# Patient Record
Sex: Female | Born: 1990 | Race: White | Hispanic: No | Marital: Single | Smoking: Current every day smoker
Health system: Southern US, Community
[De-identification: ages and names within clinical notes are randomized; demographics above are authoritative.]

## PROBLEM LIST (undated history)

## (undated) ENCOUNTER — Emergency Department (HOSPITAL_COMMUNITY): Payer: Managed Care, Other (non HMO) | Source: Home / Self Care

## (undated) VITALS — BP 90/61 | HR 84 | Temp 97.4°F | Resp 16 | Ht 62.0 in | Wt 106.0 lb

## (undated) DIAGNOSIS — F319 Bipolar disorder, unspecified: Secondary | ICD-10-CM

## (undated) DIAGNOSIS — G8929 Other chronic pain: Secondary | ICD-10-CM

## (undated) DIAGNOSIS — N200 Calculus of kidney: Secondary | ICD-10-CM

## (undated) DIAGNOSIS — Z765 Malingerer [conscious simulation]: Secondary | ICD-10-CM

## (undated) DIAGNOSIS — K589 Irritable bowel syndrome without diarrhea: Secondary | ICD-10-CM

## (undated) DIAGNOSIS — N83209 Unspecified ovarian cyst, unspecified side: Secondary | ICD-10-CM

## (undated) DIAGNOSIS — B2790 Infectious mononucleosis, unspecified without complication: Secondary | ICD-10-CM

## (undated) DIAGNOSIS — R87619 Unspecified abnormal cytological findings in specimens from cervix uteri: Secondary | ICD-10-CM

## (undated) DIAGNOSIS — N3010 Interstitial cystitis (chronic) without hematuria: Secondary | ICD-10-CM

## (undated) DIAGNOSIS — N730 Acute parametritis and pelvic cellulitis: Secondary | ICD-10-CM

## (undated) DIAGNOSIS — M1990 Unspecified osteoarthritis, unspecified site: Secondary | ICD-10-CM

## (undated) DIAGNOSIS — R102 Pelvic and perineal pain: Secondary | ICD-10-CM

## (undated) DIAGNOSIS — IMO0002 Reserved for concepts with insufficient information to code with codable children: Secondary | ICD-10-CM

## (undated) DIAGNOSIS — D649 Anemia, unspecified: Secondary | ICD-10-CM

## (undated) DIAGNOSIS — B977 Papillomavirus as the cause of diseases classified elsewhere: Secondary | ICD-10-CM

## (undated) DIAGNOSIS — M797 Fibromyalgia: Secondary | ICD-10-CM

## (undated) DIAGNOSIS — F329 Major depressive disorder, single episode, unspecified: Secondary | ICD-10-CM

## (undated) DIAGNOSIS — F419 Anxiety disorder, unspecified: Secondary | ICD-10-CM

## (undated) DIAGNOSIS — A0472 Enterocolitis due to Clostridium difficile, not specified as recurrent: Secondary | ICD-10-CM

## (undated) DIAGNOSIS — A749 Chlamydial infection, unspecified: Secondary | ICD-10-CM

## (undated) DIAGNOSIS — F32A Depression, unspecified: Secondary | ICD-10-CM

## (undated) HISTORY — PX: LAPAROSCOPIC CHOLECYSTECTOMY: SUR755

## (undated) HISTORY — DX: Depression, unspecified: F32.A

## (undated) HISTORY — PX: TONSILLECTOMY: SUR1361

## (undated) HISTORY — PX: WISDOM TOOTH EXTRACTION: SHX21

## (undated) HISTORY — DX: Major depressive disorder, single episode, unspecified: F32.9

## (undated) HISTORY — PX: INNER EAR SURGERY: SHX679

## (undated) HISTORY — PX: COLPOSCOPY: SHX161

## (undated) HISTORY — DX: Unspecified osteoarthritis, unspecified site: M19.90

## (undated) HISTORY — PX: KIDNEY STONE SURGERY: SHX686

## (undated) HISTORY — DX: Anemia, unspecified: D64.9

## (undated) HISTORY — DX: Reserved for concepts with insufficient information to code with codable children: IMO0002

## (undated) HISTORY — DX: Anxiety disorder, unspecified: F41.9

---
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
## 2008-08-26 ENCOUNTER — Encounter: Payer: Self-pay | Admitting: Internal Medicine

## 2008-09-15 ENCOUNTER — Encounter: Payer: Self-pay | Admitting: Internal Medicine

## 2009-03-09 ENCOUNTER — Encounter: Payer: Self-pay | Admitting: Internal Medicine

## 2009-03-09 LAB — CONVERTED CEMR LAB
HCT: 39.3 %
Hemoglobin: 13.2 g/dL
WBC, blood: 5.8 10*3/uL

## 2009-05-18 ENCOUNTER — Ambulatory Visit: Payer: Self-pay | Admitting: Internal Medicine

## 2009-05-18 ENCOUNTER — Telehealth: Payer: Self-pay | Admitting: Internal Medicine

## 2009-05-18 DIAGNOSIS — K589 Irritable bowel syndrome without diarrhea: Secondary | ICD-10-CM | POA: Insufficient documentation

## 2009-05-18 DIAGNOSIS — R634 Abnormal weight loss: Secondary | ICD-10-CM | POA: Insufficient documentation

## 2009-05-18 DIAGNOSIS — F411 Generalized anxiety disorder: Secondary | ICD-10-CM | POA: Insufficient documentation

## 2009-05-26 ENCOUNTER — Telehealth (INDEPENDENT_AMBULATORY_CARE_PROVIDER_SITE_OTHER): Payer: Self-pay | Admitting: *Deleted

## 2009-05-26 LAB — CONVERTED CEMR LAB
ALT: 25 units/L (ref 0–35)
BUN: 11 mg/dL (ref 6–23)
Basophils Absolute: 0 10*3/uL (ref 0.0–0.1)
Bilirubin, Direct: 0.1 mg/dL (ref 0.0–0.3)
CO2: 24 meq/L (ref 19–32)
Eosinophils Absolute: 0.2 10*3/uL (ref 0.0–0.7)
Free T4: 0.9 ng/dL (ref 0.6–1.6)
GFR calc non Af Amer: 113.27 mL/min (ref >60)
Glucose, Bld: 77 mg/dL (ref 70–99)
HCT: 35.3 % — ABNORMAL LOW (ref 36.0–46.0)
Iron: 102 ug/dL (ref 42–145)
Lymphs Abs: 2.3 10*3/uL (ref 0.7–4.0)
MCHC: 33.3 g/dL (ref 30.0–36.0)
MCV: 96.6 fL (ref 78.0–100.0)
Monocytes Absolute: 0.3 10*3/uL (ref 0.1–1.0)
Platelets: 188 10*3/uL (ref 150.0–400.0)
Potassium: 3.6 meq/L (ref 3.5–5.1)
RDW: 11.4 % — ABNORMAL LOW (ref 11.5–14.6)
Total Bilirubin: 1.2 mg/dL (ref 0.3–1.2)

## 2009-05-27 ENCOUNTER — Encounter: Payer: Self-pay | Admitting: Internal Medicine

## 2009-09-02 ENCOUNTER — Emergency Department (HOSPITAL_COMMUNITY): Admission: EM | Admit: 2009-09-02 | Discharge: 2009-09-02 | Payer: Self-pay | Admitting: Emergency Medicine

## 2010-01-20 ENCOUNTER — Emergency Department (HOSPITAL_COMMUNITY): Admission: EM | Admit: 2010-01-20 | Discharge: 2010-01-21 | Payer: Self-pay | Admitting: Emergency Medicine

## 2010-02-03 ENCOUNTER — Encounter: Payer: Self-pay | Admitting: Internal Medicine

## 2010-02-10 ENCOUNTER — Emergency Department (HOSPITAL_COMMUNITY): Admission: EM | Admit: 2010-02-10 | Discharge: 2010-02-11 | Payer: Self-pay | Admitting: Pediatric Emergency Medicine

## 2010-03-09 ENCOUNTER — Emergency Department (HOSPITAL_COMMUNITY): Admission: EM | Admit: 2010-03-09 | Discharge: 2010-03-10 | Payer: Self-pay | Admitting: Emergency Medicine

## 2010-03-10 ENCOUNTER — Inpatient Hospital Stay (HOSPITAL_COMMUNITY): Admission: AD | Admit: 2010-03-10 | Discharge: 2010-03-11 | Payer: Self-pay | Admitting: Psychiatry

## 2010-03-10 ENCOUNTER — Ambulatory Visit: Payer: Self-pay | Admitting: Psychiatry

## 2010-03-17 ENCOUNTER — Ambulatory Visit (HOSPITAL_COMMUNITY): Payer: Self-pay | Admitting: Psychiatry

## 2010-04-01 ENCOUNTER — Ambulatory Visit (HOSPITAL_COMMUNITY): Payer: Self-pay | Admitting: Psychiatry

## 2010-04-06 ENCOUNTER — Ambulatory Visit (HOSPITAL_COMMUNITY): Payer: Self-pay | Admitting: Psychiatry

## 2010-04-11 ENCOUNTER — Ambulatory Visit (HOSPITAL_COMMUNITY): Payer: Self-pay | Admitting: Psychiatry

## 2010-04-19 ENCOUNTER — Ambulatory Visit (HOSPITAL_COMMUNITY): Payer: Self-pay | Admitting: Psychiatry

## 2010-04-22 ENCOUNTER — Ambulatory Visit (HOSPITAL_COMMUNITY): Payer: Self-pay | Admitting: Psychiatry

## 2010-04-25 ENCOUNTER — Ambulatory Visit (HOSPITAL_COMMUNITY): Payer: Self-pay | Admitting: Psychiatry

## 2010-04-26 ENCOUNTER — Emergency Department (HOSPITAL_COMMUNITY): Admission: EM | Admit: 2010-04-26 | Discharge: 2010-04-26 | Payer: Self-pay | Admitting: Emergency Medicine

## 2010-05-02 ENCOUNTER — Ambulatory Visit (HOSPITAL_COMMUNITY): Payer: Self-pay | Admitting: Psychiatry

## 2010-05-09 ENCOUNTER — Ambulatory Visit (HOSPITAL_COMMUNITY): Payer: Self-pay | Admitting: Psychiatry

## 2010-05-12 ENCOUNTER — Ambulatory Visit (HOSPITAL_COMMUNITY): Payer: Self-pay | Admitting: Psychiatry

## 2010-05-16 ENCOUNTER — Ambulatory Visit (HOSPITAL_COMMUNITY): Payer: Self-pay | Admitting: Psychiatry

## 2010-05-20 ENCOUNTER — Ambulatory Visit (HOSPITAL_COMMUNITY): Payer: Self-pay | Admitting: Psychiatry

## 2010-05-24 ENCOUNTER — Ambulatory Visit (HOSPITAL_COMMUNITY): Payer: Self-pay | Admitting: Psychiatry

## 2010-05-26 ENCOUNTER — Ambulatory Visit (HOSPITAL_COMMUNITY): Payer: Self-pay | Admitting: Psychiatry

## 2010-05-27 ENCOUNTER — Ambulatory Visit (HOSPITAL_COMMUNITY): Payer: Self-pay | Admitting: Psychiatry

## 2010-05-30 ENCOUNTER — Ambulatory Visit (HOSPITAL_COMMUNITY): Payer: Self-pay | Admitting: Psychiatry

## 2010-06-07 ENCOUNTER — Emergency Department (HOSPITAL_COMMUNITY): Admission: EM | Admit: 2010-06-07 | Discharge: 2010-06-08 | Payer: Self-pay | Admitting: Emergency Medicine

## 2010-06-07 ENCOUNTER — Ambulatory Visit (HOSPITAL_COMMUNITY): Payer: Self-pay | Admitting: Psychiatry

## 2010-06-09 ENCOUNTER — Ambulatory Visit (HOSPITAL_COMMUNITY): Payer: Self-pay | Admitting: Psychiatry

## 2010-06-14 ENCOUNTER — Ambulatory Visit (HOSPITAL_COMMUNITY): Payer: Self-pay | Admitting: Psychiatry

## 2010-06-16 ENCOUNTER — Ambulatory Visit (HOSPITAL_COMMUNITY): Payer: Self-pay | Admitting: Psychiatry

## 2010-06-20 ENCOUNTER — Ambulatory Visit (HOSPITAL_COMMUNITY): Payer: Self-pay | Admitting: Psychiatry

## 2010-06-21 ENCOUNTER — Ambulatory Visit (HOSPITAL_COMMUNITY): Payer: Self-pay | Admitting: Psychiatry

## 2010-06-23 ENCOUNTER — Ambulatory Visit (HOSPITAL_COMMUNITY): Payer: Self-pay | Admitting: Psychiatry

## 2010-07-04 ENCOUNTER — Ambulatory Visit: Payer: Self-pay | Admitting: Internal Medicine

## 2010-07-04 ENCOUNTER — Ambulatory Visit: Payer: Self-pay | Admitting: Cardiology

## 2010-07-04 DIAGNOSIS — R51 Headache: Secondary | ICD-10-CM | POA: Insufficient documentation

## 2010-07-04 DIAGNOSIS — R519 Headache, unspecified: Secondary | ICD-10-CM | POA: Insufficient documentation

## 2010-07-08 ENCOUNTER — Telehealth: Payer: Self-pay | Admitting: Internal Medicine

## 2010-07-15 ENCOUNTER — Ambulatory Visit (HOSPITAL_COMMUNITY): Payer: Self-pay | Admitting: Psychiatry

## 2010-07-17 ENCOUNTER — Emergency Department (HOSPITAL_COMMUNITY): Admission: EM | Admit: 2010-07-17 | Discharge: 2010-07-17 | Payer: Self-pay | Admitting: Emergency Medicine

## 2010-07-19 ENCOUNTER — Telehealth: Payer: Self-pay | Admitting: Internal Medicine

## 2010-07-25 ENCOUNTER — Ambulatory Visit (HOSPITAL_COMMUNITY): Payer: Self-pay | Admitting: Psychiatry

## 2010-08-15 ENCOUNTER — Emergency Department (HOSPITAL_COMMUNITY)
Admission: EM | Admit: 2010-08-15 | Discharge: 2010-08-16 | Payer: Self-pay | Source: Home / Self Care | Admitting: Emergency Medicine

## 2010-08-19 ENCOUNTER — Ambulatory Visit (HOSPITAL_COMMUNITY): Payer: Self-pay | Admitting: Psychiatry

## 2010-08-26 ENCOUNTER — Ambulatory Visit (HOSPITAL_COMMUNITY): Payer: Self-pay | Admitting: Psychiatry

## 2010-09-16 ENCOUNTER — Ambulatory Visit (HOSPITAL_COMMUNITY): Payer: Self-pay | Admitting: Psychiatry

## 2010-09-20 ENCOUNTER — Ambulatory Visit: Admit: 2010-09-20 | Payer: Self-pay | Admitting: Internal Medicine

## 2010-09-20 ENCOUNTER — Encounter: Payer: Self-pay | Admitting: Internal Medicine

## 2010-09-20 ENCOUNTER — Encounter (INDEPENDENT_AMBULATORY_CARE_PROVIDER_SITE_OTHER): Payer: Self-pay | Admitting: *Deleted

## 2010-09-20 ENCOUNTER — Emergency Department (HOSPITAL_COMMUNITY)
Admission: EM | Admit: 2010-09-20 | Discharge: 2010-09-20 | Payer: Self-pay | Source: Home / Self Care | Admitting: Emergency Medicine

## 2010-09-20 ENCOUNTER — Telehealth (INDEPENDENT_AMBULATORY_CARE_PROVIDER_SITE_OTHER): Payer: Self-pay | Admitting: *Deleted

## 2010-09-20 ENCOUNTER — Ambulatory Visit
Admission: RE | Admit: 2010-09-20 | Discharge: 2010-09-20 | Payer: Self-pay | Source: Home / Self Care | Attending: Internal Medicine | Admitting: Internal Medicine

## 2010-09-20 DIAGNOSIS — N390 Urinary tract infection, site not specified: Secondary | ICD-10-CM | POA: Insufficient documentation

## 2010-09-20 LAB — CONVERTED CEMR LAB
Ketones, urine, test strip: NEGATIVE
Nitrite: POSITIVE
RBC / HPF: >50 — AB (ref <3)
Specific Gravity, Urine: 1.02
Squamous Epithelial / LPF: NONE SEEN /lpf
Urobilinogen, UA: 0.2
WBC, UA: >50 cells/hpf — AB (ref <3)

## 2010-09-21 ENCOUNTER — Encounter: Payer: Self-pay | Admitting: Internal Medicine

## 2010-09-23 ENCOUNTER — Ambulatory Visit (HOSPITAL_COMMUNITY)
Admission: RE | Admit: 2010-09-23 | Discharge: 2010-09-23 | Payer: Self-pay | Source: Home / Self Care | Attending: Psychiatry | Admitting: Psychiatry

## 2010-09-27 ENCOUNTER — Emergency Department (HOSPITAL_COMMUNITY)
Admission: EM | Admit: 2010-09-27 | Discharge: 2010-09-27 | Payer: Self-pay | Source: Home / Self Care | Admitting: Emergency Medicine

## 2010-09-30 ENCOUNTER — Ambulatory Visit (HOSPITAL_COMMUNITY)
Admission: RE | Admit: 2010-09-30 | Discharge: 2010-09-30 | Payer: Self-pay | Source: Home / Self Care | Attending: Psychiatry | Admitting: Psychiatry

## 2010-10-04 ENCOUNTER — Ambulatory Visit (HOSPITAL_COMMUNITY): Admit: 2010-10-04 | Payer: Self-pay | Admitting: Psychiatry

## 2010-10-12 ENCOUNTER — Emergency Department (HOSPITAL_COMMUNITY)
Admission: EM | Admit: 2010-10-12 | Discharge: 2010-10-12 | Payer: Self-pay | Source: Home / Self Care | Admitting: Emergency Medicine

## 2010-10-14 ENCOUNTER — Telehealth: Payer: Self-pay | Admitting: Internal Medicine

## 2010-10-14 ENCOUNTER — Ambulatory Visit (HOSPITAL_COMMUNITY)
Admission: RE | Admit: 2010-10-14 | Discharge: 2010-10-14 | Payer: Self-pay | Source: Home / Self Care | Attending: Psychiatry | Admitting: Psychiatry
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
## 2010-10-18 NOTE — Progress Notes (Signed)
----   Converted from flag ---- ---- 07/08/2010 11:57 AM, Army Fossa CMA wrote: pts number has been disconnected  ---- 07/05/2010 1:13 PM, Jose E. Paz MD wrote: please check on the patient ------------------------------

## 2010-10-18 NOTE — Progress Notes (Signed)
Summary: NEUROLOGIC REFERRAL  Phone Note Outgoing Call   Call placed by: Magdalen Spatz [facility],  July 19, 2010 12:08 PM Call placed to: Specialist Summary of Call: IN REFERENCE TO NEUROLOGIC REFERRAL.......Marland KitchenPER MY CALL TO [facility], PT NOSHOWED FOR HER APPT WHICH WAS WITHIN THE 10 DAYS YOU REQUESTED.  ATTEMPTED TO CONTACT PT MULTIPLE TIMES, MESSAGE WAS LEFT ON MOTHERS VM, AND PT WAS ALSO NOTIFIED BY MAIL OF HER APPT.  TODAY, I TRIED TO CONTACT PT, HOME PHONE NOW DISCONNECTED & CELL HAS VM NOT ACTIVATED.     Initial call taken by: Magdalen Spatz [facility],  July 19, 2010 12:08 PM  Follow-up for Phone Call        noted, not much else we can do Follow-up by: [facility] E. Paz MD,  July 19, 2010 5:08 PM

## 2010-10-18 NOTE — Miscellaneous (Signed)
Summary: Vaccine Record/[facility]  Vaccine Record/[facility]   Imported By: Lanelle Bal 02/10/2010 10:34:10  _____________________________________________________________________  External Attachment:    Type:   Image     Comment:   External Document

## 2010-10-18 NOTE — Letter (Signed)
Summary: Records Dated 05-14-04 thru 06-02-08/[facility]  Records Dated 05-14-04 thru 06-02-08/[facility]   Imported By: Lanelle Bal 02/10/2010 10:38:21  _____________________________________________________________________  External Attachment:    Type:   Image     Comment:   External Document

## 2010-10-20 NOTE — Progress Notes (Signed)
Summary: ER advisement  Phone Note Call from Patient   Summary of Call: I spoke with patient and fiancee about her symptoms. He called noting that they were worse and everytime she vomits she passes out. He notes that she passed out at the pharmacy. I advised the fiancee that he should call 911 and have the patient taken to the ER. He then noted that she was up and moving around and put the patient on the phone. I advised the patient that she should go to the ER immediately for eval and more extensive testing than we could do here. Initial call taken by: Lucious Groves CMA,  September 20, 2010 4:11 PM  Follow-up for Phone Call        agree, please check on her today Jose E. Paz MD  September 21, 2010 1:13 PM of  Additional Follow-up for Phone Call Additional follow up Details #1::        Attempted to call the patient and home number is disconnected/not in service. Tried an old number in idx and spanish speaking man notified me that I had the wrong number. Additional Follow-up by: Lucious Groves CMA,  September 21, 2010 2:46 PM    Additional Follow-up for Phone Call Additional follow up Details #2::    In [facility] I do not see where pt was seen in the ER. Army Fossa CMA  September 21, 2010 3:36 PM  I checked again myself--- no records of any visit to the Er yesterday. unable to contact the patient Jose E. Paz MD  September 21, 2010 6:01 PM v

## 2010-10-20 NOTE — Letter (Signed)
Summary: Out of Work  [facility] [employer]  [address] [place], [place] [address]   Phone: [phone]  Fax: [phone]    September 20, 2010   Employee:  Vladimir Crofts    To Whom It May Concern:   For Medical reasons, please excuse the above named employee from work for the following dates:  Start:   September 20, 2010  End:   September 20, 2010  If you need additional information, please feel free to contact our office.         Sincerely,    Army Fossa CMA

## 2010-10-20 NOTE — Assessment & Plan Note (Signed)
Summary: UTI OR KIDNEY STONE///SPH   Vital Signs:  Patient profile:   20 year old female Menstrual status:  regular Weight:      94 pounds Temp:     98.5 degrees F oral Pulse rate:   72 / minute Pulse rhythm:   regular BP sitting:   120 / 70  (left arm) Cuff size:   regular  Vitals Entered By: Army Fossa CMA (September 20, 2010 2:49 PM) CC: UTI or Kidney Stone Comments very painful to urinate lower back pain  x 1 day [facility]    History of Present Illness: here w/ her fiance 1 day h/o severe dysuria ("feels like razor blades when i urinate") , lower abd pain, lower back pain urine darker than usual reports a h/o kidney stones  (hospital chart reviewed: no CTs or documentation found) she went to the ER today w/ above sx but decided came hear instead b/c the 2 hour wait time at the ER  Current Medications (verified): 1)  Bcp 2)  Depakote 500 Mg Tbec (Divalproex Sodium) .... Qd 3)  Promethazine Hcl 25 Mg Tabs (Promethazine Hcl) .... At Bedtime 4)  Ambien 10 Mg Tabs (Zolpidem Tartrate) .... At Bedtime As Needed 5)  Ketorolac Tromethamine 10 Mg Tabs (Ketorolac Tromethamine) .... One By Mouth Every 6 Hours As Needed For Headache 6)  Lamotrigine 100 Mg Tabs (Lamotrigine) .... Qd  Allergies (verified): 1)  ! Zithromax  Past History:  Past Medical History: Reviewed history from 07/04/2010 and no changes required. IBS Anxiety-- sees psych  G0  Past Surgical History: Reviewed history from 05/18/2009 and no changes required. 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 GI:  Denies diarrhea and vomiting; (+) nausea  last BM yesterday. GU:  + lower abd dyscomfort LMP--? 9 months  on Depo shots .  Physical  Exam  General:  alert and underweight appearing.  non toxic  Eyes:  not pale , no icteric  Lungs:  normal respiratory effort, no intercostal retractions, no accessory muscle use, and normal breath sounds.   Heart:  normal rate, regular rhythm, and no murmur.   Abdomen:  not distended and, soft, quite tender in the lower abdomen ( suprapubic> RLQ or LLQ). no mass or rebound. Mild to moderate tenderness to percussion at the CVA bilaterally   Impression & Recommendations:  Problem # 1:  UTI (ICD-599.0) Assessment New I am concerned about the intensity of tenderness to palpation in the lower abdomen however she has a very good story and a supportive UA for the diagnosis of UTI . Other  etiologies are much less likely ( kidney stone? Appendicitis?) Urine pregnancy test is negative Plan: Rest, fluids, levaquin , pyridium I checked and there is no interaction between the prescribed medications and Depakote, lamodrigine   Her updated medication list for this problem includes:    Levaquin 500 Mg Tabs (Levofloxacin) ..... One by mouth daily for one week    Pyridium 200 Mg Tabs (Phenazopyridine hcl) ..... One by mouth 3 times a day for 2 days  Orders: UA Dipstick w/o Micro (automated)  (81003) T-Urine Microscopic (16109-60454) T-Culture, Urine (09811-91478) Urine Pregnancy Test  (29562)  Complete Medication List: 1)  Bcp  2)  Depakote 500 Mg Tbec (Divalproex sodium) .Marland KitchenMarland KitchenMarland Kitchen  Qd 3)  Promethazine Hcl 25 Mg Tabs (Promethazine hcl) .... At bedtime 4)  Ambien 10 Mg Tabs (Zolpidem tartrate) .... At bedtime as needed 5)  Ketorolac Tromethamine 10 Mg Tabs (Ketorolac tromethamine) .... One by mouth every 6 hours as needed for headache 6)  Lamotrigine 100 Mg Tabs (Lamotrigine) .... Qd 7)  Levaquin 500 Mg Tabs (Levofloxacin) .... One by mouth daily for one week 8)  Pyridium 200 Mg Tabs (Phenazopyridine hcl) .... One by mouth 3 times a day for 2 days  Patient Instructions: 1)  rest 2)  Fluids 3)   Her readings for 3 days 4)  Levaquin for one week 5)  For pain take Tylenol 6)  Go immediately to the ER or call us   if the symptoms are getting worse , you have pain in the right lower side of the abdomen, you have high fever or you are not getting better in the next 48 hours Prescriptions: PYRIDIUM 200 MG TABS (PHENAZOPYRIDINE HCL) One by mouth 3 times a day for 2 days  #6 x 0   Entered and Authorized by:   Nolon Rod. Quin Mathenia MD   Signed by:   Nolon Rod. Saniyah Mondesir MD on 09/20/2010   Method used:   Print then Give to Patient   RxID:   [id number] LEVAQUIN 500 MG TABS (LEVOFLOXACIN) one by mouth daily for one week  #7 x 0   Entered and Authorized by:   Nolon Rod. Taeden Geller MD   Signed by:   Nolon Rod. Havanna Groner MD on 09/20/2010   Method used:   Print then Give to Patient   RxID:   [id number]    Orders Added: 1)  UA Dipstick w/o Micro (automated)  [81003] 2)  T-Urine Microscopic [84696-29528] 3)  T-Culture, Urine [41324-40102] 4)  Urine Pregnancy Test  [81025] 5)  Est. Patient Level IV [72536]    Laboratory Results   Urine Tests    Routine Urinalysis   Color: orange Appearance: Cloudy Glucose: negative   (Normal Range: Negative) Bilirubin: negative   (Normal Range: Negative) Ketone: negative   (Normal Range: Negative) Spec. Gravity: 1.020   (Normal Range: 1.003-1.035) Blood: large   (Normal Range: Negative) pH: 7.5   (Normal Range: 5.0-8.0) Protein: trace   (Normal Range: Negative) Urobilinogen: 0.2   (Normal Range: 0-1) Nitrite: positive   (Normal Range: Negative) Leukocyte Esterace: large   (Normal Range: Negative)    Urine HCG: negative Comments: Army Fossa CMA  September 20, 2010 2:53 PM

## 2010-10-21 ENCOUNTER — Ambulatory Visit (HOSPITAL_COMMUNITY): Admit: 2010-10-21 | Payer: Self-pay | Admitting: Psychiatry

## 2010-10-21 ENCOUNTER — Encounter (HOSPITAL_COMMUNITY): Payer: Self-pay | Admitting: Psychiatry
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
## 2010-11-09 ENCOUNTER — Emergency Department (INDEPENDENT_AMBULATORY_CARE_PROVIDER_SITE_OTHER): Payer: Managed Care, Other (non HMO)

## 2010-11-09 ENCOUNTER — Emergency Department (HOSPITAL_BASED_OUTPATIENT_CLINIC_OR_DEPARTMENT_OTHER)
Admission: EM | Admit: 2010-11-09 | Discharge: 2010-11-09 | Disposition: A | Discharge status: Home / Self Care | Payer: Managed Care, Other (non HMO) | Attending: Emergency Medicine | Admitting: Emergency Medicine

## 2010-11-09 DIAGNOSIS — M549 Dorsalgia, unspecified: Secondary | ICD-10-CM | POA: Insufficient documentation

## 2010-11-09 DIAGNOSIS — N23 Unspecified renal colic: Secondary | ICD-10-CM | POA: Insufficient documentation

## 2010-11-09 DIAGNOSIS — R109 Unspecified abdominal pain: Secondary | ICD-10-CM

## 2010-11-09 DIAGNOSIS — F3289 Other specified depressive episodes: Secondary | ICD-10-CM | POA: Insufficient documentation

## 2010-11-09 DIAGNOSIS — F329 Major depressive disorder, single episode, unspecified: Secondary | ICD-10-CM | POA: Insufficient documentation

## 2010-11-09 DIAGNOSIS — N390 Urinary tract infection, site not specified: Secondary | ICD-10-CM | POA: Insufficient documentation

## 2010-11-09 DIAGNOSIS — R112 Nausea with vomiting, unspecified: Secondary | ICD-10-CM | POA: Insufficient documentation

## 2010-11-09 DIAGNOSIS — R197 Diarrhea, unspecified: Secondary | ICD-10-CM | POA: Insufficient documentation

## 2010-11-09 DIAGNOSIS — N1330 Unspecified hydronephrosis: Secondary | ICD-10-CM

## 2010-11-09 LAB — CBC
HCT: 33.8 % — ABNORMAL LOW (ref 36.0–46.0)
Hemoglobin: 12 g/dL (ref 12.0–15.0)
MCH: 32.3 pg (ref 26.0–34.0)
MCHC: 35.5 g/dL (ref 30.0–36.0)
MCV: 90.9 fL (ref 78.0–100.0)
Platelets: 235 10*3/uL (ref 150–400)
RBC: 3.72 MIL/uL — ABNORMAL LOW (ref 3.87–5.11)
RDW: 11.5 % (ref 11.5–15.5)
WBC: 8.9 10*3/uL (ref 4.0–10.5)

## 2010-11-09 LAB — URINALYSIS, ROUTINE W REFLEX MICROSCOPIC
Bilirubin Urine: NEGATIVE
Ketones, ur: NEGATIVE mg/dL
Nitrite: POSITIVE — AB
Protein, ur: NEGATIVE mg/dL
Specific Gravity, Urine: 1.007 (ref 1.005–1.030)
Urine Glucose, Fasting: NEGATIVE mg/dL
Urobilinogen, UA: 1 mg/dL (ref 0.0–1.0)
pH: 6 (ref 5.0–8.0)

## 2010-11-09 LAB — BASIC METABOLIC PANEL
BUN: 18 mg/dL (ref 6–23)
CO2: 21 mEq/L (ref 19–32)
Calcium: 9.4 mg/dL (ref 8.4–10.5)
Chloride: 110 mEq/L (ref 96–112)
Creatinine, Ser: 0.8 mg/dL (ref 0.4–1.2)
GFR calc Af Amer: >60 mL/min (ref >60)
GFR calc non Af Amer: >60 mL/min (ref >60)
Glucose, Bld: 94 mg/dL (ref 70–99)
Potassium: 3.8 mEq/L (ref 3.5–5.1)
Sodium: 139 mEq/L (ref 135–145)

## 2010-11-09 LAB — URINE MICROSCOPIC-ADD ON

## 2010-11-09 LAB — DIFFERENTIAL
Basophils Absolute: 0 10*3/uL (ref 0.0–0.1)
Basophils Relative: 0 % (ref 0–1)
Eosinophils Absolute: 0.2 10*3/uL (ref 0.0–0.7)
Eosinophils Relative: 2 % (ref 0–5)
Lymphocytes Relative: 22 % (ref 12–46)
Lymphs Abs: 1.9 10*3/uL (ref 0.7–4.0)
Monocytes Absolute: 0.6 10*3/uL (ref 0.1–1.0)
Monocytes Relative: 7 % (ref 3–12)
Neutro Abs: 6.1 10*3/uL (ref 1.7–7.7)
Neutrophils Relative %: 69 % (ref 43–77)

## 2010-11-09 LAB — PREGNANCY, URINE: Preg Test, Ur: NEGATIVE

## 2010-11-10 LAB — URINE CULTURE
Colony Count: NO GROWTH
Culture  Setup Time: 201202230131
Culture: NO GROWTH

## 2010-11-28 LAB — CBC
HCT: 37.8 % (ref 36.0–46.0)
Hemoglobin: 12.6 g/dL (ref 12.0–15.0)
MCH: 31.7 pg (ref 26.0–34.0)
MCHC: 33.3 g/dL (ref 30.0–36.0)
MCV: 95.2 fL (ref 78.0–100.0)
Platelets: 226 10*3/uL (ref 150–400)
RBC: 3.97 MIL/uL (ref 3.87–5.11)
RDW: 12.1 % (ref 11.5–15.5)
WBC: 12.5 10*3/uL — ABNORMAL HIGH (ref 4.0–10.5)

## 2010-11-28 LAB — URINALYSIS, ROUTINE W REFLEX MICROSCOPIC
Glucose, UA: NEGATIVE mg/dL
Ketones, ur: >80 mg/dL — AB
Nitrite: POSITIVE — AB
Protein, ur: >300 mg/dL — AB
Specific Gravity, Urine: 1.021 (ref 1.005–1.030)
Urobilinogen, UA: 1 mg/dL (ref 0.0–1.0)
pH: 6 (ref 5.0–8.0)

## 2010-11-28 LAB — DIFFERENTIAL
Basophils Absolute: 0.1 10*3/uL (ref 0.0–0.1)
Basophils Relative: 0 % (ref 0–1)
Eosinophils Absolute: 0.1 10*3/uL (ref 0.0–0.7)
Eosinophils Relative: 1 % (ref 0–5)
Lymphocytes Relative: 18 % (ref 12–46)
Lymphs Abs: 2.2 10*3/uL (ref 0.7–4.0)
Monocytes Absolute: 0.9 10*3/uL (ref 0.1–1.0)
Monocytes Relative: 7 % (ref 3–12)
Neutro Abs: 9.2 10*3/uL — ABNORMAL HIGH (ref 1.7–7.7)
Neutrophils Relative %: 74 % (ref 43–77)

## 2010-11-28 LAB — URINE MICROSCOPIC-ADD ON

## 2010-11-28 LAB — POCT PREGNANCY, URINE: Preg Test, Ur: NEGATIVE

## 2010-11-28 LAB — POCT I-STAT, CHEM 8
BUN: 14 mg/dL (ref 6–23)
Calcium, Ion: 1.12 mmol/L (ref 1.12–1.32)
Chloride: 107 mEq/L (ref 96–112)
Creatinine, Ser: 0.9 mg/dL (ref 0.4–1.2)
Glucose, Bld: 94 mg/dL (ref 70–99)
HCT: 41 % (ref 36.0–46.0)
Hemoglobin: 13.9 g/dL (ref 12.0–15.0)
Potassium: 3.4 mEq/L — ABNORMAL LOW (ref 3.5–5.1)
Sodium: 141 mEq/L (ref 135–145)
TCO2: 23 mmol/L (ref 0–100)

## 2010-11-30 LAB — URINALYSIS, ROUTINE W REFLEX MICROSCOPIC
Bilirubin Urine: NEGATIVE
Glucose, UA: NEGATIVE mg/dL
Hgb urine dipstick: NEGATIVE
Ketones, ur: NEGATIVE mg/dL
Nitrite: NEGATIVE
Protein, ur: NEGATIVE mg/dL
Specific Gravity, Urine: 1.017 (ref 1.005–1.030)
Urobilinogen, UA: 1 mg/dL (ref 0.0–1.0)
pH: 7 (ref 5.0–8.0)

## 2010-11-30 LAB — GLUCOSE, CAPILLARY: Glucose-Capillary: 90 mg/dL (ref 70–99)

## 2010-11-30 LAB — BASIC METABOLIC PANEL
BUN: 9 mg/dL (ref 6–23)
Creatinine, Ser: 0.66 mg/dL (ref 0.4–1.2)
GFR calc Af Amer: >60 mL/min (ref >60)
GFR calc non Af Amer: >60 mL/min (ref >60)
Potassium: 3.3 mEq/L — ABNORMAL LOW (ref 3.5–5.1)

## 2010-11-30 LAB — CBC
Platelets: 186 10*3/uL (ref 150–400)
RDW: 12.5 % (ref 11.5–15.5)
WBC: 2.9 10*3/uL — ABNORMAL LOW (ref 4.0–10.5)

## 2010-12-01 LAB — POCT I-STAT, CHEM 8
Calcium, Ion: 1.17 mmol/L (ref 1.12–1.32)
Chloride: 107 mEq/L (ref 96–112)
Glucose, Bld: 104 mg/dL — ABNORMAL HIGH (ref 70–99)
HCT: 38 % (ref 36.0–46.0)
Hemoglobin: 12.9 g/dL (ref 12.0–15.0)

## 2010-12-01 LAB — URINALYSIS, ROUTINE W REFLEX MICROSCOPIC
Ketones, ur: 15 mg/dL — AB
Nitrite: NEGATIVE
Protein, ur: NEGATIVE mg/dL
Urobilinogen, UA: 0.2 mg/dL (ref 0.0–1.0)

## 2010-12-01 LAB — URINE CULTURE
Colony Count: NO GROWTH
Culture: NO GROWTH

## 2010-12-01 LAB — POCT PREGNANCY, URINE: Preg Test, Ur: NEGATIVE

## 2010-12-04 LAB — POCT I-STAT, CHEM 8
Calcium, Ion: 1.15 mmol/L (ref 1.12–1.32)
Chloride: 104 mEq/L (ref 96–112)
Glucose, Bld: 98 mg/dL (ref 70–99)
HCT: 39 % (ref 36.0–46.0)

## 2010-12-04 LAB — POCT PREGNANCY, URINE: Preg Test, Ur: NEGATIVE

## 2010-12-04 LAB — RAPID URINE DRUG SCREEN, HOSP PERFORMED
Amphetamines: NOT DETECTED
Benzodiazepines: POSITIVE — AB
Tetrahydrocannabinol: NOT DETECTED

## 2010-12-05 ENCOUNTER — Emergency Department (HOSPITAL_BASED_OUTPATIENT_CLINIC_OR_DEPARTMENT_OTHER)
Admission: EM | Admit: 2010-12-05 | Discharge: 2010-12-05 | Disposition: A | Discharge status: Home / Self Care | Payer: Managed Care, Other (non HMO) | Attending: Emergency Medicine | Admitting: Emergency Medicine

## 2010-12-05 DIAGNOSIS — K589 Irritable bowel syndrome without diarrhea: Secondary | ICD-10-CM | POA: Insufficient documentation

## 2010-12-05 DIAGNOSIS — R109 Unspecified abdominal pain: Secondary | ICD-10-CM | POA: Insufficient documentation

## 2010-12-05 DIAGNOSIS — R319 Hematuria, unspecified: Secondary | ICD-10-CM | POA: Insufficient documentation

## 2010-12-05 DIAGNOSIS — F17200 Nicotine dependence, unspecified, uncomplicated: Secondary | ICD-10-CM | POA: Insufficient documentation

## 2010-12-05 LAB — CBC
HCT: 35.8 % — ABNORMAL LOW (ref 36.0–46.0)
HCT: 33.6 % — ABNORMAL LOW (ref 36.0–46.0)
Hemoglobin: 12.2 g/dL (ref 12.0–15.0)
MCH: 31.8 pg (ref 26.0–34.0)
MCHC: 34.2 g/dL (ref 30.0–36.0)
MCHC: 33.6 g/dL (ref 30.0–36.0)
MCV: 95.6 fL (ref 78.0–100.0)
Platelets: 192 K/uL (ref 150–400)
RBC: 3.74 MIL/uL — ABNORMAL LOW (ref 3.87–5.11)
RDW: 12.1 % (ref 11.5–15.5)
RDW: 11.3 % — ABNORMAL LOW (ref 11.5–15.5)
WBC: 6.8 10*3/uL (ref 4.0–10.5)

## 2010-12-05 LAB — COMPREHENSIVE METABOLIC PANEL
ALT: 21 U/L (ref 0–35)
CO2: 22 mEq/L (ref 19–32)
Calcium: 9.2 mg/dL (ref 8.4–10.5)
Creatinine, Ser: 0.6 mg/dL (ref 0.4–1.2)
GFR calc non Af Amer: >60 mL/min (ref >60)
Glucose, Bld: 79 mg/dL (ref 70–99)
Sodium: 144 mEq/L (ref 135–145)
Total Protein: 7.8 g/dL (ref 6.0–8.3)

## 2010-12-05 LAB — DIFFERENTIAL
Basophils Absolute: 0 K/uL (ref 0.0–0.1)
Basophils Absolute: 0 10*3/uL (ref 0.0–0.1)
Basophils Relative: 1 % (ref 0–1)
Basophils Relative: 0 % (ref 0–1)
Eosinophils Absolute: 0.2 10*3/uL (ref 0.0–0.7)
Eosinophils Relative: 4 % (ref 0–5)
Eosinophils Relative: 1 % (ref 0–5)
Lymphocytes Relative: 50 % — ABNORMAL HIGH (ref 12–46)
Lymphocytes Relative: 23 % (ref 12–46)
Lymphs Abs: 3.4 10*3/uL (ref 0.7–4.0)
Monocytes Absolute: 0.5 K/uL (ref 0.1–1.0)
Monocytes Absolute: 0.5 10*3/uL (ref 0.1–1.0)
Monocytes Relative: 8 % (ref 3–12)
Monocytes Relative: 9 % (ref 3–12)
Neutro Abs: 2.6 K/uL (ref 1.7–7.7)
Neutrophils Relative %: 38 % — ABNORMAL LOW (ref 43–77)

## 2010-12-05 LAB — URINE MICROSCOPIC-ADD ON

## 2010-12-05 LAB — URINALYSIS, ROUTINE W REFLEX MICROSCOPIC
Glucose, UA: NEGATIVE mg/dL
Ketones, ur: NEGATIVE mg/dL
Nitrite: NEGATIVE
Nitrite: NEGATIVE
Protein, ur: 30 mg/dL — AB
Specific Gravity, Urine: 1.014 (ref 1.005–1.030)
Urobilinogen, UA: 1 mg/dL (ref 0.0–1.0)
pH: 7 (ref 5.0–8.0)
pH: 6 (ref 5.0–8.0)

## 2010-12-05 LAB — POCT I-STAT, CHEM 8
BUN: 17 mg/dL (ref 6–23)
Creatinine, Ser: 0.6 mg/dL (ref 0.4–1.2)
Glucose, Bld: 97 mg/dL (ref 70–99)
Hemoglobin: 12.6 g/dL (ref 12.0–15.0)
TCO2: 22 mmol/L (ref 0–100)

## 2010-12-06 LAB — CBC
Hemoglobin: 12.1 g/dL (ref 12.0–15.0)
Platelets: 197 10*3/uL (ref 150–400)
RDW: 12.5 % (ref 11.5–15.5)
WBC: 6.3 10*3/uL (ref 4.0–10.5)

## 2010-12-06 LAB — RAPID URINE DRUG SCREEN, HOSP PERFORMED
Amphetamines: NOT DETECTED
Cocaine: NOT DETECTED
Opiates: NOT DETECTED
Tetrahydrocannabinol: NOT DETECTED

## 2010-12-06 LAB — POCT I-STAT, CHEM 8
BUN: 18 mg/dL (ref 6–23)
Chloride: 107 mEq/L (ref 96–112)
Potassium: 3.7 mEq/L (ref 3.5–5.1)
Sodium: 140 mEq/L (ref 135–145)

## 2010-12-06 LAB — URINE MICROSCOPIC-ADD ON

## 2010-12-06 LAB — URINALYSIS, ROUTINE W REFLEX MICROSCOPIC
Bilirubin Urine: NEGATIVE
Glucose, UA: NEGATIVE mg/dL
Protein, ur: NEGATIVE mg/dL
Urobilinogen, UA: 1 mg/dL (ref 0.0–1.0)

## 2010-12-06 LAB — DIFFERENTIAL
Basophils Absolute: 0.1 10*3/uL (ref 0.0–0.1)
Lymphocytes Relative: 48 % — ABNORMAL HIGH (ref 12–46)
Neutro Abs: 2.6 10*3/uL (ref 1.7–7.7)

## 2010-12-07 LAB — URINE CULTURE
Colony Count: NO GROWTH
Culture  Setup Time: 201203200617
Culture: NO GROWTH

## 2010-12-19 LAB — COMPREHENSIVE METABOLIC PANEL
ALT: 9 U/L (ref 0–35)
AST: 23 U/L (ref 0–37)
Albumin: 3.5 g/dL (ref 3.5–5.2)
Alkaline Phosphatase: 51 U/L (ref 39–117)
BUN: 10 mg/dL (ref 6–23)
Chloride: 103 mEq/L (ref 96–112)
Potassium: 3.4 mEq/L — ABNORMAL LOW (ref 3.5–5.1)
Total Bilirubin: 0.7 mg/dL (ref 0.3–1.2)

## 2010-12-19 LAB — CBC
HCT: 34.3 % — ABNORMAL LOW (ref 36.0–46.0)
Platelets: 261 10*3/uL (ref 150–400)
WBC: 7.7 10*3/uL (ref 4.0–10.5)

## 2010-12-19 LAB — URINALYSIS, ROUTINE W REFLEX MICROSCOPIC
Bilirubin Urine: NEGATIVE
Hgb urine dipstick: NEGATIVE
Protein, ur: NEGATIVE mg/dL
Specific Gravity, Urine: 1.024 (ref 1.005–1.030)
Urobilinogen, UA: 1 mg/dL (ref 0.0–1.0)

## 2010-12-19 LAB — TSH: TSH: 2.008 u[IU]/mL (ref 0.700–6.400)

## 2010-12-19 LAB — URINE MICROSCOPIC-ADD ON

## 2011-01-20 ENCOUNTER — Emergency Department (HOSPITAL_COMMUNITY)
Admission: EM | Admit: 2011-01-20 | Discharge: 2011-01-21 | Disposition: A | Discharge status: Home / Self Care | Payer: Managed Care, Other (non HMO) | Attending: Emergency Medicine | Admitting: Emergency Medicine

## 2011-01-20 DIAGNOSIS — F3289 Other specified depressive episodes: Secondary | ICD-10-CM | POA: Insufficient documentation

## 2011-01-20 DIAGNOSIS — R110 Nausea: Secondary | ICD-10-CM | POA: Insufficient documentation

## 2011-01-20 DIAGNOSIS — R109 Unspecified abdominal pain: Secondary | ICD-10-CM | POA: Insufficient documentation

## 2011-01-20 DIAGNOSIS — K589 Irritable bowel syndrome without diarrhea: Secondary | ICD-10-CM | POA: Insufficient documentation

## 2011-01-20 DIAGNOSIS — Z79899 Other long term (current) drug therapy: Secondary | ICD-10-CM | POA: Insufficient documentation

## 2011-01-20 DIAGNOSIS — R319 Hematuria, unspecified: Secondary | ICD-10-CM | POA: Insufficient documentation

## 2011-01-20 DIAGNOSIS — F329 Major depressive disorder, single episode, unspecified: Secondary | ICD-10-CM | POA: Insufficient documentation

## 2011-01-21 ENCOUNTER — Emergency Department (HOSPITAL_COMMUNITY): Payer: Managed Care, Other (non HMO)

## 2011-01-21 LAB — CBC
MCH: 31.9 pg (ref 26.0–34.0)
MCV: 93.2 fL (ref 78.0–100.0)
Platelets: 194 10*3/uL (ref 150–400)
RDW: 11.8 % (ref 11.5–15.5)

## 2011-01-21 LAB — POCT I-STAT, CHEM 8
BUN: 11 mg/dL (ref 6–23)
Calcium, Ion: 1.1 mmol/L — ABNORMAL LOW (ref 1.12–1.32)
Chloride: 108 mEq/L (ref 96–112)
Creatinine, Ser: 0.7 mg/dL (ref 0.4–1.2)
Glucose, Bld: 87 mg/dL (ref 70–99)

## 2011-01-21 LAB — DIFFERENTIAL
Eosinophils Absolute: 0.1 10*3/uL (ref 0.0–0.7)
Eosinophils Relative: 2 % (ref 0–5)
Lymphs Abs: 2.4 10*3/uL (ref 0.7–4.0)
Monocytes Absolute: 0.5 10*3/uL (ref 0.1–1.0)
Monocytes Relative: 9 % (ref 3–12)

## 2011-01-21 LAB — URINALYSIS, ROUTINE W REFLEX MICROSCOPIC
Bilirubin Urine: NEGATIVE
Glucose, UA: NEGATIVE mg/dL
Hgb urine dipstick: NEGATIVE
Protein, ur: NEGATIVE mg/dL
Specific Gravity, Urine: 1.023 (ref 1.005–1.030)
Urobilinogen, UA: 0.2 mg/dL (ref 0.0–1.0)

## 2011-01-26 ENCOUNTER — Emergency Department (INDEPENDENT_AMBULATORY_CARE_PROVIDER_SITE_OTHER): Payer: Managed Care, Other (non HMO)

## 2011-01-26 ENCOUNTER — Emergency Department (HOSPITAL_BASED_OUTPATIENT_CLINIC_OR_DEPARTMENT_OTHER)
Admission: EM | Admit: 2011-01-26 | Discharge: 2011-01-26 | Disposition: A | Discharge status: Home / Self Care | Payer: Managed Care, Other (non HMO) | Attending: Emergency Medicine | Admitting: Emergency Medicine

## 2011-01-26 DIAGNOSIS — R109 Unspecified abdominal pain: Secondary | ICD-10-CM | POA: Insufficient documentation

## 2011-01-26 DIAGNOSIS — Z79899 Other long term (current) drug therapy: Secondary | ICD-10-CM | POA: Insufficient documentation

## 2011-01-26 DIAGNOSIS — K589 Irritable bowel syndrome without diarrhea: Secondary | ICD-10-CM | POA: Insufficient documentation

## 2011-01-26 DIAGNOSIS — R509 Fever, unspecified: Secondary | ICD-10-CM

## 2011-01-26 DIAGNOSIS — F3289 Other specified depressive episodes: Secondary | ICD-10-CM | POA: Insufficient documentation

## 2011-01-26 DIAGNOSIS — F329 Major depressive disorder, single episode, unspecified: Secondary | ICD-10-CM | POA: Insufficient documentation

## 2011-01-26 DIAGNOSIS — R112 Nausea with vomiting, unspecified: Secondary | ICD-10-CM

## 2011-01-26 LAB — URINALYSIS, ROUTINE W REFLEX MICROSCOPIC
Glucose, UA: NEGATIVE mg/dL
Hgb urine dipstick: NEGATIVE
Protein, ur: NEGATIVE mg/dL
pH: 6.5 (ref 5.0–8.0)

## 2011-01-26 LAB — CBC
HCT: 40.2 % (ref 36.0–46.0)
Hemoglobin: 13.9 g/dL (ref 12.0–15.0)
MCV: 92.2 fL (ref 78.0–100.0)
RBC: 4.36 MIL/uL (ref 3.87–5.11)
RDW: 11.2 % — ABNORMAL LOW (ref 11.5–15.5)
WBC: 5.6 10*3/uL (ref 4.0–10.5)

## 2011-01-26 LAB — RAPID URINE DRUG SCREEN, HOSP PERFORMED
Amphetamines: NOT DETECTED
Tetrahydrocannabinol: NOT DETECTED

## 2011-01-26 LAB — BASIC METABOLIC PANEL
GFR calc non Af Amer: >60 mL/min (ref >60)
Potassium: 4 mEq/L (ref 3.5–5.1)
Sodium: 137 mEq/L (ref 135–145)

## 2011-01-26 LAB — DIFFERENTIAL
Basophils Absolute: 0 10*3/uL (ref 0.0–0.1)
Eosinophils Relative: 3 % (ref 0–5)
Lymphocytes Relative: 44 % (ref 12–46)
Lymphs Abs: 2.5 10*3/uL (ref 0.7–4.0)
Neutro Abs: 2.4 10*3/uL (ref 1.7–7.7)

## 2011-01-26 LAB — PREGNANCY, URINE: Preg Test, Ur: NEGATIVE

## 2011-04-14 ENCOUNTER — Ambulatory Visit: Payer: Self-pay | Admitting: Internal Medicine

## 2011-05-12 ENCOUNTER — Emergency Department (HOSPITAL_COMMUNITY)
Admission: EM | Admit: 2011-05-12 | Discharge: 2011-05-12 | Disposition: A | Discharge status: Home / Self Care | Payer: No Typology Code available for payment source | Attending: Emergency Medicine | Admitting: Emergency Medicine

## 2011-05-12 ENCOUNTER — Emergency Department (HOSPITAL_COMMUNITY): Payer: No Typology Code available for payment source

## 2011-05-12 DIAGNOSIS — F3289 Other specified depressive episodes: Secondary | ICD-10-CM | POA: Insufficient documentation

## 2011-05-12 DIAGNOSIS — F329 Major depressive disorder, single episode, unspecified: Secondary | ICD-10-CM | POA: Insufficient documentation

## 2011-05-12 DIAGNOSIS — Z79899 Other long term (current) drug therapy: Secondary | ICD-10-CM | POA: Insufficient documentation

## 2011-05-12 DIAGNOSIS — R079 Chest pain, unspecified: Secondary | ICD-10-CM | POA: Insufficient documentation

## 2011-05-12 DIAGNOSIS — Y92410 Unspecified street and highway as the place of occurrence of the external cause: Secondary | ICD-10-CM | POA: Insufficient documentation

## 2011-05-12 DIAGNOSIS — S4350XA Sprain of unspecified acromioclavicular joint, initial encounter: Secondary | ICD-10-CM | POA: Insufficient documentation

## 2011-05-14 ENCOUNTER — Inpatient Hospital Stay (INDEPENDENT_AMBULATORY_CARE_PROVIDER_SITE_OTHER)
Admission: RE | Admit: 2011-05-14 | Discharge: 2011-05-14 | Disposition: A | Discharge status: Home / Self Care | Payer: No Typology Code available for payment source | Source: Ambulatory Visit | Attending: Family Medicine | Admitting: Family Medicine

## 2011-05-14 DIAGNOSIS — J069 Acute upper respiratory infection, unspecified: Secondary | ICD-10-CM

## 2011-05-14 DIAGNOSIS — R071 Chest pain on breathing: Secondary | ICD-10-CM

## 2011-05-26 ENCOUNTER — Emergency Department (HOSPITAL_BASED_OUTPATIENT_CLINIC_OR_DEPARTMENT_OTHER)
Admission: EM | Admit: 2011-05-26 | Discharge: 2011-05-27 | Disposition: A | Discharge status: Home / Self Care | Payer: Managed Care, Other (non HMO) | Attending: Emergency Medicine | Admitting: Emergency Medicine

## 2011-05-26 ENCOUNTER — Encounter: Payer: Self-pay | Admitting: *Deleted

## 2011-05-26 DIAGNOSIS — M25559 Pain in unspecified hip: Secondary | ICD-10-CM | POA: Insufficient documentation

## 2011-05-26 DIAGNOSIS — N898 Other specified noninflammatory disorders of vagina: Secondary | ICD-10-CM | POA: Insufficient documentation

## 2011-05-26 DIAGNOSIS — F17200 Nicotine dependence, unspecified, uncomplicated: Secondary | ICD-10-CM | POA: Insufficient documentation

## 2011-05-26 DIAGNOSIS — R109 Unspecified abdominal pain: Secondary | ICD-10-CM | POA: Insufficient documentation

## 2011-05-26 MED ORDER — SODIUM CHLORIDE 0.9 % IV BOLUS (SEPSIS)
1000.0000 mL | Freq: Once | INTRAVENOUS | Status: AC
  Administered 2011-05-27: 1000 mL via INTRAVENOUS
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
## 2011-05-27 ENCOUNTER — Emergency Department (INDEPENDENT_AMBULATORY_CARE_PROVIDER_SITE_OTHER): Payer: Managed Care, Other (non HMO)

## 2011-05-27 DIAGNOSIS — R197 Diarrhea, unspecified: Secondary | ICD-10-CM

## 2011-05-27 DIAGNOSIS — R109 Unspecified abdominal pain: Secondary | ICD-10-CM

## 2011-05-27 LAB — DIFFERENTIAL
Basophils Absolute: 0.1 10*3/uL (ref 0.0–0.1)
Eosinophils Absolute: 0.2 10*3/uL (ref 0.0–0.7)
Eosinophils Relative: 3 % (ref 0–5)
Monocytes Absolute: 0.6 10*3/uL (ref 0.1–1.0)
Neutrophils Relative %: 52 % (ref 43–77)
Smear Review: ADEQUATE

## 2011-05-27 LAB — URINALYSIS, ROUTINE W REFLEX MICROSCOPIC
Ketones, ur: NEGATIVE mg/dL
Leukocytes, UA: NEGATIVE
Nitrite: NEGATIVE
Specific Gravity, Urine: 1.018 (ref 1.005–1.030)
Urobilinogen, UA: 0.2 mg/dL (ref 0.0–1.0)
pH: 6.5 (ref 5.0–8.0)

## 2011-05-27 LAB — CBC
MCH: 32.5 pg (ref 26.0–34.0)
Platelets: ADEQUATE 10*3/uL (ref 150–400)
RBC: 3.91 MIL/uL (ref 3.87–5.11)
RDW: 11.5 % (ref 11.5–15.5)

## 2011-05-27 LAB — RAPID URINE DRUG SCREEN, HOSP PERFORMED
Amphetamines: NOT DETECTED
Barbiturates: NOT DETECTED
Benzodiazepines: POSITIVE — AB
Cocaine: NOT DETECTED
Tetrahydrocannabinol: NOT DETECTED

## 2011-05-27 LAB — COMPREHENSIVE METABOLIC PANEL
ALT: 13 U/L (ref 0–35)
AST: 14 U/L (ref 0–37)
Albumin: 3.8 g/dL (ref 3.5–5.2)
CO2: 25 mEq/L (ref 19–32)
Calcium: 9 mg/dL (ref 8.4–10.5)
Creatinine, Ser: 0.7 mg/dL (ref 0.50–1.10)
Sodium: 139 mEq/L (ref 135–145)
Total Protein: 7.5 g/dL (ref 6.0–8.3)

## 2011-05-27 MED ORDER — KETOROLAC TROMETHAMINE 30 MG/ML IJ SOLN
30.0000 mg | Freq: Once | INTRAMUSCULAR | Status: AC
  Administered 2011-05-27: 30 mg via INTRAVENOUS
  Filled 2011-05-27: qty 1

## 2011-05-27 MED ORDER — TRAMADOL HCL 50 MG PO TABS: 50.0000 mg | ORAL_TABLET | Freq: Four times a day (QID) | ORAL | Status: AC | PRN

## 2011-05-27 MED ORDER — TRAMADOL HCL 50 MG PO TABS
50.0000 mg | ORAL_TABLET | Freq: Once | ORAL | Status: AC
  Administered 2011-05-27: 50 mg via ORAL
  Filled 2011-05-27: qty 1

## 2011-05-27 MED ORDER — IOHEXOL 300 MG/ML  SOLN
100.0000 mL | Freq: Once | INTRAMUSCULAR | Status: AC | PRN
  Administered 2011-05-27: 100 mL via INTRAVENOUS
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
## 2011-05-28 ENCOUNTER — Other Ambulatory Visit (HOSPITAL_BASED_OUTPATIENT_CLINIC_OR_DEPARTMENT_OTHER): Payer: Self-pay | Admitting: Emergency Medicine

## 2011-05-28 ENCOUNTER — Inpatient Hospital Stay (HOSPITAL_BASED_OUTPATIENT_CLINIC_OR_DEPARTMENT_OTHER): Admit: 2011-05-28 | Payer: No Typology Code available for payment source

## 2011-05-28 DIAGNOSIS — R52 Pain, unspecified: Secondary | ICD-10-CM

## 2011-05-29 LAB — GC/CHLAMYDIA PROBE AMP, GENITAL: GC Probe Amp, Genital: NEGATIVE

## 2011-06-16 ENCOUNTER — Emergency Department (HOSPITAL_BASED_OUTPATIENT_CLINIC_OR_DEPARTMENT_OTHER)
Admission: EM | Admit: 2011-06-16 | Discharge: 2011-06-17 | Disposition: A | Discharge status: Home / Self Care | Payer: No Typology Code available for payment source | Attending: Emergency Medicine | Admitting: Emergency Medicine

## 2011-06-16 ENCOUNTER — Encounter (HOSPITAL_BASED_OUTPATIENT_CLINIC_OR_DEPARTMENT_OTHER): Payer: Self-pay

## 2011-06-16 DIAGNOSIS — R109 Unspecified abdominal pain: Secondary | ICD-10-CM | POA: Insufficient documentation

## 2011-06-16 DIAGNOSIS — R300 Dysuria: Secondary | ICD-10-CM | POA: Insufficient documentation

## 2011-06-16 LAB — URINALYSIS, ROUTINE W REFLEX MICROSCOPIC
Glucose, UA: NEGATIVE mg/dL
Protein, ur: NEGATIVE mg/dL
Specific Gravity, Urine: 1.017 (ref 1.005–1.030)

## 2011-06-16 LAB — PREGNANCY, URINE: Preg Test, Ur: NEGATIVE

## 2011-06-16 LAB — URINE MICROSCOPIC-ADD ON

## 2011-06-16 NOTE — ED Notes (Signed)
Pt. Reports she is here due to poss. Kidney stone.  Pt. Reports she has had nausea and vomiting and diarrhea tonight.  Pt. Speaking full sentences and skin is warm and dry.

## 2011-06-16 NOTE — ED Notes (Signed)
Pt states that she has severe abdominal pain.  Pt states that she has sought follow up since being seen here previously.  Pt saw obgyn and was told that pelvic exam was wnl.  Pt states that today she has severe diarrhea, nausea, vomiting, and right flank pain associated with dysuria.  Pt states that she feels like she is having another kidney stone.

## 2011-06-17 ENCOUNTER — Emergency Department (INDEPENDENT_AMBULATORY_CARE_PROVIDER_SITE_OTHER): Payer: No Typology Code available for payment source

## 2011-06-17 DIAGNOSIS — R300 Dysuria: Secondary | ICD-10-CM

## 2011-06-17 DIAGNOSIS — R112 Nausea with vomiting, unspecified: Secondary | ICD-10-CM

## 2011-06-17 DIAGNOSIS — R109 Unspecified abdominal pain: Secondary | ICD-10-CM

## 2011-06-17 MED ORDER — HYDROCODONE-ACETAMINOPHEN 5-500 MG PO TABS: 1.0000 | ORAL_TABLET | Freq: Four times a day (QID) | ORAL | Status: AC | PRN

## 2011-06-17 MED ORDER — KETOROLAC TROMETHAMINE 30 MG/ML IJ SOLN
30.0000 mg | Freq: Once | INTRAMUSCULAR | Status: AC
  Administered 2011-06-17: 30 mg via INTRAVENOUS
  Filled 2011-06-17: qty 1

## 2011-06-17 NOTE — ED Provider Notes (Signed)
History     CSN: [id number] Arrival date & time: 06/16/2011 11:37 PM  Chief Complaint  Patient presents with  . Abdominal Pain  . Dysuria    (Consider location/radiation/quality/duration/timing/severity/associated sxs/prior treatment) HPI Comments: Has history of kidney stones and has had surgery to remove.  Presents complaining of severe pain in the right lower abdomen and flank.  Denies dysuria, but feels chilled.  Patient is a 20 y.o. female presenting with abdominal pain and dysuria. The history is provided by the patient.  Abdominal Pain The primary symptoms of the illness include abdominal pain and dysuria. The primary symptoms of the illness do not include fever. The current episode started 2 days ago. The onset of the illness was sudden. The problem has been gradually worsening.  The dysuria is not associated with hematuria, frequency or urgency.  The patient states that she believes she is currently not pregnant. The patient has not had a change in bowel habit. Additional symptoms associated with the illness include chills. Symptoms associated with the illness do not include urgency, hematuria, frequency or back pain.  Dysuria  Associated symptoms include chills. Pertinent negatives include no frequency, no hematuria and no urgency.    Past Medical History  Diagnosis Date  . Renal disorder     Past Surgical History  Procedure Date  . Kidney stone surgery   . Tonsillectomy   . Inner ear surgery     History reviewed. No pertinent family history.  History  Substance Use Topics  . Smoking status: Current Everyday Smoker  . Smokeless tobacco: Not on file  . Alcohol Use: No    OB History    Grav Para Term Preterm Abortions TAB SAB Ect Mult Living                  Review of Systems  Constitutional: Positive for chills. Negative for fever and appetite change.  Gastrointestinal: Positive for abdominal pain.  Genitourinary: Positive for dysuria. Negative for  urgency, frequency and hematuria.  Musculoskeletal: Negative for back pain.  All other systems reviewed and are negative.    Allergies  Azithromycin  Home Medications   Current Outpatient Rx  Name Route Sig Dispense Refill  . CLONAZEPAM 1 MG PO TABS Oral Take 1 mg by mouth 2 (two) times daily as needed.      Marland Kitchen HYDROXYZINE HCL 10 MG PO TABS Oral Take 10 mg by mouth at bedtime.        BP 101/71  Pulse 96  Temp(Src) 98.1 F (36.7 C) (Oral)  Resp 16  Ht 5\' 4"  (1.626 m)  Wt 100 lb (45.36 kg)  BMI 17.17 kg/m2  SpO2 100%  LMP 05/21/2011  Physical Exam  Nursing note and vitals reviewed. Constitutional: She is oriented to person, place, and time. She appears well-developed and well-nourished. No distress.  HENT:  Head: Normocephalic and atraumatic.  Neck: Normal range of motion. Neck supple.  Cardiovascular: Normal rate, regular rhythm and normal heart sounds.  Exam reveals no gallop and no friction rub.   No murmur heard. Pulmonary/Chest: Effort normal and breath sounds normal. No respiratory distress.  Abdominal: Soft. There is tenderness. There is no rebound and no guarding.       ttp in the right lower abdomen and right flank.  Musculoskeletal: Normal range of motion.  Neurological: She is alert and oriented to person, place, and time.  Skin: Skin is warm and dry. She is not diaphoretic.    ED Course  Procedures (including critical  care time)  Labs Reviewed  URINALYSIS, ROUTINE W REFLEX MICROSCOPIC - Abnormal; Notable for the following:    Hgb urine dipstick LARGE (*)    Leukocytes, UA SMALL (*)    All other components within normal limits  URINE MICROSCOPIC-ADD ON - Abnormal; Notable for the following:    Squamous Epithelial / LPF FEW (*)    Bacteria, UA FEW (*)    All other components within normal limits  PREGNANCY, URINE   No results found.   No diagnosis found.    MDM  Patient reports severe pain but I am unable to find a cause for this.  There is no  kidney stone, no evidence for appendicitis, and no sign of an ovarian cyst.  Will discharge with pain meds and give time.  To return to the ER if she worsens.        Geoffery Lyons, MD 06/17/11 [phone]

## 2011-06-17 NOTE — ED Notes (Signed)
Pt. Is reporting to RN the Toradol will not work and she needs Dilaudid.  Pt. Is in no distress,

## 2011-06-17 NOTE — ED Notes (Signed)
Pt return from radiology, NAD noted, IV site unremarkable. Pt informed radiology tech that she needed stronger meds cause all she has been getting around here was ibuprofen.
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
## 2011-06-17 NOTE — ED Notes (Signed)
Pt to radiology via stretcher.  

## 2011-06-17 NOTE — ED Notes (Signed)
Pt c/o continued pain, informed pt that MD is aware of continued pain but there are no new orders at this time.

## 2011-06-17 NOTE — ED Notes (Signed)
Pt made aware of delay in radiology scan. IV site unremarkable, pt made aware that no new orders have been rec'd for additional pain meds.

## 2011-06-19 ENCOUNTER — Telehealth (HOSPITAL_BASED_OUTPATIENT_CLINIC_OR_DEPARTMENT_OTHER): Payer: Self-pay | Admitting: Emergency Medicine

## 2011-06-19 NOTE — ED Notes (Signed)
Patient called requesting note to return to work.  Message left for patient to return call.

## 2011-07-29 ENCOUNTER — Encounter (HOSPITAL_BASED_OUTPATIENT_CLINIC_OR_DEPARTMENT_OTHER): Payer: Self-pay | Admitting: Emergency Medicine

## 2011-07-29 ENCOUNTER — Emergency Department (HOSPITAL_BASED_OUTPATIENT_CLINIC_OR_DEPARTMENT_OTHER)
Admission: EM | Admit: 2011-07-29 | Discharge: 2011-07-29 | Disposition: A | Discharge status: Home / Self Care | Payer: Managed Care, Other (non HMO) | Attending: Emergency Medicine | Admitting: Emergency Medicine

## 2011-07-29 DIAGNOSIS — F319 Bipolar disorder, unspecified: Secondary | ICD-10-CM | POA: Insufficient documentation

## 2011-07-29 DIAGNOSIS — K589 Irritable bowel syndrome without diarrhea: Secondary | ICD-10-CM | POA: Insufficient documentation

## 2011-07-29 DIAGNOSIS — F17200 Nicotine dependence, unspecified, uncomplicated: Secondary | ICD-10-CM | POA: Insufficient documentation

## 2011-07-29 DIAGNOSIS — B9689 Other specified bacterial agents as the cause of diseases classified elsewhere: Secondary | ICD-10-CM | POA: Insufficient documentation

## 2011-07-29 DIAGNOSIS — R1032 Left lower quadrant pain: Secondary | ICD-10-CM | POA: Insufficient documentation

## 2011-07-29 DIAGNOSIS — N760 Acute vaginitis: Secondary | ICD-10-CM | POA: Insufficient documentation

## 2011-07-29 DIAGNOSIS — N739 Female pelvic inflammatory disease, unspecified: Secondary | ICD-10-CM | POA: Insufficient documentation

## 2011-07-29 DIAGNOSIS — A499 Bacterial infection, unspecified: Secondary | ICD-10-CM | POA: Insufficient documentation

## 2011-07-29 HISTORY — DX: Calculus of kidney: N20.0

## 2011-07-29 HISTORY — DX: Bipolar disorder, unspecified: F31.9

## 2011-07-29 HISTORY — DX: Irritable bowel syndrome, unspecified: K58.9

## 2011-07-29 LAB — URINALYSIS, ROUTINE W REFLEX MICROSCOPIC
Bilirubin Urine: NEGATIVE
Glucose, UA: NEGATIVE mg/dL
Protein, ur: NEGATIVE mg/dL
Specific Gravity, Urine: 1.016 (ref 1.005–1.030)
Urobilinogen, UA: 0.2 mg/dL (ref 0.0–1.0)

## 2011-07-29 LAB — URINE MICROSCOPIC-ADD ON: RBC / HPF: NONE SEEN RBC/hpf (ref <3)

## 2011-07-29 LAB — CBC
MCH: 31.4 pg (ref 26.0–34.0)
MCHC: 34.1 g/dL (ref 30.0–36.0)
MCV: 92.1 fL (ref 78.0–100.0)
Platelets: 293 10*3/uL (ref 150–400)
RBC: 3.66 MIL/uL — ABNORMAL LOW (ref 3.87–5.11)

## 2011-07-29 LAB — PREGNANCY, URINE: Preg Test, Ur: NEGATIVE

## 2011-07-29 LAB — COMPREHENSIVE METABOLIC PANEL
AST: 13 U/L (ref 0–37)
CO2: 25 mEq/L (ref 19–32)
Calcium: 9.4 mg/dL (ref 8.4–10.5)
Creatinine, Ser: 0.7 mg/dL (ref 0.50–1.10)
GFR calc Af Amer: >90 mL/min (ref >90)
GFR calc non Af Amer: >90 mL/min (ref >90)
Glucose, Bld: 84 mg/dL (ref 70–99)
Sodium: 138 mEq/L (ref 135–145)
Total Protein: 6.9 g/dL (ref 6.0–8.3)

## 2011-07-29 LAB — WET PREP, GENITAL: Yeast Wet Prep HPF POC: NONE SEEN

## 2011-07-29 MED ORDER — CEFTRIAXONE SODIUM 250 MG IJ SOLR
250.0000 mg | Freq: Once | INTRAMUSCULAR | Status: AC
  Administered 2011-07-29: 250 mg via INTRAMUSCULAR
  Filled 2011-07-29: qty 250

## 2011-07-29 MED ORDER — MORPHINE SULFATE 4 MG/ML IJ SOLN
4.0000 mg | Freq: Once | INTRAMUSCULAR | Status: AC
  Administered 2011-07-29: 4 mg via INTRAVENOUS
  Filled 2011-07-29: qty 1

## 2011-07-29 MED ORDER — SODIUM CHLORIDE 0.9 % IV BOLUS (SEPSIS)
1000.0000 mL | Freq: Once | INTRAVENOUS | Status: AC
  Administered 2011-07-29: 1000 mL via INTRAVENOUS

## 2011-07-29 MED ORDER — OXYCODONE-ACETAMINOPHEN 5-325 MG PO TABS: 1.0000 | ORAL_TABLET | Freq: Four times a day (QID) | ORAL | Status: AC | PRN

## 2011-07-29 MED ORDER — METRONIDAZOLE 500 MG PO TABS: 500.0000 mg | ORAL_TABLET | Freq: Two times a day (BID) | ORAL | Status: AC

## 2011-07-29 MED ORDER — OXYCODONE-ACETAMINOPHEN 5-325 MG PO TABS
1.0000 | ORAL_TABLET | Freq: Once | ORAL | Status: AC
  Administered 2011-07-29: 1 via ORAL
  Filled 2011-07-29: qty 1

## 2011-07-29 MED ORDER — ONDANSETRON HCL 4 MG/2ML IJ SOLN
4.0000 mg | Freq: Once | INTRAMUSCULAR | Status: AC
  Administered 2011-07-29: 4 mg via INTRAVENOUS
  Filled 2011-07-29: qty 2

## 2011-07-29 MED ORDER — DOXYCYCLINE HYCLATE 100 MG PO CAPS: 100.0000 mg | ORAL_CAPSULE | Freq: Two times a day (BID) | ORAL | Status: AC

## 2011-07-29 MED ORDER — HYDROMORPHONE HCL PF 1 MG/ML IJ SOLN
1.0000 mg | Freq: Once | INTRAMUSCULAR | Status: AC
  Administered 2011-07-29: 1 mg via INTRAVENOUS
  Filled 2011-07-29: qty 1

## 2011-07-29 MED ORDER — PROMETHAZINE HCL 25 MG PO TABS: 25.0000 mg | ORAL_TABLET | Freq: Four times a day (QID) | ORAL | Status: DC | PRN

## 2011-07-29 MED ORDER — HYDROMORPHONE HCL PF 1 MG/ML IJ SOLN
1.0000 mg | Freq: Once | INTRAMUSCULAR | Status: DC
Start: 2011-07-29 — End: 2011-07-29

## 2011-07-29 MED ORDER — SODIUM CHLORIDE 0.9 % IV BOLUS (SEPSIS)
1000.0000 mL | Freq: Once | INTRAVENOUS | Status: AC
  Administered 2011-07-29 (×2): 1000 mL via INTRAVENOUS

## 2011-07-29 MED ORDER — DOXYCYCLINE HYCLATE 100 MG PO TABS
100.0000 mg | ORAL_TABLET | Freq: Once | ORAL | Status: AC
  Administered 2011-07-29: 100 mg via ORAL
  Filled 2011-07-29: qty 1

## 2011-07-29 NOTE — ED Notes (Signed)
RN and MD at bedside.

## 2011-07-29 NOTE — ED Notes (Signed)
Pt present guarding L lower abd with Hx if IBS and Kidney stones

## 2011-07-29 NOTE — ED Notes (Signed)
Pt reports pain is continual, rates 10/10. MD Linker made aware and no new orders received.

## 2011-07-29 NOTE — ED Provider Notes (Signed)
History     CSN: [id number] Arrival date & time: 07/29/2011 10:18 AM   First MD Initiated Contact with Patient 07/29/11 1047      Chief Complaint  Patient presents with  . Flank Pain    severe L flank and abd pain rapid onset this AM    (Consider location/radiation/quality/duration/timing/severity/associated sxs/prior treatment) HPI Patient presents with complaint of left flank pain and left lower abdominal pain. She states she has had some diarrhea and nausea which started yesterday then pain began this morning. She's had no fever no vomiting. The pain is described as sharp. She denies any dysuria or blood in her urine. She denies any vaginal discharge or vaginal bleeding. She has a history of irritable bowel syndrome as well as kidney stones. She has had multiple prior evaluations for abdominal pain. She denies any other systemic symptoms. She has not taken anything to help with her symptoms at home.  Past Medical History  Diagnosis Date  . Renal disorder   . Kidney stones   . IBS (irritable bowel syndrome)   . Bipolar 1 disorder     Past Surgical History  Procedure Date  . Kidney stone surgery   . Tonsillectomy   . Inner ear surgery     History reviewed. No pertinent family history.  History  Substance Use Topics  . Smoking status: Current Everyday Smoker  . Smokeless tobacco: Not on file  . Alcohol Use: No    OB History    Grav Para Term Preterm Abortions TAB SAB Ect Mult Living                  Review of Systems ROS reviewed and otherwise negative except for mentioned in HPI Allergies  Azithromycin  Home Medications   Current Outpatient Rx  Name Route Sig Dispense Refill  . CLONAZEPAM 1 MG PO TABS Oral Take 1 mg by mouth 2 (two) times daily as needed.      Marland Kitchen HYDROXYZINE HCL 10 MG PO TABS Oral Take 10 mg by mouth at bedtime.        BP 91/43  Pulse 78  Temp(Src) 98 F (36.7 C) (Oral)  Resp 16  Wt 105 lb (47.628 kg)  SpO2 99%  Physical  Exam Physical Examination: General appearance - alert, well appearing, and in no distress, anxious and uncomfortable appearing Mental status - alert, oriented to person, place, and time Mouth - mucous membranes moist, pharynx normal without lesions Chest - clear to auscultation, no wheezes, rales or rhonchi, symmetric air entry Heart - normal rate, regular rhythm, normal S1, S2, no murmurs, rubs, clicks or gallops Abdomen - diffuse lower abdominal tenderness to palpation, no gaurding or rebound, NABS, ND Pelvic - normal external genitalia, vulva, vagina, + CMT, bilateral adnexal tenderness Back exam - full range of motion, no tenderness, palpable spasm or pain on motion Musculoskeletal - no joint tenderness, deformity or swelling Extremities - peripheral pulses normal, no pedal edema, no clubbing or cyanosis Skin - normal coloration and turgor, no rashes, no suspicious skin lesions noted ED Course  Procedures (including critical care time)  Labs Reviewed  URINALYSIS, ROUTINE W REFLEX MICROSCOPIC - Abnormal; Notable for the following:    Appearance CLOUDY (*)    Hgb urine dipstick TRACE (*)    Leukocytes, UA SMALL (*)    All other components within normal limits  CBC - Abnormal; Notable for the following:    RBC 3.66 (*)    Hemoglobin 11.5 (*)  HCT 33.7 (*)    All other components within normal limits  WET PREP, GENITAL - Abnormal; Notable for the following:    Clue Cells, Wet Prep MANY (*)    WBC, Wet Prep HPF POC MANY (*)    All other components within normal limits  URINE MICROSCOPIC-ADD ON - Abnormal; Notable for the following:    Squamous Epithelial / LPF FEW (*)    Bacteria, UA MANY (*)    All other components within normal limits  PREGNANCY, URINE  COMPREHENSIVE METABOLIC PANEL  LIPASE, BLOOD  GC/CHLAMYDIA PROBE AMP, GENITAL   No results found.   No diagnosis found. 10:59 AM Labs, urine obtained.  Will perform pelvic exam.  Pt has had numerous CT scans, last being  in the past 1-2 months.  Pain and nausea control provided as well as IV hydration   MDM  Pt with left lower abdominal pain and left flank pain- pt with a hx of renal stones- however no RBCs in urine so this diagnosis is less likely.  Also has hx of IBS for which she takes imodium and phenargan at home.  Labs reveal, no leukocytosis.  Urine has bacteria and WBCs.  Pelvic revealed bilateral adnexal tenderness and CMT.  Wet prep with many WBCs and clue cells.  Will treat with doxy and rocephin for PID as well as flagy for bacterial vaginosisl.  Urine culture sent- pt denies any dysuria, frequency or urgency, I suspect UTI is less likely. Discharged with strict return precautions.  Pt agreeable with plan and strongly encouraged to arrange for follow up with her PMD       Ethelda Chick, MD 07/29/11 1340

## 2011-07-29 NOTE — ED Notes (Signed)
MD made aware of BP, states to give 250 bolus of NS, recheck BP before giving dilaudid.

## 2011-07-31 LAB — GC/CHLAMYDIA PROBE AMP, GENITAL
Chlamydia, DNA Probe: NEGATIVE
GC Probe Amp, Genital: NEGATIVE

## 2011-08-14 ENCOUNTER — Emergency Department (HOSPITAL_BASED_OUTPATIENT_CLINIC_OR_DEPARTMENT_OTHER)
Admission: EM | Admit: 2011-08-14 | Discharge: 2011-08-14 | Disposition: A | Discharge status: Home / Self Care | Payer: Managed Care, Other (non HMO) | Attending: Emergency Medicine | Admitting: Emergency Medicine

## 2011-08-14 ENCOUNTER — Encounter (HOSPITAL_BASED_OUTPATIENT_CLINIC_OR_DEPARTMENT_OTHER): Payer: Self-pay | Admitting: *Deleted

## 2011-08-14 ENCOUNTER — Emergency Department (HOSPITAL_COMMUNITY)
Admission: EM | Admit: 2011-08-14 | Discharge: 2011-08-14 | Disposition: A | Discharge status: Home / Self Care | Payer: No Typology Code available for payment source

## 2011-08-14 DIAGNOSIS — F319 Bipolar disorder, unspecified: Secondary | ICD-10-CM | POA: Insufficient documentation

## 2011-08-14 DIAGNOSIS — K589 Irritable bowel syndrome without diarrhea: Secondary | ICD-10-CM | POA: Insufficient documentation

## 2011-08-14 DIAGNOSIS — J392 Other diseases of pharynx: Secondary | ICD-10-CM

## 2011-08-14 DIAGNOSIS — F17200 Nicotine dependence, unspecified, uncomplicated: Secondary | ICD-10-CM | POA: Insufficient documentation

## 2011-08-14 LAB — URINALYSIS, ROUTINE W REFLEX MICROSCOPIC
Ketones, ur: 15 mg/dL — AB
Nitrite: NEGATIVE
Protein, ur: NEGATIVE mg/dL
Urobilinogen, UA: 0.2 mg/dL (ref 0.0–1.0)

## 2011-08-14 LAB — CBC
HCT: 35.2 % — ABNORMAL LOW (ref 36.0–46.0)
Hemoglobin: 11.8 g/dL — ABNORMAL LOW (ref 12.0–15.0)
MCV: 92.6 fL (ref 78.0–100.0)
Platelets: 275 10*3/uL (ref 150–400)
RBC: 3.8 MIL/uL — ABNORMAL LOW (ref 3.87–5.11)
WBC: 7.4 10*3/uL (ref 4.0–10.5)

## 2011-08-14 LAB — BASIC METABOLIC PANEL
BUN: 16 mg/dL (ref 6–23)
CO2: 24 mEq/L (ref 19–32)
Calcium: 8.9 mg/dL (ref 8.4–10.5)
Glucose, Bld: 96 mg/dL (ref 70–99)
Sodium: 140 mEq/L (ref 135–145)

## 2011-08-14 LAB — DIFFERENTIAL
Eosinophils Relative: 2 % (ref 0–5)
Lymphocytes Relative: 35 % (ref 12–46)
Lymphs Abs: 2.6 10*3/uL (ref 0.7–4.0)
Monocytes Absolute: 0.5 10*3/uL (ref 0.1–1.0)
Monocytes Relative: 7 % (ref 3–12)

## 2011-08-14 LAB — PREGNANCY, URINE: Preg Test, Ur: NEGATIVE

## 2011-08-14 MED ORDER — TRAMADOL HCL 50 MG PO TABS: 50.0000 mg | ORAL_TABLET | Freq: Four times a day (QID) | ORAL | Status: DC | PRN

## 2011-08-14 MED ORDER — TRAMADOL HCL 50 MG PO TABS: 50.0000 mg | ORAL_TABLET | Freq: Four times a day (QID) | ORAL | Status: AC | PRN

## 2011-08-14 MED ORDER — IBUPROFEN 800 MG PO TABS: 800.0000 mg | ORAL_TABLET | Freq: Three times a day (TID) | ORAL | Status: AC

## 2011-08-14 MED ORDER — TRAMADOL HCL 50 MG PO TABS
50.0000 mg | ORAL_TABLET | Freq: Once | ORAL | Status: AC
  Administered 2011-08-14: 50 mg via ORAL
  Filled 2011-08-14: qty 1

## 2011-08-14 NOTE — ED Provider Notes (Signed)
History     CSN: [id number] Arrival date & time: 08/14/2011  6:28 PM   First MD Initiated Contact with Patient 08/14/11 1855      No chief complaint on file.   (Consider location/radiation/quality/duration/timing/severity/associated sxs/prior treatment) Patient is a 20 y.o. female presenting with pharyngitis. The history is provided by the patient.  Sore Throat This is a chronic problem. The current episode started 1 to 4 weeks ago. The problem occurs constantly. The problem has been unchanged. Associated symptoms include abdominal pain, a change in bowel habit, chest pain, chills, congestion, coughing, a fever, headaches, myalgias, nausea, a sore throat, swollen glands, urinary symptoms and weakness. The symptoms are aggravated by coughing. She has tried acetaminophen for the symptoms. The treatment provided no relief.  Pt reports she has a sorethroat and is scheduled to see the ENT tomorrow.  Pt reports she has had an elevated wbc count and that her MD has been giving her a cancer drug that starts with av.  Pt reports she has chronic abdominal pain.  Pt reports she had had a cough.  Pt reports she could not breath last pm.  Pt reports her aunt who is a Engineer, civil (consulting) gave her a shot.  (Pt does not know what kind of medication.)  Pt reports she gave it to her in the parking lot at [facility].  No problems breathing now.  Past Medical History  Diagnosis Date  . Renal disorder   . Kidney stones   . IBS (irritable bowel syndrome)   . Bipolar 1 disorder     Past Surgical History  Procedure Date  . Kidney stone surgery   . Tonsillectomy   . Inner ear surgery     No family history on file.  History  Substance Use Topics  . Smoking status: Current Everyday Smoker  . Smokeless tobacco: Not on file  . Alcohol Use: No    OB History    Grav Para Term Preterm Abortions TAB SAB Ect Mult Living                  Review of Systems  Constitutional: Positive for fever and chills.  HENT: Positive  for congestion and sore throat.   Respiratory: Positive for cough.   Cardiovascular: Positive for chest pain.  Gastrointestinal: Positive for nausea, abdominal pain and change in bowel habit.  Musculoskeletal: Positive for myalgias.  Neurological: Positive for weakness and headaches.  All other systems reviewed and are negative.    Allergies  Azithromycin  Home Medications   Current Outpatient Rx  Name Route Sig Dispense Refill  . CLONAZEPAM 1 MG PO TABS Oral Take 1 mg by mouth 2 (two) times daily as needed.      Marland Kitchen HYDROXYZINE HCL 10 MG PO TABS Oral Take 10 mg by mouth at bedtime.        BP 104/70  Pulse 86  Temp(Src) 98 F (36.7 C) (Oral)  Resp 18  SpO2 100%  Physical Exam  Nursing note and vitals reviewed. Constitutional: She is oriented to person, place, and time. She appears well-developed and well-nourished.  HENT:  Head: Normocephalic and atraumatic.  Right Ear: External ear normal.  Left Ear: External ear normal.  Nose: Nose normal.  Mouth/Throat: Oropharynx is clear and moist.  Eyes: Conjunctivae and EOM are normal. Pupils are equal, round, and reactive to light.  Neck: Normal range of motion. Neck supple.  Cardiovascular: Normal rate, regular rhythm and normal heart sounds.   Pulmonary/Chest: Effort normal.  Abdominal:  Soft. Bowel sounds are normal.  Musculoskeletal: Normal range of motion.  Neurological: She is alert and oriented to person, place, and time. She has normal reflexes.  Skin: Skin is warm.  Psychiatric: She has a normal mood and affect.    ED Course  Procedures (including critical care time)  Labs Reviewed  URINALYSIS, ROUTINE W REFLEX MICROSCOPIC - Abnormal; Notable for the following:    Color, Urine AMBER (*) BIOCHEMICALS MAY BE AFFECTED BY COLOR   Ketones, ur 15 (*)    Leukocytes, UA SMALL (*)    All other components within normal limits  URINE MICROSCOPIC-ADD ON - Abnormal; Notable for the following:    Squamous Epithelial / LPF FEW  (*)    Crystals CA OXALATE CRYSTALS (*)    All other components within normal limits  PREGNANCY, URINE   No results found.   No diagnosis found.    MDM  I checked labs on pt,  Wbcs are normal.  No sign of dehydration.    Charge nurse advised of pt's report of getting injection.  I advised pt her labs are normal.  Pt advised I think she will be fine to see ENT tomorrow.  Pt advised to follow up with her MD.   I advised pt that she needs to discuss all her concerns with her doctor.           Langston Masker, [place] 08/14/11 2332

## 2011-08-14 NOTE — ED Notes (Signed)
States last night she could not breath and she turned blue. Mucous, weakness, cough and fever. No resp distress or coughing at triage.

## 2011-08-14 NOTE — ED Notes (Signed)
Patient states that she has not been short of breath but that her throat feels swollen at times and it makes it hard for her to breath. BBS-clear and no stridor or signs of respiratory distress are noted. Air movement is well throughout lung fields and no airway constriction is heard on her throat. Patient was placed in bed per Charge Rn. Rt will cont. to monitor.

## 2011-08-14 NOTE — ED Notes (Signed)
Assessment remains as charted.  Pt appears in no distress at this time.  Pt has very flightive ideas and has multiple c/o upon check in. Pt now c/o abdominal pain which she has not mentioned before and did not report s&s to PA earlier.  EMT at bedside for blood draw.  Pt conversing with EMT and appears in no distress at this time

## 2011-08-14 NOTE — ED Provider Notes (Signed)
History/physical exam/procedure(s) were performed by non-physician practitioner and as supervising physician I was immediately available for consultation/collaboration. I have reviewed all notes and am in agreement with care and plan.   Elija Mccamish S Alleyne Lac, MD 08/14/11 2352 

## 2011-08-14 NOTE — ED Notes (Signed)
States she might have a UTI. States she might have an STD. Wants to be checked out for everything.

## 2011-08-27 ENCOUNTER — Emergency Department (HOSPITAL_COMMUNITY)
Admission: EM | Admit: 2011-08-27 | Discharge: 2011-08-27 | Disposition: A | Discharge status: Home / Self Care | Payer: Managed Care, Other (non HMO) | Attending: Emergency Medicine | Admitting: Emergency Medicine

## 2011-08-27 ENCOUNTER — Encounter (HOSPITAL_COMMUNITY): Payer: Self-pay | Admitting: *Deleted

## 2011-08-27 DIAGNOSIS — R05 Cough: Secondary | ICD-10-CM | POA: Insufficient documentation

## 2011-08-27 DIAGNOSIS — R059 Cough, unspecified: Secondary | ICD-10-CM | POA: Insufficient documentation

## 2011-08-27 DIAGNOSIS — F319 Bipolar disorder, unspecified: Secondary | ICD-10-CM | POA: Insufficient documentation

## 2011-08-27 DIAGNOSIS — R070 Pain in throat: Secondary | ICD-10-CM | POA: Insufficient documentation

## 2011-08-27 DIAGNOSIS — H53149 Visual discomfort, unspecified: Secondary | ICD-10-CM | POA: Insufficient documentation

## 2011-08-27 DIAGNOSIS — Z79899 Other long term (current) drug therapy: Secondary | ICD-10-CM | POA: Insufficient documentation

## 2011-08-27 DIAGNOSIS — R51 Headache: Secondary | ICD-10-CM | POA: Insufficient documentation

## 2011-08-27 DIAGNOSIS — R519 Headache, unspecified: Secondary | ICD-10-CM

## 2011-08-27 DIAGNOSIS — H538 Other visual disturbances: Secondary | ICD-10-CM | POA: Insufficient documentation

## 2011-08-27 HISTORY — DX: Infectious mononucleosis, unspecified without complication: B27.90

## 2011-08-27 MED ORDER — HYDROCODONE-ACETAMINOPHEN 5-325 MG PO TABS: 1.0000 | ORAL_TABLET | ORAL | Status: AC | PRN

## 2011-08-27 MED ORDER — MORPHINE SULFATE 4 MG/ML IJ SOLN
4.0000 mg | Freq: Once | INTRAMUSCULAR | Status: AC
  Administered 2011-08-27: 4 mg via INTRAVENOUS
  Filled 2011-08-27: qty 1

## 2011-08-27 MED ORDER — OXYCODONE-ACETAMINOPHEN 5-325 MG PO TABS
1.0000 | ORAL_TABLET | Freq: Once | ORAL | Status: AC
  Administered 2011-08-27: 1 via ORAL
  Filled 2011-08-27: qty 1

## 2011-08-27 NOTE — ED Notes (Signed)
Pt's friend requested another set of vital signs due to pt feeling SOB. Vital signs completely within normal limits.

## 2011-08-27 NOTE — ED Notes (Signed)
Pt has been diagnosed with mono and has been seen by ENT due to pain in throat.  Pt was told that she has polyps and today she woke up with increased pain in face.  She states that the pain in her face is "severe" and that her sore throat is increased.  Pt has also had a dry cough.  Pt has been sick for 2 month but the severity of her symptoms have been increasing.

## 2011-08-27 NOTE — ED Provider Notes (Signed)
Medical screening examination/treatment/procedure(s) were performed by non-physician practitioner and as supervising physician I was immediately available for consultation/collaboration.  Hurman Horn, MD 08/27/11 [phone]

## 2011-08-27 NOTE — ED Provider Notes (Signed)
History    this is a 20 year old female presenting to the ED with a chief complaint of sinus pain. Patient states she has been having facial pain and sore throat for the past several weeks. Pain is described as throbbing, and stabbing. Pain is worse and in the past 7 hours.  She is currently having blurred vision and light sensitivity. She also complaining of nonproductive cough. States that she has subjective fever for the past several weeks. She denies chest pain or shortness of breath, or abdominal pain. She mentioned that she has been seen both in the ED and had an ENT office for her complaint. Patient mentioned she was recently diagnosed with having mono.  She has also been seen by Dr. Suszanne Conners from ENT 2 weeks ago who has evaluated her and told her that she has ?polyps and gave her a high dose of antibiotics which she has been taken for the past 10 days. Pt also sts she has had biopsy of polyps and it's non-cancerous.  Patient states she was told to come to the ED if symptoms worsen. Patient mentioned the ENT Doctor is planning to obtain a CT scan for further evaluation.    CSN: [id number] Arrival date & time: 08/27/2011  3:22 AM   First MD Initiated Contact with Patient 08/27/11 [phone]      Chief Complaint  Patient presents with  . Sore Throat  . Headache    (Consider location/radiation/quality/duration/timing/severity/associated sxs/prior treatment) HPI  Past Medical History  Diagnosis Date  . Renal disorder   . Kidney stones   . IBS (irritable bowel syndrome)   . Bipolar 1 disorder   . Mononucleosis     Past Surgical History  Procedure Date  . Kidney stone surgery   . Tonsillectomy   . Inner ear surgery     No family history on file.  History  Substance Use Topics  . Smoking status: Former Games developer  . Smokeless tobacco: Not on file  . Alcohol Use: No    OB History    Grav Para Term Preterm Abortions TAB SAB Ect Mult Living                  Review of Systems  All  other systems reviewed and are negative.    Allergies  Azithromycin and Prednisone  Home Medications   Current Outpatient Rx  Name Route Sig Dispense Refill  . CLONAZEPAM 1 MG PO TABS Oral Take 2 mg by mouth 2 (two) times daily as needed. For anxiety    . FLINTSTONES COMPLETE 60 MG PO CHEW Oral Chew 1 tablet by mouth daily.      Marland Kitchen PROMETHAZINE HCL 25 MG PO TABS Oral Take 25 mg by mouth at bedtime.      Marland Kitchen HYDROXYZINE HCL 10 MG PO TABS Oral Take 10 mg by mouth at bedtime.      Marland Kitchen ZOLPIDEM TARTRATE 10 MG PO TABS Oral Take 10 mg by mouth at bedtime.        BP 102/69  Pulse 88  Temp(Src) 97.5 F (36.4 C) (Oral)  Resp 18  Ht 5\' 4"  (1.626 m)  Wt 106 lb (48.081 kg)  BMI 18.19 kg/m2  SpO2 99%  Physical Exam  Nursing note and vitals reviewed. Constitutional: She is oriented to person, place, and time. She appears well-developed and well-nourished.       Awake, alert, nontoxic appearance  HENT:  Head: Normocephalic and atraumatic.    Right Ear: External ear normal.  Left  Ear: External ear normal.  Nose: Nose normal.  Mouth/Throat: Oropharynx is clear and moist. No oropharyngeal exudate.  Eyes: Conjunctivae and EOM are normal. Pupils are equal, round, and reactive to light. Right eye exhibits no discharge. Left eye exhibits no discharge.  Neck: Normal range of motion. Neck supple.  Cardiovascular: Normal rate and regular rhythm.   Pulmonary/Chest: Effort normal. No respiratory distress. She has no wheezes. She has no rales. She exhibits no tenderness.  Abdominal: Soft. Bowel sounds are normal. There is no tenderness. There is no rebound.  Musculoskeletal: She exhibits no tenderness.       Baseline ROM, no obvious new focal weakness  Lymphadenopathy:    She has no cervical adenopathy.  Neurological: She is alert and oriented to person, place, and time.       Mental status and motor strength appears baseline for patient and situation  Skin: Skin is warm and dry. No rash noted.    Psychiatric: She has a normal mood and affect.    ED Course  Procedures (including critical care time)  Labs Reviewed - No data to display No results found.   No diagnosis found.    MDM  This is a 20 year old female with a chief complaints of facial pain. She has multiple ER visits full there is facial complaints in the past several months. She mentioned that she has been seen by ENT Dr., however there are no documented note from ENT at this time. Patient states she has a positive mono, however I am unable to locate a recentl mono test. She does not have any evidence of enlarged spleen. On exam, no obvious overlying findings with exception of tenderness to percussion is over the frontal sinus and maxillofacial sinus.  She is afebrile her vital signs stable. Due to her age, I don't think a sinus CT scan is appropriate this time. I recommend for patient to follow up with the ENT Dr. for further evaluation. Patient will receive a short course of pain medication in the meantime. I discussed with my attending, who agrees with plan.        Fayrene Helper, [place] 08/27/11 [phone]

## 2011-08-27 NOTE — ED Notes (Signed)
Patient requested to see the charge nurse, Ezekiel Ina nurse talking to patient

## 2011-08-30 ENCOUNTER — Other Ambulatory Visit (INDEPENDENT_AMBULATORY_CARE_PROVIDER_SITE_OTHER): Payer: Self-pay | Admitting: Otolaryngology

## 2011-09-01 ENCOUNTER — Other Ambulatory Visit: Payer: No Typology Code available for payment source

## 2011-09-04 ENCOUNTER — Ambulatory Visit
Admission: RE | Admit: 2011-09-04 | Discharge: 2011-09-04 | Disposition: A | Discharge status: Home / Self Care | Payer: No Typology Code available for payment source | Source: Ambulatory Visit | Attending: Otolaryngology | Admitting: Otolaryngology

## 2011-09-04 ENCOUNTER — Other Ambulatory Visit: Payer: No Typology Code available for payment source

## 2011-09-07 ENCOUNTER — Ambulatory Visit: Payer: Self-pay | Admitting: Family Medicine

## 2011-09-08 ENCOUNTER — Ambulatory Visit: Payer: Self-pay | Admitting: Internal Medicine

## 2011-09-25 ENCOUNTER — Ambulatory Visit (INDEPENDENT_AMBULATORY_CARE_PROVIDER_SITE_OTHER): Payer: Self-pay | Admitting: Internal Medicine

## 2011-09-25 VITALS — BP 100/68 | HR 76 | Temp 97.8°F | Ht 64.0 in | Wt 100.0 lb

## 2011-09-25 DIAGNOSIS — K589 Irritable bowel syndrome without diarrhea: Secondary | ICD-10-CM

## 2011-09-25 DIAGNOSIS — M2550 Pain in unspecified joint: Secondary | ICD-10-CM

## 2011-09-25 DIAGNOSIS — R509 Fever, unspecified: Secondary | ICD-10-CM

## 2011-09-25 LAB — CBC WITH DIFFERENTIAL/PLATELET
Basophils Absolute: 0 10*3/uL (ref 0.0–0.1)
Basophils Relative: 0.6 % (ref 0.0–3.0)
Eosinophils Absolute: 0.1 10*3/uL (ref 0.0–0.7)
Lymphocytes Relative: 42.7 % (ref 12.0–46.0)
MCHC: 33.6 g/dL (ref 30.0–36.0)
MCV: 94.5 fl (ref 78.0–100.0)
Monocytes Absolute: 0.5 10*3/uL (ref 0.1–1.0)
Neutrophils Relative %: 49.3 % (ref 43.0–77.0)
RBC: 3.6 Mil/uL — ABNORMAL LOW (ref 3.87–5.11)
RDW: 13.1 % (ref 11.5–14.6)

## 2011-09-25 NOTE — Assessment & Plan Note (Signed)
See above,  GI referral

## 2011-09-25 NOTE — Patient Instructions (Signed)
Get records from ENT and Dr Earlene Plater (all records)

## 2011-09-25 NOTE — Progress Notes (Signed)
  Subjective:    Patient ID: Tonya Davidson, female    DOB: 1990-10-27, 21 y.o.   MRN: [id number]  HPI Here with her mother, has been sick for several months, she provides me with an extensive history that I will summarize as follows: ---September 2012 saw another provider twice, diagnosed with bronchitis, CBC was checked twice, WBC elevated at 19,000 and 16,000. Was prescribed antibiotics. ----November 2012 went to the ER with fever, was diagnosed with mono, no medications were prescribed. ---November 2012 went to see ENT doctor, Dr.Teoh, had a "endoscopy", diagnosed with a severe sinus infection, prescribe antibiotics. She felt no better, went to visit ENT for a second time, a CT of the sinuses was negative. Since that point on she felt "okay" for a few days but eventually developed severe fever and arthralgias. ---Due to the fever and arthralgias went to see Dr. Connye Burkitt at a urgent care 09/06/2011: Flu test was positive, chest x-ray was negative, extensive blood work done, she tested positive for a ANA----> was prescribed Tamiflu and tramadol. ---She is here today for a followup, since 09/06/2011 she still coughing clear mucus, had on and off fever up to 2 days ago. Continue with arthralgias-pain , generalized.  Past Medical History: IBS Anxiety-- sees psych  G0  Past Surgical History: Tonsillectomy  Social History: Single Current Smoker (1/2 PPD) Alcohol use-no Drug use-no Regular exercise-no works at [employer]   Review of Systems  she admits to headaches. Before all these events her weight was 107 pounds, now wt is  100. She takes birth control pills, last menstrual period a few days ago. She admits to some nausea, vomiting. She also has chronic lower abdominal discomfort, history of IBS. On further questioning, she reports a lot of fresh red blood per rectum and painful BMs No rash or tic bites     Objective:   Physical Exam  Constitutional: She is oriented to person,  place, and time. She appears well-developed and well-nourished. No distress.  HENT:  Head: Normocephalic and atraumatic.  Neck: No thyromegaly present.       Bilateral small cervical lymphadenopathies, symmetric  Cardiovascular: Normal rate, regular rhythm and normal heart sounds.   No murmur heard. Pulmonary/Chest: Effort normal and breath sounds normal. No respiratory distress. She has no wheezes. She has no rales.  Abdominal:       Nondistended, soft, lower abdominal tenderness to palpation without mass or rebound.  Genitourinary:       External inspection is normal, and digital rectal exam normal, no stools found. Anoscopy: Several small internal hemorrhoids otherwise normal.   Neurological: She is alert and oriented to person, place, and time.  Skin: She is not diaphoretic.  Psychiatric: She has a normal mood and affect. Her behavior is normal. Judgment and thought content normal.      Assessment & Plan:  Presents today with a number of issues: Protracted respiratory symptoms Diagnosed with bronchitis, mononucleosis, sinusitis, eventually the flu History fevers Arthralgias History of IBS Rectal bleeding + ANA   Plan: Labs Get medical records from other doctors  Further advise w/results GI referral Consider ID or rheumatology referral Call mom w/ results (Heidi [phone]) Today , I spent more than 50 min with the patient given complicated history

## 2011-09-26 LAB — COMPREHENSIVE METABOLIC PANEL
ALT: 16 U/L (ref 0–35)
AST: 18 U/L (ref 0–37)
Albumin: 4 g/dL (ref 3.5–5.2)
Alkaline Phosphatase: 65 U/L (ref 39–117)
BUN: 17 mg/dL (ref 6–23)
Calcium: 8.9 mg/dL (ref 8.4–10.5)
Chloride: 104 mEq/L (ref 96–112)
Potassium: 3.7 mEq/L (ref 3.5–5.1)
Sodium: 138 mEq/L (ref 135–145)
Total Protein: 7.2 g/dL (ref 6.0–8.3)

## 2011-09-26 LAB — ANTI-NUCLEAR AB-TITER (ANA TITER)

## 2011-09-26 LAB — ANA: Anti Nuclear Antibody(ANA): POSITIVE — AB

## 2011-09-26 LAB — RHEUMATOID FACTOR: Rhuematoid fact SerPl-aCnc: <10 IU/mL (ref <=14)

## 2011-09-27 ENCOUNTER — Encounter: Payer: Self-pay | Admitting: Gastroenterology

## 2011-09-28 ENCOUNTER — Encounter: Payer: Self-pay | Admitting: *Deleted

## 2011-09-28 ENCOUNTER — Telehealth: Payer: Self-pay | Admitting: *Deleted

## 2011-09-28 DIAGNOSIS — R509 Fever, unspecified: Secondary | ICD-10-CM

## 2011-09-28 NOTE — Telephone Encounter (Signed)
Patient's mother called stating that she "wants to know what the next step is for patient"; she was expecting follow-up call from 01.07.13 OV; request call back.

## 2011-09-29 NOTE — Telephone Encounter (Signed)
In addition to the GI referral I'm considering an ID or rheumatology referral. Please arrange an ID referral, Dx fevers (and multiple other sx)

## 2011-09-29 NOTE — Telephone Encounter (Signed)
Discuss with patient mom awaiting referral info.

## 2011-10-03 ENCOUNTER — Ambulatory Visit: Payer: Self-pay | Admitting: Gastroenterology

## 2011-10-06 ENCOUNTER — Telehealth: Payer: Self-pay | Admitting: Internal Medicine

## 2011-10-06 DIAGNOSIS — M2550 Pain in unspecified joint: Secondary | ICD-10-CM

## 2011-10-06 NOTE — Telephone Encounter (Signed)
Dr. Drue Novel, in reference to the Infectious Disease referral, per phone call from Kim with [facility], after their physicians reviewed this patient's records, they feel that ID will not be the appropriate place for patient to be seen.  Please advise.

## 2011-10-09 NOTE — Telephone Encounter (Signed)
Entered an  ID referral, I am referring her to rheumatology. I left message for patient's mother in reference to this change, to call me if she has questions.

## 2011-11-10 ENCOUNTER — Emergency Department (HOSPITAL_BASED_OUTPATIENT_CLINIC_OR_DEPARTMENT_OTHER)
Admission: EM | Admit: 2011-11-10 | Discharge: 2011-11-11 | Disposition: A | Discharge status: Home / Self Care | Payer: Managed Care, Other (non HMO) | Attending: Emergency Medicine | Admitting: Emergency Medicine

## 2011-11-10 ENCOUNTER — Encounter (HOSPITAL_BASED_OUTPATIENT_CLINIC_OR_DEPARTMENT_OTHER): Payer: Self-pay

## 2011-11-10 DIAGNOSIS — F319 Bipolar disorder, unspecified: Secondary | ICD-10-CM | POA: Insufficient documentation

## 2011-11-10 DIAGNOSIS — R110 Nausea: Secondary | ICD-10-CM | POA: Insufficient documentation

## 2011-11-10 DIAGNOSIS — R55 Syncope and collapse: Secondary | ICD-10-CM | POA: Insufficient documentation

## 2011-11-10 DIAGNOSIS — K5900 Constipation, unspecified: Secondary | ICD-10-CM | POA: Insufficient documentation

## 2011-11-10 DIAGNOSIS — K589 Irritable bowel syndrome without diarrhea: Secondary | ICD-10-CM | POA: Insufficient documentation

## 2011-11-10 DIAGNOSIS — R1032 Left lower quadrant pain: Secondary | ICD-10-CM | POA: Insufficient documentation

## 2011-11-10 MED ORDER — SODIUM CHLORIDE 0.9 % IV BOLUS (SEPSIS)
1000.0000 mL | Freq: Once | INTRAVENOUS | Status: AC
  Administered 2011-11-11: 1000 mL via INTRAVENOUS

## 2011-11-10 MED ORDER — PROMETHAZINE HCL 25 MG/ML IJ SOLN
25.0000 mg | Freq: Once | INTRAMUSCULAR | Status: AC
  Administered 2011-11-11: 25 mg via INTRAVENOUS
  Filled 2011-11-10: qty 1

## 2011-11-10 MED ORDER — MORPHINE SULFATE 4 MG/ML IJ SOLN
4.0000 mg | Freq: Once | INTRAMUSCULAR | Status: AC
  Administered 2011-11-11: 4 mg via INTRAVENOUS
  Filled 2011-11-10: qty 1

## 2011-11-10 NOTE — ED Notes (Signed)
Pt sts pain became worst tonight and had syncope episode,N/D

## 2011-11-11 ENCOUNTER — Emergency Department (INDEPENDENT_AMBULATORY_CARE_PROVIDER_SITE_OTHER): Payer: Managed Care, Other (non HMO)

## 2011-11-11 DIAGNOSIS — R1032 Left lower quadrant pain: Secondary | ICD-10-CM

## 2011-11-11 DIAGNOSIS — R112 Nausea with vomiting, unspecified: Secondary | ICD-10-CM

## 2011-11-11 LAB — URINALYSIS, ROUTINE W REFLEX MICROSCOPIC
Glucose, UA: NEGATIVE mg/dL
Ketones, ur: NEGATIVE mg/dL
Leukocytes, UA: NEGATIVE
Nitrite: NEGATIVE
Specific Gravity, Urine: 1.012 (ref 1.005–1.030)
pH: 6 (ref 5.0–8.0)

## 2011-11-11 LAB — DIFFERENTIAL
Basophils Absolute: 0 10*3/uL (ref 0.0–0.1)
Basophils Relative: 0 % (ref 0–1)
Eosinophils Absolute: 0.4 10*3/uL (ref 0.0–0.7)
Monocytes Absolute: 0.5 10*3/uL (ref 0.1–1.0)
Neutro Abs: 1.8 10*3/uL (ref 1.7–7.7)
Neutrophils Relative %: 33 % — ABNORMAL LOW (ref 43–77)

## 2011-11-11 LAB — COMPREHENSIVE METABOLIC PANEL
AST: 15 U/L (ref 0–37)
Albumin: 3.2 g/dL — ABNORMAL LOW (ref 3.5–5.2)
Chloride: 107 mEq/L (ref 96–112)
Creatinine, Ser: 0.7 mg/dL (ref 0.50–1.10)
Potassium: 3.7 mEq/L (ref 3.5–5.1)
Total Bilirubin: 0.5 mg/dL (ref 0.3–1.2)
Total Protein: 6.6 g/dL (ref 6.0–8.3)

## 2011-11-11 LAB — CBC
MCH: 31.2 pg (ref 26.0–34.0)
MCHC: 34.4 g/dL (ref 30.0–36.0)
RDW: 13.5 % (ref 11.5–15.5)

## 2011-11-11 LAB — WET PREP, GENITAL
Clue Cells Wet Prep HPF POC: NONE SEEN
Trich, Wet Prep: NONE SEEN
Yeast Wet Prep HPF POC: NONE SEEN

## 2011-11-11 LAB — LIPASE, BLOOD: Lipase: 52 U/L (ref 11–59)

## 2011-11-11 LAB — PREGNANCY, URINE: Preg Test, Ur: NEGATIVE

## 2011-11-11 MED ORDER — FAMOTIDINE 20 MG PO TABS: 20.0000 mg | ORAL_TABLET | Freq: Two times a day (BID) | ORAL | Status: DC

## 2011-11-11 MED ORDER — IOHEXOL 300 MG/ML  SOLN
100.0000 mL | Freq: Once | INTRAMUSCULAR | Status: AC | PRN
  Administered 2011-11-11: 100 mL via INTRAVENOUS

## 2011-11-11 MED ORDER — DICYCLOMINE HCL 20 MG PO TABS: 20.0000 mg | ORAL_TABLET | Freq: Two times a day (BID) | ORAL | Status: DC

## 2011-11-11 MED ORDER — IBUPROFEN 600 MG PO TABS: 600.0000 mg | ORAL_TABLET | Freq: Four times a day (QID) | ORAL | Status: AC | PRN

## 2011-11-11 MED ORDER — MORPHINE SULFATE 4 MG/ML IJ SOLN
4.0000 mg | Freq: Once | INTRAMUSCULAR | Status: AC
  Administered 2011-11-11: 4 mg via INTRAVENOUS
  Filled 2011-11-11: qty 1

## 2011-11-11 MED ORDER — IOHEXOL 300 MG/ML  SOLN
40.0000 mL | Freq: Once | INTRAMUSCULAR | Status: AC | PRN
  Administered 2011-11-11: 40 mL via ORAL

## 2011-11-11 NOTE — ED Provider Notes (Signed)
History     CSN: [id number]  Arrival date & time 11/10/11  2255   First MD Initiated Contact with Patient 11/10/11 2343      Chief Complaint  Patient presents with  . Abdominal Pain    (Consider location/radiation/quality/duration/timing/severity/associated sxs/prior treatment) HPI  20yoF h/o chronic intermittent abdominal pain, fibromyalgia, bipolar disorder, irritable bowel syndrome (constipation) presents with abdominal pain. The patient states that she is experiencing abdominal pain over the past 2 days. Was worse yesterday and decided to come in. She describes it as a 10 out of 10 pain which is diffuse and crampy and worse in her left lower quadrant. She complains of nausea without vomiting. She denies diarrhea. She has baseline constipation with her irritable bowel syndrome. She denies fevers, chills. She denies vaginal discharge, vaginal bleeding. She denies hematuria, dysuria, urinary frequency, urgency. There is no back pain. She has not taken anything for pain prior to arrival. She states that the pain was so severe this evening that she stood up and had a syncopal episode. This was witnessed by her significant other who states that she blacked out for a few seconds and then came to. She did not hit her head or have any loss of consciousness during this time.  ED Notes, ED Provider Notes from 11/10/11 0000 to 11/10/11 23:33:46       [place]ichael. Cori Razor, RN 11/10/2011 23:28      Pt sts pain became worst tonight and had syncope episode,N/D      Past Medical History  Diagnosis Date  . Renal disorder   . Kidney stones   . IBS (irritable bowel syndrome)   . Bipolar 1 disorder   . Mononucleosis     Past Surgical History  Procedure Date  . Kidney stone surgery   . Tonsillectomy   . Inner ear surgery     History reviewed. No pertinent family history.  History  Substance Use Topics  . Smoking status: Current Everyday Smoker -- 10 years  . Smokeless tobacco: Not on  file  . Alcohol Use: No    OB History    Grav Para Term Preterm Abortions TAB SAB Ect Mult Living                  Review of Systems  All other systems reviewed and are negative.   except as noted HPI   Allergies  Azithromycin and Prednisone  Home Medications   Current Outpatient Rx  Name Route Sig Dispense Refill  . CLONAZEPAM 1 MG PO TABS Oral Take 2 mg by mouth 2 (two) times daily as needed. For anxiety    . DICYCLOMINE HCL 20 MG PO TABS Oral Take 1 tablet (20 mg total) by mouth 2 (two) times daily. 20 tablet 0  . FAMOTIDINE 20 MG PO TABS Oral Take 1 tablet (20 mg total) by mouth 2 (two) times daily. 30 tablet 0  . FLINTSTONES COMPLETE 60 MG PO CHEW Oral Chew 1 tablet by mouth daily.      Marland Kitchen HYDROXYZINE HCL 10 MG PO TABS Oral Take 10 mg by mouth at bedtime.      . IBUPROFEN 600 MG PO TABS Oral Take 1 tablet (600 mg total) by mouth every 6 (six) hours as needed for pain. 30 tablet 0  . PROMETHAZINE HCL 25 MG PO TABS Oral Take 25 mg by mouth at bedtime.      Marland Kitchen ZOLPIDEM TARTRATE 10 MG PO TABS Oral Take 10 mg by mouth at bedtime.  BP 100/60  Pulse 80  Temp(Src) 97.9 F (36.6 C) (Oral)  Resp 20  Ht 5\' 4"  (1.626 m)  Wt 107 lb (48.535 kg)  BMI 18.37 kg/m2  SpO2 99%  LMP 11/03/2011  Physical Exam  Nursing note and vitals reviewed. Constitutional: She is oriented to person, place, and time. She appears well-developed.  HENT:  Head: Atraumatic.  Mouth/Throat: Oropharynx is clear and moist.  Eyes: Conjunctivae and EOM are normal. Pupils are equal, round, and reactive to light.  Neck: Normal range of motion. Neck supple.  Cardiovascular: Normal rate, regular rhythm, normal heart sounds and intact distal pulses.   Pulmonary/Chest: Effort normal and breath sounds normal. No respiratory distress. She has no wheezes. She has no rales.  Abdominal: Soft. She exhibits no distension. There is tenderness. There is no rebound and no guarding.       Mild diffuse tenderness to  palpation greater in the left lower quadrant  Genitourinary:       Minimal vaginal discharge No Cervical motion tenderness, no right or left adnexal tenderness  Musculoskeletal: Normal range of motion.  Neurological: She is alert and oriented to person, place, and time.  Skin: Skin is warm and dry. No rash noted.  Psychiatric: She has a normal mood and affect.    ED Course  Procedures (including critical care time)  Labs Reviewed  CBC - Abnormal; Notable for the following:    RBC 3.65 (*)    Hemoglobin 11.4 (*)    HCT 33.1 (*)    All other components within normal limits  DIFFERENTIAL - Abnormal; Notable for the following:    Neutrophils Relative 33 (*)    Lymphocytes Relative 50 (*)    Eosinophils Relative 7 (*)    All other components within normal limits  COMPREHENSIVE METABOLIC PANEL - Abnormal; Notable for the following:    Albumin 3.2 (*)    All other components within normal limits  WET PREP, GENITAL - Abnormal; Notable for the following:    WBC, Wet Prep HPF POC FEW (*)    All other components within normal limits  LIPASE, BLOOD  URINALYSIS, ROUTINE W REFLEX MICROSCOPIC  PREGNANCY, URINE  GC/CHLAMYDIA PROBE AMP, GENITAL   Ct Abdomen Pelvis W Contrast  11/11/2011  *RADIOLOGY REPORT*  Clinical Data: Left lower quadrant pain, nausea, vomiting.  CT ABDOMEN AND PELVIS WITH CONTRAST  Technique:  Multidetector CT imaging of the abdomen and pelvis was performed following the standard protocol during bolus administration of intravenous contrast.  Contrast:  OMNIPAQUE IOHEXOL 300 MG/ML IV SOLN  Comparison: 06/17/2011.  For purposes of lifetime radiation planning in this young female, this is the fifth abdomen and pelvic CT within the last 20 months.  Findings: Lung bases are clear.  No effusions.  Heart is normal size.  Liver, gallbladder, spleen, pancreas, adrenals and kidneys are normal.  Moderate stool throughout the colon.  Small bowel is decompressed. Trace free fluid in  the pelvis, likely physiologic.  Appendix is not visualized.  No inflammatory process in the right lower quadrant.  No free air or adenopathy.  Aorta is normal caliber. Urinary bladder is unremarkable.  No acute bony abnormality.  IMPRESSION: No acute findings in the abdomen or pelvis.  Original Report Authenticated By: Cyndie Chime, M.D.    1. Abdominal pain     MDM  The patient presents with chronic intermittent abdominal pain. Her labs are reviewed here and are unremarkable. CT abdomen and pelvis is unremarkable. The patient has been  given morphine, IV fluids, Phenergan. She is not vomiting in the emergency department. I do suspect some component of drug seeking. She is requesting Dilaudid, and a prescription for Percocet for home. I had a long discussion with the patient regarding narcotic usage and refused to prescribe her narcotics for discharge. She did become quite agitated and threatened to overdose on her home Ultram vs obtaining Percocet on the black market. She has been prescribed ibuprofen, Pepcid, Bentyl. She's been advised to followup with both her primary care physician as well as her gastroenterologist. She states that she does have plans for endoscopy in the future.        Forbes Cellar, MD 11/11/11 [phone]

## 2011-11-11 NOTE — Discharge Instructions (Signed)

## 2011-11-11 NOTE — ED Notes (Signed)
Medication sent to pharmacy  

## 2011-11-16 ENCOUNTER — Encounter (HOSPITAL_BASED_OUTPATIENT_CLINIC_OR_DEPARTMENT_OTHER): Payer: Self-pay | Admitting: *Deleted

## 2011-11-16 ENCOUNTER — Emergency Department (HOSPITAL_BASED_OUTPATIENT_CLINIC_OR_DEPARTMENT_OTHER)
Admission: EM | Admit: 2011-11-16 | Discharge: 2011-11-16 | Disposition: A | Discharge status: Home / Self Care | Payer: Managed Care, Other (non HMO) | Attending: Emergency Medicine | Admitting: Emergency Medicine

## 2011-11-16 DIAGNOSIS — R109 Unspecified abdominal pain: Secondary | ICD-10-CM | POA: Insufficient documentation

## 2011-11-16 DIAGNOSIS — G8929 Other chronic pain: Secondary | ICD-10-CM

## 2011-11-16 DIAGNOSIS — K589 Irritable bowel syndrome without diarrhea: Secondary | ICD-10-CM | POA: Insufficient documentation

## 2011-11-16 DIAGNOSIS — F17200 Nicotine dependence, unspecified, uncomplicated: Secondary | ICD-10-CM | POA: Insufficient documentation

## 2011-11-16 DIAGNOSIS — F319 Bipolar disorder, unspecified: Secondary | ICD-10-CM | POA: Insufficient documentation

## 2011-11-16 DIAGNOSIS — Z79899 Other long term (current) drug therapy: Secondary | ICD-10-CM | POA: Insufficient documentation

## 2011-11-16 LAB — URINALYSIS, MICROSCOPIC ONLY
Bilirubin Urine: NEGATIVE
Glucose, UA: NEGATIVE mg/dL
Hgb urine dipstick: NEGATIVE
Ketones, ur: NEGATIVE mg/dL
Nitrite: NEGATIVE
Specific Gravity, Urine: 1.024 (ref 1.005–1.030)
pH: 7.5 (ref 5.0–8.0)

## 2011-11-16 LAB — PREGNANCY, URINE: Preg Test, Ur: NEGATIVE

## 2011-11-16 MED ORDER — DIPHENHYDRAMINE HCL 50 MG/ML IJ SOLN
50.0000 mg | Freq: Once | INTRAMUSCULAR | Status: AC
  Administered 2011-11-16: 50 mg via INTRAMUSCULAR
  Filled 2011-11-16: qty 1

## 2011-11-16 MED ORDER — METOCLOPRAMIDE HCL 5 MG/ML IJ SOLN
10.0000 mg | Freq: Once | INTRAMUSCULAR | Status: AC
  Administered 2011-11-16: 10 mg via INTRAMUSCULAR
  Filled 2011-11-16: qty 2

## 2011-11-16 MED ORDER — METOCLOPRAMIDE HCL 10 MG PO TABS: 10.0000 mg | ORAL_TABLET | Freq: Four times a day (QID) | ORAL | Status: DC

## 2011-11-16 MED ORDER — ACETAMINOPHEN 325 MG PO TABS
650.0000 mg | ORAL_TABLET | Freq: Once | ORAL | Status: AC
  Administered 2011-11-16: 650 mg via ORAL
  Filled 2011-11-16: qty 2

## 2011-11-16 NOTE — ED Notes (Signed)
Pt states that she got meds filled from last visit on 2/23 but did not take any.

## 2011-11-16 NOTE — ED Notes (Signed)
Pt did not have a peripheral IV in place when she arrived for this ED visit.

## 2011-11-16 NOTE — ED Provider Notes (Signed)
History     CSN: [id number]  Arrival date & time 11/16/11  0102   First MD Initiated Contact with Patient 11/16/11 0105      Chief Complaint  Patient presents with  . Abdominal Pain    (Consider location/radiation/quality/duration/timing/severity/associated sxs/prior treatment) Patient is a 21 y.o. female presenting with abdominal pain. The history is provided by the patient.  Abdominal Pain The primary symptoms of the illness include abdominal pain and nausea. The primary symptoms of the illness do not include fever, shortness of breath, vomiting, diarrhea or dysuria. The current episode started less than 1 hour ago. The onset of the illness was gradual. The problem has been gradually worsening.  Associated with: Nothing. The patient states that she believes she is currently not pregnant. The patient has not had a change in bowel habit. Symptoms associated with the illness do not include chills, heartburn, constipation, urgency, hematuria, frequency or back pain. Significant associated medical issues include inflammatory bowel disease.   patient with history of IBS and chronic abdominal pain. She is followed by primary care, gastroenterology and urology. She is at home tonight and developed epigastric sharp stabbing pain is nonradiating and similar in character and quality to her chronic abdominal pains. No associated fevers, chills, vomiting or diarrhea. She has some nausea. Patient also states that she passes out frequently and is worried that she may be having seizures. Tonight with onset of pain she feels like she may have passed out in the bathtub. She denies any aspiration or near drowning, and believes that the episode was brief. No reported postictal symptoms. No tongue biting or reported incontinence .  Patient called a friend who brought her in to emergency department. Patient was evaluated for similar complaints here less than a week ago and had a CAT scan at that time and blood work  done. LMP now. No flank pain or hematuria. no lower abdominal pain. No history of gallbladder problems. Patient states she's never had endoscopy or colonoscopy.  No rashes or recent travel she does not believe she had any bad food exposure. She did not take any medications prior to arrival for severe pain. She denies any heartburn reflux symptoms.  Past Medical History  Diagnosis Date  . Renal disorder   . Kidney stones   . IBS (irritable bowel syndrome)   . Bipolar 1 disorder   . Mononucleosis     Past Surgical History  Procedure Date  . Kidney stone surgery   . Tonsillectomy   . Inner ear surgery     History reviewed. No pertinent family history.  History  Substance Use Topics  . Smoking status: Current Everyday Smoker -- 10 years  . Smokeless tobacco: Not on file  . Alcohol Use: No    OB History    Grav Para Term Preterm Abortions TAB SAB Ect Mult Living                  Review of Systems  Constitutional: Negative for fever and chills.  HENT: Negative for neck pain and neck stiffness.   Eyes: Negative for pain.  Respiratory: Negative for shortness of breath.   Cardiovascular: Negative for chest pain, palpitations and leg swelling.  Gastrointestinal: Positive for nausea and abdominal pain. Negative for heartburn, vomiting, diarrhea, constipation and rectal pain.  Genitourinary: Negative for dysuria, urgency, frequency and hematuria.  Musculoskeletal: Negative for back pain.  Skin: Negative for rash.  Neurological: Negative for headaches.  All other systems reviewed and are negative.  Allergies  Azithromycin and Prednisone  Home Medications   Current Outpatient Rx  Name Route Sig Dispense Refill  . CLONAZEPAM 1 MG PO TABS Oral Take 2 mg by mouth 2 (two) times daily as needed. For anxiety    . PROMETHAZINE HCL 25 MG PO TABS Oral Take 25 mg by mouth at bedtime.      Marland Kitchen DICYCLOMINE HCL 20 MG PO TABS Oral Take 1 tablet (20 mg total) by mouth 2 (two) times daily.  20 tablet 0  . FAMOTIDINE 20 MG PO TABS Oral Take 1 tablet (20 mg total) by mouth 2 (two) times daily. 30 tablet 0  . FLINTSTONES COMPLETE 60 MG PO CHEW Oral Chew 1 tablet by mouth daily.      Marland Kitchen HYDROXYZINE HCL 10 MG PO TABS Oral Take 10 mg by mouth at bedtime.      . IBUPROFEN 600 MG PO TABS Oral Take 1 tablet (600 mg total) by mouth every 6 (six) hours as needed for pain. 30 tablet 0  . ZOLPIDEM TARTRATE 10 MG PO TABS Oral Take 10 mg by mouth at bedtime.        BP 118/72  Pulse 90  Temp(Src) 97.9 F (36.6 C) (Oral)  Resp 18  Ht 5\' 4"  (1.626 m)  Wt 107 lb (48.535 kg)  BMI 18.37 kg/m2  SpO2 100%  LMP 11/03/2011  Physical Exam  Constitutional: She is oriented to person, place, and time. She appears well-developed and well-nourished.  HENT:  Head: Normocephalic and atraumatic.  Eyes: Conjunctivae and EOM are normal. Pupils are equal, round, and reactive to light.  Neck: Trachea normal. Neck supple. No thyromegaly present.  Cardiovascular: Normal rate, regular rhythm, S1 normal, S2 normal and normal pulses.     No systolic murmur is present   No diastolic murmur is present  Pulses:      Radial pulses are 2+ on the right side, and 2+ on the left side.  Pulmonary/Chest: Effort normal and breath sounds normal. She has no wheezes. She has no rhonchi. She has no rales. She exhibits no tenderness.  Abdominal: Soft. Normal appearance and bowel sounds are normal. She exhibits no distension and no mass. There is no rebound, no guarding, no CVA tenderness and negative Murphy's sign.       Localizes discomfort epigastric region with epigastric tenderness. No left upper quadrant or right upper quadrant tenderness. No peritonitis  Musculoskeletal:       BLE:s Calves nontender, no cords or erythema, negative Homans sign  Neurological: She is alert and oriented to person, place, and time. She has normal strength. No cranial nerve deficit or sensory deficit. GCS eye subscore is 4. GCS verbal  subscore is 5. GCS motor subscore is 6.  Skin: Skin is warm and dry. No rash noted. She is not diaphoretic.  Psychiatric: Her speech is normal.       Cooperative and appropriate    ED Course  Procedures (including critical care time)  Labs Reviewed  URINALYSIS, WITH MICROSCOPIC - Abnormal; Notable for the following:    APPearance TURBID (*)    Leukocytes, UA SMALL (*)    Bacteria, UA FEW (*)    All other components within normal limits  PREGNANCY, URINE      MDM   Chronic abdominal pain.  Reglan for nausea. Tylenol for discomfort.  UA obtained and reviewed as above. Urine culture sent.  Old records reviewed a normal workup with labs, UA and CT scan abdomen pelvis 11/11/2011.  Serial abdominal evaluations remains no peritonitis. Symptoms improving.  I do not feel patient requires repeat emergent CT scan at this time. No indication for repeat blood work. No fevers or vomiting. No indication for IV fluids. Tolerates PO fluids. Given chronic nature of pain and complaints, will not provide narcotics.  Plan primary care follow up, may benefit from endoscopy and colonoscopy per GI, continue home medications, and strict return precautions were verbalized as understood.        Sunnie Nielsen, MD 11/16/11 [phone]

## 2011-11-16 NOTE — ED Notes (Signed)
MD at bedside. 

## 2011-11-16 NOTE — ED Notes (Signed)
C/o abd pain with nausea for few days

## 2011-11-16 NOTE — Discharge Instructions (Signed)
Abdominal Pain  Rest, he should drink plenty of fluids and take medications as prescribed. Call your physician as discussed in the clinic for further evaluation.  Abdominal pain can be caused by many things. Your caregiver decides the seriousness of your pain by an examination and possibly blood tests and X-rays. Many cases can be observed and treated at home. Most abdominal pain is not caused by a disease and will probably improve without treatment. However, in many cases, more time must pass before a clear cause of the pain can be found. Before that point, it may not be known if you need more testing, or if hospitalization or surgery is needed. HOME CARE INSTRUCTIONS   Do not take laxatives unless directed by your caregiver.   Take pain medicine only as directed by your caregiver.   Only take over-the-counter or prescription medicines for pain, discomfort, or fever as directed by your caregiver.   Try a clear liquid diet (broth, tea, or water) for as long as directed by your caregiver. Slowly move to a bland diet as tolerated.  SEEK IMMEDIATE MEDICAL CARE IF:   The pain does not go away.   You have a fever.   You keep throwing up (vomiting).   The pain is felt only in portions of the abdomen. Pain in the right side could possibly be appendicitis. In an adult, pain in the left lower portion of the abdomen could be colitis or diverticulitis.   You pass bloody or black tarry stools.  MAKE SURE YOU:   Understand these instructions.   Will watch your condition.   Will get help right away if you are not doing well or get worse.  Document Released: 06/14/2005 Document Revised: 05/17/2011 Document Reviewed: 04/22/2008 [facility] Patient Information 2012 [place], [place].

## 2011-12-10 ENCOUNTER — Emergency Department (HOSPITAL_COMMUNITY)
Admission: EM | Admit: 2011-12-10 | Discharge: 2011-12-11 | Disposition: A | Discharge status: Home / Self Care | Payer: Managed Care, Other (non HMO) | Attending: Emergency Medicine | Admitting: Emergency Medicine

## 2011-12-10 ENCOUNTER — Encounter (HOSPITAL_COMMUNITY): Payer: Self-pay | Admitting: *Deleted

## 2011-12-10 DIAGNOSIS — R350 Frequency of micturition: Secondary | ICD-10-CM | POA: Insufficient documentation

## 2011-12-10 DIAGNOSIS — R112 Nausea with vomiting, unspecified: Secondary | ICD-10-CM | POA: Insufficient documentation

## 2011-12-10 DIAGNOSIS — N760 Acute vaginitis: Secondary | ICD-10-CM | POA: Insufficient documentation

## 2011-12-10 DIAGNOSIS — K589 Irritable bowel syndrome without diarrhea: Secondary | ICD-10-CM | POA: Insufficient documentation

## 2011-12-10 DIAGNOSIS — A499 Bacterial infection, unspecified: Secondary | ICD-10-CM | POA: Insufficient documentation

## 2011-12-10 DIAGNOSIS — R300 Dysuria: Secondary | ICD-10-CM | POA: Insufficient documentation

## 2011-12-10 DIAGNOSIS — M549 Dorsalgia, unspecified: Secondary | ICD-10-CM | POA: Insufficient documentation

## 2011-12-10 DIAGNOSIS — B9689 Other specified bacterial agents as the cause of diseases classified elsewhere: Secondary | ICD-10-CM

## 2011-12-10 DIAGNOSIS — F319 Bipolar disorder, unspecified: Secondary | ICD-10-CM | POA: Insufficient documentation

## 2011-12-10 DIAGNOSIS — R109 Unspecified abdominal pain: Secondary | ICD-10-CM | POA: Insufficient documentation

## 2011-12-10 DIAGNOSIS — R3915 Urgency of urination: Secondary | ICD-10-CM | POA: Insufficient documentation

## 2011-12-10 NOTE — ED Notes (Signed)
Pt has been having right flank pain today (some urinary symptoms for 2 weeks). Pt has hx of kidney stone

## 2011-12-11 ENCOUNTER — Encounter (HOSPITAL_COMMUNITY): Payer: Self-pay | Admitting: Emergency Medicine

## 2011-12-11 ENCOUNTER — Emergency Department (HOSPITAL_COMMUNITY): Payer: Managed Care, Other (non HMO)

## 2011-12-11 LAB — CBC
Hemoglobin: 12.7 g/dL (ref 12.0–15.0)
MCH: 31.5 pg (ref 26.0–34.0)
MCV: 92.8 fL (ref 78.0–100.0)
RBC: 4.03 MIL/uL (ref 3.87–5.11)
WBC: 6.1 10*3/uL (ref 4.0–10.5)

## 2011-12-11 LAB — URINALYSIS, ROUTINE W REFLEX MICROSCOPIC
Bilirubin Urine: NEGATIVE
Ketones, ur: NEGATIVE mg/dL
Leukocytes, UA: NEGATIVE
Nitrite: NEGATIVE
Protein, ur: NEGATIVE mg/dL
pH: 7 (ref 5.0–8.0)

## 2011-12-11 LAB — DIFFERENTIAL
Eosinophils Absolute: 0.1 10*3/uL (ref 0.0–0.7)
Eosinophils Relative: 2 % (ref 0–5)
Lymphocytes Relative: 43 % (ref 12–46)
Lymphs Abs: 2.6 10*3/uL (ref 0.7–4.0)
Monocytes Relative: 6 % (ref 3–12)
Neutrophils Relative %: 49 % (ref 43–77)

## 2011-12-11 LAB — BASIC METABOLIC PANEL
BUN: 11 mg/dL (ref 6–23)
CO2: 23 mEq/L (ref 19–32)
GFR calc non Af Amer: >90 mL/min (ref >90)
Glucose, Bld: 85 mg/dL (ref 70–99)
Potassium: 3.6 mEq/L (ref 3.5–5.1)
Sodium: 138 mEq/L (ref 135–145)

## 2011-12-11 LAB — POCT PREGNANCY, URINE: Preg Test, Ur: NEGATIVE

## 2011-12-11 LAB — WET PREP, GENITAL: Trich, Wet Prep: NONE SEEN

## 2011-12-11 MED ORDER — ONDANSETRON HCL 4 MG/2ML IJ SOLN
4.0000 mg | Freq: Once | INTRAMUSCULAR | Status: AC
  Administered 2011-12-11: 4 mg via INTRAVENOUS
  Filled 2011-12-11: qty 2

## 2011-12-11 MED ORDER — TRAMADOL HCL 50 MG PO TABS: 50.0000 mg | ORAL_TABLET | Freq: Four times a day (QID) | ORAL | Status: AC | PRN

## 2011-12-11 MED ORDER — DIPHENHYDRAMINE HCL 50 MG/ML IJ SOLN
25.0000 mg | Freq: Once | INTRAMUSCULAR | Status: AC
  Administered 2011-12-11: 25 mg via INTRAVENOUS
  Filled 2011-12-11: qty 1

## 2011-12-11 MED ORDER — LORAZEPAM 2 MG/ML IJ SOLN
1.0000 mg | Freq: Once | INTRAMUSCULAR | Status: AC
  Administered 2011-12-11: 1 mg via INTRAVENOUS
  Filled 2011-12-11: qty 1

## 2011-12-11 MED ORDER — HYDROMORPHONE HCL PF 1 MG/ML IJ SOLN
1.0000 mg | Freq: Once | INTRAMUSCULAR | Status: AC
  Administered 2011-12-11: 1 mg via INTRAVENOUS
  Filled 2011-12-11: qty 1

## 2011-12-11 MED ORDER — KETOROLAC TROMETHAMINE 30 MG/ML IJ SOLN
30.0000 mg | Freq: Once | INTRAMUSCULAR | Status: AC
  Administered 2011-12-11: 30 mg via INTRAVENOUS
  Filled 2011-12-11: qty 1

## 2011-12-11 MED ORDER — ONDANSETRON HCL 4 MG PO TABS: 4.0000 mg | ORAL_TABLET | Freq: Four times a day (QID) | ORAL | Status: AC

## 2011-12-11 MED ORDER — METOCLOPRAMIDE HCL 5 MG/ML IJ SOLN
10.0000 mg | Freq: Once | INTRAMUSCULAR | Status: AC
  Administered 2011-12-11: 10 mg via INTRAVENOUS
  Filled 2011-12-11: qty 2

## 2011-12-11 MED ORDER — SODIUM CHLORIDE 0.9 % IV BOLUS (SEPSIS)
1000.0000 mL | Freq: Once | INTRAVENOUS | Status: AC
  Administered 2011-12-11: 1000 mL via INTRAVENOUS

## 2011-12-11 MED ORDER — METRONIDAZOLE 500 MG PO TABS
2000.0000 mg | ORAL_TABLET | Freq: Once | ORAL | Status: DC
  Filled 2011-12-11: qty 3
  Filled 2011-12-11: qty 1

## 2011-12-11 MED ORDER — PANTOPRAZOLE SODIUM 40 MG IV SOLR
40.0000 mg | Freq: Once | INTRAVENOUS | Status: AC
  Administered 2011-12-11: 40 mg via INTRAVENOUS
  Filled 2011-12-11: qty 40

## 2011-12-11 NOTE — ED Provider Notes (Signed)
History     CSN: [id number]  Arrival date & time 12/10/11  2247   First MD Initiated Contact with Patient 12/11/11 0016      Chief Complaint  Patient presents with  . Flank Pain    (Consider location/radiation/quality/duration/timing/severity/associated sxs/prior treatment) HPI Comments: Patient presents with right-sided flank pain it radiates to her suprapubic area for the past 2 days. Associated with dysuria, frequency and urgency. She denies any vaginal bleeding or discharge. She does have a history of kidney stones and has required stents in the past. She also has a history of IBS and chronic abdominal pain but this is different character of pain. She denies any fever, chest pain or shortness of breath. The pain feels similar to her previous kidney stone but she does not have dysuria at that time.  The history is provided by the patient.    Past Medical History  Diagnosis Date  . Renal disorder   . Kidney stones   . IBS (irritable bowel syndrome)   . Bipolar 1 disorder   . Mononucleosis   . Kidney stones     Past Surgical History  Procedure Date  . Kidney stone surgery   . Tonsillectomy   . Inner ear surgery     History reviewed. No pertinent family history.  History  Substance Use Topics  . Smoking status: Current Everyday Smoker -- 10 years  . Smokeless tobacco: Not on file  . Alcohol Use: No    OB History    Grav Para Term Preterm Abortions TAB SAB Ect Mult Living                  Review of Systems  Constitutional: Positive for activity change and appetite change. Negative for fever.  HENT: Negative for congestion and rhinorrhea.   Respiratory: Negative for cough, chest tightness and shortness of breath.   Cardiovascular: Negative for chest pain.  Gastrointestinal: Positive for nausea, vomiting and abdominal pain.  Genitourinary: Positive for dysuria and flank pain. Negative for hematuria.  Musculoskeletal: Positive for back pain.  Neurological:  Negative for light-headedness and headaches.    Allergies  Azithromycin and Prednisone  Home Medications   Current Outpatient Rx  Name Route Sig Dispense Refill  . CLONAZEPAM 1 MG PO TABS Oral Take 2 mg by mouth 2 (two) times daily as needed. For anxiety    . IBUPROFEN 200 MG PO TABS Oral Take 600 mg by mouth every 6 (six) hours as needed. For pain    . MELATONIN 1 MG PO TABS Oral Take 1 tablet by mouth at bedtime.    Marland Kitchen NORGESTIMATE-ETH ESTRADIOL 0.25-35 MG-MCG PO TABS Oral Take 1 tablet by mouth daily.    Marland Kitchen PROMETHAZINE HCL 25 MG PO TABS Oral Take 25 mg by mouth at bedtime.     Marland Kitchen ONDANSETRON HCL 4 MG PO TABS Oral Take 1 tablet (4 mg total) by mouth every 6 (six) hours. 12 tablet 0  . TRAMADOL HCL 50 MG PO TABS Oral Take 1 tablet (50 mg total) by mouth every 6 (six) hours as needed for pain. 15 tablet 0    BP 97/66  Pulse 65  Temp(Src) 98 F (36.7 C) (Oral)  Resp 20  SpO2 99%  LMP 11/30/2011  Physical Exam  Constitutional: She is oriented to person, place, and time. She appears well-developed and well-nourished. She appears distressed.  HENT:  Head: Normocephalic and atraumatic.  Mouth/Throat: Oropharynx is clear and moist. No oropharyngeal exudate.  Eyes: Conjunctivae are  normal. Pupils are equal, round, and reactive to light.  Neck: Normal range of motion.  Cardiovascular: Normal rate, regular rhythm and normal heart sounds.   Pulmonary/Chest: Effort normal and breath sounds normal. No respiratory distress.  Abdominal: Soft. There is tenderness. There is no rebound and no guarding.       TTP RUQ and RLQ   Musculoskeletal: Normal range of motion. She exhibits tenderness. She exhibits no edema.       R CVAT  Neurological: She is alert and oriented to person, place, and time. No cranial nerve deficit.  Skin: Skin is warm.    ED Course  Procedures (including critical care time)  Labs Reviewed  WET PREP, GENITAL - Abnormal; Notable for the following:    Clue Cells Wet  Prep HPF POC FEW (*)    WBC, Wet Prep HPF POC FEW (*)    All other components within normal limits  URINALYSIS, ROUTINE W REFLEX MICROSCOPIC  CBC  DIFFERENTIAL  BASIC METABOLIC PANEL  POCT PREGNANCY, URINE  GC/CHLAMYDIA PROBE AMP, GENITAL   Ct Abdomen Pelvis Wo Contrast  12/11/2011  *RADIOLOGY REPORT*  Clinical Data: Right flank pain.  CT ABDOMEN AND PELVIS WITHOUT CONTRAST  Technique:  Multidetector CT imaging of the abdomen and pelvis was performed following the standard protocol without intravenous contrast.  Comparison: CT of the abdomen and pelvis performed 11/11/2011  Findings: The visualized lung bases are clear.  The liver and spleen are unremarkable in appearance.  The gallbladder is within normal limits.  The pancreas and adrenal glands are unremarkable.  The kidneys are unremarkable in appearance.  There is no evidence of hydronephrosis.  No renal or ureteral stones are seen.  No perinephric stranding is appreciated.  No free fluid is identified.  The small bowel is unremarkable in appearance.  The stomach is within normal limits.  No acute vascular abnormalities are seen.  The appendix is difficult to fully characterize, but appear grossly normal in caliber, without evidence for appendicitis.  The colon is grossly unremarkable in appearance.  The bladder is mildly distended and grossly unremarkable in appearance.  The uterus is within normal limits.  The ovaries are relatively symmetric; no suspicious adnexal masses are seen.  A tampon is noted at the vagina.  No inguinal lymphadenopathy is seen.  No acute osseous abnormalities are identified.  IMPRESSION: Unremarkable CT of the abdomen and pelvis.  Original Report Authenticated By: Tonia Ghent, M.D.     1. Abdominal pain   2. Bacterial vaginosis       MDM  Flank pain and urinary symptoms.  History of kidney stones.   Abdominal tenderness without peritoneal signs.  UA negative.  No evidence of kidney stone or other explanation  for patient's pain.   Pelvic exam benign.  Will treat for bacterial vaginosis.  Once again discussed previously recommended GI followup with patient.       Glynn Octave, MD 12/11/11 [phone]

## 2011-12-11 NOTE — ED Notes (Signed)
Pt instructed to provide urine sample.

## 2011-12-11 NOTE — ED Notes (Signed)
Pt attempted to provide urine sample

## 2011-12-11 NOTE — Discharge Instructions (Signed)
 Abdominal Pain Abdominal pain can be caused by many things. Your caregiver decides the seriousness of your pain by an examination and possibly blood tests and X-rays. Many cases can be observed and treated at home. Most abdominal pain is not caused by a disease and will probably improve without treatment. However, in many cases, more time must pass before a clear cause of the pain can be found. Before that point, it may not be known if you need more testing, or if hospitalization or surgery is needed. HOME CARE INSTRUCTIONS   Do not take laxatives unless directed by your caregiver.   Take pain medicine only as directed by your caregiver.   Only take over-the-counter or prescription medicines for pain, discomfort, or fever as directed by your caregiver.   Try a clear liquid diet (broth, tea, or water) for as long as directed by your caregiver. Slowly move to a bland diet as tolerated.  SEEK IMMEDIATE MEDICAL CARE IF:   The pain does not go away.   You have a fever.   You keep throwing up (vomiting).   The pain is felt only in portions of the abdomen. Pain in the right side could possibly be appendicitis. In an adult, pain in the left lower portion of the abdomen could be colitis or diverticulitis.   You pass bloody or black tarry stools.  MAKE SURE YOU:   Understand these instructions.   Will watch your condition.   Will get help right away if you are not doing well or get worse.  Document Released: 06/14/2005 Document Revised: 08/24/2011 Document Reviewed: 04/22/2008 [facility] Patient Information 2012 [place], [place].

## 2011-12-11 NOTE — ED Notes (Signed)
Pt refused Flagyl as ordered by Med,

## 2011-12-12 LAB — GC/CHLAMYDIA PROBE AMP, GENITAL
Chlamydia, DNA Probe: NEGATIVE
GC Probe Amp, Genital: NEGATIVE

## 2011-12-24 ENCOUNTER — Emergency Department (HOSPITAL_COMMUNITY)
Admission: EM | Admit: 2011-12-24 | Discharge: 2011-12-24 | Disposition: A | Discharge status: Home / Self Care | Payer: Managed Care, Other (non HMO) | Attending: Emergency Medicine | Admitting: Emergency Medicine

## 2011-12-24 ENCOUNTER — Emergency Department (HOSPITAL_COMMUNITY): Payer: Managed Care, Other (non HMO)

## 2011-12-24 DIAGNOSIS — F319 Bipolar disorder, unspecified: Secondary | ICD-10-CM | POA: Insufficient documentation

## 2011-12-24 DIAGNOSIS — S239XXA Sprain of unspecified parts of thorax, initial encounter: Secondary | ICD-10-CM | POA: Insufficient documentation

## 2011-12-24 DIAGNOSIS — K589 Irritable bowel syndrome without diarrhea: Secondary | ICD-10-CM | POA: Insufficient documentation

## 2011-12-24 DIAGNOSIS — IMO0002 Reserved for concepts with insufficient information to code with codable children: Secondary | ICD-10-CM | POA: Insufficient documentation

## 2011-12-24 DIAGNOSIS — Z79899 Other long term (current) drug therapy: Secondary | ICD-10-CM | POA: Insufficient documentation

## 2011-12-24 DIAGNOSIS — S90416A Abrasion, unspecified lesser toe(s), initial encounter: Secondary | ICD-10-CM

## 2011-12-24 MED ORDER — METHOCARBAMOL 500 MG PO TABS: 500.0000 mg | ORAL_TABLET | Freq: Two times a day (BID) | ORAL | Status: DC

## 2011-12-24 MED ORDER — OXYCODONE-ACETAMINOPHEN 5-325 MG PO TABS
1.0000 | ORAL_TABLET | Freq: Once | ORAL | Status: AC
  Administered 2011-12-24: 1 via ORAL
  Filled 2011-12-24: qty 1
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
## 2011-12-24 NOTE — Discharge Instructions (Signed)
Thoracic Strain You have injured the muscles or tendons that attach to the upper part of your back behind your chest. This injury is called a thoracic strain, thoracic sprain, or mid-back strain.  CAUSES  The cause of thoracic strain varies. A less severe injury involves pulling a muscle or tendon without tearing it. A more severe injury involves tearing (rupturing) a muscle or tendon. With less severe injuries, there may be little loss of strength. Sometimes, there are breaks (fractures) in the bones to which the muscles are attached. These fractures are rare, unless there was a direct hit (trauma) or you have weak bones due to osteoporosis or age. Longstanding strains may be caused by overuse or improper form during certain movements. Obesity can also increase your risk for back injuries. Sudden strains may occur due to injury or not warming up properly before exercise. Often, there is no obvious cause for a thoracic strain. SYMPTOMS  The main symptom is pain, especially with movement, such as during exercise. DIAGNOSIS  Your caregiver can usually tell what is wrong by taking an X-ray and doing a physical exam. TREATMENT   Physical therapy may be helpful for recovery. Your caregiver can give you exercises to do or refer you to a physical therapist after your pain improves.   After your pain improves, strengthening and conditioning programs appropriate for your sport or occupation may be helpful.   Always warm up before physical activities or athletics. Stretching after physical activity may also help.   Certain over-the-counter medicines may also help. Ask your caregiver if there are medicines that would help you.  If this is your first thoracic strain injury, proper care and proper healing time before starting activities should prevent long-term problems. Torn ligaments and tendons require as long to heal as broken bones. Average healing times may be only 1 week for a mild strain. For torn  muscles and tendons, healing time may be up to 6 weeks to 2 months. HOME CARE INSTRUCTIONS   Apply ice to the injured area. Ice massages may also be used as directed.   Put ice in a plastic bag.   Place a towel between your skin and the bag.   Leave the ice on for 15 to 20 minutes, 3 to 4 times a day, for the first 2 days.   Only take over-the-counter or prescription medicines for pain, discomfort, or fever as directed by your caregiver.   Keep your appointments for physical therapy if this was prescribed.   Use wraps and back braces as instructed.  SEEK IMMEDIATE MEDICAL CARE IF:   You have an increase in bruising, swelling, or pain.   Your pain has not improved with medicines.   You develop new shortness of breath, chest pain, or fever.   Problems seem to be getting worse rather than better.  MAKE SURE YOU:   Understand these instructions.   Will watch your condition.   Will get help right away if you are not doing well or get worse.  Document Released: 11/25/2003 Document Revised: 08/24/2011 Document Reviewed: 10/21/2010 [facility] Patient Information 2012 [place], [place].
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
## 2011-12-24 NOTE — ED Notes (Signed)
Pt states increase in pain.MD informed no new orders. Pt informed of plan of care.

## 2011-12-24 NOTE — ED Notes (Signed)
Shanda Bumps RN made aware this pt need to be triaged

## 2011-12-24 NOTE — ED Notes (Signed)
Pt wc to lobby with friends.

## 2012-01-02 ENCOUNTER — Emergency Department (HOSPITAL_COMMUNITY)
Admission: EM | Admit: 2012-01-02 | Discharge: 2012-01-03 | Disposition: A | Discharge status: Home / Self Care | Payer: Managed Care, Other (non HMO) | Attending: Emergency Medicine | Admitting: Emergency Medicine

## 2012-01-02 ENCOUNTER — Encounter (HOSPITAL_COMMUNITY): Payer: Self-pay | Admitting: Emergency Medicine

## 2012-01-02 DIAGNOSIS — R0602 Shortness of breath: Secondary | ICD-10-CM | POA: Insufficient documentation

## 2012-01-02 DIAGNOSIS — R079 Chest pain, unspecified: Secondary | ICD-10-CM | POA: Insufficient documentation

## 2012-01-02 DIAGNOSIS — R197 Diarrhea, unspecified: Secondary | ICD-10-CM | POA: Insufficient documentation

## 2012-01-02 DIAGNOSIS — R109 Unspecified abdominal pain: Secondary | ICD-10-CM | POA: Insufficient documentation

## 2012-01-02 DIAGNOSIS — R1110 Vomiting, unspecified: Secondary | ICD-10-CM | POA: Insufficient documentation

## 2012-01-02 LAB — POCT PREGNANCY, URINE: Preg Test, Ur: NEGATIVE

## 2012-01-02 LAB — CBC
HCT: 35.8 % — ABNORMAL LOW (ref 36.0–46.0)
Hemoglobin: 12.3 g/dL (ref 12.0–15.0)
MCH: 31.5 pg (ref 26.0–34.0)
MCHC: 34.4 g/dL (ref 30.0–36.0)
MCV: 91.6 fL (ref 78.0–100.0)
Platelets: 274 10*3/uL (ref 150–400)
RBC: 3.91 MIL/uL (ref 3.87–5.11)
RDW: 12.8 % (ref 11.5–15.5)
WBC: 6.6 10*3/uL (ref 4.0–10.5)

## 2012-01-02 LAB — DIFFERENTIAL
Basophils Relative: 1 % (ref 0–1)
Lymphs Abs: 2.6 10*3/uL (ref 0.7–4.0)
Monocytes Absolute: 0.5 10*3/uL (ref 0.1–1.0)
Monocytes Relative: 7 % (ref 3–12)
Neutro Abs: 3.4 10*3/uL (ref 1.7–7.7)

## 2012-01-02 LAB — BASIC METABOLIC PANEL
BUN: 10 mg/dL (ref 6–23)
Chloride: 108 mEq/L (ref 96–112)
Creatinine, Ser: 0.76 mg/dL (ref 0.50–1.10)
GFR calc Af Amer: >90 mL/min (ref >90)
Glucose, Bld: 75 mg/dL (ref 70–99)

## 2012-01-02 NOTE — ED Notes (Signed)
PT. REPROTS LEFT CHEST PAIN , SOB , DRY COUGH , MID ABDOMINAL PAIN WITH VOMITTING AND DIARRHEA FOR 3 DAYS .

## 2012-01-02 NOTE — ED Notes (Signed)
Patient left AMA.

## 2012-01-02 NOTE — ED Notes (Signed)
Patient left hospital AMA; signed paper AMA form.  Patient verbalized understanding risks of leaving AMA, including possible death.

## 2012-01-02 NOTE — ED Notes (Signed)
Pt notified that urine sample is needed, ambulates to the bathroom to void

## 2012-01-03 LAB — URINALYSIS, ROUTINE W REFLEX MICROSCOPIC
Bilirubin Urine: NEGATIVE
Hgb urine dipstick: NEGATIVE
Ketones, ur: NEGATIVE mg/dL
Specific Gravity, Urine: 1.02 (ref 1.005–1.030)

## 2012-01-03 LAB — URINE MICROSCOPIC-ADD ON

## 2012-01-26 ENCOUNTER — Emergency Department (HOSPITAL_COMMUNITY): Payer: Managed Care, Other (non HMO)

## 2012-01-26 ENCOUNTER — Encounter (HOSPITAL_COMMUNITY): Payer: Self-pay | Admitting: Emergency Medicine

## 2012-01-26 ENCOUNTER — Emergency Department (HOSPITAL_COMMUNITY)
Admission: EM | Admit: 2012-01-26 | Discharge: 2012-01-26 | Disposition: A | Discharge status: Home / Self Care | Payer: Managed Care, Other (non HMO) | Attending: Emergency Medicine | Admitting: Emergency Medicine

## 2012-01-26 DIAGNOSIS — R82998 Other abnormal findings in urine: Secondary | ICD-10-CM | POA: Insufficient documentation

## 2012-01-26 DIAGNOSIS — R109 Unspecified abdominal pain: Secondary | ICD-10-CM

## 2012-01-26 DIAGNOSIS — F17200 Nicotine dependence, unspecified, uncomplicated: Secondary | ICD-10-CM | POA: Insufficient documentation

## 2012-01-26 DIAGNOSIS — F3189 Other bipolar disorder: Secondary | ICD-10-CM | POA: Insufficient documentation

## 2012-01-26 DIAGNOSIS — R300 Dysuria: Secondary | ICD-10-CM | POA: Insufficient documentation

## 2012-01-26 DIAGNOSIS — Z87442 Personal history of urinary calculi: Secondary | ICD-10-CM | POA: Insufficient documentation

## 2012-01-26 LAB — DIFFERENTIAL
Basophils Absolute: 0.1 10*3/uL (ref 0.0–0.1)
Basophils Relative: 1 % (ref 0–1)
Eosinophils Absolute: 0.2 10*3/uL (ref 0.0–0.7)
Monocytes Relative: 9 % (ref 3–12)
Neutro Abs: 3.5 10*3/uL (ref 1.7–7.7)
Neutrophils Relative %: 48 % (ref 43–77)

## 2012-01-26 LAB — COMPREHENSIVE METABOLIC PANEL
AST: 21 U/L (ref 0–37)
Albumin: 4.1 g/dL (ref 3.5–5.2)
Alkaline Phosphatase: 75 U/L (ref 39–117)
BUN: 15 mg/dL (ref 6–23)
Chloride: 99 mEq/L (ref 96–112)
Creatinine, Ser: 0.77 mg/dL (ref 0.50–1.10)
Potassium: 4.3 mEq/L (ref 3.5–5.1)
Total Bilirubin: 1.1 mg/dL (ref 0.3–1.2)
Total Protein: 7.3 g/dL (ref 6.0–8.3)

## 2012-01-26 LAB — URINE MICROSCOPIC-ADD ON

## 2012-01-26 LAB — URINALYSIS, ROUTINE W REFLEX MICROSCOPIC
Glucose, UA: NEGATIVE mg/dL
Ketones, ur: NEGATIVE mg/dL
Leukocytes, UA: NEGATIVE
Nitrite: NEGATIVE
Protein, ur: NEGATIVE mg/dL
Urobilinogen, UA: 1 mg/dL (ref 0.0–1.0)

## 2012-01-26 LAB — CBC
MCH: 31.8 pg (ref 26.0–34.0)
MCHC: 34.7 g/dL (ref 30.0–36.0)
Platelets: 306 10*3/uL (ref 150–400)
RDW: 12.1 % (ref 11.5–15.5)

## 2012-01-26 LAB — POCT PREGNANCY, URINE: Preg Test, Ur: NEGATIVE

## 2012-01-26 LAB — LIPASE, BLOOD: Lipase: 62 U/L — ABNORMAL HIGH (ref 11–59)

## 2012-01-26 MED ORDER — ONDANSETRON HCL 4 MG/2ML IJ SOLN
4.0000 mg | Freq: Once | INTRAMUSCULAR | Status: AC
  Administered 2012-01-26: 4 mg via INTRAVENOUS
  Filled 2012-01-26: qty 2

## 2012-01-26 MED ORDER — SODIUM CHLORIDE 0.9 % IV BOLUS (SEPSIS)
1000.0000 mL | Freq: Once | INTRAVENOUS | Status: AC
  Administered 2012-01-26: 1000 mL via INTRAVENOUS

## 2012-01-26 MED ORDER — KETOROLAC TROMETHAMINE 30 MG/ML IJ SOLN
30.0000 mg | Freq: Once | INTRAMUSCULAR | Status: AC
  Administered 2012-01-26: 30 mg via INTRAVENOUS
  Filled 2012-01-26: qty 1

## 2012-01-26 MED ORDER — HYDROMORPHONE HCL PF 1 MG/ML IJ SOLN
0.5000 mg | Freq: Once | INTRAMUSCULAR | Status: AC
  Administered 2012-01-26: 0.5 mg via INTRAVENOUS

## 2012-01-26 MED ORDER — HYDROMORPHONE HCL PF 1 MG/ML IJ SOLN
INTRAMUSCULAR | Status: AC
  Filled 2012-01-26: qty 1

## 2012-01-26 MED ORDER — HYDROMORPHONE HCL PF 1 MG/ML IJ SOLN
1.0000 mg | Freq: Once | INTRAMUSCULAR | Status: AC
  Administered 2012-01-26: 1 mg via INTRAVENOUS
  Filled 2012-01-26: qty 1

## 2012-01-26 MED ORDER — HYDROCODONE-ACETAMINOPHEN 5-325 MG PO TABS: 1.0000 | ORAL_TABLET | Freq: Four times a day (QID) | ORAL | Status: AC | PRN

## 2012-01-26 NOTE — Discharge Instructions (Signed)
As we discussed, there are several possibilities for the cause of your pain.  Your evaluation here suggests that there is ongoing kidney stone formation, and that he may have passed 1 recently.  Please make sure to speak with your urologist today to ensure appropriate ongoing care.

## 2012-01-26 NOTE — ED Provider Notes (Signed)
History     CSN: [id number]  Arrival date & time 01/26/12  4098   First MD Initiated Contact with Patient 01/26/12 [phone]      Chief Complaint  Patient presents with  . Flank Pain    HPI The patient presents with concerns over 3 weeks of right flank pain.  She notes over the past 3 weeks she said pain persistently in her right flank with radiation to her anterior abdomen.  She notes associated dysuria, but no vaginal discharge or bleeding.  She states that she has not taken any medication for pain control, but the pain is so severe that she finally presented for evaluation.  She notes ongoing diarrhea as well.  She denies any vomiting.  She has a history of multiple kidney stones, cystitis, bipolar disorder.  The patient has been evaluated for similar complaints on multiple occasions, including once one month ago. Past Medical History  Diagnosis Date  . Renal disorder   . Kidney stones   . IBS (irritable bowel syndrome)   . Bipolar 1 disorder   . Mononucleosis   . Kidney stones   . Kidney stones     Past Surgical History  Procedure Date  . Kidney stone surgery   . Tonsillectomy   . Inner ear surgery     No family history on file.  History  Substance Use Topics  . Smoking status: Current Everyday Smoker -- 10 years  . Smokeless tobacco: Not on file  . Alcohol Use: No    OB History    Grav Para Term Preterm Abortions TAB SAB Ect Mult Living                  Review of Systems  Constitutional:       HPI  HENT:       HPI otherwise negative  Eyes: Negative.   Respiratory:       HPI, otherwise negative  Cardiovascular:       HPI, otherwise nmegative  Gastrointestinal: Negative for vomiting.  Genitourinary:       HPI, otherwise negative  Musculoskeletal:       HPI, otherwise negative  Skin: Negative.   Neurological: Negative for syncope.    Allergies  Azithromycin and Prednisone  Home Medications   Current Outpatient Rx  Name Route Sig Dispense Refill    . CLONAZEPAM 1 MG PO TABS Oral Take 1 mg by mouth 2 (two) times daily as needed. For anxiety    . MELATONIN 1 MG PO TABS Oral Take 1 tablet by mouth at bedtime.    Marland Kitchen NORGESTIMATE-ETH ESTRADIOL 0.25-35 MG-MCG PO TABS Oral Take 1 tablet by mouth daily.    Marland Kitchen PROMETHAZINE HCL 25 MG PO TABS Oral Take 25 mg by mouth every 6 (six) hours as needed. For nausea      BP 112/68  Pulse 112  Temp(Src) 98 F (36.7 C) (Oral)  Resp 18  SpO2 99%  LMP 01/18/2012  Physical Exam  Nursing note and vitals reviewed. Constitutional: She is oriented to person, place, and time. She appears well-developed and well-nourished. No distress.  HENT:  Head: Normocephalic and atraumatic.  Eyes: Conjunctivae and EOM are normal.  Cardiovascular: Normal rate and regular rhythm.   Pulmonary/Chest: Effort normal and breath sounds normal. No stridor. No respiratory distress.  Abdominal: Soft. Normal appearance. She exhibits no distension. There is tenderness in the right lower quadrant, periumbilical area and suprapubic area. There is CVA tenderness. There is no rigidity, no rebound  and no guarding.  Musculoskeletal: She exhibits no edema.  Neurological: She is alert and oriented to person, place, and time. No cranial nerve deficit.  Skin: Skin is warm and dry.       Multiple tattoos  Psychiatric: Her mood appears anxious. Her speech is rapid and/or pressured. She is agitated.    ED Course  Procedures (including critical care time)   Labs Reviewed  POCT PREGNANCY, URINE  URINALYSIS, ROUTINE W REFLEX MICROSCOPIC  CBC  DIFFERENTIAL  COMPREHENSIVE METABOLIC PANEL  LIPASE, BLOOD   No results found.   No diagnosis found.   5:41 AM I discussed the results with the patient.  Specifically we discussed the fact that her KUB did not demonstrate stones, but her urinalysis did demonstrate calcium oxalate crystals.  There is no evidence of a urinary tract infection or systemic infection.  There is also no sign of  kidney failure or dysfunction.  I discussed, at length with the patient in the present cons of additional CT.  The patient defers CT at this point, which is reasonable, given the patient's history of multiple prior CT evaluations of kidney stone pain.  Given the absence of stones on KUB, and the absence of lab values concern for renal dysfunction, there is low suspicion that a clinically more relevant stone may be demonstrated on CT, while there is concern for cumulative radiation dosing in this patient.   MDM  This young female with multiple medical problems, including prior kidney stones, prior cystitis now presents with ongoing right flank pain for several weeks.  On my exam the patient is in no distress and is afebrile.  The patient has mild tenderness to palpation, but there is no evidence of peritonitis.  The patient's endorsement of prior kidney stones is consistent with demonstration of calcium oxalate crystals on her urinalysis.  The patient's evaluation here did not demonstrate signs of infection, nor any evidence of renal dysfunction.  CT study was not performed to 2 concerns over the cumulative radiation dosing, and the clinical suggestion of ongoing nephrolithiasis.  The patient has a urologist and was discharged in stable condition with explicit instructions to follow up with him today for additional evaluation and management.        Gerhard Munch, MD 01/26/12 [phone]

## 2012-01-26 NOTE — ED Notes (Signed)
Patient transported to CT 

## 2012-01-26 NOTE — ED Notes (Signed)
PT. REPORTS RIGHT FLANK PAIN WITH HEMATURIA FOR SEVERAL DAYS , ALSO REPORTS DIAGNOSED WITH UTI LAST WEEK.

## 2012-02-07 ENCOUNTER — Emergency Department (HOSPITAL_BASED_OUTPATIENT_CLINIC_OR_DEPARTMENT_OTHER): Payer: Managed Care, Other (non HMO)

## 2012-02-07 ENCOUNTER — Encounter (HOSPITAL_BASED_OUTPATIENT_CLINIC_OR_DEPARTMENT_OTHER): Payer: Self-pay

## 2012-02-07 ENCOUNTER — Emergency Department (HOSPITAL_BASED_OUTPATIENT_CLINIC_OR_DEPARTMENT_OTHER)
Admission: EM | Admit: 2012-02-07 | Discharge: 2012-02-08 | Disposition: A | Discharge status: Home / Self Care | Payer: Managed Care, Other (non HMO) | Attending: Emergency Medicine | Admitting: Emergency Medicine

## 2012-02-07 DIAGNOSIS — R319 Hematuria, unspecified: Secondary | ICD-10-CM | POA: Insufficient documentation

## 2012-02-07 DIAGNOSIS — F17200 Nicotine dependence, unspecified, uncomplicated: Secondary | ICD-10-CM | POA: Insufficient documentation

## 2012-02-07 DIAGNOSIS — E550 Rickets, active: Secondary | ICD-10-CM | POA: Insufficient documentation

## 2012-02-07 DIAGNOSIS — Z79899 Other long term (current) drug therapy: Secondary | ICD-10-CM | POA: Insufficient documentation

## 2012-02-07 DIAGNOSIS — R109 Unspecified abdominal pain: Secondary | ICD-10-CM

## 2012-02-07 DIAGNOSIS — Z87442 Personal history of urinary calculi: Secondary | ICD-10-CM | POA: Insufficient documentation

## 2012-02-07 LAB — URINE MICROSCOPIC-ADD ON

## 2012-02-07 LAB — URINALYSIS, ROUTINE W REFLEX MICROSCOPIC
Glucose, UA: NEGATIVE mg/dL
Ketones, ur: NEGATIVE mg/dL
Protein, ur: NEGATIVE mg/dL
Urobilinogen, UA: 0.2 mg/dL (ref 0.0–1.0)

## 2012-02-07 MED ORDER — KETOROLAC TROMETHAMINE 30 MG/ML IJ SOLN
30.0000 mg | Freq: Once | INTRAMUSCULAR | Status: AC
  Administered 2012-02-08: 30 mg via INTRAVENOUS
  Filled 2012-02-07: qty 1

## 2012-02-07 MED ORDER — FENTANYL CITRATE 0.05 MG/ML IJ SOLN
50.0000 ug | Freq: Once | INTRAMUSCULAR | Status: AC
  Administered 2012-02-08: 50 ug via INTRAVENOUS
  Filled 2012-02-07: qty 2

## 2012-02-07 NOTE — ED Notes (Signed)
Right flank pain, hematuria x 3 days-being seen by urologist for kidney stones

## 2012-02-08 LAB — DIFFERENTIAL
Basophils Relative: 0 % (ref 0–1)
Lymphocytes Relative: 39 % (ref 12–46)
Lymphs Abs: 2.7 10*3/uL (ref 0.7–4.0)
Monocytes Absolute: 0.5 10*3/uL (ref 0.1–1.0)
Monocytes Relative: 7 % (ref 3–12)
Neutro Abs: 3.7 10*3/uL (ref 1.7–7.7)
Neutrophils Relative %: 53 % (ref 43–77)

## 2012-02-08 LAB — CBC
HCT: 33.3 % — ABNORMAL LOW (ref 36.0–46.0)
Hemoglobin: 11.6 g/dL — ABNORMAL LOW (ref 12.0–15.0)
RBC: 3.64 MIL/uL — ABNORMAL LOW (ref 3.87–5.11)
WBC: 7 10*3/uL (ref 4.0–10.5)

## 2012-02-08 LAB — BASIC METABOLIC PANEL
BUN: 9 mg/dL (ref 6–23)
CO2: 25 mEq/L (ref 19–32)
Chloride: 103 mEq/L (ref 96–112)
GFR calc Af Amer: >90 mL/min (ref >90)
Glucose, Bld: 82 mg/dL (ref 70–99)
Potassium: 3.7 mEq/L (ref 3.5–5.1)

## 2012-02-08 MED ORDER — HYDROCODONE-ACETAMINOPHEN 5-500 MG PO TABS: 1.0000 | ORAL_TABLET | Freq: Four times a day (QID) | ORAL | Status: AC | PRN

## 2012-02-08 MED ORDER — HYDROMORPHONE HCL PF 1 MG/ML IJ SOLN
1.0000 mg | Freq: Once | INTRAMUSCULAR | Status: AC
  Administered 2012-02-08: 1 mg via INTRAVENOUS
  Filled 2012-02-08: qty 1

## 2012-02-08 MED ORDER — FENTANYL CITRATE 0.05 MG/ML IJ SOLN: 50.0000 ug | Freq: Once | INTRAMUSCULAR | Status: DC

## 2012-02-08 NOTE — ED Provider Notes (Signed)
History     CSN: [id number]  Arrival date & time 02/07/12  2300   First MD Initiated Contact with Patient 02/07/12 2330      Chief Complaint  Patient presents with  . Flank Pain  . Hematuria    (Consider location/radiation/quality/duration/timing/severity/associated sxs/prior treatment) Patient is a 21 y.o. female presenting with flank pain and hematuria. The history is provided by the patient.  Flank Pain This is a recurrent problem. The current episode started more than 1 week ago (45 days). The problem occurs constantly. The problem has not changed since onset.Pertinent negatives include no chest pain, no abdominal pain, no headaches and no shortness of breath. The symptoms are aggravated by nothing. The symptoms are relieved by nothing. Treatments tried: narcotic. The treatment provided no relief.  Hematuria Associated symptoms include flank pain. Pertinent negatives include no abdominal pain.  Radiates to the right inguinal region.  No f/c/r.    Past Medical History  Diagnosis Date  . Renal disorder   . Kidney stones   . IBS (irritable bowel syndrome)   . Bipolar 1 disorder   . Mononucleosis   . Kidney stones   . Kidney stones     Past Surgical History  Procedure Date  . Kidney stone surgery   . Tonsillectomy   . Inner ear surgery     No family history on file.  History  Substance Use Topics  . Smoking status: Current Everyday Smoker -- 10 years  . Smokeless tobacco: Not on file  . Alcohol Use: No    OB History    Grav Para Term Preterm Abortions TAB SAB Ect Mult Living                  Review of Systems  Respiratory: Negative for shortness of breath.   Cardiovascular: Negative for chest pain.  Gastrointestinal: Negative for abdominal pain.  Genitourinary: Positive for hematuria and flank pain.  Neurological: Negative for headaches.  All other systems reviewed and are negative.    Allergies  Azithromycin and Prednisone  Home Medications    Current Outpatient Rx  Name Route Sig Dispense Refill  . CLONAZEPAM 1 MG PO TABS Oral Take 1 mg by mouth 2 (two) times daily as needed. For anxiety    . PROMETHAZINE HCL 25 MG PO TABS Oral Take 25 mg by mouth every 6 (six) hours as needed. For nausea      BP 124/74  Pulse 118  Temp(Src) 98.9 F (37.2 C) (Oral)  Resp 16  Ht 5\' 4"  (1.626 m)  Wt 103 lb 14.4 oz (47.129 kg)  BMI 17.83 kg/m2  SpO2 98%  LMP 01/18/2012  Physical Exam  Constitutional: She is oriented to person, place, and time. She appears well-developed and well-nourished.  HENT:  Head: Normocephalic and atraumatic.  Mouth/Throat: Oropharynx is clear and moist.  Eyes: Conjunctivae are normal. Pupils are equal, round, and reactive to light.  Neck: Normal range of motion. Neck supple.  Cardiovascular: Normal rate and regular rhythm.   Pulmonary/Chest: Effort normal and breath sounds normal. She has no wheezes. She has no rales.  Abdominal: Soft. Bowel sounds are normal. There is no tenderness. There is no rebound and no guarding.  Musculoskeletal: Normal range of motion. She exhibits no edema.  Neurological: She is alert and oriented to person, place, and time.  Skin: Skin is warm and dry.  Psychiatric: Her speech is rapid and/or pressured.    ED Course  Procedures (including critical care time)  Labs  Reviewed  URINALYSIS, ROUTINE W REFLEX MICROSCOPIC - Abnormal; Notable for the following:    APPearance CLOUDY (*)    Hgb urine dipstick LARGE (*)    All other components within normal limits  URINE MICROSCOPIC-ADD ON - Abnormal; Notable for the following:    Bacteria, UA FEW (*)    All other components within normal limits  CBC - Abnormal; Notable for the following:    RBC 3.64 (*)    Hemoglobin 11.6 (*)    HCT 33.3 (*)    All other components within normal limits  PREGNANCY, URINE  DIFFERENTIAL  BASIC METABOLIC PANEL   No results found.   No diagnosis found.    MDM  There were not kidney stones  intrarenal on CT on 12/11/11 due to cumulative dose of radiation will not CT. Unlikely for a new stone to have developed in that short a period of time. No CVA tenderness.  No signs of infection on bloodwork nor urine. Patient needs to follow up with her family doctor and urologist for ongoing care.  ED is not the appropriate place for chronic pain.  Patient verbalizes understanding and agrees to follow up        Eugenia Eldredge Smitty Cords, MD 02/08/12 [phone]

## 2012-03-11 ENCOUNTER — Emergency Department (HOSPITAL_BASED_OUTPATIENT_CLINIC_OR_DEPARTMENT_OTHER)
Admission: EM | Admit: 2012-03-11 | Discharge: 2012-03-11 | Disposition: A | Discharge status: Home / Self Care | Payer: No Typology Code available for payment source | Attending: Emergency Medicine | Admitting: Emergency Medicine

## 2012-03-11 ENCOUNTER — Emergency Department (HOSPITAL_BASED_OUTPATIENT_CLINIC_OR_DEPARTMENT_OTHER): Payer: No Typology Code available for payment source

## 2012-03-11 ENCOUNTER — Encounter (HOSPITAL_BASED_OUTPATIENT_CLINIC_OR_DEPARTMENT_OTHER): Payer: Self-pay | Admitting: Emergency Medicine

## 2012-03-11 DIAGNOSIS — Z79899 Other long term (current) drug therapy: Secondary | ICD-10-CM | POA: Insufficient documentation

## 2012-03-11 DIAGNOSIS — W010XXA Fall on same level from slipping, tripping and stumbling without subsequent striking against object, initial encounter: Secondary | ICD-10-CM | POA: Insufficient documentation

## 2012-03-11 DIAGNOSIS — F17200 Nicotine dependence, unspecified, uncomplicated: Secondary | ICD-10-CM | POA: Insufficient documentation

## 2012-03-11 DIAGNOSIS — Z881 Allergy status to other antibiotic agents status: Secondary | ICD-10-CM | POA: Insufficient documentation

## 2012-03-11 DIAGNOSIS — M542 Cervicalgia: Secondary | ICD-10-CM | POA: Insufficient documentation

## 2012-03-11 DIAGNOSIS — Z87442 Personal history of urinary calculi: Secondary | ICD-10-CM | POA: Insufficient documentation

## 2012-03-11 DIAGNOSIS — M25579 Pain in unspecified ankle and joints of unspecified foot: Secondary | ICD-10-CM | POA: Insufficient documentation

## 2012-03-11 DIAGNOSIS — Z888 Allergy status to other drugs, medicaments and biological substances status: Secondary | ICD-10-CM | POA: Insufficient documentation

## 2012-03-11 DIAGNOSIS — Y9229 Other specified public building as the place of occurrence of the external cause: Secondary | ICD-10-CM | POA: Insufficient documentation

## 2012-03-11 DIAGNOSIS — IMO0002 Reserved for concepts with insufficient information to code with codable children: Secondary | ICD-10-CM

## 2012-03-11 DIAGNOSIS — S161XXA Strain of muscle, fascia and tendon at neck level, initial encounter: Secondary | ICD-10-CM

## 2012-03-11 DIAGNOSIS — F319 Bipolar disorder, unspecified: Secondary | ICD-10-CM | POA: Insufficient documentation

## 2012-03-11 MED ORDER — NAPROXEN 250 MG PO TABS
500.0000 mg | ORAL_TABLET | Freq: Once | ORAL | Status: AC
  Administered 2012-03-11: 500 mg via ORAL
  Filled 2012-03-11: qty 2

## 2012-03-11 NOTE — ED Notes (Signed)
Patient reports that she slipped on spilled shampoo in the [place]le at [employer]. Redness and minimal bruising noted to the left lateral aspect of left foot, ambulatory from triage.

## 2012-03-11 NOTE — ED Provider Notes (Signed)
History     CSN: [id number]  Arrival date & time 03/11/12  0220   First MD Initiated Contact with Patient 03/11/12 0235      Chief Complaint  Patient presents with  . Foot Injury    (Consider location/radiation/quality/duration/timing/severity/associated sxs/prior treatment) HPI This is a 21 year old white female who slipped on some shampoo at [employer] just prior to arrival. She twisted her left foot and ankle and has pain over the left lateral malleolus and the base of the left fifth metatarsal. She is also complaining of neck pain primarily on the right side. The pain is mild to moderate. She ambulates without difficulty. She fell onto another person so did not injured tailbone. Pain is worse with palpation or ambulation.  Past Medical History  Diagnosis Date  . IBS (irritable bowel syndrome)   . Bipolar 1 disorder   . Mononucleosis   . Kidney stones     Past Surgical History  Procedure Date  . Kidney stone surgery   . Tonsillectomy   . Inner ear surgery     No family history on file.  History  Substance Use Topics  . Smoking status: Current Everyday Smoker -- 10 years  . Smokeless tobacco: Not on file  . Alcohol Use: No    OB History    Grav Para Term Preterm Abortions TAB SAB Ect Mult Living                  Review of Systems  All other systems reviewed and are negative.    Allergies  Azithromycin and Prednisone  Home Medications   Current Outpatient Rx  Name Route Sig Dispense Refill  . DOXYCYCLINE HYCLATE 100 MG PO CPEP Oral Take 100 mg by mouth 2 (two) times daily.    Marland Kitchen NORGESTIMATE-ETH ESTRADIOL 0.25-35 MG-MCG PO TABS Oral Take 1 tablet by mouth daily.    Marland Kitchen OMEPRAZOLE 40 MG PO CPDR Oral Take 40 mg by mouth daily.    . OXYCODONE-ACETAMINOPHEN 10-325 MG PO TABS Oral Take 1 tablet by mouth every 4 (four) hours as needed.    Marland Kitchen CLONAZEPAM 1 MG PO TABS Oral Take 1 mg by mouth 2 (two) times daily as needed. For anxiety    . PROMETHAZINE HCL 25 MG PO  TABS Oral Take 25 mg by mouth every 6 (six) hours as needed. For nausea      BP 114/75  Pulse 94  Temp 98.6 F (37 C) (Oral)  Resp 18  SpO2 100%  Physical Exam General: Well-developed, well-nourished female in no acute distress; appearance consistent with age of record HENT: normocephalic, atraumatic Eyes: pupils equal round and reactive to light; extraocular muscles intact Neck: supple; mild tenderness at C7 Heart: regular rate and rhythm Lungs: clear to auscultation bilaterally Abdomen: soft; nondistended Extremities: No deformity; full range of motion; pulses normal; mild tenderness over left lateral malleolus and the base of the left fifth metatarsal without ecchymosis, swelling or crepitus Neurologic: Awake, alert and oriented; motor function intact in all extremities and symmetric; no facial droop Skin: Warm and dry Psychiatric: Normal mood and affect    ED Course  Procedures (including critical care time)    MDM   Nursing notes and vitals signs, including pulse oximetry, reviewed.  Summary of this visit's results, reviewed by myself:   Imaging Studies: Dg Cervical Spine Complete  03/11/2012  *RADIOLOGY REPORT*  Clinical Data: Posterior neck pain after slip and fall injury.  CERVICAL SPINE - COMPLETE 4+ VIEW  Comparison: None.  Findings: Straightening of the usual cervical lordosis which is likely due to patient positioning but muscle spasm or ligamentous injury can also have this appearance.  No abnormal anterior subluxation of the cervical vertebrae.  Normal alignment of the facet joints.  The lateral masses of C1 are symmetrical.  The odontoid process is intact.  No vertebral compression deformities. Intervertebral disc space heights are preserved.  No prevertebral soft tissue swelling.  Bone cortex and trabecular architecture are intact.  No focal bone lesion or bone destruction.  IMPRESSION: Straightening of the usual cervical lordosis which is likely due to patient  positioning but muscle spasm or ligamentous injury is not excluded.  No displaced fractures identified.  Original Report Authenticated By: Marlon Pel, M.D.   Dg Ankle Complete Left  03/11/2012  *RADIOLOGY REPORT*  Clinical Data: Lateral ankle pain after slip and fall injury.  LEFT ANKLE COMPLETE - 3+ VIEW  Comparison: None.  Findings: The left ankle appears intact. No evidence of acute fracture or subluxation.  No focal bone lesions.  Bone matrix and cortex appear intact.  No abnormal radiopaque densities in the soft tissues.  IMPRESSION: No acute bony abnormalities.  Original Report Authenticated By: Marlon Pel, M.D.   Dg Foot Complete Left  03/11/2012  *RADIOLOGY REPORT*  Clinical Data: Left foot pain after slip and fall injury.  LEFT FOOT - COMPLETE 3+ VIEW  Comparison: None.  Findings: The left foot appears intact. No evidence of acute fracture or subluxation.  No focal bone lesions.  Bone matrix and cortex appear intact.  No abnormal radiopaque densities in the soft tissues.  An accessory ossicle adjacent to the navicular.  IMPRESSION: No acute bony abnormalities.  Original Report Authenticated By: Marlon Pel, M.D.            Hanley Seamen, MD 03/11/12 [phone]

## 2012-04-01 ENCOUNTER — Encounter (HOSPITAL_BASED_OUTPATIENT_CLINIC_OR_DEPARTMENT_OTHER): Payer: Self-pay | Admitting: *Deleted

## 2012-04-01 ENCOUNTER — Emergency Department (HOSPITAL_BASED_OUTPATIENT_CLINIC_OR_DEPARTMENT_OTHER)
Admission: EM | Admit: 2012-04-01 | Discharge: 2012-04-01 | Disposition: A | Discharge status: Home / Self Care | Payer: Managed Care, Other (non HMO) | Attending: Emergency Medicine | Admitting: Emergency Medicine

## 2012-04-01 ENCOUNTER — Emergency Department (HOSPITAL_BASED_OUTPATIENT_CLINIC_OR_DEPARTMENT_OTHER): Payer: Managed Care, Other (non HMO)

## 2012-04-01 DIAGNOSIS — R319 Hematuria, unspecified: Secondary | ICD-10-CM | POA: Insufficient documentation

## 2012-04-01 DIAGNOSIS — F17200 Nicotine dependence, unspecified, uncomplicated: Secondary | ICD-10-CM | POA: Insufficient documentation

## 2012-04-01 DIAGNOSIS — R102 Pelvic and perineal pain: Secondary | ICD-10-CM

## 2012-04-01 DIAGNOSIS — K589 Irritable bowel syndrome without diarrhea: Secondary | ICD-10-CM | POA: Insufficient documentation

## 2012-04-01 DIAGNOSIS — F319 Bipolar disorder, unspecified: Secondary | ICD-10-CM | POA: Insufficient documentation

## 2012-04-01 DIAGNOSIS — N949 Unspecified condition associated with female genital organs and menstrual cycle: Secondary | ICD-10-CM | POA: Insufficient documentation

## 2012-04-01 LAB — WET PREP, GENITAL: Trich, Wet Prep: NONE SEEN

## 2012-04-01 LAB — RAPID URINE DRUG SCREEN, HOSP PERFORMED
Amphetamines: NOT DETECTED
Benzodiazepines: NOT DETECTED
Cocaine: NOT DETECTED
Opiates: NOT DETECTED
Tetrahydrocannabinol: NOT DETECTED

## 2012-04-01 LAB — URINALYSIS, ROUTINE W REFLEX MICROSCOPIC
Glucose, UA: NEGATIVE mg/dL
Leukocytes, UA: NEGATIVE
Nitrite: NEGATIVE
Protein, ur: NEGATIVE mg/dL
pH: 7.5 (ref 5.0–8.0)

## 2012-04-01 LAB — CBC WITH DIFFERENTIAL/PLATELET
Basophils Relative: 0 % (ref 0–1)
Eosinophils Absolute: 0.1 10*3/uL (ref 0.0–0.7)
Hemoglobin: 11.1 g/dL — ABNORMAL LOW (ref 12.0–15.0)
MCHC: 34.5 g/dL (ref 30.0–36.0)
Monocytes Relative: 7 % (ref 3–12)
Neutro Abs: 4.6 10*3/uL (ref 1.7–7.7)
Neutrophils Relative %: 60 % (ref 43–77)
Platelets: 285 10*3/uL (ref 150–400)
RBC: 3.48 MIL/uL — ABNORMAL LOW (ref 3.87–5.11)

## 2012-04-01 LAB — PREGNANCY, URINE: Preg Test, Ur: NEGATIVE

## 2012-04-01 LAB — COMPREHENSIVE METABOLIC PANEL
ALT: 8 U/L (ref 0–35)
AST: 15 U/L (ref 0–37)
Albumin: 3.9 g/dL (ref 3.5–5.2)
Alkaline Phosphatase: 60 U/L (ref 39–117)
BUN: 8 mg/dL (ref 6–23)
Chloride: 104 mEq/L (ref 96–112)
Potassium: 3.6 mEq/L (ref 3.5–5.1)
Sodium: 139 mEq/L (ref 135–145)
Total Bilirubin: 0.6 mg/dL (ref 0.3–1.2)
Total Protein: 7.2 g/dL (ref 6.0–8.3)

## 2012-04-01 LAB — URINE MICROSCOPIC-ADD ON

## 2012-04-01 MED ORDER — KETOROLAC TROMETHAMINE 30 MG/ML IJ SOLN: 30.0000 mg | Freq: Once | INTRAMUSCULAR | Status: DC

## 2012-04-01 MED ORDER — DEXTROSE 5 % IV SOLN
1.0000 g | Freq: Once | INTRAVENOUS | Status: AC
  Administered 2012-04-01: 1 g via INTRAVENOUS
  Filled 2012-04-01: qty 10

## 2012-04-01 MED ORDER — PROMETHAZINE HCL 25 MG/ML IJ SOLN
12.5000 mg | Freq: Once | INTRAMUSCULAR | Status: AC
  Administered 2012-04-01: 12.5 mg via INTRAVENOUS
  Filled 2012-04-01: qty 1

## 2012-04-01 MED ORDER — MORPHINE SULFATE 4 MG/ML IJ SOLN
4.0000 mg | Freq: Once | INTRAMUSCULAR | Status: AC
  Administered 2012-04-01: 4 mg via INTRAVENOUS
  Filled 2012-04-01: qty 1

## 2012-04-01 MED ORDER — FENTANYL CITRATE 0.05 MG/ML IJ SOLN
50.0000 ug | Freq: Once | INTRAMUSCULAR | Status: AC
  Administered 2012-04-01: 50 ug via INTRAVENOUS
  Filled 2012-04-01: qty 2

## 2012-04-01 MED ORDER — SODIUM CHLORIDE 0.9 % IV BOLUS (SEPSIS)
2000.0000 mL | Freq: Once | INTRAVENOUS | Status: AC
  Administered 2012-04-01: 1000 mL via INTRAVENOUS

## 2012-04-01 MED ORDER — MORPHINE SULFATE 4 MG/ML IJ SOLN
4.0000 mg | INTRAMUSCULAR | Status: DC | PRN
  Administered 2012-04-01: 4 mg via INTRAVENOUS
  Filled 2012-04-01: qty 1

## 2012-04-01 MED ORDER — DOXYCYCLINE HYCLATE 100 MG PO CAPS: 100.0000 mg | ORAL_CAPSULE | Freq: Two times a day (BID) | ORAL | Status: AC

## 2012-04-01 MED ORDER — ONDANSETRON HCL 4 MG/2ML IJ SOLN
4.0000 mg | Freq: Once | INTRAMUSCULAR | Status: AC
  Administered 2012-04-01: 4 mg via INTRAVENOUS
  Filled 2012-04-01: qty 2

## 2012-04-01 NOTE — ED Notes (Signed)
MD at bedside. 

## 2012-04-01 NOTE — ED Notes (Addendum)
Dr. Read Drivers at bedside to update pt. regarding plan for an u/s at 0800. Pt. States her pain level is 8/10 to the right lower quad of her abdomen. Pt. request more pain meds and Dr. Read Drivers states pt. can have her next dose at 0700. Will monitor.

## 2012-04-01 NOTE — ED Notes (Signed)
Patient transported to Ultrasound 

## 2012-04-01 NOTE — ED Notes (Signed)
Patient requested that her IV be removed and that the RN bring her discharge paperwork.  Patient was notified she had an order for pain medications via IV; patient stated that "that medication doesn't work, just take it out".

## 2012-04-01 NOTE — ED Provider Notes (Signed)
History     CSN: [id number]  Arrival date & time 04/01/12  4098   First MD Initiated Contact with Patient 04/01/12 0440      Chief Complaint  Patient presents with  . Abdominal Pain    (Consider location/radiation/quality/duration/timing/severity/associated sxs/prior treatment) HPI This is a 21 year old white female with a 5 day history of nausea, vomiting and diarrhea. There is also epigastric pain associated with it that radiates to the right lower quadrant and vomiting. She states she's had a fever as high as 102.3. She states she's been taking ibuprofen and drinking Pedialyte without relief. She continues to vomit and have diarrhea. She thinks she may be dehydrated. Her symptoms are moderate. She says she thinks she may have passed the kidney stone but review of her CT scan from March 31 of this year showed no renal or ureteral stones present.  Past Medical History  Diagnosis Date  . IBS (irritable bowel syndrome)   . Bipolar 1 disorder   . Mononucleosis   . Kidney stones     Past Surgical History  Procedure Date  . Kidney stone surgery   . Tonsillectomy   . Inner ear surgery     No family history on file.  History  Substance Use Topics  . Smoking status: Current Everyday Smoker -- 10 years  . Smokeless tobacco: Not on file  . Alcohol Use: No    OB History    Grav Para Term Preterm Abortions TAB SAB Ect Mult Living                  Review of Systems  All other systems reviewed and are negative.    Allergies  Azithromycin and Prednisone  Home Medications   Current Outpatient Rx  Name Route Sig Dispense Refill  . NORGESTIMATE-ETH ESTRADIOL 0.25-35 MG-MCG PO TABS Oral Take 1 tablet by mouth daily.    Marland Kitchen OMEPRAZOLE 40 MG PO CPDR Oral Take 40 mg by mouth daily.    Marland Kitchen PROMETHAZINE HCL 25 MG PO TABS Oral Take 25 mg by mouth every 6 (six) hours as needed. For nausea    . CLONAZEPAM 1 MG PO TABS Oral Take 1 mg by mouth 2 (two) times daily as needed. For anxiety     . DOXYCYCLINE HYCLATE 100 MG PO CPEP Oral Take 100 mg by mouth 2 (two) times daily.    . OXYCODONE-ACETAMINOPHEN 10-325 MG PO TABS Oral Take 1 tablet by mouth every 4 (four) hours as needed.      BP 98/61  Pulse 88  Temp 98.1 F (36.7 C) (Oral)  Resp 18  Ht 5\' 4"  (1.626 m)  Wt 107 lb (48.535 kg)  BMI 18.37 kg/m2  SpO2 98%  LMP 03/13/2012  Physical Exam General: Well-developed, well-nourished female in no acute distress; appearance consistent with age of record HENT: normocephalic, atraumatic Eyes: pupils equal round and reactive to light; extraocular muscles intact Neck: supple Heart: regular rate and rhythm; tachycardia Lungs: clear to auscultation bilaterally Abdomen: soft; nondistended; epigastric and right lower quadrant tenderness; no masses or hepatosplenomegaly; bowel sounds present GU: Normal external genitalia; yellowish vaginal discharge; no vaginal bleeding; cervical motion tenderness; right adnexal tenderness Extremities: No deformity; full range of motion; pulses normal Neurologic: Awake, alert and oriented; motor function intact in all extremities and symmetric; no facial droop; tremulous Skin: Warm and dry     ED Course  Procedures (including critical care time)     MDM   Nursing notes and vitals signs, including  pulse oximetry, reviewed.  Summary of this visit's results, reviewed by myself:  Labs:  Results for orders placed during the hospital encounter of 04/01/12  URINALYSIS, ROUTINE W REFLEX MICROSCOPIC      Component Value Range   Color, Urine RED (*) YELLOW   APPearance CLOUDY (*) CLEAR   Specific Gravity, Urine 1.013  1.005 - 1.030   pH 7.5  5.0 - 8.0   Glucose, UA NEGATIVE  NEGATIVE mg/dL   Hgb urine dipstick LARGE (*) NEGATIVE   Bilirubin Urine NEGATIVE  NEGATIVE   Ketones, ur NEGATIVE  NEGATIVE mg/dL   Protein, ur NEGATIVE  NEGATIVE mg/dL   Urobilinogen, UA 0.2  0.0 - 1.0 mg/dL   Nitrite NEGATIVE  NEGATIVE   Leukocytes, UA  NEGATIVE  NEGATIVE  PREGNANCY, URINE      Component Value Range   Preg Test, Ur NEGATIVE  NEGATIVE  CBC WITH DIFFERENTIAL      Component Value Range   WBC 7.6  4.0 - 10.5 K/uL   RBC 3.48 (*) 3.87 - 5.11 MIL/uL   Hemoglobin 11.1 (*) 12.0 - 15.0 g/dL   HCT 40.9 (*) 81.1 - 91.4 %   MCV 92.5  78.0 - 100.0 fL   MCH 31.9  26.0 - 34.0 pg   MCHC 34.5  30.0 - 36.0 g/dL   RDW 78.2  95.6 - 21.3 %   Platelets 285  150 - 400 K/uL   Neutrophils Relative 60  43 - 77 %   Neutro Abs 4.6  1.7 - 7.7 K/uL   Lymphocytes Relative 32  12 - 46 %   Lymphs Abs 2.4  0.7 - 4.0 K/uL   Monocytes Relative 7  3 - 12 %   Monocytes Absolute 0.5  0.1 - 1.0 K/uL   Eosinophils Relative 2  0 - 5 %   Eosinophils Absolute 0.1  0.0 - 0.7 K/uL   Basophils Relative 0  0 - 1 %   Basophils Absolute 0.0  0.0 - 0.1 K/uL  COMPREHENSIVE METABOLIC PANEL      Component Value Range   Sodium 139  135 - 145 mEq/L   Potassium 3.6  3.5 - 5.1 mEq/L   Chloride 104  96 - 112 mEq/L   CO2 25  19 - 32 mEq/L   Glucose, Bld 82  70 - 99 mg/dL   BUN 8  6 - 23 mg/dL   Creatinine, Ser 0.86  0.50 - 1.10 mg/dL   Calcium 9.1  8.4 - 57.8 mg/dL   Total Protein 7.2  6.0 - 8.3 g/dL   Albumin 3.9  3.5 - 5.2 g/dL   AST 15  0 - 37 U/L   ALT 8  0 - 35 U/L   Alkaline Phosphatase 60  39 - 117 U/L   Total Bilirubin 0.6  0.3 - 1.2 mg/dL   GFR calc non Af Amer >90  >90 mL/min   GFR calc Af Amer >90  >90 mL/min  URINE MICROSCOPIC-ADD ON      Component Value Range   Squamous Epithelial / LPF FEW (*) RARE   WBC, UA 0-2  <3 WBC/hpf   RBC / HPF TOO NUMEROUS TO COUNT  <3 RBC/hpf   Bacteria, UA MANY (*) RARE  URINE RAPID DRUG SCREEN (HOSP PERFORMED)      Component Value Range   Opiates NONE DETECTED  NONE DETECTED   Cocaine NONE DETECTED  NONE DETECTED   Benzodiazepines NONE DETECTED  NONE DETECTED   Amphetamines NONE  DETECTED  NONE DETECTED   Tetrahydrocannabinol NONE DETECTED  NONE DETECTED   Barbiturates NONE DETECTED  NONE DETECTED  WET PREP,  GENITAL      Component Value Range   Yeast Wet Prep HPF POC NONE SEEN  NONE SEEN   Trich, Wet Prep NONE SEEN  NONE SEEN   Clue Cells Wet Prep HPF POC NONE SEEN  NONE SEEN   WBC, Wet Prep HPF POC MODERATE (*) NONE SEEN   6:37 AM Patient states she was treated for chlamydia 2 weeks ago with a course of doxycycline and was supposed to be retested midmonth. GC and chlamydia test swab obtained. Will presumptively treat for GC infection and obtain pelvic ultrasound.   Imaging Studies: No results found.          Hanley Seamen, MD 04/01/12 [phone]

## 2012-04-01 NOTE — ED Notes (Addendum)
C/o abd pain for the past week. Describes as sharp and states pain comes and goes. Points to mid abdomen and states it radiates to right lower abdomen. Has had vomiting and diarrhea as well. Fevers per pt. States last fever was 102.3  Yesterday morning. Has been taking motrin, drinking pedialyte.  resp even and unlabored.

## 2012-04-01 NOTE — ED Notes (Signed)
Pt. States no change in pain level after fentanyl. Dr. Read Drivers aware. No orders received at present.

## 2012-04-01 NOTE — ED Provider Notes (Signed)
Patient signed out to me with right lower quadrant pain. She also has hematuria. She has presented multiple times for evaluation for this. Dr. Christell Constant saw and evaluated her and ordered a pelvic ultrasound. She had some vaginal discharge and tenderness. He felt that she should be evaluated for pelvic abscess she had recently been treated for all the infection. She states that this was 2 weeks ago. She has continued to complain of severe pain here. She has had multiple doses of narcotic pain medicine but continues to complain of pain. Review of chart reveals at least 18 visits over the past year for pain related complaints. She is advised to use nonnarcotic 6 for pain and is given Toradol 30 mg IV here. She does have a urologist and is advised to followup with her urologist regarding her hematuria. I am culturing the urine. She will be treated with doxycycline on an outpatient as per Dr. Orvilla Fus' plan  Hilario Quarry, MD 04/01/12 [phone]

## 2012-04-02 LAB — GC/CHLAMYDIA PROBE AMP, GENITAL
Chlamydia, DNA Probe: NEGATIVE
GC Probe Amp, Genital: NEGATIVE

## 2012-04-02 LAB — URINE CULTURE: Colony Count: >=100000

## 2012-04-10 ENCOUNTER — Ambulatory Visit (INDEPENDENT_AMBULATORY_CARE_PROVIDER_SITE_OTHER): Payer: Managed Care, Other (non HMO) | Admitting: Internal Medicine

## 2012-04-10 ENCOUNTER — Encounter: Payer: Self-pay | Admitting: Internal Medicine

## 2012-04-10 VITALS — BP 112/66 | HR 94 | Temp 98.5°F | Wt 104.4 lb

## 2012-04-10 DIAGNOSIS — J209 Acute bronchitis, unspecified: Secondary | ICD-10-CM

## 2012-04-10 MED ORDER — HYDROCODONE-HOMATROPINE 5-1.5 MG/5ML PO SYRP: 5.0000 mL | ORAL_SOLUTION | Freq: Four times a day (QID) | ORAL | Status: AC | PRN

## 2012-04-10 MED ORDER — AMOXICILLIN 500 MG PO CAPS: 500.0000 mg | ORAL_CAPSULE | Freq: Three times a day (TID) | ORAL | Status: AC

## 2012-04-10 NOTE — Progress Notes (Signed)
  Subjective:    Patient ID: Tonya Davidson, female    DOB: 1990-12-28, 21 y.o.   MRN: [id number]  HPI As of 7/19 she developed a sore throat and head congestion. This was associated with fatigue. She also has had a productive cough with yellow green sputum & intermittent wheezing. She has had some intermittent discomfort in the left ear. She denies frontal headache, facial pain, or nasal purulence at this time. She continues to have some pharyngitis symptoms.  The cough was severe enough that she had syncope on 7/20  Mucinex DM and NyQuil have not been of benefit She has a history of childhood asthma. Until the last couple of days; she has averaged a half a pack of asthma    Review of Systems She experienced diarrhea and vomiting with mucoid material 7/18-7/21. This aggravated her hemorrhoidal issues     Objective:   Physical Exam General appearance:thin but adequately nourished; no acute distress or increased work of breathing is present.  No  lymphadenopathy about the head, neck, or axilla noted.   Eyes: No conjunctival inflammation or lid edema is present.   Ears:  External ear exam shows no significant lesions or deformities.  Otoscopic examination reveals clear canals, tympanic membranes are intact bilaterally without bulging, retraction, inflammation or discharge.Slight wax on L  Nose:  External nasal examination shows no deformity or inflammation. Nasal mucosa are pink and moist without lesions or exudates. No septal dislocation or deviation.No obstruction to airflow.   Oral exam: Dental hygiene is good; lips and gums are healthy appearing.There is no oropharyngeal erythema or exudate noted.  Hoarse  Neck:  No deformities, thyromegaly, masses, or tenderness noted.    Heart:  Normal rate and regular rhythm. S1 and S2 normal without gallop, murmur, click, rub . S 4   Lungs:Chest clear to auscultation; no wheezes, rhonchi,rales ,or rubs present.No increased work of breathing.  Dry  cough  Abdomen: Scaphoid; normal bowel sounds. No organomegaly or masses  Extremities:  No cyanosis, edema, or clubbing  noted    Skin: Warm & dry ; multiple tattoes          Assessment & Plan:  #1 acute bronchitis w/o bronchospasm  #2 acute gastroenteritis, resolved. She'll be given samples of Align while on antibiotics  Plan: See orders and recommendations

## 2012-04-10 NOTE — Patient Instructions (Addendum)
Please take the probiotic , Align, every day until the bowels are normal. This will replace the normal bacteria which  are necessary for formation of normal stool and processing of food. 

## 2012-04-11 ENCOUNTER — Encounter (HOSPITAL_BASED_OUTPATIENT_CLINIC_OR_DEPARTMENT_OTHER): Payer: Self-pay

## 2012-04-11 ENCOUNTER — Ambulatory Visit (INDEPENDENT_AMBULATORY_CARE_PROVIDER_SITE_OTHER): Payer: Managed Care, Other (non HMO) | Admitting: Family Medicine

## 2012-04-11 ENCOUNTER — Emergency Department (HOSPITAL_BASED_OUTPATIENT_CLINIC_OR_DEPARTMENT_OTHER)
Admission: EM | Admit: 2012-04-11 | Discharge: 2012-04-11 | Disposition: A | Discharge status: Home / Self Care | Payer: Managed Care, Other (non HMO) | Attending: Emergency Medicine | Admitting: Emergency Medicine

## 2012-04-11 ENCOUNTER — Telehealth: Payer: Self-pay

## 2012-04-11 ENCOUNTER — Encounter: Payer: Self-pay | Admitting: Family Medicine

## 2012-04-11 ENCOUNTER — Emergency Department (HOSPITAL_BASED_OUTPATIENT_CLINIC_OR_DEPARTMENT_OTHER): Payer: Managed Care, Other (non HMO)

## 2012-04-11 ENCOUNTER — Telehealth: Payer: Self-pay | Admitting: Internal Medicine

## 2012-04-11 ENCOUNTER — Ambulatory Visit (HOSPITAL_BASED_OUTPATIENT_CLINIC_OR_DEPARTMENT_OTHER): Admission: RE | Admit: 2012-04-11 | Payer: Managed Care, Other (non HMO) | Source: Ambulatory Visit

## 2012-04-11 VITALS — BP 100/60 | HR 91 | Temp 97.8°F | Wt 107.6 lb

## 2012-04-11 DIAGNOSIS — R109 Unspecified abdominal pain: Secondary | ICD-10-CM

## 2012-04-11 DIAGNOSIS — R300 Dysuria: Secondary | ICD-10-CM

## 2012-04-11 DIAGNOSIS — B9689 Other specified bacterial agents as the cause of diseases classified elsewhere: Secondary | ICD-10-CM

## 2012-04-11 DIAGNOSIS — F319 Bipolar disorder, unspecified: Secondary | ICD-10-CM | POA: Insufficient documentation

## 2012-04-11 DIAGNOSIS — A499 Bacterial infection, unspecified: Secondary | ICD-10-CM | POA: Insufficient documentation

## 2012-04-11 DIAGNOSIS — N760 Acute vaginitis: Secondary | ICD-10-CM | POA: Insufficient documentation

## 2012-04-11 DIAGNOSIS — K589 Irritable bowel syndrome without diarrhea: Secondary | ICD-10-CM | POA: Insufficient documentation

## 2012-04-11 DIAGNOSIS — B373 Candidiasis of vulva and vagina: Secondary | ICD-10-CM

## 2012-04-11 DIAGNOSIS — Z8619 Personal history of other infectious and parasitic diseases: Secondary | ICD-10-CM

## 2012-04-11 DIAGNOSIS — Z79899 Other long term (current) drug therapy: Secondary | ICD-10-CM | POA: Insufficient documentation

## 2012-04-11 DIAGNOSIS — B3731 Acute candidiasis of vulva and vagina: Secondary | ICD-10-CM

## 2012-04-11 DIAGNOSIS — F17200 Nicotine dependence, unspecified, uncomplicated: Secondary | ICD-10-CM | POA: Insufficient documentation

## 2012-04-11 DIAGNOSIS — N390 Urinary tract infection, site not specified: Secondary | ICD-10-CM | POA: Insufficient documentation

## 2012-04-11 LAB — CBC WITH DIFFERENTIAL/PLATELET
HCT: 34.7 % — ABNORMAL LOW (ref 36.0–46.0)
Hemoglobin: 12 g/dL (ref 12.0–15.0)
Lymphocytes Relative: 41 % (ref 12–46)
Lymphs Abs: 2.4 10*3/uL (ref 0.7–4.0)
MCHC: 34.6 g/dL (ref 30.0–36.0)
Monocytes Absolute: 0.4 10*3/uL (ref 0.1–1.0)
Monocytes Relative: 7 % (ref 3–12)
Neutro Abs: 2.8 10*3/uL (ref 1.7–7.7)
RBC: 3.71 MIL/uL — ABNORMAL LOW (ref 3.87–5.11)
WBC: 6 10*3/uL (ref 4.0–10.5)

## 2012-04-11 LAB — PREGNANCY, URINE: Preg Test, Ur: NEGATIVE

## 2012-04-11 LAB — URINALYSIS, ROUTINE W REFLEX MICROSCOPIC
Glucose, UA: NEGATIVE mg/dL
Hgb urine dipstick: NEGATIVE
Ketones, ur: 15 mg/dL — AB
Protein, ur: NEGATIVE mg/dL
pH: 5 (ref 5.0–8.0)

## 2012-04-11 LAB — BASIC METABOLIC PANEL
BUN: 9 mg/dL (ref 6–23)
CO2: 25 mEq/L (ref 19–32)
Chloride: 101 mEq/L (ref 96–112)
Creatinine, Ser: 0.6 mg/dL (ref 0.50–1.10)
Glucose, Bld: 83 mg/dL (ref 70–99)

## 2012-04-11 LAB — URINE MICROSCOPIC-ADD ON

## 2012-04-11 LAB — POCT URINE PREGNANCY: Preg Test, Ur: NEGATIVE

## 2012-04-11 MED ORDER — CEFTRIAXONE SODIUM 1 G IJ SOLR
1.0000 g | Freq: Once | INTRAMUSCULAR | Status: AC
  Administered 2012-04-11: 1 g via INTRAMUSCULAR

## 2012-04-11 MED ORDER — METRONIDAZOLE 0.75 % VA GEL: VAGINAL | Status: DC

## 2012-04-11 MED ORDER — FLUCONAZOLE 100 MG PO TABS: 100.0000 mg | ORAL_TABLET | Freq: Every day | ORAL | Status: AC

## 2012-04-11 MED ORDER — NITROFURANTOIN MONOHYD MACRO 100 MG PO CAPS: 100.0000 mg | ORAL_CAPSULE | Freq: Two times a day (BID) | ORAL | Status: DC

## 2012-04-11 MED ORDER — TRAMADOL HCL 50 MG PO TABS: 50.0000 mg | ORAL_TABLET | Freq: Three times a day (TID) | ORAL | Status: AC | PRN

## 2012-04-11 MED ORDER — IOHEXOL 300 MG/ML  SOLN
100.0000 mL | Freq: Once | INTRAMUSCULAR | Status: AC | PRN
  Administered 2012-04-11: 100 mL via INTRAVENOUS

## 2012-04-11 MED ORDER — LEVOFLOXACIN 500 MG PO TABS: 500.0000 mg | ORAL_TABLET | Freq: Every day | ORAL | Status: AC

## 2012-04-11 MED ORDER — ONDANSETRON HCL 4 MG/2ML IJ SOLN
4.0000 mg | Freq: Once | INTRAMUSCULAR | Status: AC
  Administered 2012-04-11: 4 mg via INTRAVENOUS
  Filled 2012-04-11: qty 2

## 2012-04-11 MED ORDER — MORPHINE SULFATE 4 MG/ML IJ SOLN
4.0000 mg | Freq: Once | INTRAMUSCULAR | Status: AC
  Administered 2012-04-11: 4 mg via INTRAVENOUS
  Filled 2012-04-11: qty 1

## 2012-04-11 NOTE — ED Provider Notes (Signed)
Medical screening examination/treatment/procedure(s) were performed by non-physician practitioner and as supervising physician I was immediately available for consultation/collaboration.  Ethelda Chick, MD 04/11/12 [phone]

## 2012-04-11 NOTE — Telephone Encounter (Signed)
Call from mother and she wanted to know why we were sending the patient to the the hospital, I explained to the mother that patient was complaining of 10/10 abdominal pain, so we sent her to the ED for a CT scan, she wanted to know how soon will it be done, I made her aware it was schduled and it will be done and read tonight, I made her aware Dr. Laury Axon just wanted to be sure that patient did not have appendicitis and the mother wanted to know what would do if so, I told if it was positive then surgery would be done, she wanted to know why we gave the patient the shot, I made her aware it was an Abx to cover the other symptoms. She voiced understanding and thanked me.     KP

## 2012-04-11 NOTE — Telephone Encounter (Signed)
Please advise 

## 2012-04-11 NOTE — ED Notes (Signed)
Patient transported to CT 

## 2012-04-11 NOTE — ED Notes (Signed)
Pt reports continued lower abdominal pain at 10/10. NP aware and no new orders received.

## 2012-04-11 NOTE — Patient Instructions (Addendum)

## 2012-04-11 NOTE — ED Notes (Signed)
Pt was seen by PCP yesterday and today-dx with bronchitis-was also dx with UTI-given abx injection today to treat UTI-started on amoxil-seen again today- states she is in the building for CT scan to r/o appendicitis-currently has CT contrast cup-pt decided to be seen as ED pt for cont'd "to my pee hole" and RLQ pain and vomiting

## 2012-04-11 NOTE — Progress Notes (Signed)
  Subjective:     Tonya Davidson is a 21 y.o. female who presents for evaluation of abdominal pain. Onset was 11 days--- but she told the cma it was only 2-3 days.   . Symptoms have been gradually worsening. The pain is described as sharp, and is 10/10 in intensity. Pain is located in the LLQ, RLQ and suprapubic region without radiation.  Aggravating factors: activity.  Alleviating factors: none. Associated symptoms: anorexia, dysuria, frequency, nausea and vomiting. The patient denies belching, chills, constipation, diarrhea and fever.  The patient's history has been marked as reviewed and updated as appropriate.  Review of Systems Pertinent items are noted in HPI.     Objective:    BP 100/60  Pulse 91  Temp 97.8 F (36.6 C) (Oral)  Wt 107 lb 9.6 oz (48.807 kg)  SpO2 97% General appearance: alert, cooperative, appears stated age and severe distress Abdomen: abnormal findings:  distended, guarding and marked tenderness in the entire abdomen Pelvic: deferred----done in ER Lymph nodes: Cervical, supraclavicular, and axillary nodes normal.    Assessment:    Abdominal pain, likely secondary to PID vs kidney stone .    Plan:    See orders for lab and imaging studies. Further follow-up plans will be based on outcome of lab/imaging studies; see orders.  Pt pain was out of proportion to exam--- scheduled for CT but when pt given instructions for CT she didn't want to go because "it is my birthday and I want to go out to dinner with my family.  I just want meds (pain meds)."  When pt was in the lab and in renee's office she was cursing out [employer] and the others.  Pt left angry knowing about her prescriptions and CT scan.

## 2012-04-11 NOTE — ED Provider Notes (Signed)
History     CSN: [id number]  Arrival date & time 04/11/12  1755   First MD Initiated Contact with Patient 04/11/12 1816      No chief complaint on file.   (Consider location/radiation/quality/duration/timing/severity/associated sxs/prior treatment) HPI Comments: Pt was seen by her pcp today and was sent over to have an outpt ct to rule out an appendicitis:pt states that she was vomiting the contrast and she wants her "pee hole"looked at so she decided to check in:pt states that she was treat for uti and bv by pcp today:pt states that she just really need something for pain  Patient is a 21 y.o. female presenting with abdominal pain. The history is provided by the patient. No language interpreter was used.  Abdominal Pain The primary symptoms of the illness include abdominal pain, nausea and vomiting. The primary symptoms of the illness do not include fever, diarrhea or dysuria. The current episode started 2 days ago. The onset of the illness was gradual. The problem has not changed since onset.   Past Medical History  Diagnosis Date  . IBS (irritable bowel syndrome)   . Bipolar 1 disorder   . Mononucleosis   . Kidney stones     Past Surgical History  Procedure Date  . Kidney stone surgery   . Tonsillectomy   . Inner ear surgery   . Wisdom tooth extraction     No family history on file.  History  Substance Use Topics  . Smoking status: Current Everyday Smoker -- 10 years  . Smokeless tobacco: Not on file  . Alcohol Use: No    OB History    Grav Para Term Preterm Abortions TAB SAB Ect Mult Living                  Review of Systems  Constitutional: Negative for fever.  Respiratory: Negative.   Cardiovascular: Negative.   Gastrointestinal: Positive for nausea, vomiting and abdominal pain. Negative for diarrhea.  Genitourinary: Negative for dysuria.    Allergies  Azithromycin and Prednisone  Home Medications   Current Outpatient Rx  Name Route Sig Dispense  Refill  . CLONAZEPAM 1 MG PO TABS Oral Take 1 mg by mouth 2 (two) times daily as needed. For anxiety    . DOXYCYCLINE HYCLATE 100 MG PO CPEP Oral Take 100 mg by mouth 2 (two) times daily.    Marland Kitchen DOXYCYCLINE HYCLATE 100 MG PO CAPS Oral Take 1 capsule (100 mg total) by mouth 2 (two) times daily. 20 capsule 0  . NORGESTIMATE-ETH ESTRADIOL 0.25-35 MG-MCG PO TABS Oral Take 1 tablet by mouth daily.    Marland Kitchen OMEPRAZOLE 40 MG PO CPDR Oral Take 40 mg by mouth daily.    . OXYCODONE-ACETAMINOPHEN 10-325 MG PO TABS Oral Take 1 tablet by mouth every 4 (four) hours as needed.    Marland Kitchen PROMETHAZINE HCL 25 MG PO TABS Oral Take 25 mg by mouth every 6 (six) hours as needed. For nausea      BP 114/66  Pulse 92  Temp 98.2 F (36.8 C) (Oral)  Resp 16  Ht 5\' 4"  (1.626 m)  Wt 107 lb (48.535 kg)  BMI 18.37 kg/m2  SpO2 100%  LMP 03/13/2012  Physical Exam  Nursing note and vitals reviewed. Constitutional: She is oriented to person, place, and time. She appears well-developed and well-nourished.  HENT:  Head: Normocephalic and atraumatic.  Cardiovascular: Normal rate and regular rhythm.   Pulmonary/Chest: Effort normal and breath sounds normal.  Abdominal: Soft. Bowel  sounds are normal.       rlq tenderness  Genitourinary:       Thick white vaginal discharge  Musculoskeletal: Normal range of motion.  Neurological: She is alert and oriented to person, place, and time.  Skin: Skin is warm and dry.    ED Course  Procedures (including critical care time)  Labs Reviewed  URINALYSIS, ROUTINE W REFLEX MICROSCOPIC - Abnormal; Notable for the following:    Color, Urine ORANGE (*)  BIOCHEMICALS MAY BE AFFECTED BY COLOR   Ketones, ur 15 (*)     Nitrite POSITIVE (*)     Leukocytes, UA TRACE (*)     All other components within normal limits  CBC WITH DIFFERENTIAL - Abnormal; Notable for the following:    RBC 3.71 (*)     HCT 34.7 (*)     All other components within normal limits  URINE MICROSCOPIC-ADD ON -  Abnormal; Notable for the following:    Bacteria, UA FEW (*)     All other components within normal limits  WET PREP, GENITAL - Abnormal; Notable for the following:    Yeast Wet Prep HPF POC RARE (*)     Clue Cells Wet Prep HPF POC MODERATE (*)     WBC, Wet Prep HPF POC MODERATE (*)     All other components within normal limits  PREGNANCY, URINE  BASIC METABOLIC PANEL  URINE CULTURE  GC/CHLAMYDIA PROBE AMP, GENITAL   Ct Abdomen Pelvis W Contrast  04/11/2012  *RADIOLOGY REPORT*  Clinical Data: Abdominal pain.  Right lower quadrant pain and vomiting.  History.  The bowel syndrome.  History of kidney stones.  CT ABDOMEN AND PELVIS WITH CONTRAST  Technique:  Multidetector CT imaging of the abdomen and pelvis was performed following the standard protocol during bolus administration of intravenous contrast.  Contrast: OMNIPAQUE IOHEXOL 300 MG/ML  SOLN  Comparison: Pelvic ultrasound 04/01/2012 at [facility]. CT abdomen pelvis without contrast to 12/10/00/13 at [facility].  Findings: Minimal dependent atelectasis is present at the lung bases bilaterally.  The heart size is normal.  No significant pleural or pericardial effusion is present.  The liver and spleen are within normal limits.  The stomach, duodenum, and pancreas are within normal limits.  The common bile duct and gallbladder are normal.  The adrenal glands are normal bilaterally.  The kidneys are unremarkable.  A moderate stool burden is present throughout the colon.  No focal inflammatory changes evident.  The appendix is visualized and within normal limits.  There is mild distention of distal small bowel.  No significant adenopathy or free fluid is present.  The uterus and adnexa are within normal limits for age.  The bone windows are unremarkable.  IMPRESSION:  1.  Moderate stool burden. 2.  Mild distention of distal small bowel suggests some degree of ileus or partial obstruction. 3.  No evidence for appendicitis.   Original Report Authenticated By: Jamesetta Orleans. MATTERN, M.D.     1. Abdominal pain   2. BV (bacterial vaginosis)   3. Candidal vaginitis   4. UTI (lower urinary tract infection)       MDM  Abdomen not acute:pt very upset that narcotics not given to go home with:pt was treated for uti and bv at pcp today       Teressa Lower, NP 04/11/12 2035

## 2012-04-11 NOTE — Telephone Encounter (Signed)
Caller: Mayra/Patient; PCP: Willow Ora; CB#: [phone];  Call regarding Urinary Pain; Onset: 04/10/12.  Temp 101.3 po at 0950. LMP 6/end/13.  BCP.  Dysuria present with bilateral flank pain, low backache and lower abdominal pain. Seen 04/10/12; started Amox for bronchitis. Reports took Pyridium and Ibuprofen. Advised to see MD within 4 hrs for flank pain and low back pain with urinary tract symptoms per [place] Pain Guideline.  Appt scheduled for 1500 04/11/12 with Dr Laury Axon.

## 2012-04-12 LAB — BASIC METABOLIC PANEL
BUN: 10 mg/dL (ref 6–23)
Creatinine, Ser: 0.7 mg/dL (ref 0.4–1.2)
GFR: 113.96 mL/min (ref >60.00)
Potassium: 4 mEq/L (ref 3.5–5.1)

## 2012-04-12 LAB — HEPATIC FUNCTION PANEL
ALT: 14 U/L (ref 0–35)
AST: 18 U/L (ref 0–37)
Alkaline Phosphatase: 58 U/L (ref 39–117)
Bilirubin, Direct: 0 mg/dL (ref 0.0–0.3)
Total Bilirubin: 0.3 mg/dL (ref 0.3–1.2)

## 2012-04-12 LAB — CBC WITH DIFFERENTIAL/PLATELET
Basophils Relative: 1 % (ref 0.0–3.0)
Eosinophils Relative: 4.9 % (ref 0.0–5.0)
Monocytes Relative: 7.8 % (ref 3.0–12.0)
Neutrophils Relative %: 45.7 % (ref 43.0–77.0)
Platelets: 241 10*3/uL (ref 150.0–400.0)
RBC: 4 Mil/uL (ref 3.87–5.11)
WBC: 6 10*3/uL (ref 4.5–10.5)

## 2012-04-12 LAB — GC/CHLAMYDIA PROBE AMP, GENITAL
Chlamydia, DNA Probe: NEGATIVE
GC Probe Amp, Genital: NEGATIVE

## 2012-04-12 LAB — URINE CULTURE
Colony Count: NO GROWTH
Culture: NO GROWTH

## 2012-04-13 LAB — GC/CHLAMYDIA PROBE AMP, URINE
Chlamydia, Swab/Urine, PCR: NEGATIVE
GC Probe Amp, Urine: NEGATIVE

## 2012-04-13 LAB — URINE CULTURE: Organism ID, Bacteria: NO GROWTH

## 2012-04-17 ENCOUNTER — Encounter (HOSPITAL_BASED_OUTPATIENT_CLINIC_OR_DEPARTMENT_OTHER): Payer: Self-pay | Admitting: Emergency Medicine

## 2012-04-17 ENCOUNTER — Emergency Department (HOSPITAL_BASED_OUTPATIENT_CLINIC_OR_DEPARTMENT_OTHER)
Admission: EM | Admit: 2012-04-17 | Discharge: 2012-04-17 | Disposition: A | Discharge status: Home / Self Care | Payer: Managed Care, Other (non HMO) | Attending: Emergency Medicine | Admitting: Emergency Medicine

## 2012-04-17 DIAGNOSIS — R197 Diarrhea, unspecified: Secondary | ICD-10-CM

## 2012-04-17 DIAGNOSIS — Z87442 Personal history of urinary calculi: Secondary | ICD-10-CM | POA: Insufficient documentation

## 2012-04-17 DIAGNOSIS — R1110 Vomiting, unspecified: Secondary | ICD-10-CM | POA: Insufficient documentation

## 2012-04-17 DIAGNOSIS — K589 Irritable bowel syndrome without diarrhea: Secondary | ICD-10-CM | POA: Insufficient documentation

## 2012-04-17 DIAGNOSIS — F319 Bipolar disorder, unspecified: Secondary | ICD-10-CM | POA: Insufficient documentation

## 2012-04-17 LAB — POCT I-STAT, CHEM 8
Creatinine, Ser: 0.8 mg/dL (ref 0.50–1.10)
HCT: 39 % (ref 36.0–46.0)
Hemoglobin: 13.3 g/dL (ref 12.0–15.0)
Potassium: 3.6 mEq/L (ref 3.5–5.1)
Sodium: 142 mEq/L (ref 135–145)
TCO2: 20 mmol/L (ref 0–100)

## 2012-04-17 LAB — CBC WITH DIFFERENTIAL/PLATELET
Basophils Absolute: 0 10*3/uL (ref 0.0–0.1)
HCT: 33.2 % — ABNORMAL LOW (ref 36.0–46.0)
Hemoglobin: 11.5 g/dL — ABNORMAL LOW (ref 12.0–15.0)
Lymphocytes Relative: 23 % (ref 12–46)
Monocytes Absolute: 0.7 10*3/uL (ref 0.1–1.0)
Monocytes Relative: 8 % (ref 3–12)
Neutro Abs: 6.5 10*3/uL (ref 1.7–7.7)
Neutrophils Relative %: 68 % (ref 43–77)
RDW: 11.8 % (ref 11.5–15.5)
WBC: 9.5 10*3/uL (ref 4.0–10.5)

## 2012-04-17 LAB — URINALYSIS, ROUTINE W REFLEX MICROSCOPIC
Bilirubin Urine: NEGATIVE
Leukocytes, UA: NEGATIVE
Nitrite: NEGATIVE
Specific Gravity, Urine: 1.015 (ref 1.005–1.030)
Urobilinogen, UA: 0.2 mg/dL (ref 0.0–1.0)
pH: 6.5 (ref 5.0–8.0)

## 2012-04-17 LAB — PREGNANCY, URINE: Preg Test, Ur: NEGATIVE

## 2012-04-17 MED ORDER — LOPERAMIDE HCL 2 MG PO CAPS
2.0000 mg | ORAL_CAPSULE | ORAL | Status: DC | PRN
  Administered 2012-04-17: 2 mg via ORAL
  Filled 2012-04-17: qty 1

## 2012-04-17 MED ORDER — KETOROLAC TROMETHAMINE 60 MG/2ML IM SOLN
60.0000 mg | Freq: Once | INTRAMUSCULAR | Status: AC
  Administered 2012-04-17: 60 mg via INTRAMUSCULAR
  Filled 2012-04-17: qty 2

## 2012-04-17 MED ORDER — ONDANSETRON 8 MG PO TBDP: ORAL_TABLET | ORAL | Status: AC

## 2012-04-17 MED ORDER — ONDANSETRON 8 MG PO TBDP
8.0000 mg | ORAL_TABLET | Freq: Once | ORAL | Status: AC
  Administered 2012-04-17: 8 mg via ORAL
  Filled 2012-04-17: qty 1

## 2012-04-17 NOTE — ED Notes (Signed)
Pt states she has taken ibuprofen and was seen by ENT today.

## 2012-04-17 NOTE — ED Notes (Signed)
No vomiting noted while in ED.

## 2012-04-17 NOTE — ED Notes (Signed)
Pt appears to be under the influence of narcotics, pt with slow speech, drowsy and clumsy motor skills.

## 2012-04-17 NOTE — ED Notes (Signed)
Pt unable to provide urine sample at this time 

## 2012-04-17 NOTE — ED Notes (Signed)
Pt reports multiple episodes of diarrhea x 2 days and vomiting x 4 episodes. Pt has not taken any immodium for diarrhea

## 2012-04-17 NOTE — ED Provider Notes (Signed)
History     CSN: [id number]  Arrival date & time 04/17/12  0309   First MD Initiated Contact with Patient 04/17/12 0319      Chief Complaint  Patient presents with  . Diarrhea  . Emesis    (Consider location/radiation/quality/duration/timing/severity/associated sxs/prior treatment) Patient is a 21 y.o. female presenting with diarrhea and vomiting. The history is provided by the patient. No language interpreter was used.  Diarrhea The primary symptoms include vomiting and diarrhea. Primary symptoms do not include fever, hematemesis or dysuria. The illness began 2 days ago. The onset was sudden. The problem has not changed since onset. The vomiting began 2 days ago. Vomiting occurs 2 to 5 times per day. The emesis contains stomach contents.  The illness does not include chills or anorexia. Significant associated medical issues include irritable bowel syndrome. Associated medical issues do not include alcohol abuse. Risk factors: none.  Emesis  Associated symptoms include diarrhea. Pertinent negatives include no chills and no fever.    Past Medical History  Diagnosis Date  . IBS (irritable bowel syndrome)   . Bipolar 1 disorder   . Mononucleosis   . Kidney stones     Past Surgical History  Procedure Date  . Kidney stone surgery   . Tonsillectomy   . Inner ear surgery   . Wisdom tooth extraction     No family history on file.  History  Substance Use Topics  . Smoking status: Current Everyday Smoker -- 10 years  . Smokeless tobacco: Not on file  . Alcohol Use: No    OB History    Grav Para Term Preterm Abortions TAB SAB Ect Mult Living                  Review of Systems  Constitutional: Negative for fever and chills.  Respiratory: Negative for shortness of breath.   Cardiovascular: Negative for chest pain.  Gastrointestinal: Positive for vomiting and diarrhea. Negative for anorexia and hematemesis.  Genitourinary: Negative for dysuria and pelvic pain.  All  other systems reviewed and are negative.    Allergies  Azithromycin and Prednisone  Home Medications   Current Outpatient Rx  Name Route Sig Dispense Refill  . CLONAZEPAM 1 MG PO TABS Oral Take 1-2 mg by mouth 3 (three) times daily. For anxiety    . FLUCONAZOLE 100 MG PO TABS Oral Take 1 tablet (100 mg total) by mouth daily. 1 tablet 0  . NORGESTIMATE-ETH ESTRADIOL 0.25-35 MG-MCG PO TABS Oral Take 1 tablet by mouth daily.    Marland Kitchen OMEPRAZOLE 40 MG PO CPDR Oral Take 40 mg by mouth daily.    Marland Kitchen PROMETHAZINE HCL 25 MG PO TABS Oral Take 25 mg by mouth every 6 (six) hours as needed. For nausea    . ZOLPIDEM TARTRATE 10 MG PO TABS Oral Take 10 mg by mouth at bedtime as needed. For sleep      BP 109/75  Pulse 95  Temp 98.1 F (36.7 C) (Oral)  Resp 18  SpO2 100%  LMP 03/13/2012  Physical Exam  Constitutional: She is oriented to person, place, and time. She appears well-developed and well-nourished.  HENT:  Head: Normocephalic and atraumatic.  Eyes: Conjunctivae are normal. Pupils are equal, round, and reactive to light.  Neck: Normal range of motion. Neck supple.  Cardiovascular: Normal rate and regular rhythm.   Pulmonary/Chest: Effort normal and breath sounds normal. She has no wheezes. She has no rales.  Abdominal: Soft. Bowel sounds are normal. She exhibits  no distension. There is no rebound and no guarding.  Musculoskeletal: Normal range of motion.  Neurological: She is alert and oriented to person, place, and time.  Skin: Skin is warm and dry.  Psychiatric: Thought content normal.    ED Course  Procedures (including critical care time)   Labs Reviewed  CBC WITH DIFFERENTIAL  BASIC METABOLIC PANEL  URINALYSIS, ROUTINE W REFLEX MICROSCOPIC  PREGNANCY, URINE   No results found.   No diagnosis found.    MDM  Multiple visits for same within 6 months, multiple CT scan.  Exam and vital and labs reassuring no indication for imaging.  Patient to follow up with her GI  doctor for ongoing care.          Jasmine Awe, MD 04/17/12 [phone]

## 2012-04-18 ENCOUNTER — Encounter: Payer: Self-pay | Admitting: Internal Medicine

## 2012-04-18 ENCOUNTER — Telehealth: Payer: Self-pay | Admitting: Internal Medicine

## 2012-04-18 NOTE — Telephone Encounter (Signed)
Letter to be mailed by [facility] for dismissal notification

## 2012-04-18 NOTE — Telephone Encounter (Signed)
Forms and signed letter sent to [facility] for final processing patient dismissal

## 2012-04-18 NOTE — Telephone Encounter (Signed)
Pt came to get lab work done & was upset b/c Dr. Laury Axon did not prescribe the pain med that she wanted & started being verbally abusive about Dr. Laury Axon & KP.

## 2012-04-23 ENCOUNTER — Telehealth: Payer: Self-pay | Admitting: *Deleted

## 2012-04-23 NOTE — Telephone Encounter (Signed)
Pt mom left VM that she would like a call back in reference to her daughter. No other info given, Left message to call office.

## 2012-04-26 NOTE — Telephone Encounter (Signed)
Pt mom calling to request result of CT scan done 04-11-12. Pt has 2 chart due to name change Pt result of CT are in the following chart:0 Durnell,Yuvonne DOB Jan 04, 1991 MRN: [id number]. Pt mom aware physcian have gone for today and will forward for advise on Monday.  Pt mom notes that Pt has since been back to hosp and Dx with Pelvic inflammatory disease due to chlamydia undiagnosed. Pt has since seen the GYN and is being treated for this condition.   Will f/u on Monday how chart can be combine if any since Pt has two last names due to name change by mom marriage and is unsure which name Pt would like to use.

## 2012-04-27 NOTE — Telephone Encounter (Signed)
Pt left CT without having test done and we did GC and chlamydia as well and it was neg at that time.

## 2012-04-28 ENCOUNTER — Encounter (HOSPITAL_COMMUNITY): Payer: Self-pay | Admitting: Obstetrics & Gynecology

## 2012-04-28 ENCOUNTER — Inpatient Hospital Stay (HOSPITAL_COMMUNITY)
Admission: AD | Admit: 2012-04-28 | Discharge: 2012-04-30 | DRG: 759 | Disposition: A | Discharge status: Home / Self Care | Payer: Managed Care, Other (non HMO) | Source: Ambulatory Visit | Attending: Obstetrics & Gynecology | Admitting: Obstetrics & Gynecology

## 2012-04-28 DIAGNOSIS — N730 Acute parametritis and pelvic cellulitis: Secondary | ICD-10-CM | POA: Diagnosis present

## 2012-04-28 DIAGNOSIS — A749 Chlamydial infection, unspecified: Secondary | ICD-10-CM | POA: Diagnosis present

## 2012-04-28 DIAGNOSIS — K5289 Other specified noninfective gastroenteritis and colitis: Secondary | ICD-10-CM | POA: Diagnosis present

## 2012-04-28 DIAGNOSIS — A5619 Other chlamydial genitourinary infection: Secondary | ICD-10-CM | POA: Diagnosis present

## 2012-04-28 DIAGNOSIS — K589 Irritable bowel syndrome without diarrhea: Secondary | ICD-10-CM | POA: Diagnosis present

## 2012-04-28 DIAGNOSIS — A0472 Enterocolitis due to Clostridium difficile, not specified as recurrent: Secondary | ICD-10-CM | POA: Diagnosis present

## 2012-04-28 DIAGNOSIS — N739 Female pelvic inflammatory disease, unspecified: Principal | ICD-10-CM | POA: Diagnosis present

## 2012-04-28 HISTORY — DX: Acute parametritis and pelvic cellulitis: N73.0

## 2012-04-28 HISTORY — DX: Chlamydial infection, unspecified: A74.9

## 2012-04-28 LAB — DIFFERENTIAL
Basophils Absolute: 0 10*3/uL (ref 0.0–0.1)
Lymphocytes Relative: 41 % (ref 12–46)
Lymphs Abs: 2.3 10*3/uL (ref 0.7–4.0)
Monocytes Absolute: 0.4 10*3/uL (ref 0.1–1.0)
Monocytes Relative: 7 % (ref 3–12)
Neutro Abs: 2.6 10*3/uL (ref 1.7–7.7)

## 2012-04-28 LAB — CBC
HCT: 34 % — ABNORMAL LOW (ref 36.0–46.0)
Hemoglobin: 11.6 g/dL — ABNORMAL LOW (ref 12.0–15.0)
RBC: 3.62 MIL/uL — ABNORMAL LOW (ref 3.87–5.11)
WBC: 5.5 10*3/uL (ref 4.0–10.5)

## 2012-04-28 MED ORDER — HYDROMORPHONE HCL PF 1 MG/ML IJ SOLN
1.0000 mg | Freq: Once | INTRAMUSCULAR | Status: AC
  Administered 2012-04-28: 1 mg via INTRAVENOUS
  Filled 2012-04-28: qty 1

## 2012-04-28 MED ORDER — DEXTROSE 5 % IV SOLN
1.0000 g | Freq: Four times a day (QID) | INTRAVENOUS | Status: DC
  Administered 2012-04-28 – 2012-04-29 (×4): 1 g via INTRAVENOUS
  Filled 2012-04-28 (×5): qty 1

## 2012-04-28 MED ORDER — IBUPROFEN 600 MG PO TABS
600.0000 mg | ORAL_TABLET | Freq: Four times a day (QID) | ORAL | Status: DC | PRN
  Administered 2012-04-30: 600 mg via ORAL
  Filled 2012-04-28 (×2): qty 1

## 2012-04-28 MED ORDER — DEXTROSE-NACL 5-0.9 % IV SOLN
INTRAVENOUS | Status: DC
  Administered 2012-04-28 – 2012-04-30 (×4): via INTRAVENOUS

## 2012-04-28 MED ORDER — HYDROCODONE-ACETAMINOPHEN 5-325 MG PO TABS
1.0000 | ORAL_TABLET | ORAL | Status: DC | PRN
  Filled 2012-04-28: qty 2

## 2012-04-28 MED ORDER — OXYCODONE-ACETAMINOPHEN 5-325 MG PO TABS
1.0000 | ORAL_TABLET | ORAL | Status: DC | PRN
  Administered 2012-04-29 (×3): 2 via ORAL
  Filled 2012-04-28 (×3): qty 2

## 2012-04-28 MED ORDER — PROMETHAZINE HCL 25 MG/ML IJ SOLN: 12.5000 mg | Freq: Once | INTRAMUSCULAR | Status: DC

## 2012-04-28 MED ORDER — METRONIDAZOLE IN NACL 5-0.79 MG/ML-% IV SOLN
500.0000 mg | Freq: Three times a day (TID) | INTRAVENOUS | Status: DC
  Administered 2012-04-28 – 2012-04-29 (×3): 500 mg via INTRAVENOUS
  Filled 2012-04-28 (×4): qty 100

## 2012-04-28 MED ORDER — PROMETHAZINE HCL 25 MG PO TABS
12.5000 mg | ORAL_TABLET | Freq: Four times a day (QID) | ORAL | Status: DC | PRN
  Administered 2012-04-28: 12.5 mg via ORAL
  Filled 2012-04-28: qty 1

## 2012-04-28 MED ORDER — DEXTROSE 5 % IV SOLN
100.0000 mg | Freq: Two times a day (BID) | INTRAVENOUS | Status: DC
  Administered 2012-04-28 – 2012-04-29 (×3): 100 mg via INTRAVENOUS
  Filled 2012-04-28 (×4): qty 100

## 2012-04-28 NOTE — Progress Notes (Signed)
Patient has claimed no relief from Dilaudid 1 mg IV given at 2102.  Dr. Juliene Pina in and at bedside.  Another 1mg  Diluadid ordered but when taken to patient room pt says; " I'm going to eat my salad and when I'm done I will call you for my med."

## 2012-04-28 NOTE — H&P (Signed)
Tonya Davidson is an 21 y.o. female admitted for inpatient management of PID since not tolerating PO abtibiotics and is in a "lot of pain".  Pelvic pain started in June'13 since she has been sexually active again with ex-fiance. She was seen in office on 04/26/12 by Dr Seymour Bars and was advised to continue Doxy/Flagyl that was started from ED on 04/25/12 (outside [place]). She was advised to call after 48 hrs if getting worse. She is having nausea for few days but started vomiting from 8/9 evening and is not sure if she is keeping any medication down. Rates her abdominal pain at 12/10; however she is able to give history without once stopping and abdo was not guarding while exam done when she was talking.  Says can't tolerate Percocet and Vicodin, gives her migraines, but "IV Dilaudid works".   Pt has recurrent Chlamydia infection, first treated in June'13 and now + again. Has been with 1 partner on 4 yrs, who is her ex-fiance and was having unprotected sex with him again since June since they were trying to get back together.  Was apparently treated in June with Doxy, thinks she threw up some and is not able to tolerate Azithromycin due to upset stomach. Pt says she was seen in [facility] ED in July for vag dc and was not called back for "chlamydia treatment" so she believes she was negative. Hence she thinks this + Chlam is a reinfection from ex-fiance.   Sexually active- no condoms BC-OCs Menses- regular, Patient's last menstrual period was 03/13/2012. STDs- chlamydia in June and Aug'13, no prior PID.  Smoker - daily, no illicit drugs IBS- GI doc in [place] Dr Pamella Pert Bipolar -Psych meds by PCP Dr Nelson Chimes in [place]   Past Medical History  Diagnosis Date  . IBS (irritable bowel syndrome)   . Bipolar 1 disorder   . Mononucleosis   . Kidney stones   . PID (acute pelvic inflammatory disease) 04/28/2012    Past Surgical History  Procedure Date  . Kidney stone surgery   . Tonsillectomy   .  Inner ear surgery   . Wisdom tooth extraction    No family history on file.  Social History:  reports that she has been smoking.  She does not have any smokeless tobacco history on file. She reports that she does not drink alcohol or use illicit drugs.  Allergies:  Allergies  Allergen Reactions  . Azithromycin Other (See Comments)    Reaction:GI upset  . Prednisone Other (See Comments)    Bi-polar,mood swings    Prescriptions prior to admission  Medication Sig Dispense Refill  . clonazePAM (KLONOPIN) 1 MG tablet Take 1-2 mg by mouth 3 (three) times daily. For anxiety      . norgestimate-ethinyl estradiol (ORTHO-CYCLEN,SPRINTEC,PREVIFEM) 0.25-35 MG-MCG tablet Take 1 tablet by mouth daily.      Marland Kitchen omeprazole (PRILOSEC) 40 MG capsule Take 40 mg by mouth daily.      . promethazine (PHENERGAN) 25 MG tablet Take 25 mg by mouth every 6 (six) hours as needed. For nausea      . zolpidem (AMBIEN) 10 MG tablet Take 10 mg by mouth at bedtime as needed. For sleep       Review of Systems  Constitutional: Positive for fever and chills.  Respiratory: Negative for cough.   Cardiovascular: Negative for chest pain.  Gastrointestinal: Positive for nausea, vomiting and abdominal pain.  Genitourinary: Positive for dysuria.  Neurological: Negative for dizziness and headaches.  Psychiatric/Behavioral: Negative  for suicidal ideas.    Physical Exam Blood pressure 106/74, pulse 100, temperature 98.7 F (37.1 C), temperature source Oral, resp. rate 20, last menstrual period 03/13/2012, SpO2 96.00%. A&O x 3, no acute distress. Does not appear to be in pain HEENT neg Lungs CTA bilat CV RRR,S1S2 normal Abdo soft, non tender, non acute, no rebound/guarding, active bowel sounds.  Extr no edema/ tenderness Pelvic not repeated today.   Assessment/Plan: PID pt, +Chlamydia, on Doxy, Flagyl with vomiting and worsening pain and fever, failed outpatient therapy, hence being admitted. IV Doxy/Flagyl/ Cefoxitin  x48 hrs and then outpt. Appears very stable for alleged pain at 12/10. But will give narcotic for pain relief now. Need to limit Dilaudid use, esp if pelvic sono tomorrow is negative/  Pelvic sono in AM, office sono on 8/9 was nl, no abscess then; plan to recheck since symptoms worse.  Get records from ED visit on July ([place]) to see if [facility] noted after June'13 treatment, to see if reinfection or untreated/resistance to Doxy since Jun'13.  Needs to stop frequent ED visits and instead establish good office f/up.  Partner needs to informed and treated.   Tonya Davidson 04/28/2012, 8:11 PM

## 2012-04-29 ENCOUNTER — Encounter (HOSPITAL_COMMUNITY): Payer: Self-pay | Admitting: *Deleted

## 2012-04-29 ENCOUNTER — Inpatient Hospital Stay (HOSPITAL_COMMUNITY): Payer: Managed Care, Other (non HMO)

## 2012-04-29 DIAGNOSIS — A749 Chlamydial infection, unspecified: Secondary | ICD-10-CM | POA: Diagnosis present

## 2012-04-29 DIAGNOSIS — A0472 Enterocolitis due to Clostridium difficile, not specified as recurrent: Secondary | ICD-10-CM | POA: Diagnosis present

## 2012-04-29 HISTORY — DX: Chlamydial infection, unspecified: A74.9

## 2012-04-29 LAB — CBC WITH DIFFERENTIAL/PLATELET
Basophils Relative: 1 % (ref 0–1)
HCT: 33.1 % — ABNORMAL LOW (ref 36.0–46.0)
Hemoglobin: 11 g/dL — ABNORMAL LOW (ref 12.0–15.0)
MCH: 32 pg (ref 26.0–34.0)
MCHC: 33.2 g/dL (ref 30.0–36.0)
Monocytes Absolute: 0.4 10*3/uL (ref 0.1–1.0)
Monocytes Relative: 8 % (ref 3–12)
Neutro Abs: 1.5 10*3/uL — ABNORMAL LOW (ref 1.7–7.7)

## 2012-04-29 LAB — COMPREHENSIVE METABOLIC PANEL
Albumin: 3.3 g/dL — ABNORMAL LOW (ref 3.5–5.2)
BUN: 6 mg/dL (ref 6–23)
Chloride: 104 mEq/L (ref 96–112)
Creatinine, Ser: 0.65 mg/dL (ref 0.50–1.10)
GFR calc Af Amer: >90 mL/min (ref >90)
GFR calc non Af Amer: >90 mL/min (ref >90)
Total Bilirubin: 0.4 mg/dL (ref 0.3–1.2)

## 2012-04-29 LAB — HCG, QUANTITATIVE, PREGNANCY: hCG, Beta Chain, Quant, S: <1 m[IU]/mL (ref <5)

## 2012-04-29 MED ORDER — HYDROMORPHONE HCL PF 1 MG/ML IJ SOLN
1.0000 mg | Freq: Once | INTRAMUSCULAR | Status: AC
  Administered 2012-04-29: 1 mg via INTRAVENOUS
  Filled 2012-04-29: qty 1

## 2012-04-29 MED ORDER — OXYCODONE-ACETAMINOPHEN 5-325 MG PO TABS
1.0000 | ORAL_TABLET | Freq: Three times a day (TID) | ORAL | Status: DC | PRN
  Administered 2012-04-29: 1 via ORAL
  Filled 2012-04-29: qty 1

## 2012-04-29 MED ORDER — CLONAZEPAM 0.5 MG PO TABS
1.0000 mg | ORAL_TABLET | Freq: Every day | ORAL | Status: DC
  Administered 2012-04-29: 1 mg via ORAL
  Filled 2012-04-29 (×2): qty 2

## 2012-04-29 MED ORDER — METRONIDAZOLE 500 MG PO TABS
500.0000 mg | ORAL_TABLET | Freq: Three times a day (TID) | ORAL | Status: DC
  Administered 2012-04-29 – 2012-04-30 (×2): 500 mg via ORAL
  Filled 2012-04-29 (×3): qty 1

## 2012-04-29 MED ORDER — OXYCODONE HCL 5 MG PO TABS
5.0000 mg | ORAL_TABLET | Freq: Once | ORAL | Status: AC
  Administered 2012-04-30: 5 mg via ORAL
  Filled 2012-04-29: qty 1

## 2012-04-29 MED ORDER — SODIUM CHLORIDE 0.9 % IV SOLN
8.0000 mg | Freq: Three times a day (TID) | INTRAVENOUS | Status: DC
  Administered 2012-04-29: 8 mg via INTRAVENOUS
  Filled 2012-04-29 (×4): qty 4

## 2012-04-29 MED ORDER — PROMETHAZINE HCL 25 MG PO TABS
25.0000 mg | ORAL_TABLET | Freq: Three times a day (TID) | ORAL | Status: DC | PRN
  Administered 2012-04-30 (×3): 25 mg via ORAL

## 2012-04-29 MED ORDER — CLONAZEPAM 0.5 MG PO TABS
1.0000 mg | ORAL_TABLET | Freq: Once | ORAL | Status: AC
  Administered 2012-04-30: 1 mg via ORAL
  Filled 2012-04-29: qty 2

## 2012-04-29 MED ORDER — PROMETHAZINE HCL 25 MG PO TABS
25.0000 mg | ORAL_TABLET | Freq: Every day | ORAL | Status: DC
  Administered 2012-04-29: 25 mg via ORAL
  Filled 2012-04-29 (×4): qty 1

## 2012-04-29 MED ORDER — OXYCODONE-ACETAMINOPHEN 5-325 MG PO TABS
2.0000 | ORAL_TABLET | ORAL | Status: DC | PRN
  Administered 2012-04-29 – 2012-04-30 (×6): 2 via ORAL
  Filled 2012-04-29 (×6): qty 2

## 2012-04-29 MED ORDER — PANTOPRAZOLE SODIUM 40 MG PO TBEC
80.0000 mg | DELAYED_RELEASE_TABLET | Freq: Every day | ORAL | Status: DC
  Administered 2012-04-29 – 2012-04-30 (×2): 80 mg via ORAL
  Filled 2012-04-29 (×3): qty 2

## 2012-04-29 MED ORDER — DEXTROSE 5 % IV SOLN
2.0000 g | Freq: Four times a day (QID) | INTRAVENOUS | Status: DC
  Administered 2012-04-30 (×2): 2 g via INTRAVENOUS
  Filled 2012-04-29 (×3): qty 2

## 2012-04-29 NOTE — Progress Notes (Signed)
Patient ID: Tonya Davidson, female   DOB: 06-25-1991, 21 y.o.   MRN: [id number] Subjective: " I am in a lot of pain and I vomited several times last night". No report from night RN re that. She tolerated dinner and morning breakfast (general diet and "warmed up" brownies with vanilla ice cream). Still rates her pain at 10/10. Also c/o diarrhea few times last night (ongoing for few days). No fever/chills.  Is complaining that her mother thinks she is hypochondriac due to frequent ED visits and the antibiotics she keeps taking for UTI/ bronchitis/ STDs. That she needs to see a Psychiatrist to treat her Bipolar disorder. Pt gives permission to discuss case with mother.    Objective: Vital signs in last 24 hours: Temp:  [97.6 F (36.4 C)-98.7 F (37.1 C)] 97.7 F (36.5 C) (08/12 1200) Pulse Rate:  [67-100] 67  (08/12 1200) Resp:  [17-20] 18  (08/12 1200) BP: (94-106)/(55-74) 94/55 mmHg (08/12 1200) SpO2:  [96 %-100 %] 98 % (08/12 1200) Weight:  [107 lb (48.535 kg)] 107 lb (48.535 kg) (08/12 0957) Weight change:  Last BM Date: 04/28/12  Intake/Output from previous day: 08/11 0701 - 08/12 0700 In: 475 [P.O.:475] Out: 600 [Urine:600]    Physical exam:  A&O x 3, talks without distress. Afebrile.  CV/Lungs -CTA bilat, RRR S1S2 nl, no tachycardia. Abdo soft, non tender in any quadrants. non acute, normal BS Extr no edema/ tenderness Pelvic deferred  Lab Results: normal WBC in [place] ED (8/6), in office (8/9) and today.   Basename 04/28/12 2044  WBC 5.5  HGB 11.6*  HCT 34.0*  PLT 284    Studies/Results: No pelvic abscess, small amount of fluid (that was seen last month and also per CT/sono report from ED on 8/6 in [place]).  US Transvaginal Non-ob  04/29/2012  *RADIOLOGY REPORT*  Clinical Data: Acute pelvic inflammatory disease.  TRANSVAGINAL ULTRASOUND OF PELVIS  Technique:  Transvaginal ultrasound examination of the pelvis was performed including evaluation of the  uterus, ovaries, adnexal regions, and pelvic cul-de-sac.  Comparison:  CT abdomen pelvis 04/11/2012 and pelvic ultrasound 04/01/2012  Findings:  Uterus:  Measures 6.9 x 2.7 x 3.5 cm.  Normal in echotexture.  No mass identified.  Endometrium: Normal in appearance and thickness.  Measures 6 mm.  Right ovary: Normal appearance/no adnexal mass.  Right ovary measures 2.9 x 1.7 x 1.5 cm.  Left ovary: Normal appearance/no adnexal mass left ovary measures 3.0 x 1.6 x 2.5 cm.  Other Findings:  There is a small amount of complex appearing free fluid in the right adnexa on image numbers 36 and 37, measuring approximately 1.7 x 0.9 x 1.7 cm.  Additionally, there is a small amount of simple-appearing free fluid in the cul-de-sac.  Of note, the left fallopian tube is visualized, and measures approximately 1.5 mm in diameter.  IMPRESSION: 1.  Small amount of probable complex appearing fluid in the right adnexa.  This measures approximately 1.7 cm greatest diameter.  The sterility of this fluid is uncertain.  No evidence for a large, drainable abscess is seen at this time. 2.  The left fallopian tube is visualized, but remains small in caliber, at 1.5 mm diameter. 3.  Normal appearance of the ovaries and uterus.  Original Report Authenticated By: Britta Mccreedy, M.D.    Outside records: faxed to Office from [facility] ED visit of 04/23/12 Chlamydia + and C.diff +   Assessment/Plan: LOS: 1 day  1) PID- No abscess on sono, nl WBC.  Diagnosis based on + Chlamydia and pt's subjective complaint of pain and tenderness perceived on exam.  However based on exam, nl VS, nl WBC and nl pelvic sono, is stable and may be ok for outpatient treatment after 48hrs. Needs PO medication challenge before leaving hospital. On Cefoxitin, Doxy, Flagyl (all IV).  Will not be giving anymore IV narcotics since pain is disproportionate to physical findings and will also decrease PO Percocet to 1 tab every 8 hrs and then consider stopping.  2) C.Diff  Colitis- noted per records from outside ED visit on 04/23/12.  Needs PO flagyl, will switch. May need H2 Blocker or antiemetic to help with PO Flagyl intake  Consider General Medicine transfer if inpatient treatment of C.diff advised.   3) Frequent Ed visits - mother is also concerned. Plan Psych referral outpatient.   Arasely Akkerman R 04/29/2012, 3:00 PM

## 2012-04-29 NOTE — Progress Notes (Signed)
Ur chart review completed.  

## 2012-04-29 NOTE — Telephone Encounter (Addendum)
Discussed with mother and I reviewed the labs and chart it looks like the patient has 2 charts. The patient had her CT done at the hospital and after walking out of the Med center. Per Dr.Low ne those results showed have been discussed at the ED. Heidi stated her daughter is at the [facility] hospital for treatment of PID due to untreated Chlamydia, I made her aware all of the testing that was done between [place] and the hospital were negative and it is possible that she had recently been exposed, mother state dthe patient was diagnosed and treated in June,s he is just trying to find out where the confusion is. She also wanted to discuss the CT. I made her aware this would have been discussed when she as in the ED.      KP

## 2012-04-29 NOTE — Progress Notes (Signed)
Admission nutrition screen triggered for dysphagia. . Patients chart reviewed and assessed  for nutritional risk. RN reports that the patient has no difficulty chewing or swallowing regular diet.  Patient is determined to be at low nutrition  risk.

## 2012-04-29 NOTE — Progress Notes (Signed)
Met with pt to review plan of care.  Pt states still in a lot of R sided pain. "Eating like a pig" and several empty trays noted in room. Pt also states "all day on the verge of throwing up." no emesis today. Pt notes several bouts of emesis and diarrhea yesterday. Pt notes loose but not watery stools x 2, "toothpaste consistency" No blood in stool, not watery, frothy, abnormal odor. Pt notes h/o IBS and her hemorrhoids acting up. Pt states L sided pain has gone away but now with suprapubic pain and the R sided pain has continued, w/o relief since 8/6.  Further notes reveal labs from 8/6 with C diff toxin on 1 of 2 stools and Chlamydia positive. Pt states since 8/6 has been doing metronidazole gel vaginally (does not tolerate po well due to nausea) and has continued on the oral doxy as well as her OCP.  PE: Filed Vitals:   04/29/12 1852  BP: 98/64  Pulse: 69  Temp: 97.6 F (36.4 C)  Resp: 18    Gen: appears well laughing and moving but when talking about her pain she starts to move slower and more guarded CV: RRR Pulm: CTAB Back: no CVAT Abd: pt notes tenderness in RUQ, LUQ, RLQ and LLQ, no suprapubic or  Umbilical tenderness on exam. No rebound, non-surgical, no distension, abd soft, pt w/ voluntary but not involuntary guarding.  GU: no bladder tenderness, pt tender in BLQ and CMT, no masses palpated but exam difficult as pt c/o pain LE: NT, no edema  A/P: BLQ pain, being treated for PID, also new finding of C Diff toxin positive on 8/6 - PID. Pt not tolerating outpt treatment due to her report of N/V, started IV meds last pm. Will cont IV meds for 48 hrs then assess for response. Will move to oral Flagyl (add antiemetic) given C diff. Pt report of pain out of proportion to labs, vitals and assessment of pt at times other than the exam. - C diff. Pt also w/ h/o colicitis and IBS. May need Gi consult if not improving  Jassmine Vandruff A. 04/29/2012 8:29 PM

## 2012-04-30 LAB — CBC
HCT: 32.5 % — ABNORMAL LOW (ref 36.0–46.0)
Hemoglobin: 10.8 g/dL — ABNORMAL LOW (ref 12.0–15.0)
MCH: 31.9 pg (ref 26.0–34.0)
MCV: 95.9 fL (ref 78.0–100.0)
RBC: 3.39 MIL/uL — ABNORMAL LOW (ref 3.87–5.11)

## 2012-04-30 MED ORDER — HYDROMORPHONE HCL PF 1 MG/ML IJ SOLN
1.0000 mg | INTRAMUSCULAR | Status: AC
  Administered 2012-04-30: 1 mg via INTRAVENOUS
  Filled 2012-04-30: qty 1

## 2012-04-30 MED ORDER — CLONAZEPAM 0.5 MG PO TABS
1.0000 mg | ORAL_TABLET | Freq: Four times a day (QID) | ORAL | Status: DC | PRN
  Administered 2012-04-30 (×2): 1 mg via ORAL
  Filled 2012-04-30: qty 2

## 2012-04-30 MED ORDER — OXYCODONE-ACETAMINOPHEN 5-325 MG PO TABS: 1.0000 | ORAL_TABLET | ORAL | Status: AC | PRN

## 2012-04-30 NOTE — Progress Notes (Signed)
Pt states worsening stomach pain since starting oral Flagyl. Pt states resolution of L sided pain but now w/ epigastric pain, RUQ pain and persistent RLQ pain. No umbilical pain or suprapubic pain. Pt hungry and eating everything she is given. No emesis though pt notes nausea. Pt states "diarrhea all night" though on further questioning she notes 4 stools of toothpaste consistency overnight. Pt has been instructed by several providers to call nurse for all outputs (stool, emesis, void) and nurse notes pt slept all night w/ no stools.   No fevers.  Further notes reveal labs from 8/6 with C diff toxin on 1 of 2 stools and Chlamydia positive. Pt states since 8/6 has been doing metronidazole gel vaginally (does not tolerate po well due to nausea) and has continued on the oral doxy as well as her OCP.   PE Filed Vitals:   04/29/12 1200 04/29/12 1852 04/29/12 2135 04/30/12 0655  BP: 94/55 98/64 103/60 104/67  Pulse: 67 69 65 73  Temp: 97.7 F (36.5 C) 97.6 F (36.4 C) 97.9 F (36.6 C) 97.9 F (36.6 C)  TempSrc: Oral Oral Oral Oral  Resp: 18 18 18 18   Height:      Weight:      SpO2: 98% 100% 99% 98%   Gen: pt moaning in pain when I entered room, sleeping prior and was resting comfortably with eyes closed when I was on phone tending to another matter. Pt later sitting up in bed moving freely discussing her need for pain meds. CV: RRR Pulm: CTAB Back: no CVAT Abd: no rebound, no guarding, abdomen soft, pt notes pain throughout, no masses palpated GU: no d/c, no blood, no bladder tenderness, pt note pain with palpation of adenxa and uterus LE: no edema  CBC    Component Value Date/Time   WBC 4.6 04/29/2012 2046   RBC 3.44* 04/29/2012 2046   HGB 11.0* 04/29/2012 2046   HCT 33.1* 04/29/2012 2046   PLT 262 04/29/2012 2046   MCV 96.2 04/29/2012 2046   MCH 32.0 04/29/2012 2046   MCHC 33.2 04/29/2012 2046   RDW 12.0 04/29/2012 2046   LYMPHSABS 2.5 04/29/2012 2046   MONOABS 0.4 04/29/2012 2046   EOSABS 0.2 04/29/2012 2046   BASOSABS 0.0 04/29/2012 2046    CMP     Component Value Date/Time   NA 137 04/29/2012 2046   K 4.0 04/29/2012 2046   CL 104 04/29/2012 2046   CO2 25 04/29/2012 2046   GLUCOSE 103* 04/29/2012 2046   BUN 6 04/29/2012 2046   CREATININE 0.65 04/29/2012 2046   CALCIUM 8.6 04/29/2012 2046   PROT 6.4 04/29/2012 2046   ALBUMIN 3.3* 04/29/2012 2046   AST 13 04/29/2012 2046   ALT 6 04/29/2012 2046   ALKPHOS 58 04/29/2012 2046   BILITOT 0.4 04/29/2012 2046   GFRNONAA >90 04/29/2012 2046   GFRAA >90 04/29/2012 2046     A/P: 21 yo with abdominal pain of entire abdomen. No fevers, no WBC, no nursing documentation of diarrhea or emesis in the past 24 hrs. Pt w/ h/o IBS and states ulcer on endoscopy in May and colitis on colonoscopy in May (has GI MD in [place]). Pt w/ C diff on stool sample from 8/6 (on 1 of 2 samples) but no watery diarrhea for the past week (per pt). Pt has been on a combination of oral, IV and vaginal metronidazole for more than 1 wk. I suspect the C diff is gone and I recc to stop the  oral Flagyl as it is contributing to her abdominal pain. Pt has been treated for PID for the past week- did IV meds on 8/6 for PID, then on oral doxy and vaginal Flagyl for the the past week. Since admission, pt has been given IV cefoxitin and a combination or oral and IV metronidazole and IV doxy. Pt has had adequate treatment for PID. U/s w/o evidence of abscess. While pt still complains of abdominal and pelvic pain, I think this is more generalized discomfort or peritoneal/ bowel irritation rather than PID. Pt w/ no fevers, tolerating regular po and normal WBC. I think the continued antibiotic treatment is exacerbating her GI irritation/ discomfort. At this time I plan to stop all antibiotics and watch pt for development of fever or leukocytosis.   Dispo- will continue to watch through the day today. If WBC does not elevate and pt remains AF will likely d/c later today for f/u with  GI and f/u office visit tomorrow. Will need to cont meds for GERD, OCP, start Zofran and give percocet for pain.   Lilymae Swiech A. 04/30/2012 9:14 AM

## 2012-04-30 NOTE — Progress Notes (Signed)
Pt repeatedly asking to leave the floor to go outside and sit, then patient called out and requested to go outside and smoke. Went in to pt's room and explained to her that because of the medications that caused sedation that she had been taking it was in her best interest to stay on the floor. I also explained that the Dr had encouraged that she stay on the floor too. Took AMA form into pt to have her sign if she wanted to leave and pt ripped the form up stating that she wanted to stay and just be taken care of. I also offered to call and get a nicotine patch for the pt and she said that the patch gives her nightmares and she just wanted to sleep at this time.

## 2012-04-30 NOTE — Progress Notes (Signed)
Pt requested that RN call MD to report that "it feels like someone is stabbing my stomach" & "my mom is here and will want to talk to Dr. Billy Coast".  Pt medicated with 2 Percocet tablete, 1mg  Klonopin, and 25 mg Phenergan fifteen minutes prior to request to phone MD.  Pt refused 600mg  Ibuprofen.  Phoned Dr. Billy Coast with above information.  No new orders received.  He stated he would come to see patient in a couple of hours with plans to discharge patient to home.

## 2012-04-30 NOTE — Progress Notes (Signed)
Subjective: Patient reports nausea, tolerating PO, + flatus and no problems voiding.    Objective: I have reviewed patient's vital signs, medications, labs and radiology results.  General: alert, cooperative and appears stated age Resp: clear to auscultation bilaterally and normal percussion bilaterally Cardio: regular rate and rhythm, S1, S2 normal, no murmur, click, rub or gallop GI: soft, non-tender; bowel sounds normal; no masses,  no organomegaly Extremities: extremities normal, atraumatic, no cyanosis or edema Vaginal Bleeding: none   Assessment/Plan: Abdominal pain of GI etx. DC home  Fu  With GI in AM  LOS: 2 days    Tonya Davidson 04/30/2012, 9:01 PM

## 2012-05-01 ENCOUNTER — Telehealth: Payer: Self-pay | Admitting: Internal Medicine

## 2012-05-01 ENCOUNTER — Encounter (HOSPITAL_COMMUNITY): Payer: Self-pay | Admitting: *Deleted

## 2012-05-01 LAB — URINE CULTURE: Colony Count: NO GROWTH

## 2012-05-01 NOTE — Telephone Encounter (Signed)
Patient dismissed from [facility] by Willow Ora, MD, effective 04/17/12. Dismissal letter sent out by certified / registered mail. [email or web address]

## 2012-05-02 ENCOUNTER — Telehealth: Payer: Self-pay | Admitting: Internal Medicine

## 2012-05-02 NOTE — Telephone Encounter (Signed)
Received signed return receipt verifying delivery of dismissal letter. Tracking number [id number]

## 2012-05-04 ENCOUNTER — Inpatient Hospital Stay (HOSPITAL_COMMUNITY)
Admission: AD | Admit: 2012-05-04 | Discharge: 2012-05-04 | Disposition: A | Discharge status: Home / Self Care | Payer: Managed Care, Other (non HMO) | Source: Ambulatory Visit | Attending: Obstetrics & Gynecology | Admitting: Obstetrics & Gynecology

## 2012-05-04 ENCOUNTER — Inpatient Hospital Stay (HOSPITAL_COMMUNITY): Payer: Managed Care, Other (non HMO)

## 2012-05-04 ENCOUNTER — Encounter (HOSPITAL_COMMUNITY): Payer: Self-pay | Admitting: Obstetrics and Gynecology

## 2012-05-04 DIAGNOSIS — R1031 Right lower quadrant pain: Secondary | ICD-10-CM | POA: Diagnosis present

## 2012-05-04 LAB — URINALYSIS, ROUTINE W REFLEX MICROSCOPIC
Glucose, UA: NEGATIVE mg/dL
Hgb urine dipstick: NEGATIVE
Specific Gravity, Urine: >1.03 — ABNORMAL HIGH (ref 1.005–1.030)
Urobilinogen, UA: 0.2 mg/dL (ref 0.0–1.0)

## 2012-05-04 LAB — CBC
HCT: 33.1 % — ABNORMAL LOW (ref 36.0–46.0)
Hemoglobin: 10.9 g/dL — ABNORMAL LOW (ref 12.0–15.0)
RDW: 12.6 % (ref 11.5–15.5)
WBC: 6.2 10*3/uL (ref 4.0–10.5)

## 2012-05-04 LAB — WET PREP, GENITAL

## 2012-05-04 LAB — URINE MICROSCOPIC-ADD ON

## 2012-05-04 MED ORDER — KETOROLAC TROMETHAMINE 30 MG/ML IJ SOLN
30.0000 mg | Freq: Once | INTRAMUSCULAR | Status: DC
  Filled 2012-05-04: qty 1

## 2012-05-04 MED ORDER — CEFTRIAXONE SODIUM 1 G IJ SOLR
1.0000 g | Freq: Once | INTRAMUSCULAR | Status: AC
  Administered 2012-05-04: 1 g via INTRAMUSCULAR
  Filled 2012-05-04: qty 10

## 2012-05-04 MED ORDER — OXYCODONE-ACETAMINOPHEN 5-325 MG PO TABS
1.0000 | ORAL_TABLET | Freq: Once | ORAL | Status: AC
  Administered 2012-05-04: 1 via ORAL
  Filled 2012-05-04: qty 1

## 2012-05-04 NOTE — Progress Notes (Signed)
Explained to pt that Dr. Juliene Pina will be here to see her in 10-15 minutes.  In the meantime, Dr. Juliene Pina ordered Toradol for pain.  Pt refused medication stating that, "Toradol never works for me.  It is only for kidney stones."  It was explained to pt at that time that Toradol is effective for GYN type pain as well.  Pt still refuses to get medication tearfully stating, "I'm sorry.  I just don't want it, because it doesn't work for me."
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
## 2012-05-04 NOTE — MAU Note (Signed)
Patient presents with c/o abdominal pain due to PID and C-Diff

## 2012-05-04 NOTE — MAU Note (Signed)
"  I was having a lot pain and was tested for Chlamydia.  I got treatment, but didn't get re-tested.  I was in [place] at the beginning of August and was dx'd with PID and was told to see my GYN.  I see Debbora Dus at [facility] and was referred to Dr. Seymour Bars.  Dr. Seymour Bars confirmed that I have PID.  Dr. Juliene Pina admitted me to the GYN floor last weekend.  In the meantime, they received papers from [facility] stating that I had C. Diff, so they started me on Metronidazole.  I f/u with my GI MD and he confirmed that I have C. Diff.  He never confirmed it by testing, but by word of mouth.  I've been taking my abx TID (500mg ).  Today is the 4th day.  I have to take it for 14 days.  I was given a couple Oxycodone by Dr. Billy Coast.  The pain on my RT side that is worse.  I took Motrin at 2000 last night, 2 acetominophen at 0400 this morning.  I called the triage RN from [facility] and she told me to take more Motrin.  I've tried all measures to avoid being here, but now I feel like I'm being stabbed.  My GI MD said that if the medications don't work that I will have to have my appendix taken out."

## 2012-05-04 NOTE — MAU Provider Note (Signed)
History     CSN: [id number]  Arrival date and time: 05/04/12 1331   First Provider Initiated Contact with Patient 05/04/12 1542      Chief Complaint  Patient presents with  . Abdominal Pain   HPI 21 yo, c/o RLQ pain since last night. Is "severe" and has tried Ibuprofen and Tylenol since she ran out of "few" Oxycodones given at discharge. Per RN she was not crying until I walked into the room. I have seen her at her recent admission. She was pointing and holding her hand over right groin area (not so much in RLQ of her abdomen). Denies fever/chills/ vomiting. Has some diarrhea, but has improved, she is being treated for C.diff infection diagnosed on 04/23/12 in [place] [facility] on 8/6. She is worried that she keeps having a lot of pain and needs to know what is going on. No sexual encounter since before 04/22/12. Is taking OCs (but may have had ovulation since LMP 2 wks back and she has been on several antibiotics)  She was also Chlamydia + and was treated and was diagnosed with PID and treated outpt for few days and then admitted at [facility] from 8/11 to 8/14. During admission she c/o vomiting without any RN witnessing it, her VS stable, WBC nl, pelvic sono essentially normal without pelvic abscess. She was tolerating all her meals without any difficulty. She received Dilaudid on 1st day and then Percocet for pain although abdomen was soft, non tender and no guarding. She was discharged on 8/14 with "few oxycodone tabs" since her exam was normal. She was discharged home with PO Flagyl to complete C.diff treatment and outpt f/up with her GI in [place] and PCP Dr Nelson Chimes. She saw her GI's PA on 8/15 and was told to complete Flagyl course and take Ibuprofen for pain. She has had upper and lower GI scopy with her GI recently.    Past Medical History  Diagnosis Date  . IBS (irritable bowel syndrome)   . Bipolar 1 disorder   . Mononucleosis   . Kidney stones   . PID (acute pelvic inflammatory  disease) 04/28/2012  . Chlamydia 04/29/2012    Past Surgical History  Procedure Date  . Kidney stone surgery   . Tonsillectomy   . Inner ear surgery   . Wisdom tooth extraction     History reviewed. No pertinent family history.  History  Substance Use Topics  . Smoking status: Current Everyday Smoker -- 0.5 packs/day for 10 years  . Smokeless tobacco: Not on file  . Alcohol Use: No    Allergies:  Allergies  Allergen Reactions  . Azithromycin Other (See Comments)    Reaction:GI upset  . Prednisone     Impacts Bipolar symptoms  . Prednisone Other (See Comments)    Bi-polar,mood swings  . Azithromycin     REACTION: abd pain    No prescriptions prior to admission    ROS as above  Physical Exam   Blood pressure 86/41, pulse 70, temperature 98.4 F (36.9 C), temperature source Oral, resp. rate 18, last menstrual period 04/18/2012.  Physical Exam A&O x 3, is crying with pain and curled up in bed with her right hand over right inguinal.groin area.  Lungs CTA bilat CV RRR, S1S2 normal; NO tachycardia Abdo soft, non tender, non acute, active normal BS, no rebound/guarding. No CVA tenderness Extr no edema/ tenderness Pelvic- Vaginal discharge- wet prep- WBC. Uterus non tender and no adnexal masses/ tenderness or CMT noted when patient  distracted.    MAU Course  Procedures CBC, UA, Pelvic sono. Declined Toradol (does not work) but stopped crying when I offered her ONE Oxycodone tab for "pain".  CBC- nl (incl nl WBC and H.H). UA- nitrates, few RBCs, will give Rocephin 1 gm IM in case of UTI and urine cul sent (defer PO meds since currently being treated for C.diff), if there is UTI (if at all), it is not the cause of "severe" pain.  Pelvic sono - negative again (this is her 3rd sono since [facility] ED visit on 8/6-1st in ED, 2nd here on 8/12 and 3rd today) with negative findings.   Assessment and Plan  21 yo, RLQ pain. Pain out of proportion to any significant exam  findings and tests (clinical, labs, sono).  I am not certain about the etiology of her pain, but is no Gynecologic. I am not certain about the degree of pain mentioned since clinically feels non acute. This is not PID related. Patient advised that Chlamydia test of cure can be done after 6 wks of treatment, to be done outpatient. F/up in office outpatient. Not sure if this is GI related either, especially with diarrhea getting better and not having any vomiting and completely non-acute abdominal exam. Advised to f/up with her GI next week.  We discussed referral to [facility] multidisciplinary Pelvic Pain clinic, she agrees. I will start referral process from office.  ** No Rx for narcotics.    Rawleigh Rode R 05/04/2012, 6:19 PM

## 2012-05-05 LAB — URINE CULTURE
Colony Count: NO GROWTH
Special Requests: NORMAL

## 2012-05-07 ENCOUNTER — Encounter: Payer: Self-pay | Admitting: *Deleted

## 2012-05-17 ENCOUNTER — Encounter (HOSPITAL_COMMUNITY): Payer: Self-pay | Admitting: Adult Health

## 2012-05-17 DIAGNOSIS — N390 Urinary tract infection, site not specified: Secondary | ICD-10-CM | POA: Insufficient documentation

## 2012-05-17 DIAGNOSIS — Z79899 Other long term (current) drug therapy: Secondary | ICD-10-CM | POA: Insufficient documentation

## 2012-05-17 DIAGNOSIS — R319 Hematuria, unspecified: Secondary | ICD-10-CM | POA: Insufficient documentation

## 2012-05-17 DIAGNOSIS — Z87442 Personal history of urinary calculi: Secondary | ICD-10-CM | POA: Insufficient documentation

## 2012-05-17 DIAGNOSIS — F17200 Nicotine dependence, unspecified, uncomplicated: Secondary | ICD-10-CM | POA: Insufficient documentation

## 2012-05-17 DIAGNOSIS — F319 Bipolar disorder, unspecified: Secondary | ICD-10-CM | POA: Insufficient documentation

## 2012-05-17 DIAGNOSIS — R109 Unspecified abdominal pain: Secondary | ICD-10-CM | POA: Insufficient documentation

## 2012-05-17 NOTE — ED Notes (Signed)
Reports Dx of PID at [facility] one month ago, 2 weeks ago admitted to MAU for PID. [facility] called with positive for C-diff. PT has taken 42 Flagyl for C-diff over the course of the treatment. Today c/o pain of pain on right side of lower abdomen, painful urination and back pain . Pain rated 9-10/10.  Taking advil without relief.

## 2012-05-18 ENCOUNTER — Emergency Department (HOSPITAL_COMMUNITY): Payer: Managed Care, Other (non HMO)

## 2012-05-18 ENCOUNTER — Emergency Department (HOSPITAL_COMMUNITY)
Admission: EM | Admit: 2012-05-18 | Discharge: 2012-05-18 | Disposition: A | Discharge status: Home / Self Care | Payer: Managed Care, Other (non HMO) | Attending: Emergency Medicine | Admitting: Emergency Medicine

## 2012-05-18 DIAGNOSIS — N390 Urinary tract infection, site not specified: Secondary | ICD-10-CM

## 2012-05-18 LAB — URINALYSIS, ROUTINE W REFLEX MICROSCOPIC
Bilirubin Urine: NEGATIVE
Glucose, UA: NEGATIVE mg/dL
Hgb urine dipstick: NEGATIVE
Ketones, ur: NEGATIVE mg/dL
Protein, ur: NEGATIVE mg/dL

## 2012-05-18 LAB — BASIC METABOLIC PANEL
BUN: 11 mg/dL (ref 6–23)
Calcium: 9.5 mg/dL (ref 8.4–10.5)
Creatinine, Ser: 0.9 mg/dL (ref 0.50–1.10)
GFR calc Af Amer: >90 mL/min (ref >90)

## 2012-05-18 LAB — CBC WITH DIFFERENTIAL/PLATELET
Basophils Absolute: 0.1 10*3/uL (ref 0.0–0.1)
Basophils Relative: 1 % (ref 0–1)
Eosinophils Absolute: 0.3 10*3/uL (ref 0.0–0.7)
Eosinophils Relative: 5 % (ref 0–5)
HCT: 39.4 % (ref 36.0–46.0)
MCH: 31.6 pg (ref 26.0–34.0)
MCHC: 33.2 g/dL (ref 30.0–36.0)
MCV: 94.9 fL (ref 78.0–100.0)
Monocytes Absolute: 0.4 10*3/uL (ref 0.1–1.0)
Monocytes Relative: 6 % (ref 3–12)
RDW: 12.6 % (ref 11.5–15.5)

## 2012-05-18 LAB — URINE MICROSCOPIC-ADD ON

## 2012-05-18 LAB — POCT PREGNANCY, URINE: Preg Test, Ur: NEGATIVE

## 2012-05-18 MED ORDER — CEPHALEXIN 250 MG PO CAPS
500.0000 mg | ORAL_CAPSULE | Freq: Once | ORAL | Status: AC
  Administered 2012-05-18: 500 mg via ORAL
  Filled 2012-05-18: qty 2

## 2012-05-18 MED ORDER — NAPROXEN 500 MG PO TABS: 500.0000 mg | ORAL_TABLET | Freq: Two times a day (BID) | ORAL | Status: DC

## 2012-05-18 MED ORDER — CEPHALEXIN 500 MG PO CAPS: 500.0000 mg | ORAL_CAPSULE | Freq: Four times a day (QID) | ORAL | Status: DC

## 2012-05-18 MED ORDER — HYDROMORPHONE HCL PF 1 MG/ML IJ SOLN
1.0000 mg | Freq: Once | INTRAMUSCULAR | Status: AC
  Administered 2012-05-18: 1 mg via INTRAVENOUS
  Filled 2012-05-18: qty 1

## 2012-05-18 MED ORDER — OXYCODONE-ACETAMINOPHEN 5-325 MG PO TABS
2.0000 | ORAL_TABLET | Freq: Once | ORAL | Status: AC
  Administered 2012-05-18: 2 via ORAL
  Filled 2012-05-18: qty 2

## 2012-05-18 MED ORDER — KETOROLAC TROMETHAMINE 30 MG/ML IJ SOLN
30.0000 mg | Freq: Once | INTRAMUSCULAR | Status: AC
  Administered 2012-05-18: 30 mg via INTRAVENOUS
  Filled 2012-05-18: qty 1

## 2012-05-18 MED ORDER — SODIUM CHLORIDE 0.9 % IV BOLUS (SEPSIS)
1000.0000 mL | Freq: Once | INTRAVENOUS | Status: AC
Start: 2012-05-18 — End: 2012-05-18
  Administered 2012-05-18: 1000 mL via INTRAVENOUS

## 2012-05-18 NOTE — ED Notes (Signed)
Pt c/o rlq pain for 2 months, burning with urination today, right flank pain and right groin pain todayrated 10/10.  No relief from ibuprofen.  Associated  With nausea, no vomiting, diarrhea completed ABX for C-diff

## 2012-05-18 NOTE — ED Provider Notes (Signed)
History     CSN: [id number]  Arrival date & time 05/17/12  2313   First MD Initiated Contact with Patient 05/18/12 0210      Chief Complaint  Patient presents with  . Urinary Tract Infection    (Consider location/radiation/quality/duration/timing/severity/associated sxs/prior treatment) HPI Comments: 21 year old female with a history of recent diagnosis of pelvic inflammatory disease secondary to chlamydia which has been treated with antibiotics. She had coexistent Clostridium difficile infection for which she has just finished a course of Flagyl. She followed up with her gastroenterologist today and it was thought that clinically she may still have the diarrheal illness however when she was giving a urine sample at the lab she developed acute onset of burning and pain in her vaginal area. She had some hematuria and since that time has had persistent mild lower abdominal pain. She has chronic right lower quadrant pain after getting the pelvic infection but this has not changed. She denies fevers, chills, swelling, rash, headache. She feels dehydrated because of all the fluid she is losing from her diarrhea.  Patient is a 21 y.o. female presenting with urinary tract infection. The history is provided by the patient and a relative.  Urinary Tract Infection    Past Medical History  Diagnosis Date  . IBS (irritable bowel syndrome)   . Bipolar 1 disorder   . Mononucleosis   . Kidney stones   . PID (acute pelvic inflammatory disease) 04/28/2012  . Chlamydia 04/29/2012    Past Surgical History  Procedure Date  . Kidney stone surgery   . Tonsillectomy   . Inner ear surgery   . Wisdom tooth extraction     History reviewed. No pertinent family history.  History  Substance Use Topics  . Smoking status: Current Everyday Smoker -- 0.5 packs/day for 10 years  . Smokeless tobacco: Not on file  . Alcohol Use: No    OB History    Grav Para Term Preterm Abortions TAB SAB Ect Mult Living    0               Review of Systems  All other systems reviewed and are negative.    Allergies  Azithromycin; Prednisone; Prednisone; and Azithromycin  Home Medications   Current Outpatient Rx  Name Route Sig Dispense Refill  . CLONAZEPAM 1 MG PO TABS Oral Take 2 mg by mouth at bedtime.      . IBUPROFEN 200 MG PO TABS Oral Take 600 mg by mouth every 6 (six) hours as needed. pain    . NORGESTIMATE-ETH ESTRADIOL 0.25-35 MG-MCG PO TABS Oral Take 1 tablet by mouth daily.      Marland Kitchen OMEPRAZOLE 40 MG PO CPDR Oral Take 40 mg by mouth every morning.     Marland Kitchen PROMETHAZINE HCL 25 MG PO TABS Oral Take 25 mg by mouth every 8 (eight) hours as needed. For nausea    . ZOLPIDEM TARTRATE 10 MG PO TABS Oral Take 10 mg by mouth at bedtime as needed. For sleep    . CEPHALEXIN 500 MG PO CAPS Oral Take 1 capsule (500 mg total) by mouth 4 (four) times daily. 15 capsule 0  . NAPROXEN 500 MG PO TABS Oral Take 1 tablet (500 mg total) by mouth 2 (two) times daily with a meal. 30 tablet 0    BP 93/60  Pulse 80  Temp 97.4 F (36.3 C) (Oral)  Resp 18  SpO2 99%  LMP 05/11/2012  Physical Exam  Nursing note and vitals reviewed.  Constitutional: She appears well-developed and well-nourished. No distress.  HENT:  Head: Normocephalic and atraumatic.  Mouth/Throat: Oropharynx is clear and moist. No oropharyngeal exudate.  Eyes: Conjunctivae and EOM are normal. Pupils are equal, round, and reactive to light. Right eye exhibits no discharge. Left eye exhibits no discharge. No scleral icterus.  Neck: Normal range of motion. Neck supple. No JVD present. No thyromegaly present.  Cardiovascular: Normal rate, regular rhythm, normal heart sounds and intact distal pulses.  Exam reveals no gallop and no friction rub.   No murmur heard. Pulmonary/Chest: Effort normal and breath sounds normal. No respiratory distress. She has no wheezes. She has no rales.  Abdominal: Soft. Bowel sounds are normal. She exhibits no distension  and no mass. There is tenderness ( Suprapubic and right lower quadrant tenderness, no guarding, no peritoneal signs).  Musculoskeletal: Normal range of motion. She exhibits no edema and no tenderness.  Lymphadenopathy:    She has no cervical adenopathy.  Neurological: She is alert. Coordination normal.  Skin: Skin is warm and dry. No rash noted. No erythema.  Psychiatric: She has a normal mood and affect. Her behavior is normal.    ED Course  Procedures (including critical care time)  Labs Reviewed  URINALYSIS, ROUTINE W REFLEX MICROSCOPIC - Abnormal; Notable for the following:    APPearance CLOUDY (*)     Leukocytes, UA LARGE (*)     All other components within normal limits  BASIC METABOLIC PANEL - Abnormal; Notable for the following:    Glucose, Bld 138 (*)     GFR calc non Af Amer 89 (*)     All other components within normal limits  URINE MICROSCOPIC-ADD ON - Abnormal; Notable for the following:    Squamous Epithelial / LPF MANY (*)     Bacteria, UA MANY (*)     Crystals CA OXALATE CRYSTALS (*)     All other components within normal limits  CBC WITH DIFFERENTIAL  POCT PREGNANCY, URINE  URINE CULTURE   Ct Abdomen Pelvis Wo Contrast  05/18/2012  *RADIOLOGY REPORT*  Clinical Data: urinary tract infection.  Right flank pain  CT ABDOMEN AND PELVIS WITHOUT CONTRAST  Technique:  Multidetector CT imaging of the abdomen and pelvis was performed following the standard protocol without intravenous contrast.  Comparison: 04/11/2012  Findings: Lung bases are clear.  No pericardial or pleural effusion.  No focal liver abnormality.  Gallbladder is negative. No biliary dilatation.  The pancreas is normal.  The spleen is normal.  Both adrenal glands appear normal.  The kidneys both appear normal. No nephrolithiasis or obstructive uropathy.  Urinary bladder appears normal.  The uterus and adnexal structures have a normal physiologic appearance  The stomach appears normal.  The small bowel loops  have a normal course and caliber.  No evidence for obstruction.  The appendix is difficult to visualize separate from the right lower quadrant bowel loops.  No secondary signs of acute appendicitis.  The colon appears normal.  There is no free fluid or fluid collections within the abdomen or pelvis.  There is no adenopathy within the upper abdomen or the pelvis.  Review of the visualized bony structures is unremarkable.  IMPRESSION:  1.  No acute findings.   Original Report Authenticated By: Rosealee Albee, M.D.      1. UTI (lower urinary tract infection)       MDM  Vital signs are unremarkable, there is no tachycardia or fever and the patient has no difficulty breathing. Her  abdomen is very soft and no peritoneal signs are present. She does have tenderness in the suprapubic and right lower quadrant areas but she states this has been this way for 2 weeks. She does have hematuria and calcium oxalate crystals as well as many bacteria, many squamous cells and 21-50 white blood cells. There may be an infection however I would also consider kidney stone given the acute onset of her symptoms. CT scan is pending, there is no leukocytosis. Pain medication and fluids have been ordered.   CT scan reviewed, there are no acute findings to suggest kidney stone or abscess. Is also no sign of appendicitis. Again her white blood cell count is 6200, the urine is consistent with urinary tract infection, culture sent, Keflex started.  Vida Roller, MD 05/18/12 [phone]

## 2012-05-19 LAB — URINE CULTURE: Colony Count: 75000

## 2012-05-22 ENCOUNTER — Encounter (HOSPITAL_COMMUNITY): Payer: Self-pay | Admitting: Emergency Medicine

## 2012-05-22 DIAGNOSIS — R109 Unspecified abdominal pain: Secondary | ICD-10-CM | POA: Insufficient documentation

## 2012-05-22 DIAGNOSIS — G8929 Other chronic pain: Secondary | ICD-10-CM | POA: Insufficient documentation

## 2012-05-22 LAB — CBC WITH DIFFERENTIAL/PLATELET
Basophils Absolute: 0.1 10*3/uL (ref 0.0–0.1)
Eosinophils Absolute: 0.2 10*3/uL (ref 0.0–0.7)
Eosinophils Relative: 3 % (ref 0–5)
Lymphocytes Relative: 32 % (ref 12–46)
Lymphs Abs: 2 10*3/uL (ref 0.7–4.0)
MCH: 31.6 pg (ref 26.0–34.0)
MCV: 93.3 fL (ref 78.0–100.0)
Neutrophils Relative %: 59 % (ref 43–77)
Platelets: 294 10*3/uL (ref 150–400)
RBC: 3.89 MIL/uL (ref 3.87–5.11)
RDW: 12.3 % (ref 11.5–15.5)
WBC: 6.1 10*3/uL (ref 4.0–10.5)

## 2012-05-22 LAB — COMPREHENSIVE METABOLIC PANEL
ALT: 14 U/L (ref 0–35)
AST: 19 U/L (ref 0–37)
Alkaline Phosphatase: 66 U/L (ref 39–117)
Calcium: 9.5 mg/dL (ref 8.4–10.5)
Potassium: 4.2 mEq/L (ref 3.5–5.1)
Sodium: 135 mEq/L (ref 135–145)
Total Protein: 7.7 g/dL (ref 6.0–8.3)

## 2012-05-22 LAB — URINALYSIS, ROUTINE W REFLEX MICROSCOPIC
Bilirubin Urine: NEGATIVE
Glucose, UA: NEGATIVE mg/dL
Specific Gravity, Urine: 1.017 (ref 1.005–1.030)
Urobilinogen, UA: 0.2 mg/dL (ref 0.0–1.0)

## 2012-05-22 LAB — URINE MICROSCOPIC-ADD ON

## 2012-05-22 NOTE — ED Notes (Signed)
Pt is positive C DiFF at [facility] hospital and PID, chlamydia. Followed up with GYN and was admitted and given IV antibiotics for STI's. Pain in RLQ and midline for months and followed up with GI MD. Got phone call today with positive c diff. Pt is here currently for vomiting and diarrh and stomach pain.

## 2012-05-23 ENCOUNTER — Emergency Department (HOSPITAL_COMMUNITY)
Admission: EM | Admit: 2012-05-23 | Discharge: 2012-05-23 | Disposition: A | Discharge status: Home / Self Care | Payer: Managed Care, Other (non HMO) | Attending: Emergency Medicine | Admitting: Emergency Medicine

## 2012-05-23 DIAGNOSIS — G8929 Other chronic pain: Secondary | ICD-10-CM

## 2012-05-23 MED ORDER — DICYCLOMINE HCL 10 MG/ML IM SOLN
20.0000 mg | Freq: Once | INTRAMUSCULAR | Status: DC
  Filled 2012-05-23: qty 2

## 2012-05-23 MED ORDER — ONDANSETRON 4 MG PO TBDP
4.0000 mg | ORAL_TABLET | Freq: Once | ORAL | Status: DC
  Filled 2012-05-23: qty 1

## 2012-05-23 MED ORDER — KETOROLAC TROMETHAMINE 60 MG/2ML IM SOLN
60.0000 mg | Freq: Once | INTRAMUSCULAR | Status: DC
  Filled 2012-05-23: qty 2

## 2012-05-23 NOTE — ED Notes (Signed)
No vomiting or diarrhea observed since arrival to treatment room.

## 2012-05-23 NOTE — ED Notes (Signed)
Pt refuses to try to drink anything.  States she will drink something on the way home.  Dr. Rosalia Hammers notified of same.

## 2012-05-23 NOTE — ED Notes (Signed)
Pt refused all medication.  States she has already taken them at home today without any success.  States she wants to be discharged home.  Dr. Rosalia Hammers notified of same. Will attempt PO trial.

## 2012-05-23 NOTE — ED Provider Notes (Addendum)
History     CSN: [id number]  Arrival date & time 05/22/12  4098   First MD Initiated Contact with Patient 05/23/12 0150      Chief Complaint  Patient presents with  . Abdominal Pain    (Consider location/radiation/quality/duration/timing/severity/associated sxs/prior treatment) HPI  Patient complaining of chronic abdominal pain states she had pid and c diff diagnosed a month ago.  States increased stooling today.  States diffuse abdominal pain and on bentyl and loperamide and pain worse today.  Patient states took all iv antibiotics for pid and no abnormal vaginal discharge.   GI  Dr. Jason Fila in [place].  Chronic abdominal pain with ibs 10 years with worse pain for two months.  Patient states no appetite and states lost 14 pound in past two weeks.  I feel like I'm dying.   Past Medical History  Diagnosis Date  . IBS (irritable bowel syndrome)   . Bipolar 1 disorder   . Mononucleosis   . Kidney stones   . PID (acute pelvic inflammatory disease) 04/28/2012  . Chlamydia 04/29/2012    Past Surgical History  Procedure Date  . Kidney stone surgery   . Tonsillectomy   . Inner ear surgery   . Wisdom tooth extraction     No family history on file.  History  Substance Use Topics  . Smoking status: Current Everyday Smoker -- 0.5 packs/day for 10 years  . Smokeless tobacco: Not on file  . Alcohol Use: No    OB History    Grav Para Term Preterm Abortions TAB SAB Ect Mult Living   0               Review of Systems  Constitutional: Positive for appetite change. Negative for fever.  HENT: Negative for neck pain and neck stiffness.   Eyes: Negative for discharge.  Respiratory: Negative for shortness of breath.   Gastrointestinal: Positive for vomiting and diarrhea.  Genitourinary: Negative for difficulty urinating.  Musculoskeletal: Negative for back pain.  Skin: Negative for rash.  Neurological: Negative for dizziness.    Allergies  Azithromycin; Prednisone;  Prednisone; and Azithromycin  Home Medications   Current Outpatient Rx  Name Route Sig Dispense Refill  . CLONAZEPAM 1 MG PO TABS Oral Take 2 mg by mouth at bedtime.      Marland Kitchen DICYCLOMINE HCL 10 MG PO CAPS Oral Take 10 mg by mouth 4 (four) times daily -  before meals and at bedtime.    . IBUPROFEN 200 MG PO TABS Oral Take 600 mg by mouth every 6 (six) hours as needed. For pain    . NORGESTIMATE-ETH ESTRADIOL 0.25-35 MG-MCG PO TABS Oral Take 1 tablet by mouth daily.      Marland Kitchen OMEPRAZOLE 40 MG PO CPDR Oral Take 40 mg by mouth every morning.     Marland Kitchen PROMETHAZINE HCL 25 MG PO TABS Oral Take 25 mg by mouth every 8 (eight) hours as needed. For nausea    . ZOLPIDEM TARTRATE 10 MG PO TABS Oral Take 10 mg by mouth at bedtime as needed. For sleep      BP 91/62  Pulse 96  Temp 98.4 F (36.9 C) (Oral)  Resp 18  SpO2 100%  LMP 05/22/2012  Physical Exam  Nursing note and vitals reviewed. Constitutional: She is oriented to person, place, and time. She appears well-developed and well-nourished.  HENT:  Head: Normocephalic and atraumatic.  Eyes: Pupils are equal, round, and reactive to light.  Neck: Normal range of motion. Neck  supple.  Cardiovascular: Normal rate and normal heart sounds.   Pulmonary/Chest: Effort normal and breath sounds normal.  Abdominal: Soft.       Diffuse lower abdominal tenderness  Musculoskeletal: Normal range of motion.  Neurological: She is alert and oriented to person, place, and time. She has normal reflexes.  Skin: Skin is warm and dry.    ED Course  Procedures (including critical care time)  Labs Reviewed  URINALYSIS, ROUTINE W REFLEX MICROSCOPIC - Abnormal; Notable for the following:    APPearance TURBID (*)     Hgb urine dipstick LARGE (*)     All other components within normal limits  URINE MICROSCOPIC-ADD ON - Abnormal; Notable for the following:    Squamous Epithelial / LPF MANY (*)     Bacteria, UA FEW (*)     All other components within normal limits    CBC WITH DIFFERENTIAL  COMPREHENSIVE METABOLIC PANEL  POCT PREGNANCY, URINE   No results found.   No diagnosis found.    MDM  Patient reportedly refused medications stating that she didn't want any medicines or not narcotics she refused to take an oral trial of fluid. She has not been seen vomiting or having diarrhea here. She has been hemodynamically stable. She refuses treatment and has signed out AGAINST MEDICAL ADVICE.        Hilario Quarry, MD 05/24/12 1610  Hilario Quarry, MD 07/29/12 [phone]

## 2012-05-28 ENCOUNTER — Encounter (HOSPITAL_COMMUNITY): Payer: Self-pay

## 2012-05-28 ENCOUNTER — Emergency Department (HOSPITAL_COMMUNITY)
Admission: EM | Admit: 2012-05-28 | Discharge: 2012-05-28 | Disposition: A | Discharge status: Home / Self Care | Payer: Managed Care, Other (non HMO) | Attending: Emergency Medicine | Admitting: Emergency Medicine

## 2012-05-28 DIAGNOSIS — A499 Bacterial infection, unspecified: Secondary | ICD-10-CM | POA: Insufficient documentation

## 2012-05-28 DIAGNOSIS — F17200 Nicotine dependence, unspecified, uncomplicated: Secondary | ICD-10-CM | POA: Insufficient documentation

## 2012-05-28 DIAGNOSIS — Z881 Allergy status to other antibiotic agents status: Secondary | ICD-10-CM | POA: Insufficient documentation

## 2012-05-28 DIAGNOSIS — N760 Acute vaginitis: Secondary | ICD-10-CM | POA: Insufficient documentation

## 2012-05-28 DIAGNOSIS — B9689 Other specified bacterial agents as the cause of diseases classified elsewhere: Secondary | ICD-10-CM | POA: Insufficient documentation

## 2012-05-28 DIAGNOSIS — F319 Bipolar disorder, unspecified: Secondary | ICD-10-CM | POA: Insufficient documentation

## 2012-05-28 DIAGNOSIS — N390 Urinary tract infection, site not specified: Secondary | ICD-10-CM | POA: Insufficient documentation

## 2012-05-28 DIAGNOSIS — N730 Acute parametritis and pelvic cellulitis: Secondary | ICD-10-CM

## 2012-05-28 HISTORY — DX: Enterocolitis due to Clostridium difficile, not specified as recurrent: A04.72

## 2012-05-28 LAB — CBC WITH DIFFERENTIAL/PLATELET
Basophils Relative: 0 % (ref 0–1)
Eosinophils Relative: 4 % (ref 0–5)
HCT: 33 % — ABNORMAL LOW (ref 36.0–46.0)
Hemoglobin: 11.4 g/dL — ABNORMAL LOW (ref 12.0–15.0)
MCH: 32 pg (ref 26.0–34.0)
MCHC: 34.5 g/dL (ref 30.0–36.0)
MCV: 92.7 fL (ref 78.0–100.0)
Monocytes Absolute: 0.4 10*3/uL (ref 0.1–1.0)
Monocytes Relative: 7 % (ref 3–12)
Neutro Abs: 2 10*3/uL (ref 1.7–7.7)

## 2012-05-28 LAB — WET PREP, GENITAL: Yeast Wet Prep HPF POC: NONE SEEN

## 2012-05-28 LAB — BASIC METABOLIC PANEL
BUN: 9 mg/dL (ref 6–23)
Calcium: 8.9 mg/dL (ref 8.4–10.5)
Chloride: 103 mEq/L (ref 96–112)
GFR calc non Af Amer: >90 mL/min (ref >90)

## 2012-05-28 LAB — POCT PREGNANCY, URINE: Preg Test, Ur: NEGATIVE

## 2012-05-28 LAB — URINE MICROSCOPIC-ADD ON

## 2012-05-28 LAB — URINALYSIS, ROUTINE W REFLEX MICROSCOPIC
Nitrite: NEGATIVE
Specific Gravity, Urine: 1.02 (ref 1.005–1.030)
Urobilinogen, UA: 0.2 mg/dL (ref 0.0–1.0)
pH: 6 (ref 5.0–8.0)

## 2012-05-28 MED ORDER — DOXYCYCLINE HYCLATE 100 MG PO CAPS: 100.0000 mg | ORAL_CAPSULE | Freq: Two times a day (BID) | ORAL | Status: AC

## 2012-05-28 MED ORDER — SULFAMETHOXAZOLE-TRIMETHOPRIM 800-160 MG PO TABS: 1.0000 | ORAL_TABLET | Freq: Two times a day (BID) | ORAL | Status: AC

## 2012-05-28 MED ORDER — OXYCODONE-ACETAMINOPHEN 5-325 MG PO TABS: 2.0000 | ORAL_TABLET | ORAL | Status: AC | PRN

## 2012-05-28 MED ORDER — METRONIDAZOLE 500 MG PO TABS: 500.0000 mg | ORAL_TABLET | Freq: Two times a day (BID) | ORAL | Status: AC

## 2012-05-28 MED ORDER — MORPHINE SULFATE 4 MG/ML IJ SOLN
4.0000 mg | Freq: Once | INTRAMUSCULAR | Status: AC
  Administered 2012-05-28: 4 mg via INTRAVENOUS
  Filled 2012-05-28: qty 1

## 2012-05-28 MED ORDER — HYDROMORPHONE HCL PF 1 MG/ML IJ SOLN
1.0000 mg | Freq: Once | INTRAMUSCULAR | Status: AC
  Administered 2012-05-28: 1 mg via INTRAVENOUS
  Filled 2012-05-28: qty 1

## 2012-05-28 MED ORDER — MORPHINE SULFATE 4 MG/ML IJ SOLN: 2.0000 mg | Freq: Once | INTRAMUSCULAR | Status: DC

## 2012-05-28 MED ORDER — ONDANSETRON HCL 4 MG/2ML IJ SOLN
4.0000 mg | INTRAMUSCULAR | Status: AC
  Administered 2012-05-28: 4 mg via INTRAVENOUS
  Filled 2012-05-28: qty 2

## 2012-05-28 NOTE — ED Provider Notes (Signed)
Discussed with me  Medical screening examination/treatment/procedure(s) were performed by non-physician practitioner and as supervising physician I was immediately available for consultation/collaboration. Devoria Albe, MD, Armando Gang   Ward Givens, MD 05/28/12 [phone]

## 2012-05-28 NOTE — ED Provider Notes (Signed)
History     CSN: [id number]  Arrival date & time 05/28/12  4098   First MD Initiated Contact with Patient 05/28/12 1105      Chief Complaint  Patient presents with  . Dysuria  . Pelvic Inflammatory Disease    6 weeks ago  . Abdominal Pain    (Consider location/radiation/quality/duration/timing/severity/associated sxs/prior treatment) HPI Comments: Patient presents with RLQ pain for the past 24 hours. She reports a gradual onset of pain, which is sharp and severe. The pain radiates to her right lower back and down her right leg. The pain is unrelieved by ibuprofen. She reports associated persistent nausea and fever up to 102 at home. She reports a history of PID. She denies vomiting, chest pain, shortness of breath.   Patient is a 21 y.o. female presenting with dysuria and abdominal pain.  Dysuria  Associated symptoms include nausea and hematuria. Pertinent negatives include no chills and no vomiting.  Abdominal Pain The primary symptoms of the illness include abdominal pain, fever, nausea, dysuria and vaginal discharge. The primary symptoms of the illness do not include fatigue, shortness of breath, vomiting or diarrhea.  The dysuria is associated with hematuria.  The vaginal discharge is associated with dysuria.  Additional symptoms associated with the illness include hematuria. Symptoms associated with the illness do not include chills, diaphoresis, constipation or back pain.    Past Medical History  Diagnosis Date  . IBS (irritable bowel syndrome)   . Bipolar 1 disorder   . Mononucleosis   . Kidney stones   . PID (acute pelvic inflammatory disease) 04/28/2012  . Chlamydia 04/29/2012  . C. difficile diarrhea     Past Surgical History  Procedure Date  . Kidney stone surgery   . Tonsillectomy   . Inner ear surgery   . Wisdom tooth extraction     No family history on file.  History  Substance Use Topics  . Smoking status: Current Everyday Smoker -- 0.5 packs/day for  10 years  . Smokeless tobacco: Not on file  . Alcohol Use: No    OB History    Grav Para Term Preterm Abortions TAB SAB Ect Mult Living   0               Review of Systems  Constitutional: Positive for fever. Negative for chills, diaphoresis and fatigue.  HENT: Negative for congestion and sinus pressure.   Eyes: Negative for photophobia and visual disturbance.  Respiratory: Negative for cough and shortness of breath.   Cardiovascular: Negative for chest pain.  Gastrointestinal: Positive for nausea and abdominal pain. Negative for vomiting, diarrhea and constipation.  Genitourinary: Positive for dysuria, hematuria, vaginal discharge and pelvic pain.  Musculoskeletal: Negative for back pain.  Skin: Negative for rash and wound.  Neurological: Negative for dizziness, weakness, numbness and headaches.    Allergies  Azithromycin; Prednisone; Prednisone; and Azithromycin  Home Medications   Current Outpatient Rx  Name Route Sig Dispense Refill  . CLONAZEPAM 1 MG PO TABS Oral Take 2 mg by mouth at bedtime.      Marland Kitchen DICYCLOMINE HCL 10 MG PO CAPS Oral Take 10 mg by mouth 4 (four) times daily -  before meals and at bedtime.    . IBUPROFEN 200 MG PO TABS Oral Take 600 mg by mouth every 6 (six) hours as needed. For pain    . NORGESTIMATE-ETH ESTRADIOL 0.25-35 MG-MCG PO TABS Oral Take 1 tablet by mouth daily.      Marland Kitchen OMEPRAZOLE 40 MG PO  CPDR Oral Take 40 mg by mouth every morning.     Marland Kitchen PROMETHAZINE HCL 25 MG PO TABS Oral Take 25 mg by mouth at bedtime as needed. For nausea    . ZOLPIDEM TARTRATE 10 MG PO TABS Oral Take 10 mg by mouth at bedtime as needed. For sleep      BP 112/61  Pulse 76  Temp 98.2 F (36.8 C) (Oral)  Resp 16  SpO2 100%  LMP 05/22/2012  Physical Exam  Nursing note and vitals reviewed. Constitutional: She is oriented to person, place, and time. She appears well-developed and well-nourished. No distress.  HENT:  Head: Normocephalic and atraumatic.  Mouth/Throat:  Oropharynx is clear and moist. No oropharyngeal exudate.  Eyes: Conjunctivae and EOM are normal. Pupils are equal, round, and reactive to light. No scleral icterus.  Neck: Normal range of motion. Neck supple.  Cardiovascular: Normal rate and regular rhythm.  Exam reveals no gallop and no friction rub.   No murmur heard. Pulmonary/Chest: Effort normal and breath sounds normal. No respiratory distress. She has no wheezes. She has no rales. She exhibits no tenderness.  Abdominal: Soft. There is tenderness. There is no rebound and no guarding.       Notable RLQ tenderness to palpation. Generalized, mild tenderness to palpation of abdomen.   Genitourinary:       Moderate white vaginal discharge in vaginal canal with scant amount of brown discharge. Extreme cervical motion tenderness. Generalized tenderness to palpation of lower abdomen during bimanual exam.   Musculoskeletal: Normal range of motion.  Neurological: She is alert and oriented to person, place, and time.  Skin: Skin is warm and dry. She is not diaphoretic.  Psychiatric: She has a normal mood and affect. Her behavior is normal.    ED Course  Procedures (including critical care time)  Labs Reviewed  URINALYSIS, ROUTINE W REFLEX MICROSCOPIC - Abnormal; Notable for the following:    APPearance CLOUDY (*)     Hgb urine dipstick MODERATE (*)     Protein, ur 30 (*)     Leukocytes, UA TRACE (*)     All other components within normal limits  URINE MICROSCOPIC-ADD ON - Abnormal; Notable for the following:    Squamous Epithelial / LPF FEW (*)     Bacteria, UA FEW (*)     All other components within normal limits  CBC WITH DIFFERENTIAL - Abnormal; Notable for the following:    RBC 3.56 (*)     Hemoglobin 11.4 (*)     HCT 33.0 (*)     Neutrophils Relative 36 (*)     Lymphocytes Relative 52 (*)     All other components within normal limits  WET PREP, GENITAL - Abnormal; Notable for the following:    Clue Cells Wet Prep HPF POC FEW (*)      WBC, Wet Prep HPF POC FEW (*)     All other components within normal limits  POCT PREGNANCY, URINE  BASIC METABOLIC PANEL  GC/CHLAMYDIA PROBE AMP, GENITAL   No results found.   1. UTI (urinary tract infection)   2. BV (bacterial vaginosis)   3. PID (acute pelvic inflammatory disease)       MDM  12:57 PM Patient's urinalysis shows UTI. Pelvic performed showing notable discharge. Patient experienced significant pain during pelvic exam, making PID a likely diagnosis.   2:50 PM Patient does not have a fever or elevated WBC at this time; suspicion for appendicitis is low. Patient will be treated  for UTI, BV, and PID. I will send her home with Metronidazole, Doxycycline, and pain control. I will give her a dilaudid before she leaves. No further evaluation needed at this time. Paln discussed with Dr. Lynelle Doctor who is agreeable.      Emilia Beck, PA-C 05/28/12 1455

## 2012-05-28 NOTE — ED Notes (Signed)
Pt presents with no acute dsitress.  Pt DX here at [facility]- with PID- has GYN appt. 06/04/12 is unable to tolerate pain til then.  Pt attempted to get sooner appt.  Here for reevaluation- Pt reports fever at times denies N/V/D

## 2012-05-29 LAB — GC/CHLAMYDIA PROBE AMP, GENITAL
Chlamydia, DNA Probe: NEGATIVE
GC Probe Amp, Genital: NEGATIVE

## 2012-06-12 ENCOUNTER — Emergency Department (HOSPITAL_COMMUNITY): Payer: Managed Care, Other (non HMO)

## 2012-06-12 ENCOUNTER — Emergency Department (HOSPITAL_COMMUNITY)
Admission: EM | Admit: 2012-06-12 | Discharge: 2012-06-12 | Disposition: A | Discharge status: Home / Self Care | Payer: Managed Care, Other (non HMO) | Attending: Emergency Medicine | Admitting: Emergency Medicine

## 2012-06-12 DIAGNOSIS — F17200 Nicotine dependence, unspecified, uncomplicated: Secondary | ICD-10-CM | POA: Insufficient documentation

## 2012-06-12 DIAGNOSIS — F319 Bipolar disorder, unspecified: Secondary | ICD-10-CM | POA: Insufficient documentation

## 2012-06-12 DIAGNOSIS — Z87442 Personal history of urinary calculi: Secondary | ICD-10-CM | POA: Insufficient documentation

## 2012-06-12 DIAGNOSIS — Z881 Allergy status to other antibiotic agents status: Secondary | ICD-10-CM | POA: Insufficient documentation

## 2012-06-12 DIAGNOSIS — R109 Unspecified abdominal pain: Secondary | ICD-10-CM

## 2012-06-12 MED ORDER — FENTANYL CITRATE 0.05 MG/ML IJ SOLN
50.0000 ug | Freq: Once | INTRAMUSCULAR | Status: AC
  Administered 2012-06-12: 50 ug via INTRAVENOUS
  Filled 2012-06-12: qty 2

## 2012-06-12 MED ORDER — ONDANSETRON HCL 4 MG PO TABS: 4.0000 mg | ORAL_TABLET | Freq: Four times a day (QID) | ORAL | Status: DC

## 2012-06-12 MED ORDER — DICYCLOMINE HCL 20 MG PO TABS: 20.0000 mg | ORAL_TABLET | Freq: Two times a day (BID) | ORAL | Status: DC

## 2012-06-12 MED ORDER — HYDROMORPHONE HCL PF 2 MG/ML IJ SOLN
2.0000 mg | Freq: Once | INTRAMUSCULAR | Status: AC
  Administered 2012-06-12: 2 mg via INTRAVENOUS
  Filled 2012-06-12: qty 1

## 2012-06-12 NOTE — ED Provider Notes (Signed)
History     CSN: [id number]  Arrival date & time 06/12/12  4098   First MD Initiated Contact with Patient 06/12/12 [phone]      Chief Complaint  Patient presents with  . Abdominal Pain    (Consider location/radiation/quality/duration/timing/severity/associated sxs/prior treatment) HPI  Patient has many visits to the ER for abdominal pain and just recently had a scan done. She presents to the ER with vague complaints of RLQ and LLQ abdominal pains. She doesn't want to answer any of my questions but informs me that Toradol and Morphine don't work. She says that she needs Dilaudid because that's what works. She says she takes Percocets at home and she "guesses they help". She sees a GI doctor and gynecologist who are treating some chronic issues of hers including IBS, which she informs me, "is not your normal IBS".  She is in NAD and VSS.  Past Medical History  Diagnosis Date  . IBS (irritable bowel syndrome)   . Bipolar 1 disorder   . Mononucleosis   . Kidney stones   . PID (acute pelvic inflammatory disease) 04/28/2012  . Chlamydia 04/29/2012  . C. difficile diarrhea     Past Surgical History  Procedure Date  . Kidney stone surgery   . Tonsillectomy   . Inner ear surgery   . Wisdom tooth extraction     No family history on file.  History  Substance Use Topics  . Smoking status: Current Every Day Smoker -- 0.5 packs/day for 10 years  . Smokeless tobacco: Not on file  . Alcohol Use: No    OB History    Grav Para Term Preterm Abortions TAB SAB Ect Mult Living   0               Review of Systems  Review of Systems  Gen: no weight loss, fevers, chills, night sweats  Eyes: no discharge or drainage, no occular pain or visual changes  Nose: no epistaxis or rhinorrhea  Mouth: no dental pain, no sore throat  Neck: no neck pain  Lungs:No wheezing, coughing or hemoptysis CV: no chest pain, palpitations, dependent edema or orthopnea  Abd: + abdominal pain, no nausea,  vomiting  GU: no dysuria or gross hematuria  MSK:  No abnormalities  Neuro: no headache, no focal neurologic deficits  Skin: no abnormalities Psyche: negative.   Allergies  Azithromycin; Prednisone; Prednisone; and Azithromycin  Home Medications   Current Outpatient Rx  Name Route Sig Dispense Refill  . CLONAZEPAM 1 MG PO TABS Oral Take 2 mg by mouth at bedtime.      Marland Kitchen DICYCLOMINE HCL 10 MG PO CAPS Oral Take 10 mg by mouth 4 (four) times daily -  before meals and at bedtime.    . IBUPROFEN 200 MG PO TABS Oral Take 600 mg by mouth every 6 (six) hours as needed. For pain    . NORGESTIMATE-ETH ESTRADIOL 0.25-35 MG-MCG PO TABS Oral Take 1 tablet by mouth daily.      Marland Kitchen OMEPRAZOLE 40 MG PO CPDR Oral Take 40 mg by mouth every morning.     Marland Kitchen PROMETHAZINE HCL 25 MG PO TABS Oral Take 25 mg by mouth at bedtime as needed. For nausea    . ZOLPIDEM TARTRATE 10 MG PO TABS Oral Take 10 mg by mouth at bedtime as needed. For sleep    . DICYCLOMINE HCL 20 MG PO TABS Oral Take 1 tablet (20 mg total) by mouth 2 (two) times daily. 20 tablet 0  .  ONDANSETRON HCL 4 MG PO TABS Oral Take 1 tablet (4 mg total) by mouth every 6 (six) hours. 12 tablet 0    BP 111/58  Pulse 84  Temp 98.2 F (36.8 C) (Oral)  Resp 24  Ht 5\' 4"  (1.626 m)  SpO2 100%  LMP 05/22/2012  Physical Exam  Nursing note and vitals reviewed. Constitutional: She appears well-developed and well-nourished. No distress.  HENT:  Head: Normocephalic and atraumatic.  Eyes: Pupils are equal, round, and reactive to light.  Neck: Normal range of motion. Neck supple.  Cardiovascular: Normal rate and regular rhythm.   Pulmonary/Chest: Effort normal.  Abdominal: Soft. She exhibits no distension and no mass. There is tenderness (RLQ and LLQ). There is no rebound and no guarding.  Neurological: She is alert.  Skin: Skin is warm and dry.    ED Course  Procedures (including critical care time)   Labs Reviewed  URINALYSIS, ROUTINE W REFLEX  MICROSCOPIC  PREGNANCY, URINE  CBC WITH DIFFERENTIAL  BASIC METABOLIC PANEL   No results found.   1. Abdominal pain       MDM  I suspect patient of being drug seeking. She simply says she will be happy with pain meds and then leaving. I had Dr. Dierdre Highman go and see her. We both feel that she does not need work-up at this time and can see her PCP doctor or GI doctor today.  Pain managed in the ER. Pt discharged with Bentyl and Zofran.  Pt has been advised of the symptoms that warrant their return to the ED. Patient has voiced understanding and has agreed to follow-up with the PCP or specialist.         Dorthula Matas, PA 06/12/12 [phone]

## 2012-06-12 NOTE — ED Notes (Signed)
Pt agreed to disgust medical information in the company of friends.

## 2012-06-12 NOTE — ED Notes (Signed)
Visitor at bedside states he found pt laying in her bedroom floor shaking all over and c/o abd pain

## 2012-06-12 NOTE — ED Notes (Signed)
[place], PA in to see pt  

## 2012-06-12 NOTE — ED Notes (Signed)
Pt c/o abd pain the patient was seen at [facility] pervious and Dx with PIV- pt has appointment next week at [facility] hospital. VSS. Pain 10/10. No N/V

## 2012-06-12 NOTE — ED Notes (Signed)
When assessing pt prior to discharge pt states pain at a 6.5 to 7  Pt unable to carry on a conversation due to sedation  Speech slurred  Discussed prescriptions with pt and she states she does not want either of them  Informed pt that she would be given scripts anyway

## 2012-06-13 NOTE — ED Provider Notes (Signed)
Medical screening examination/treatment/procedure(s) were performed by non-physician practitioner and as supervising physician I was immediately available for consultation/collaboration.  Fredia Chittenden, MD 06/13/12 0025 

## 2012-06-18 ENCOUNTER — Emergency Department (HOSPITAL_COMMUNITY): Payer: Managed Care, Other (non HMO)

## 2012-06-18 ENCOUNTER — Emergency Department (HOSPITAL_COMMUNITY)
Admission: EM | Admit: 2012-06-18 | Discharge: 2012-06-18 | Disposition: A | Discharge status: Home / Self Care | Payer: Managed Care, Other (non HMO) | Attending: Emergency Medicine | Admitting: Emergency Medicine

## 2012-06-18 ENCOUNTER — Encounter (HOSPITAL_COMMUNITY): Payer: Self-pay | Admitting: *Deleted

## 2012-06-18 DIAGNOSIS — R109 Unspecified abdominal pain: Secondary | ICD-10-CM | POA: Insufficient documentation

## 2012-06-18 LAB — CBC WITH DIFFERENTIAL/PLATELET
Basophils Relative: 1 % (ref 0–1)
Eosinophils Relative: 4 % (ref 0–5)
HCT: 37.4 % (ref 36.0–46.0)
Hemoglobin: 12.5 g/dL (ref 12.0–15.0)
Lymphs Abs: 3.8 10*3/uL (ref 0.7–4.0)
MCH: 31.3 pg (ref 26.0–34.0)
MCV: 93.7 fL (ref 78.0–100.0)
Monocytes Absolute: 0.4 10*3/uL (ref 0.1–1.0)
Monocytes Relative: 6 % (ref 3–12)
Neutro Abs: 2.1 10*3/uL (ref 1.7–7.7)
RBC: 3.99 MIL/uL (ref 3.87–5.11)
WBC: 6.7 10*3/uL (ref 4.0–10.5)

## 2012-06-18 LAB — COMPREHENSIVE METABOLIC PANEL
CO2: 23 mEq/L (ref 19–32)
Calcium: 9 mg/dL (ref 8.4–10.5)
Chloride: 98 mEq/L (ref 96–112)
Creatinine, Ser: 0.56 mg/dL (ref 0.50–1.10)
GFR calc Af Amer: >90 mL/min (ref >90)
GFR calc non Af Amer: >90 mL/min (ref >90)
Glucose, Bld: 79 mg/dL (ref 70–99)
Total Bilirubin: 0.3 mg/dL (ref 0.3–1.2)

## 2012-06-18 LAB — URINALYSIS, ROUTINE W REFLEX MICROSCOPIC
Bilirubin Urine: NEGATIVE
Nitrite: NEGATIVE
Protein, ur: 30 mg/dL — AB
Specific Gravity, Urine: 1.016 (ref 1.005–1.030)
Urobilinogen, UA: 0.2 mg/dL (ref 0.0–1.0)

## 2012-06-18 LAB — LIPASE, BLOOD: Lipase: 39 U/L (ref 11–59)

## 2012-06-18 LAB — URINE MICROSCOPIC-ADD ON

## 2012-06-18 LAB — PREGNANCY, URINE: Preg Test, Ur: NEGATIVE

## 2012-06-18 MED ORDER — HYDROMORPHONE HCL PF 1 MG/ML IJ SOLN
1.0000 mg | Freq: Once | INTRAMUSCULAR | Status: AC
  Administered 2012-06-18: 1 mg via INTRAVENOUS
  Filled 2012-06-18: qty 1

## 2012-06-18 MED ORDER — MORPHINE SULFATE 4 MG/ML IJ SOLN
4.0000 mg | Freq: Once | INTRAMUSCULAR | Status: AC
  Administered 2012-06-18: 4 mg via INTRAVENOUS
  Filled 2012-06-18: qty 1

## 2012-06-18 MED ORDER — ONDANSETRON HCL 4 MG/2ML IJ SOLN
4.0000 mg | Freq: Once | INTRAMUSCULAR | Status: AC
  Administered 2012-06-18: 4 mg via INTRAVENOUS
  Filled 2012-06-18: qty 2

## 2012-06-18 MED ORDER — CEPHALEXIN 500 MG PO CAPS: 500.0000 mg | ORAL_CAPSULE | Freq: Four times a day (QID) | ORAL | Status: DC

## 2012-06-18 MED ORDER — SODIUM CHLORIDE 0.9 % IV SOLN
Freq: Once | INTRAVENOUS | Status: AC
  Administered 2012-06-18: 05:00:00 via INTRAVENOUS

## 2012-06-18 MED ORDER — SODIUM CHLORIDE 0.9 % IV SOLN
1000.0000 mL | Freq: Once | INTRAVENOUS | Status: AC
  Administered 2012-06-18: 1000 mL via INTRAVENOUS

## 2012-06-18 MED ORDER — PHENAZOPYRIDINE HCL 200 MG PO TABS: 200.0000 mg | ORAL_TABLET | Freq: Three times a day (TID) | ORAL | Status: DC

## 2012-06-18 MED ORDER — PHENAZOPYRIDINE HCL 200 MG PO TABS
200.0000 mg | ORAL_TABLET | Freq: Three times a day (TID) | ORAL | Status: DC
  Filled 2012-06-18: qty 1

## 2012-06-18 MED ORDER — CEPHALEXIN 500 MG PO CAPS
500.0000 mg | ORAL_CAPSULE | Freq: Once | ORAL | Status: DC
  Filled 2012-06-18: qty 1

## 2012-06-18 MED ORDER — SODIUM CHLORIDE 0.9 % IV SOLN
1000.0000 mL | INTRAVENOUS | Status: DC
  Administered 2012-06-18: 1000 mL via INTRAVENOUS

## 2012-06-18 MED ORDER — ACETAMINOPHEN 500 MG PO TABS: 500.0000 mg | ORAL_TABLET | Freq: Four times a day (QID) | ORAL | Status: DC | PRN

## 2012-06-18 NOTE — ED Notes (Signed)
Pt crying hysterically, states "I don't want any of your medicine! I can't believe that the doctor won't give me any more pain medicine, I am pissing straight blood!" when medications were explained, pt replied, in a loud voice " I don't want those. I have pyridium already, I want to file a complaint about that doctor!" Pt with rambling thought process regarding " I got fired from my primary doctor because some ghetto nurse, on my birthday, was rude to me, and I cussed her out, and now I don't have a regular doctor. I will call my urologist today to see if I can see him, normally the doctor's give me pain medicine to go, this doctor said that he would give me something to last me until the 11th when I have an appt."  Explained to pt that antibiotics and pyridium were the treatment of choice for dx, pt yelled, "I don't want those! I don't want anything from you, I just want to leave." Pt refused to take discharge instructions with prescriptions, left ambulatory with boyfriend. Refused to sign AMA form.

## 2012-06-18 NOTE — ED Notes (Signed)
Pt c/o lower right abd pain x 2 days; states feels like she passed a kidney stone a few days ago

## 2012-06-18 NOTE — ED Provider Notes (Signed)
History     CSN: [id number]  Arrival date & time 06/18/12  9147   First MD Initiated Contact with Patient 06/18/12 [phone]      Chief Complaint  Patient presents with  . Abdominal Pain     The history is provided by the patient.   the patient reports severe radiating right lower abdominal pain x2 days.  She feels like this is similar to prior ureteral stones.  She reports her pain is moderate to severe in severity at this time.  She reports nausea and vomiting.  She denies diarrhea.  She has no hematemesis.  She denies melena.  She's denies fevers or chills.  She reports urinary urgency and dysuria.  She reports hematuria as well.  Past Medical History  Diagnosis Date  . IBS (irritable bowel syndrome)   . Bipolar 1 disorder   . Mononucleosis   . Kidney stones   . PID (acute pelvic inflammatory disease) 04/28/2012  . Chlamydia 04/29/2012  . C. difficile diarrhea     Past Surgical History  Procedure Date  . Kidney stone surgery   . Tonsillectomy   . Inner ear surgery   . Wisdom tooth extraction     No family history on file.  History  Substance Use Topics  . Smoking status: Current Every Day Smoker -- 0.5 packs/day for 10 years  . Smokeless tobacco: Not on file  . Alcohol Use: No    OB History    Grav Para Term Preterm Abortions TAB SAB Ect Mult Living   0               Review of Systems  Gastrointestinal: Positive for abdominal pain.  All other systems reviewed and are negative.    Allergies  Azithromycin; Prednisone; Prednisone; and Azithromycin  Home Medications   Current Outpatient Rx  Name Route Sig Dispense Refill  . CLONAZEPAM 1 MG PO TABS Oral Take 2 mg by mouth at bedtime.      . IBUPROFEN 200 MG PO TABS Oral Take 600 mg by mouth every 6 (six) hours as needed. For pain    . NORGESTIMATE-ETH ESTRADIOL 0.25-35 MG-MCG PO TABS Oral Take 1 tablet by mouth daily.      Marland Kitchen PROMETHAZINE HCL 25 MG PO TABS Oral Take 25 mg by mouth at bedtime as needed. For  nausea    . DICYCLOMINE HCL 20 MG PO TABS Oral Take 1 tablet (20 mg total) by mouth 2 (two) times daily. 20 tablet 0  . ZOLPIDEM TARTRATE 10 MG PO TABS Oral Take 10 mg by mouth at bedtime as needed. For sleep      BP 113/70  Pulse 95  Temp 97.8 F (36.6 C)  Resp 20  SpO2 100%  LMP 06/04/2012  Physical Exam  Nursing note and vitals reviewed. Constitutional: She is oriented to person, place, and time. She appears well-developed and well-nourished. No distress.       Tearful  HENT:  Head: Normocephalic and atraumatic.  Eyes: EOM are normal.  Neck: Normal range of motion.  Cardiovascular: Normal rate, regular rhythm and normal heart sounds.   Pulmonary/Chest: Effort normal and breath sounds normal.  Abdominal: Soft. She exhibits no distension. There is no tenderness.  Genitourinary:       No CVA tenderness  Musculoskeletal: Normal range of motion.  Neurological: She is alert and oriented to person, place, and time.  Skin: Skin is warm and dry.  Psychiatric: She has a normal mood and affect. Judgment  normal.    ED Course  Procedures (including critical care time)  Labs Reviewed  CBC WITH DIFFERENTIAL - Abnormal; Notable for the following:    Neutrophils Relative 31 (*)     Lymphocytes Relative 58 (*)     All other components within normal limits  COMPREHENSIVE METABOLIC PANEL - Abnormal; Notable for the following:    Sodium 133 (*)     BUN 4 (*)     All other components within normal limits  URINALYSIS, ROUTINE W REFLEX MICROSCOPIC - Abnormal; Notable for the following:    APPearance CLOUDY (*)     Hgb urine dipstick LARGE (*)     Protein, ur 30 (*)     Leukocytes, UA SMALL (*)     All other components within normal limits  URINE MICROSCOPIC-ADD ON - Abnormal; Notable for the following:    Squamous Epithelial / LPF MANY (*)     Bacteria, UA MANY (*)     All other components within normal limits  PREGNANCY, URINE  LIPASE, BLOOD   US Renal  06/18/2012  *RADIOLOGY  REPORT*  Clinical Data: History of ureteral stones.  Right-sided flank pain and hematuria.  RENAL/URINARY TRACT ULTRASOUND COMPLETE  Comparison:  Stone study of 05/18/2012  Findings:  Right Kidney:  9.7 cm. No hydronephrosis.  Left Kidney:  10.0 cm. No hydronephrosis.  Bladder:  Within normal limits.  Bilateral ureteric jets identified.  IMPRESSION: Normal renal ultrasound.  No evidence of urinary tract calculi or explanation for right-sided pain.   Original Report Authenticated By: Consuello Bossier, M.D.      1. Abdominal pain       MDM  Urine appears be contaminated specimen with many epithelial cells.  There is hematuria present.  Urine culture sent.  This is the patient's 16th visit to the emergency department in the past year.  It seems as though she frequents the emergency department for complaints of pain including severe abdominal pain.  She reports she's had a ureteral stone extracted by one the urologist there is no documentation of this.  She was recently dismissed from her primary care physician's office Dr. Drue Novel, and it's unclear why this was.  Has some concerns regarding narcotic use/abuse.  The patient's pain will be treated in the emergency department and a renal ultrasound obtained to evaluate for the possibility of hydronephrosis.  7:40 AM The patient continues to complain of pain at this time.  I told her I would be unable to prescribe her narcotic medications.  Home with Keflex and Pyridium.  I told her she can take ibuprofen and Tylenol for her pain.  Her friend who is present in the room was seen in the ER this evening as well for abdominal pain and a hx of chronic pancreatitis.  Her friend is 30 years old       Lyanne Co, MD 06/18/12 [phone]

## 2012-06-18 NOTE — ED Notes (Signed)
US staff at bedside.  

## 2012-07-20 ENCOUNTER — Ambulatory Visit (INDEPENDENT_AMBULATORY_CARE_PROVIDER_SITE_OTHER): Payer: Managed Care, Other (non HMO) | Admitting: Internal Medicine

## 2012-07-20 ENCOUNTER — Ambulatory Visit: Payer: Managed Care, Other (non HMO)

## 2012-07-20 VITALS — BP 100/68 | HR 73 | Temp 97.6°F | Resp 18 | Ht 64.0 in | Wt 106.2 lb

## 2012-07-20 DIAGNOSIS — R05 Cough: Secondary | ICD-10-CM

## 2012-07-20 DIAGNOSIS — R059 Cough, unspecified: Secondary | ICD-10-CM

## 2012-07-20 DIAGNOSIS — J019 Acute sinusitis, unspecified: Secondary | ICD-10-CM

## 2012-07-20 MED ORDER — HYDROCODONE-HOMATROPINE 5-1.5 MG/5ML PO SYRP: 5.0000 mL | ORAL_SOLUTION | Freq: Four times a day (QID) | ORAL | Status: DC | PRN

## 2012-07-20 MED ORDER — PROMETHAZINE HCL 25 MG PO TABS: 25.0000 mg | ORAL_TABLET | Freq: Three times a day (TID) | ORAL | Status: DC | PRN

## 2012-07-20 MED ORDER — AMOXICILLIN-POT CLAVULANATE 875-125 MG PO TABS: 1.0000 | ORAL_TABLET | Freq: Two times a day (BID) | ORAL | Status: DC

## 2012-07-20 NOTE — Patient Instructions (Addendum)
Let your dentist know that we have treated your sinus infection and at this point expect you to be well within the next 5-7 days and able to have a cleaning procedure

## 2012-07-20 NOTE — Progress Notes (Signed)
  Subjective:    Patient ID: Tonya Davidson, female    DOB: 1990/11/04, 21 y.o.   MRN: [id number]  CC: 21 yo W F c/o sore throat and URI symptoms  HPI Pt c/o 2.5 wk hx of URI symptoms: Sick contacts, fevers, sore throat, tender ln's, wet cough in am to dry cough in pm that keeps her up at night, sore chest, nausea and constipation.  A few days into the initial symptoms she was seen at [facility] and  Started on Doxycycline and promethazine.  After finishing the antibiotic she does not feel any better and continues to have symptoms.   Additionally she says she went to get a dental cleaning and her dentist said he could not clean her teeth bc of her sinus symptoms and prescribed her amoxicillin.  This did not help her either.  She says the only thing that is going to make her better is "hydromet syrup."    PMHx: Patient Active Problem List  Diagnosis  . PID (acute pelvic inflammatory disease)  . Chlamydia  . Clostridium difficile diarrhea  . ANXIETY  . IBS  . WEIGHT LOSS  . HEADACHE  . RLQ abdominal pain    -  Bipolar/mood disorder   SocialHx: -(+) tobacco use 1/2 ppd /Others as noted in chart  Review of Systems Noncontributory /No recent dramatic changes in psychiatric symptoms    Objective:   Physical Exam General: 21 yo F with hoarse voice is cooperative with exam. Vitals:  Filed Vitals:   07/20/12 1526  BP: 100/68  Pulse: 73  Temp: 97.6 F (36.4 C)  Resp: 18  HEENT: Nontraumatic, EOMIT, TM's clear w/o exudates, Nasal turbinates clear/non-swollen, pharynx pink, nonswollen and w/o exudates, Neck = supple with LN's Heart: RRR/No murmur Lungs: CTA bilaterally/No wheezes MSK: Normal bulk and tone Neuro: Alert, oriented, CN II - XII grossly IT     Assessment & Plan:  1) Sinusitis - Amoxicillin-clavulanate  875-125mg  bid 10 days                    - Hydrocodone-homatropine 5-1.5mg /ml syrup NR                    - Promethazine 25 mg #20 NR She is invited to  participate in Comprehensive Care in our clinic as this seems to be lacking in her overall treatment

## 2012-07-30 ENCOUNTER — Ambulatory Visit (INDEPENDENT_AMBULATORY_CARE_PROVIDER_SITE_OTHER): Payer: Managed Care, Other (non HMO) | Admitting: Family Medicine

## 2012-07-30 VITALS — BP 109/76 | HR 83 | Temp 97.8°F | Resp 16

## 2012-07-30 DIAGNOSIS — R6889 Other general symptoms and signs: Secondary | ICD-10-CM

## 2012-07-30 DIAGNOSIS — R110 Nausea: Secondary | ICD-10-CM

## 2012-07-30 DIAGNOSIS — J111 Influenza due to unidentified influenza virus with other respiratory manifestations: Secondary | ICD-10-CM

## 2012-07-30 DIAGNOSIS — J069 Acute upper respiratory infection, unspecified: Secondary | ICD-10-CM

## 2012-07-30 DIAGNOSIS — R059 Cough, unspecified: Secondary | ICD-10-CM

## 2012-07-30 DIAGNOSIS — R05 Cough: Secondary | ICD-10-CM

## 2012-07-30 LAB — POCT CBC
Granulocyte percent: 68.7 % (ref 37–80)
HCT, POC: 42.9 % (ref 37.7–47.9)
Hemoglobin: 13 g/dL (ref 12.2–16.2)
Lymph, poc: 1.8 (ref 0.6–3.4)
MCH, POC: 29.6 pg (ref 27–31.2)
MCHC: 30.3 g/dL — AB (ref 31.8–35.4)
MCV: 97.7 fL — AB (ref 80–97)
MID (cbc): 0.5 (ref 0–0.9)
MPV: 9.8 fL (ref 0–99.8)
POC Granulocyte: 5 (ref 2–6.9)
POC LYMPH PERCENT: 25.1 % (ref 10–50)
POC MID %: 6.2 % (ref 0–12)
Platelet Count, POC: 345 K/uL (ref 142–424)
RBC: 4.39 M/uL (ref 4.04–5.48)
RDW, POC: 12.6 %
WBC: 7.3 K/uL (ref 4.6–10.2)

## 2012-07-30 MED ORDER — BENZONATATE 100 MG PO CAPS: 200.0000 mg | ORAL_CAPSULE | Freq: Two times a day (BID) | ORAL | Status: AC | PRN

## 2012-07-30 MED ORDER — ONDANSETRON HCL 4 MG PO TABS: 4.0000 mg | ORAL_TABLET | Freq: Three times a day (TID) | ORAL | Status: DC | PRN

## 2012-07-30 MED ORDER — HYDROCODONE-HOMATROPINE 5-1.5 MG/5ML PO SYRP: 5.0000 mL | ORAL_SOLUTION | Freq: Every evening | ORAL | Status: DC | PRN

## 2012-07-30 NOTE — Progress Notes (Signed)
[facility]:  Office Visit  Chief Complaint:  Chief Complaint  Patient presents with  . Generalized Body Aches  . Fever  . Nausea  . Emesis  . Nasal Congestion  . Loss of Consciousness    patient has fainted four times since 2 weeks ago    HPI: Tonya Davidson is a 21 y.o. female who is here for a recheck. She was seen by Dr. Merla Riches on 11/2 and diagnosed with acute sinusitis. She returns 10 days after his visit and  Also about 4 weeks from when she was seen at [facility]. She complains of similar symptoms.  Running a fever Tmax 100.3 before she came in but took tylenol so it is currently down , nausea, diffuse and chronic  intermittent RLQ stomach pains, a lot of mucus build up.   Getting hot and cold chills through out day. Have a "feeling in chest, like something stuck, like you have to burp but can't" due to mucus, has had intermittent HA. No throat pain, was seen at [facility] 4 weeks ago and was put on Doxycycline ( she finished 5/7 days) and then saw Dr. Merla Riches who put her on Amoxacillin and Hydromet syrup. Hydromet syrup works well at night time and she states is the only thing that works for her.She also has had msk aches and took her boyfriend's vicodin x 1 day which helped a lot. Tried rest, soups and fluids without relief.  She feels " like absolute dog crap" . She has been out of work for 2 weeks, works under the table at [employer]. She states that "The only thing that works is the hyrdromet syrup" to help her cough and sleep at night. She almost fainted twice and felt weak after coughing up mucus and throwing up. Patient has a h/o fibromyalgia and anxiety on Klonopin and IBS, she also has a h/o C. Diff during hospitalization for PID.  Past Medical History  Diagnosis Date  . IBS (irritable bowel syndrome)   . Bipolar 1 disorder   . Mononucleosis   . Kidney stones   . PID (acute pelvic inflammatory disease) 04/28/2012  . Chlamydia  04/29/2012  . C. difficile diarrhea   . Anemia   . Anxiety   . Arthritis   . Depression   . Ulcer    Past Surgical History  Procedure Date  . Kidney stone surgery   . Tonsillectomy   . Inner ear surgery   . Wisdom tooth extraction    History   Social History  . Marital Status: Single    Spouse Name: N/A    Number of Children: N/A  . Years of Education: N/A   Social History Main Topics  . Smoking status: Current Every Day Smoker -- 0.5 packs/day for 10 years  . Smokeless tobacco: None  . Alcohol Use: No  . Drug Use: No  . Sexually Active: Yes    Birth Control/ Protection: Pill   Other Topics Concern  . None   Social History Narrative   ** Merged History Encounter **    Family History  Problem Relation Age of Onset  . Bipolar disorder Mother   . Cancer Father   . ADD / ADHD Brother   . Cancer Maternal Grandmother   . Cancer Maternal Grandfather   . Cancer Paternal Grandmother   . Cancer Paternal Grandfather    Allergies  Allergen Reactions  . Azithromycin Other (See Comments)    Reaction:GI upset  .  Prednisone     Impacts Bipolar symptoms  . Prednisone Other (See Comments)    Bi-polar,mood swings  . Azithromycin     REACTION: abd pain   Prior to Admission medications   Medication Sig Start Date End Date Taking? Authorizing Provider  acetaminophen (TYLENOL) 500 MG tablet Take 1 tablet (500 mg total) by mouth every 6 (six) hours as needed for pain. 06/18/12  Yes Lyanne Co, MD  amoxicillin-clavulanate (AUGMENTIN) 875-125 MG per tablet Take 1 tablet by mouth 2 (two) times daily. 07/20/12  Yes Tonye Pearson, MD  clonazePAM (KLONOPIN) 1 MG tablet Take 2 mg by mouth at bedtime.     Yes Historical Provider, MD  dicyclomine (BENTYL) 20 MG tablet Take 1 tablet (20 mg total) by mouth 2 (two) times daily. 06/12/12  Yes Tiffany Irine Seal, PA  ibuprofen (ADVIL,MOTRIN) 200 MG tablet Take 600 mg by mouth every 6 (six) hours as needed. For pain   Yes Historical  Provider, MD  norgestimate-ethinyl estradiol (ORTHO-CYCLEN,SPRINTEC,PREVIFEM) 0.25-35 MG-MCG tablet Take 1 tablet by mouth daily.     Yes Historical Provider, MD  cephALEXin (KEFLEX) 500 MG capsule Take 1 capsule (500 mg total) by mouth 4 (four) times daily. 06/18/12   Lyanne Co, MD  HYDROcodone-homatropine [facility]) 5-1.5 MG/5ML syrup Take 5 mLs by mouth every 6 (six) hours as needed for cough. 07/20/12 07/30/12  Tonye Pearson, MD  phenazopyridine (PYRIDIUM) 200 MG tablet Take 1 tablet (200 mg total) by mouth 3 (three) times daily. 06/18/12   Lyanne Co, MD  promethazine (PHENERGAN) 25 MG tablet Take 1 tablet (25 mg total) by mouth every 8 (eight) hours as needed for nausea. 07/20/12   Tonye Pearson, MD  zolpidem (AMBIEN) 10 MG tablet Take 10 mg by mouth at bedtime as needed. For sleep    Historical Provider, MD     ROS: The patient denies night sweats, unintentional weight loss, chest pain, palpitations, wheezing, dyspnea on exertion, dysuria, hematuria, melena, numbness,  or tingling.  All other systems have been reviewed and were otherwise negative with the exception of those mentioned in the HPI and as above.    PHYSICAL EXAM: Filed Vitals:   07/30/12 1629  BP: 109/76  Pulse: 83  Temp: 97.8 F (36.6 C)  Resp: 16   There were no vitals filed for this visit. There is no height or weight on file to calculate BMI.  General: Alert, no acute distress, very talkative, borderline argumentative HEENT:  Normocephalic, atraumatic, oropharynx patent. Tm nl. No exudates. + mucus drainage. No sinus tenderness Cardiovascular:  Regular rate and rhythm, no rubs murmurs or gallops.  No Carotid bruits, radial pulse intact. No pedal edema.  Respiratory: Clear to auscultation bilaterally.  No wheezes, rales, or rhonchi.  No cyanosis, no use of accessory musculature GI: No organomegaly, abdomen is soft and non-tender, positive bowel sounds.  No masses. Skin: No rashes. + multiple  tattoos Neurologic: Facial musculature symmetric. Psychiatric: Patient is appropriate throughout our interaction. Lymphatic: No cervical lymphadenopathy Musculoskeletal: Gait intact.   LABS: Results for orders placed in visit on 07/30/12  POCT CBC      Component Value Range   WBC 7.3  4.6 - 10.2 K/uL   Lymph, poc 1.8  0.6 - 3.4   POC LYMPH PERCENT 25.1  10 - 50 %L   MID (cbc) 0.5  0 - 0.9   POC MID % 6.2  0 - 12 %M   POC Granulocyte 5.0  2 -  6.9   Granulocyte percent 68.7  37 - 80 %G   RBC 4.39  4.04 - 5.48 M/uL   Hemoglobin 13.0  12.2 - 16.2 g/dL   HCT, POC 47.8  29.5 - 47.9 %   MCV 97.7 (*) 80 - 97 fL   MCH, POC 29.6  27 - 31.2 pg   MCHC 30.3 (*) 31.8 - 35.4 g/dL   RDW, POC 62.1     Platelet Count, POC 345  142 - 424 K/uL   MPV 9.8  0 - 99.8 fL     EKG/XRAY:   Primary read interpreted by Dr. Conley Rolls at [facility].   ASSESSMENT/PLAN: Encounter Diagnoses  Name Primary?  . Flu-like symptoms Yes  . Cough   . Viral upper respiratory illness   . Nausea    Most likely viral URI, needs to run its course. Patient thinks that the only thing that will help is the hydromet syrup. Narcotic profile pulled, she is taking alot Clonazepam but from same providerss. I  have rx:  Tessalon perles for daytime, Hydromet syrup one teaspoon at night time, 180 ml x 1 with no refills No more refills on Hydromet, if she continues to have mucus drainage that is causing her to cough I recommend taking mucinex, antihistamine and let virus run its course and if no improvement then need appt with her ENT. She does not need to have any more narcotic medcations on board , there is an increase risk for addictiona dn the narcotics may be the reason she feels nauseated. I am somewhat suspicious of her adamant request for Hydromet syrup and also for promethazine.  Gave Zofran, no promethazine ( she asked for promethazine it but I told her that she will get promethazine rectally if she is that nauseated since that is  how I would prescribe it and she declined the rectal promethazine and is ok with zofran)  No abx needed, already has had 2 rounds of abx in last 4 weeks and no improvement but not worse, history of C. Difficile with last hospitalization for PID. Currently does not have diarrhea. .  F/u prn    Darsi Tien PHUONG, DO 07/30/2012 6:15 PM

## 2012-08-11 ENCOUNTER — Emergency Department (HOSPITAL_COMMUNITY)
Admission: EM | Admit: 2012-08-11 | Discharge: 2012-08-12 | Discharge status: Against Medical Advice | Payer: Managed Care, Other (non HMO) | Attending: Emergency Medicine | Admitting: Emergency Medicine

## 2012-08-11 ENCOUNTER — Encounter (HOSPITAL_COMMUNITY): Payer: Self-pay | Admitting: Emergency Medicine

## 2012-08-11 DIAGNOSIS — Z8659 Personal history of other mental and behavioral disorders: Secondary | ICD-10-CM | POA: Insufficient documentation

## 2012-08-11 DIAGNOSIS — R10A0 Flank pain, unspecified side: Secondary | ICD-10-CM

## 2012-08-11 DIAGNOSIS — Z79899 Other long term (current) drug therapy: Secondary | ICD-10-CM | POA: Insufficient documentation

## 2012-08-11 DIAGNOSIS — Z87442 Personal history of urinary calculi: Secondary | ICD-10-CM | POA: Insufficient documentation

## 2012-08-11 DIAGNOSIS — Z8742 Personal history of other diseases of the female genital tract: Secondary | ICD-10-CM | POA: Insufficient documentation

## 2012-08-11 DIAGNOSIS — Z8719 Personal history of other diseases of the digestive system: Secondary | ICD-10-CM | POA: Insufficient documentation

## 2012-08-11 DIAGNOSIS — R109 Unspecified abdominal pain: Secondary | ICD-10-CM

## 2012-08-11 LAB — CBC WITH DIFFERENTIAL/PLATELET
Basophils Absolute: 0.1 10*3/uL (ref 0.0–0.1)
HCT: 35.8 % — ABNORMAL LOW (ref 36.0–46.0)
Lymphocytes Relative: 30 % (ref 12–46)
Monocytes Absolute: 0.5 10*3/uL (ref 0.1–1.0)
Neutro Abs: 4.4 10*3/uL (ref 1.7–7.7)
Neutrophils Relative %: 59 % (ref 43–77)
RDW: 11.9 % (ref 11.5–15.5)
WBC: 7.4 10*3/uL (ref 4.0–10.5)

## 2012-08-11 LAB — URINALYSIS, ROUTINE W REFLEX MICROSCOPIC
Bilirubin Urine: NEGATIVE
Glucose, UA: NEGATIVE mg/dL
Ketones, ur: NEGATIVE mg/dL
Protein, ur: NEGATIVE mg/dL
Urobilinogen, UA: 0.2 mg/dL (ref 0.0–1.0)

## 2012-08-11 LAB — COMPREHENSIVE METABOLIC PANEL
ALT: 7 U/L (ref 0–35)
AST: 13 U/L (ref 0–37)
Alkaline Phosphatase: 61 U/L (ref 39–117)
CO2: 27 mEq/L (ref 19–32)
Chloride: 99 mEq/L (ref 96–112)
Creatinine, Ser: 0.64 mg/dL (ref 0.50–1.10)
GFR calc non Af Amer: >90 mL/min (ref >90)
Potassium: 4.4 mEq/L (ref 3.5–5.1)
Sodium: 133 mEq/L — ABNORMAL LOW (ref 135–145)
Total Bilirubin: 0.5 mg/dL (ref 0.3–1.2)

## 2012-08-11 LAB — URINE MICROSCOPIC-ADD ON

## 2012-08-11 MED ORDER — SODIUM CHLORIDE 0.9 % IV SOLN: Freq: Once | INTRAVENOUS | Status: DC

## 2012-08-11 MED ORDER — KETOROLAC TROMETHAMINE 30 MG/ML IJ SOLN
30.0000 mg | Freq: Once | INTRAMUSCULAR | Status: AC
  Administered 2012-08-11: 30 mg via INTRAVENOUS
  Filled 2012-08-11: qty 1

## 2012-08-11 MED ORDER — METOCLOPRAMIDE HCL 5 MG/ML IJ SOLN
10.0000 mg | Freq: Once | INTRAMUSCULAR | Status: AC
  Administered 2012-08-11: 10 mg via INTRAVENOUS
  Filled 2012-08-11: qty 2

## 2012-08-11 MED ORDER — SODIUM CHLORIDE 0.9 % IV BOLUS (SEPSIS)
1000.0000 mL | Freq: Once | INTRAVENOUS | Status: AC
  Administered 2012-08-11: 1000 mL via INTRAVENOUS

## 2012-08-11 NOTE — ED Notes (Signed)
Has felt like she had UTI for 2 weeks. Started this am with abdominal pain. N/V x 1 day. Constipated 13 days. Fever a couple days ago.

## 2012-08-11 NOTE — ED Notes (Signed)
Pt st's Toradol doesn't work for her, pt's st's nothing works for her except dilaudid.

## 2012-08-12 NOTE — ED Notes (Signed)
Pt upset that she wasn't going to get dilaudid.  Pt started to cry and said that she wanted to go home, refused all other treatment and left AMA.  EDP Nanavati notified.

## 2012-08-12 NOTE — ED Provider Notes (Signed)
History     CSN: [id number]  Arrival date & time 08/11/12  2200   First MD Initiated Contact with Patient 08/11/12 2306      Chief Complaint  Patient presents with  . Abdominal Pain    (Consider location/radiation/quality/duration/timing/severity/associated sxs/prior treatment) HPI Comments: Pt with cc of abd pain. Pt has hx of UTI, renal stones. Pt reports that she had a stone removed 2 months ago. Her pain started 4 days ago, and is located on the the right flank, and also the abdomen. The pain is constant, more severe today than before, so she comes to the ER. She also reports dysuria, and fevers of tmax 102, at home. Tylenol is not helping with her pain. No vaginal discharge, does admit to PID in the past.   Patient is a 21 y.o. female presenting with abdominal pain. The history is provided by the patient.  Abdominal Pain The primary symptoms of the illness include abdominal pain, nausea, vomiting and dysuria. The primary symptoms of the illness do not include shortness of breath or diarrhea.  Symptoms associated with the illness do not include constipation.    Past Medical History  Diagnosis Date  . IBS (irritable bowel syndrome)   . Bipolar 1 disorder   . Mononucleosis   . Kidney stones   . PID (acute pelvic inflammatory disease) 04/28/2012  . Chlamydia 04/29/2012  . C. difficile diarrhea   . Anemia   . Anxiety   . Arthritis   . Depression   . Ulcer     Past Surgical History  Procedure Date  . Kidney stone surgery   . Tonsillectomy   . Inner ear surgery   . Wisdom tooth extraction     Family History  Problem Relation Age of Onset  . Bipolar disorder Mother   . Cancer Father   . ADD / ADHD Brother   . Cancer Maternal Grandmother   . Cancer Maternal Grandfather   . Cancer Paternal Grandmother   . Cancer Paternal Grandfather     History  Substance Use Topics  . Smoking status: Current Every Day Smoker -- 0.5 packs/day for 10 years  . Smokeless tobacco:  Not on file  . Alcohol Use: No    OB History    Grav Para Term Preterm Abortions TAB SAB Ect Mult Living   0               Review of Systems  Constitutional: Negative for activity change.  HENT: Negative for facial swelling and neck pain.   Respiratory: Negative for cough, shortness of breath and wheezing.   Cardiovascular: Negative for chest pain.  Gastrointestinal: Positive for nausea, vomiting and abdominal pain. Negative for diarrhea, constipation, blood in stool and abdominal distention.  Genitourinary: Positive for dysuria. Negative for difficulty urinating.  Skin: Negative for color change.  Neurological: Negative for speech difficulty.  Hematological: Does not bruise/bleed easily.  Psychiatric/Behavioral: Negative for confusion.    Allergies  Azithromycin and Prednisone  Home Medications   Current Outpatient Rx  Name  Route  Sig  Dispense  Refill  . ACETAMINOPHEN 500 MG PO TABS   Oral   Take 1 tablet (500 mg total) by mouth every 6 (six) hours as needed for pain.   30 tablet   0   . IBUPROFEN 200 MG PO TABS   Oral   Take 400-800 mg by mouth every 6 (six) hours as needed. For pain         . NORGESTIMATE-ETH  ESTRADIOL 0.25-35 MG-MCG PO TABS   Oral   Take 1 tablet by mouth daily.           Marland Kitchen ONDANSETRON HCL 4 MG PO TABS   Oral   Take 1 tablet (4 mg total) by mouth every 8 (eight) hours as needed for nausea.   30 tablet   0   . PHENAZOPYRIDINE HCL 200 MG PO TABS   Oral   Take 200 mg by mouth 2 (two) times daily as needed. For urinary pain         . ZOLPIDEM TARTRATE 10 MG PO TABS   Oral   Take 10 mg by mouth at bedtime as needed. For sleep           BP 111/68  Pulse 112  Temp 97.8 F (36.6 C) (Oral)  Resp 16  SpO2 97%  LMP 06/27/2012  Physical Exam  Constitutional: She is oriented to person, place, and time. She appears well-developed.  HENT:  Head: Normocephalic and atraumatic.  Eyes: Conjunctivae normal and EOM are normal. Pupils  are equal, round, and reactive to light.  Neck: Normal range of motion. Neck supple.  Cardiovascular: Normal rate, regular rhythm and normal heart sounds.   Pulmonary/Chest: Effort normal and breath sounds normal. No respiratory distress.  Abdominal: Soft. Bowel sounds are normal. She exhibits no distension. There is no tenderness. There is no rebound and no guarding.  Neurological: She is alert and oriented to person, place, and time.  Skin: Skin is warm and dry.    ED Course  Procedures (including critical care time)  Labs Reviewed  CBC WITH DIFFERENTIAL - Abnormal; Notable for the following:    HCT 35.8 (*)     All other components within normal limits  COMPREHENSIVE METABOLIC PANEL - Abnormal; Notable for the following:    Sodium 133 (*)     All other components within normal limits  URINALYSIS, ROUTINE W REFLEX MICROSCOPIC - Abnormal; Notable for the following:    APPearance CLOUDY (*)     Hgb urine dipstick LARGE (*)     Leukocytes, UA MODERATE (*)     All other components within normal limits  URINE MICROSCOPIC-ADD ON - Abnormal; Notable for the following:    Squamous Epithelial / LPF MANY (*)     Bacteria, UA MANY (*)     All other components within normal limits  URINE CULTURE  PREGNANCY, URINE  WET PREP, GENITAL  GC/CHLAMYDIA PROBE AMP   No results found.   No diagnosis found.    MDM  DDX: GC/Chlamydia Trichomonas PID UTI Ectopic Pyelonephritis Renal colic  Pt comes in with cc of flank pain, right sided, and abd pain, periumbilical and in there suprapubic region. Reports hx of PID and renal stones. Pt has flank tenderness. UA is negative for infection - cultures sent. No fevers in the ER. Review shows multiple ED visits, and 3 CT since July  - last one showing no renal colic. Based on my exam, i had ordered a CT renal stone protocol - but her UA looks clean, and she has demonstrated drug seeking behavior in the past, so i am going to get US renal to  decrease radiation exposure and ensure there is no evidence of hydronephrosis. We will get a pelvic exam as well. Toradol and reglan given.   12:21 AM Patient apparently wanted dilaudid per RN, and decided to leave AMA, when i had instructed the RN that we will not give out iv narcotics.  Derwood Kaplan, MD 08/12/12 0454

## 2012-08-13 LAB — URINE CULTURE: Colony Count: >=100000

## 2012-09-08 ENCOUNTER — Ambulatory Visit (INDEPENDENT_AMBULATORY_CARE_PROVIDER_SITE_OTHER): Payer: Managed Care, Other (non HMO) | Admitting: Family Medicine

## 2012-09-08 ENCOUNTER — Ambulatory Visit: Payer: Managed Care, Other (non HMO)

## 2012-09-08 VITALS — BP 93/66 | HR 153 | Temp 97.9°F | Resp 18 | Ht 64.0 in | Wt 106.0 lb

## 2012-09-08 DIAGNOSIS — R509 Fever, unspecified: Secondary | ICD-10-CM

## 2012-09-08 DIAGNOSIS — R059 Cough, unspecified: Secondary | ICD-10-CM

## 2012-09-08 DIAGNOSIS — R05 Cough: Secondary | ICD-10-CM

## 2012-09-08 DIAGNOSIS — R110 Nausea: Secondary | ICD-10-CM

## 2012-09-08 DIAGNOSIS — J209 Acute bronchitis, unspecified: Secondary | ICD-10-CM

## 2012-09-08 LAB — POCT CBC
Granulocyte percent: 67.8 %G (ref 37–80)
HCT, POC: 43.9 % (ref 37.7–47.9)
Hemoglobin: 13.6 g/dL (ref 12.2–16.2)
MCHC: 31 g/dL — AB (ref 31.8–35.4)
MPV: 9.8 fL (ref 0–99.8)
POC Granulocyte: 4.4 (ref 2–6.9)
POC LYMPH PERCENT: 25.4 %L (ref 10–50)
POC MID %: 6.8 %M (ref 0–12)
RDW, POC: 13.1 %

## 2012-09-08 MED ORDER — ONDANSETRON HCL 4 MG PO TABS: 4.0000 mg | ORAL_TABLET | Freq: Three times a day (TID) | ORAL | Status: DC | PRN

## 2012-09-08 MED ORDER — PROMETHAZINE HCL 25 MG PO TABS: 25.0000 mg | ORAL_TABLET | Freq: Three times a day (TID) | ORAL | Status: DC | PRN

## 2012-09-08 MED ORDER — DOXYCYCLINE HYCLATE 100 MG PO TABS: 100.0000 mg | ORAL_TABLET | Freq: Two times a day (BID) | ORAL | Status: DC

## 2012-09-08 MED ORDER — BENZONATATE 100 MG PO CAPS: 200.0000 mg | ORAL_CAPSULE | Freq: Three times a day (TID) | ORAL | Status: DC | PRN

## 2012-09-08 NOTE — Progress Notes (Signed)
  Subjective:    Patient ID: Tonya Davidson, female    DOB: [birth year], 21 y.o.   MRN: [id number]  HPI 21 yr old CF presents with a 1 week h/o fever and cough. Tmax was 103 and that was in the first day or 2 of s/sx.  She has had little to no sneezing and no runny nose. Slight nausea, no vomiting.  She is having nasal congestion.   Review of Systems  All other systems reviewed and are negative.       Objective:   Physical Exam  Nursing note and vitals reviewed. Constitutional: She is oriented to person, place, and time. She appears well-developed and well-nourished.  HENT:  Head: Normocephalic and atraumatic.  Right Ear: External ear normal.  Neck: Normal range of motion. Neck supple.  Cardiovascular: Normal rate, regular rhythm and normal heart sounds.   Pulmonary/Chest: Effort normal. She has no wheezes. She has no rales.       Breath sounds are coarse  Lymphadenopathy:    She has no cervical adenopathy.  Neurological: She is alert and oriented to person, place, and time.  Skin: Skin is warm and dry.      Results for orders placed in visit on 09/08/12  POCT CBC      Component Value Range   WBC 6.5  4.6 - 10.2 K/uL   Lymph, poc 1.7  0.6 - 3.4   POC LYMPH PERCENT 25.4  10 - 50 %L   MID (cbc) 0.4  0 - 0.9   POC MID % 6.8  0 - 12 %M   POC Granulocyte 4.4  2 - 6.9   Granulocyte percent 67.8  37 - 80 %G   RBC 4.47  4.04 - 5.48 M/uL   Hemoglobin 13.6  12.2 - 16.2 g/dL   HCT, POC 56.2  13.0 - 47.9 %   MCV 98.2 (*) 80 - 97 fL   MCH, POC 30.4  27 - 31.2 pg   MCHC 31.0 (*) 31.8 - 35.4 g/dL   RDW, POC 86.5     Platelet Count, POC 279  142 - 424 K/uL   MPV 9.8  0 - 99.8 fL   [facility] reading (PRIMARY) by  Dr. Conley Rolls as no infiltrate.  She does have increased markings throughout.      Assessment & Plan:  Likely bronchitis developing secondary to resolving influenza. Will cover for atypicals. Fluids and rest. Smoking cessation advised. She requests hydromet by name and  questions me at length about all medications I am prescribing for her.

## 2012-10-18 ENCOUNTER — Emergency Department (HOSPITAL_COMMUNITY): Payer: Self-pay

## 2012-10-18 ENCOUNTER — Emergency Department (HOSPITAL_COMMUNITY)
Admission: EM | Admit: 2012-10-18 | Discharge: 2012-10-18 | Disposition: A | Discharge status: Home / Self Care | Payer: Self-pay | Attending: Emergency Medicine | Admitting: Emergency Medicine

## 2012-10-18 ENCOUNTER — Encounter (HOSPITAL_COMMUNITY): Payer: Self-pay | Admitting: Emergency Medicine

## 2012-10-18 DIAGNOSIS — Z8659 Personal history of other mental and behavioral disorders: Secondary | ICD-10-CM | POA: Insufficient documentation

## 2012-10-18 DIAGNOSIS — F17200 Nicotine dependence, unspecified, uncomplicated: Secondary | ICD-10-CM | POA: Insufficient documentation

## 2012-10-18 DIAGNOSIS — N730 Acute parametritis and pelvic cellulitis: Secondary | ICD-10-CM | POA: Insufficient documentation

## 2012-10-18 DIAGNOSIS — Z862 Personal history of diseases of the blood and blood-forming organs and certain disorders involving the immune mechanism: Secondary | ICD-10-CM | POA: Insufficient documentation

## 2012-10-18 DIAGNOSIS — N898 Other specified noninflammatory disorders of vagina: Secondary | ICD-10-CM | POA: Insufficient documentation

## 2012-10-18 DIAGNOSIS — Z8739 Personal history of other diseases of the musculoskeletal system and connective tissue: Secondary | ICD-10-CM | POA: Insufficient documentation

## 2012-10-18 DIAGNOSIS — Z87440 Personal history of urinary (tract) infections: Secondary | ICD-10-CM | POA: Insufficient documentation

## 2012-10-18 DIAGNOSIS — R350 Frequency of micturition: Secondary | ICD-10-CM | POA: Insufficient documentation

## 2012-10-18 DIAGNOSIS — Z87442 Personal history of urinary calculi: Secondary | ICD-10-CM | POA: Insufficient documentation

## 2012-10-18 DIAGNOSIS — Z8669 Personal history of other diseases of the nervous system and sense organs: Secondary | ICD-10-CM | POA: Insufficient documentation

## 2012-10-18 DIAGNOSIS — Z79899 Other long term (current) drug therapy: Secondary | ICD-10-CM | POA: Insufficient documentation

## 2012-10-18 DIAGNOSIS — Z87448 Personal history of other diseases of urinary system: Secondary | ICD-10-CM | POA: Insufficient documentation

## 2012-10-18 DIAGNOSIS — Z8719 Personal history of other diseases of the digestive system: Secondary | ICD-10-CM | POA: Insufficient documentation

## 2012-10-18 DIAGNOSIS — R112 Nausea with vomiting, unspecified: Secondary | ICD-10-CM | POA: Insufficient documentation

## 2012-10-18 DIAGNOSIS — N949 Unspecified condition associated with female genital organs and menstrual cycle: Secondary | ICD-10-CM | POA: Insufficient documentation

## 2012-10-18 DIAGNOSIS — M549 Dorsalgia, unspecified: Secondary | ICD-10-CM | POA: Insufficient documentation

## 2012-10-18 DIAGNOSIS — Z8619 Personal history of other infectious and parasitic diseases: Secondary | ICD-10-CM | POA: Insufficient documentation

## 2012-10-18 LAB — WET PREP, GENITAL: Trich, Wet Prep: NONE SEEN

## 2012-10-18 LAB — RPR: RPR Ser Ql: NONREACTIVE

## 2012-10-18 LAB — URINALYSIS, ROUTINE W REFLEX MICROSCOPIC
Glucose, UA: NEGATIVE mg/dL
Ketones, ur: NEGATIVE mg/dL
Leukocytes, UA: NEGATIVE
Nitrite: NEGATIVE
Protein, ur: NEGATIVE mg/dL

## 2012-10-18 LAB — CBC WITH DIFFERENTIAL/PLATELET
Eosinophils Absolute: 0.3 10*3/uL (ref 0.0–0.7)
Eosinophils Relative: 3 % (ref 0–5)
HCT: 35.6 % — ABNORMAL LOW (ref 36.0–46.0)
Hemoglobin: 12 g/dL (ref 12.0–15.0)
Lymphocytes Relative: 30 % (ref 12–46)
Lymphs Abs: 2.7 10*3/uL (ref 0.7–4.0)
MCH: 30.8 pg (ref 26.0–34.0)
MCV: 91.3 fL (ref 78.0–100.0)
Monocytes Absolute: 0.6 10*3/uL (ref 0.1–1.0)
Monocytes Relative: 7 % (ref 3–12)
RBC: 3.9 MIL/uL (ref 3.87–5.11)
WBC: 9.2 10*3/uL (ref 4.0–10.5)

## 2012-10-18 LAB — URINE MICROSCOPIC-ADD ON

## 2012-10-18 MED ORDER — HYDROMORPHONE HCL PF 1 MG/ML IJ SOLN
1.0000 mg | Freq: Once | INTRAMUSCULAR | Status: AC
  Administered 2012-10-18: 1 mg via INTRAVENOUS
  Filled 2012-10-18: qty 1

## 2012-10-18 MED ORDER — DOXYCYCLINE HYCLATE 100 MG PO TABS
100.0000 mg | ORAL_TABLET | Freq: Once | ORAL | Status: AC
  Administered 2012-10-18: 100 mg via ORAL
  Filled 2012-10-18: qty 1

## 2012-10-18 MED ORDER — CEFTRIAXONE SODIUM 250 MG IJ SOLR
250.0000 mg | Freq: Once | INTRAMUSCULAR | Status: AC
  Administered 2012-10-18: 250 mg via INTRAMUSCULAR
  Filled 2012-10-18: qty 250

## 2012-10-18 MED ORDER — DOXYCYCLINE HYCLATE 50 MG PO CAPS: 100.0000 mg | ORAL_CAPSULE | Freq: Two times a day (BID) | ORAL | Status: DC

## 2012-10-18 MED ORDER — OXYCODONE-ACETAMINOPHEN 5-325 MG PO TABS
1.0000 | ORAL_TABLET | Freq: Once | ORAL | Status: AC
  Administered 2012-10-18: 1 via ORAL
  Filled 2012-10-18: qty 1

## 2012-10-18 MED ORDER — OXYCODONE-ACETAMINOPHEN 5-325 MG PO TABS: 2.0000 | ORAL_TABLET | Freq: Four times a day (QID) | ORAL | Status: DC | PRN

## 2012-10-18 MED ORDER — SODIUM CHLORIDE 0.9 % IV BOLUS (SEPSIS)
1000.0000 mL | Freq: Once | INTRAVENOUS | Status: AC
  Administered 2012-10-18: 1000 mL via INTRAVENOUS

## 2012-10-18 MED ORDER — ONDANSETRON HCL 4 MG/2ML IJ SOLN
4.0000 mg | Freq: Once | INTRAMUSCULAR | Status: AC
  Administered 2012-10-18: 4 mg via INTRAVENOUS
  Filled 2012-10-18: qty 2

## 2012-10-18 MED ORDER — FLUCONAZOLE 200 MG PO TABS: 200.0000 mg | ORAL_TABLET | Freq: Every day | ORAL | Status: AC

## 2012-10-18 NOTE — ED Provider Notes (Signed)
History     CSN: [id number]  Arrival date & time 10/18/12  0500   First MD Initiated Contact with Patient 10/18/12 [phone]      Chief Complaint  Patient presents with  . Dysuria    (Consider location/radiation/quality/duration/timing/severity/associated sxs/prior treatment) HPI Comments: Patient states about 2 weeks ago treated for UTI with 3 days of Septra which she completed along with pyridium.  1-2 days after completing antibiotic she again started having dysuria.  N ow she is having a vaginal discharge, back pain, nausea and vomiting.  Denies fever   Patient is a 22 y.o. female presenting with dysuria. The history is provided by the patient.  Dysuria  This is a recurrent problem. The current episode started more than 1 week ago. The problem occurs every urination. The problem has not changed since onset.The quality of the pain is described as burning. The pain is moderate. There has been no fever. She is sexually active. There is a history of pyelonephritis. Associated symptoms include nausea, vomiting, discharge and frequency. Pertinent negatives include no chills. Her past medical history is significant for recurrent UTIs.    Past Medical History  Diagnosis Date  . IBS (irritable bowel syndrome)   . Bipolar 1 disorder   . Mononucleosis   . Kidney stones   . PID (acute pelvic inflammatory disease) 04/28/2012  . Chlamydia 04/29/2012  . C. difficile diarrhea   . Anemia   . Anxiety   . Arthritis   . Depression   . Ulcer     Past Surgical History  Procedure Date  . Kidney stone surgery   . Tonsillectomy   . Inner ear surgery   . Wisdom tooth extraction     Family History  Problem Relation Age of Onset  . Bipolar disorder Mother   . Cancer Father   . ADD / ADHD Brother   . Cancer Maternal Grandmother   . Cancer Maternal Grandfather   . Cancer Paternal Grandmother   . Cancer Paternal Grandfather     History  Substance Use Topics  . Smoking status: Current Every  Day Smoker -- 0.5 packs/day for 10 years    Types: Cigarettes  . Smokeless tobacco: Not on file  . Alcohol Use: No    OB History    Grav Para Term Preterm Abortions TAB SAB Ect Mult Living   0               Review of Systems  Constitutional: Negative for fever and chills.  HENT: Negative.   Eyes: Negative.   Respiratory: Negative for shortness of breath.   Cardiovascular: Negative for chest pain and leg swelling.  Gastrointestinal: Positive for nausea and vomiting. Negative for abdominal pain, diarrhea and constipation.  Genitourinary: Positive for dysuria, frequency, vaginal discharge and vaginal pain. Negative for vaginal bleeding and pelvic pain.  Musculoskeletal: Positive for back pain.  Skin: Negative for rash and wound.  Neurological: Negative for dizziness.    Allergies  Azithromycin and Prednisone  Home Medications   Current Outpatient Rx  Name  Route  Sig  Dispense  Refill  . IBUPROFEN 200 MG PO TABS   Oral   Take 400-800 mg by mouth every 6 (six) hours as needed. For pain         . NORGESTIMATE-ETH ESTRADIOL 0.25-35 MG-MCG PO TABS   Oral   Take 1 tablet by mouth daily.           Marland Kitchen ONDANSETRON HCL 4 MG PO TABS  Oral   Take 1 tablet (4 mg total) by mouth every 8 (eight) hours as needed for nausea. In daytime   30 tablet   0   . PHENAZOPYRIDINE HCL 200 MG PO TABS   Oral   Take 200 mg by mouth 2 (two) times daily as needed. For urinary pain         . PROMETHAZINE HCL 25 MG PO TABS   Oral   Take 1 tablet (25 mg total) by mouth every 8 (eight) hours as needed for nausea.   20 tablet   0   . ZOLPIDEM TARTRATE 10 MG PO TABS   Oral   Take 10 mg by mouth at bedtime as needed. For sleep         . DOXYCYCLINE HYCLATE 50 MG PO CAPS   Oral   Take 2 capsules (100 mg total) by mouth 2 (two) times daily.   28 capsule   0   . FLUCONAZOLE 200 MG PO TABS   Oral   Take 1 tablet (200 mg total) by mouth daily.   7 tablet   0   .  OXYCODONE-ACETAMINOPHEN 5-325 MG PO TABS   Oral   Take 2 tablets by mouth every 6 (six) hours as needed for pain.   15 tablet   0     BP 97/56  Pulse 98  Temp 98.1 F (36.7 C) (Oral)  Resp 20  SpO2 95%  LMP 09/29/2012  Physical Exam  Constitutional: She appears well-developed and well-nourished.  HENT:  Head: Normocephalic.  Eyes: Pupils are equal, round, and reactive to light.  Neck: Normal range of motion.  Cardiovascular: Normal rate.   Pulmonary/Chest: She is in respiratory distress.  Abdominal: Soft. She exhibits no distension. There is tenderness.  Genitourinary: Guaiac negative stool. Cervix exhibits motion tenderness and discharge. Right adnexum displays tenderness and fullness. Vaginal discharge found.    ED Course  Procedures (including critical care time)  Labs Reviewed  URINALYSIS, ROUTINE W REFLEX MICROSCOPIC - Abnormal; Notable for the following:    Specific Gravity, Urine 1.004 (*)     Hgb urine dipstick TRACE (*)     All other components within normal limits  WET PREP, GENITAL - Abnormal; Notable for the following:    WBC, Wet Prep HPF POC FEW (*)     All other components within normal limits  CBC WITH DIFFERENTIAL - Abnormal; Notable for the following:    HCT 35.6 (*)     All other components within normal limits  GC/CHLAMYDIA PROBE AMP  RPR  URINE MICROSCOPIC-ADD ON   US Transvaginal Non-ob  10/18/2012  *RADIOLOGY REPORT*  Clinical Data: Right lower quadrant pelvic pain.  TRANSABDOMINAL AND TRANSVAGINAL ULTRASOUND OF PELVIS Technique:  Both transabdominal and transvaginal ultrasound examinations of the pelvis were performed. Transabdominal technique was performed for global imaging of the pelvis including uterus, ovaries, adnexal regions, and pelvic cul-de-sac.  It was necessary to proceed with endovaginal exam following the transabdominal exam to visualize the endometrium.  Comparison:  Ultrasound dated 04/01/2012  Findings:  Uterus: Normal.  6.5 x  3.3 x 3.6 cm.  Endometrium: Normal.  5.5 mm in thickness.  Right ovary:  Normal.  3.7 x 1.8 x 1.9 cm.  Left ovary: Normal.  3.0 x 1.7 x 2.4 cm.  Other findings: No free fluid.  No adnexal masses.  IMPRESSION: Normal study. No evidence of pelvic mass or other significant abnormality.   Original Report Authenticated By: Francene Boyers, M.D.  US Pelvis Complete  10/18/2012  *RADIOLOGY REPORT*  Clinical Data: Right lower quadrant pelvic pain.  TRANSABDOMINAL AND TRANSVAGINAL ULTRASOUND OF PELVIS Technique:  Both transabdominal and transvaginal ultrasound examinations of the pelvis were performed. Transabdominal technique was performed for global imaging of the pelvis including uterus, ovaries, adnexal regions, and pelvic cul-de-sac.  It was necessary to proceed with endovaginal exam following the transabdominal exam to visualize the endometrium.  Comparison:  Ultrasound dated 04/01/2012  Findings:  Uterus: Normal.  6.5 x 3.3 x 3.6 cm.  Endometrium: Normal.  5.5 mm in thickness.  Right ovary:  Normal.  3.7 x 1.8 x 1.9 cm.  Left ovary: Normal.  3.0 x 1.7 x 2.4 cm.  Other findings: No free fluid.  No adnexal masses.  IMPRESSION: Normal study. No evidence of pelvic mass or other significant abnormality.   Original Report Authenticated By: Francene Boyers, M.D.      1. PID (acute pelvic inflammatory disease)       MDM          Arman Filter, NP 10/19/12 1958

## 2012-10-18 NOTE — ED Notes (Signed)
Pt alert and oriented x4. Respirations even and unlabored, bilateral symmetrical rise and fall of chest. Skin warm and dry. In no acute distress. Denies needs.   

## 2012-10-18 NOTE — ED Notes (Signed)
Patient is alert and oriented x3.  She is complaining of burning pain after urination that radiates to her back and she rates her pain 10 of 10.  She was seen at [facility] in [place] and Diagnoses with a UTI.

## 2012-10-18 NOTE — ED Notes (Signed)
Pt escorted to discharge window. Pt verbalized understanding discharge instructions. In no acute distress.  

## 2012-10-18 NOTE — ED Notes (Signed)
Pt states about a week and a half ago she started having urinary tract infection sxs  Pt states before that she was taking bactrim for 3 days  Pt states she has dysuria, hematuria, emesis, and back pain  Pt states she also has a yeast infection with itching and discharge

## 2012-10-18 NOTE — ED Provider Notes (Signed)
Medical screening examination/treatment/procedure(s) were performed by non-physician practitioner and as supervising physician I was immediately available for consultation/collaboration.  Olivia Mackie, MD 10/18/12 1949

## 2012-10-18 NOTE — ED Provider Notes (Signed)
This is a 22 year old female, who presents emergency department with chief complaint of dysuria. Patient was originally seen by Manus Rudd, NP, who signed the patient out to me at shift and off. Plan is to obtain a pelvic ultrasound to rule out torsion.  Results for orders placed during the hospital encounter of 10/18/12  URINALYSIS, ROUTINE W REFLEX MICROSCOPIC      Component Value Range   Color, Urine YELLOW  YELLOW   APPearance CLEAR  CLEAR   Specific Gravity, Urine 1.004 (*) 1.005 - 1.030   pH 6.5  5.0 - 8.0   Glucose, UA NEGATIVE  NEGATIVE mg/dL   Hgb urine dipstick TRACE (*) NEGATIVE   Bilirubin Urine NEGATIVE  NEGATIVE   Ketones, ur NEGATIVE  NEGATIVE mg/dL   Protein, ur NEGATIVE  NEGATIVE mg/dL   Urobilinogen, UA 0.2  0.0 - 1.0 mg/dL   Nitrite NEGATIVE  NEGATIVE   Leukocytes, UA NEGATIVE  NEGATIVE  WET PREP, GENITAL      Component Value Range   Yeast Wet Prep HPF POC NONE SEEN  NONE SEEN   Trich, Wet Prep NONE SEEN  NONE SEEN   Clue Cells Wet Prep HPF POC NONE SEEN  NONE SEEN   WBC, Wet Prep HPF POC FEW (*) NONE SEEN  CBC WITH DIFFERENTIAL      Component Value Range   WBC 9.2  4.0 - 10.5 K/uL   RBC 3.90  3.87 - 5.11 MIL/uL   Hemoglobin 12.0  12.0 - 15.0 g/dL   HCT 40.9 (*) 81.1 - 91.4 %   MCV 91.3  78.0 - 100.0 fL   MCH 30.8  26.0 - 34.0 pg   MCHC 33.7  30.0 - 36.0 g/dL   RDW 78.2  95.6 - 21.3 %   Platelets 300  150 - 400 K/uL   Neutrophils Relative 61  43 - 77 %   Neutro Abs 5.6  1.7 - 7.7 K/uL   Lymphocytes Relative 30  12 - 46 %   Lymphs Abs 2.7  0.7 - 4.0 K/uL   Monocytes Relative 7  3 - 12 %   Monocytes Absolute 0.6  0.1 - 1.0 K/uL   Eosinophils Relative 3  0 - 5 %   Eosinophils Absolute 0.3  0.0 - 0.7 K/uL   Basophils Relative 1  0 - 1 %   Basophils Absolute 0.1  0.0 - 0.1 K/uL  URINE MICROSCOPIC-ADD ON      Component Value Range   Squamous Epithelial / LPF RARE  RARE   RBC / HPF 0-2  <3 RBC/hpf   Bacteria, UA RARE  RARE   US Transvaginal  Non-ob  10/18/2012  *RADIOLOGY REPORT*  Clinical Data: Right lower quadrant pelvic pain.  TRANSABDOMINAL AND TRANSVAGINAL ULTRASOUND OF PELVIS Technique:  Both transabdominal and transvaginal ultrasound examinations of the pelvis were performed. Transabdominal technique was performed for global imaging of the pelvis including uterus, ovaries, adnexal regions, and pelvic cul-de-sac.  It was necessary to proceed with endovaginal exam following the transabdominal exam to visualize the endometrium.  Comparison:  Ultrasound dated 04/01/2012  Findings:  Uterus: Normal.  6.5 x 3.3 x 3.6 cm.  Endometrium: Normal.  5.5 mm in thickness.  Right ovary:  Normal.  3.7 x 1.8 x 1.9 cm.  Left ovary: Normal.  3.0 x 1.7 x 2.4 cm.  Other findings: No free fluid.  No adnexal masses.  IMPRESSION: Normal study. No evidence of pelvic mass or other significant abnormality.   Original Report Authenticated  By: Francene Boyers, M.D.    US Pelvis Complete  10/18/2012  *RADIOLOGY REPORT*  Clinical Data: Right lower quadrant pelvic pain.  TRANSABDOMINAL AND TRANSVAGINAL ULTRASOUND OF PELVIS Technique:  Both transabdominal and transvaginal ultrasound examinations of the pelvis were performed. Transabdominal technique was performed for global imaging of the pelvis including uterus, ovaries, adnexal regions, and pelvic cul-de-sac.  It was necessary to proceed with endovaginal exam following the transabdominal exam to visualize the endometrium.  Comparison:  Ultrasound dated 04/01/2012  Findings:  Uterus: Normal.  6.5 x 3.3 x 3.6 cm.  Endometrium: Normal.  5.5 mm in thickness.  Right ovary:  Normal.  3.7 x 1.8 x 1.9 cm.  Left ovary: Normal.  3.0 x 1.7 x 2.4 cm.  Other findings: No free fluid.  No adnexal masses.  IMPRESSION: Normal study. No evidence of pelvic mass or other significant abnormality.   Original Report Authenticated By: Francene Boyers, M.D.       8:02 AM Pelvic ultrasound is normal. I'm going to discharge the patient on  doxycycline, patient has received ceftriaxone and doxycycline in the ED. Pain control is been managed with Dilaudid. I'm going to discharge the patient with doxycycline. Patient understands and agrees with the plan. She is stable and ready for discharge.   Roxy Horseman, PA-C 10/18/12 0804  Roxy Horseman, PA-C 10/18/12 1230

## 2012-10-19 LAB — GC/CHLAMYDIA PROBE AMP: CT Probe RNA: NEGATIVE

## 2012-10-20 ENCOUNTER — Encounter (HOSPITAL_COMMUNITY): Payer: Self-pay

## 2012-10-20 ENCOUNTER — Inpatient Hospital Stay (HOSPITAL_COMMUNITY)
Admission: AD | Admit: 2012-10-20 | Discharge: 2012-10-21 | Disposition: A | Discharge status: Home / Self Care | Payer: Self-pay | Source: Ambulatory Visit | Attending: Obstetrics & Gynecology | Admitting: Obstetrics & Gynecology

## 2012-10-20 DIAGNOSIS — R112 Nausea with vomiting, unspecified: Secondary | ICD-10-CM | POA: Insufficient documentation

## 2012-10-20 DIAGNOSIS — K589 Irritable bowel syndrome without diarrhea: Secondary | ICD-10-CM | POA: Insufficient documentation

## 2012-10-20 DIAGNOSIS — R109 Unspecified abdominal pain: Secondary | ICD-10-CM | POA: Insufficient documentation

## 2012-10-20 HISTORY — DX: Papillomavirus as the cause of diseases classified elsewhere: B97.7

## 2012-10-20 HISTORY — DX: Unspecified abnormal cytological findings in specimens from cervix uteri: R87.619

## 2012-10-20 HISTORY — DX: Reserved for concepts with insufficient information to code with codable children: IMO0002

## 2012-10-20 NOTE — MAU Note (Signed)
Pt presents and states she is being treated for Doxycycline and she keeps vomiting up the medicine. Was told by the ER to call her OB/GYN if she did not get any better. Called [facility]  Today and they told her to come here for direct admit.  States she is having lower abd pain, "extreme fever"

## 2012-10-21 LAB — URINE MICROSCOPIC-ADD ON

## 2012-10-21 LAB — CBC WITH DIFFERENTIAL/PLATELET
Basophils Absolute: 0 10*3/uL (ref 0.0–0.1)
Basophils Relative: 0 % (ref 0–1)
Eosinophils Absolute: 0.1 10*3/uL (ref 0.0–0.7)
MCH: 31.1 pg (ref 26.0–34.0)
MCHC: 33.7 g/dL (ref 30.0–36.0)
Monocytes Absolute: 0.5 10*3/uL (ref 0.1–1.0)
Neutro Abs: 6.1 10*3/uL (ref 1.7–7.7)
Neutrophils Relative %: 62 % (ref 43–77)
RDW: 11.8 % (ref 11.5–15.5)

## 2012-10-21 LAB — URINALYSIS, ROUTINE W REFLEX MICROSCOPIC
Bilirubin Urine: NEGATIVE
Nitrite: NEGATIVE
Protein, ur: 30 mg/dL — AB
Specific Gravity, Urine: 1.02 (ref 1.005–1.030)
Urobilinogen, UA: 0.2 mg/dL (ref 0.0–1.0)

## 2012-10-21 NOTE — MAU Provider Note (Signed)
History  Tonya Davidson 22 y.o. G0P0      Chief Complaint  Patient presents with  . Abdominal Pain       And N/V, per patient, not keeping Doxy down.   Past Medical History  Diagnosis Date  . IBS (irritable bowel syndrome)   . Bipolar 1 disorder   . Mononucleosis   . Kidney stones   . PID (acute pelvic inflammatory disease) 04/28/2012  . Chlamydia 04/29/2012  . C. difficile diarrhea   . Anemia   . Anxiety   . Arthritis   . Depression   . Ulcer   . Abnormal Pap smear     colposcopy  . HPV in female     Past Surgical History  Procedure Date  . Kidney stone surgery   . Tonsillectomy   . Inner ear surgery   . Wisdom tooth extraction   . Colposcopy     Family History  Problem Relation Age of Onset  . Bipolar disorder Mother   . Cancer Father   . ADD / ADHD Brother   . Cancer Maternal Grandmother   . Cancer Maternal Grandfather   . Cancer Paternal Grandmother   . Cancer Paternal Grandfather     History  Substance Use Topics  . Smoking status: Current Every Day Smoker -- 0.5 packs/day for 10 years    Types: Cigarettes  . Smokeless tobacco: Not on file  . Alcohol Use: No    Allergies:  Allergies  Allergen Reactions  . Azithromycin Other (See Comments)    GI upset and abdominal pain  . Prednisone     Impacts Bipolar symptoms  . Doxycycline Hyclate Nausea And Vomiting    Prescriptions prior to admission  Medication Sig Dispense Refill  . clonazePAM (KLONOPIN) 1 MG tablet Take 1 mg by mouth 2 (two) times daily as needed.      . docusate sodium (COLACE) 100 MG capsule Take 100 mg by mouth 2 (two) times daily.      Marland Kitchen doxycycline (VIBRAMYCIN) 50 MG capsule Take 2 capsules (100 mg total) by mouth 2 (two) times daily.  28 capsule  0  . fluconazole (DIFLUCAN) 200 MG tablet Take 1 tablet (200 mg total) by mouth daily.  7 tablet  0  . hydrocortisone (CORTENEMA) 100 MG/60ML enema Place 100 mg rectally at bedtime.      Marland Kitchen ibuprofen (ADVIL,MOTRIN) 200 MG tablet  Take 400-800 mg by mouth every 6 (six) hours as needed. For pain      . norgestimate-ethinyl estradiol (ORTHO-CYCLEN,SPRINTEC,PREVIFEM) 0.25-35 MG-MCG tablet Take 1 tablet by mouth daily.        . ondansetron (ZOFRAN) 4 MG tablet Take 1 tablet (4 mg total) by mouth every 8 (eight) hours as needed for nausea. In daytime  30 tablet  0  . oxyCODONE-acetaminophen (PERCOCET/ROXICET) 5-325 MG per tablet Take 2 tablets by mouth every 6 (six) hours as needed for pain.  15 tablet  0  . phenazopyridine (PYRIDIUM) 200 MG tablet Take 200 mg by mouth 2 (two) times daily as needed. For urinary pain      . promethazine (PHENERGAN) 25 MG tablet Take 1 tablet (25 mg total) by mouth every 8 (eight) hours as needed for nausea.  20 tablet  0  . zolpidem (AMBIEN) 10 MG tablet Take 10 mg by mouth at bedtime as needed. For sleep         Physical Exam   Blood pressure 108/72, pulse 102, temperature 99.3 F (37.4 C), temperature  source Oral, resp. rate 18, height 5\' 4"  (1.626 m), weight 54.885 kg (121 lb), last menstrual period 09/29/2012.  CBC WBC 9.8 wnl          Hb 11.9  U/A RBC pos        Nitrites neg        Bact. Rare Will send urine for culture  [facility] 10/18/2012 Gono/Chlam neg.                             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  Doubt PID for which STI screen was neg.  Rocephin IM received, currently on Doxy, recommended to d/c.  Afebrile.  WBC wnl.  D/C home.  Plan to refer patient to [facility] chronic pain clinic.   Patient informed of the findings and that a Urine Culture will be done.  Patient very annoyed of not getting an IV and not being admitted.  Came accompanied by a friend who assured that he could bring her back home safely.  Genia Del MD  10/21/2012 at 1:53 am

## 2012-10-21 NOTE — MAU Note (Signed)
Dr. Seymour Bars at bedside discussing lab results and discharge instructions. Patient visibly upset and yelling at Dr. Seymour Bars. After Dr. Seymour Bars left the room, I asked patient if she wanted to speak with hospital [facility] about her complaints. Patient states doesn't want to speak to anyone, just wants to leave. Dr. Seymour Bars put in discharge and patient left without signing e signature or taking discharge paperwork.

## 2012-10-21 NOTE — ED Provider Notes (Signed)
Medical screening examination/treatment/procedure(s) were performed by non-physician practitioner and as supervising physician I was immediately available for consultation/collaboration.  Olivia Mackie, MD 10/21/12 [phone]

## 2012-10-22 LAB — URINE CULTURE
Colony Count: NO GROWTH
Culture: NO GROWTH

## 2012-11-02 ENCOUNTER — Encounter (HOSPITAL_COMMUNITY): Payer: Self-pay | Admitting: *Deleted

## 2012-11-02 ENCOUNTER — Emergency Department (HOSPITAL_COMMUNITY)
Admission: EM | Admit: 2012-11-02 | Discharge: 2012-11-02 | Disposition: A | Discharge status: Home / Self Care | Payer: Self-pay | Attending: Emergency Medicine | Admitting: Emergency Medicine

## 2012-11-02 ENCOUNTER — Emergency Department (HOSPITAL_COMMUNITY): Payer: Self-pay

## 2012-11-02 DIAGNOSIS — Z8619 Personal history of other infectious and parasitic diseases: Secondary | ICD-10-CM | POA: Insufficient documentation

## 2012-11-02 DIAGNOSIS — K5900 Constipation, unspecified: Secondary | ICD-10-CM | POA: Insufficient documentation

## 2012-11-02 DIAGNOSIS — R112 Nausea with vomiting, unspecified: Secondary | ICD-10-CM | POA: Insufficient documentation

## 2012-11-02 DIAGNOSIS — Z8742 Personal history of other diseases of the female genital tract: Secondary | ICD-10-CM | POA: Insufficient documentation

## 2012-11-02 DIAGNOSIS — Z3202 Encounter for pregnancy test, result negative: Secondary | ICD-10-CM | POA: Insufficient documentation

## 2012-11-02 DIAGNOSIS — Z8739 Personal history of other diseases of the musculoskeletal system and connective tissue: Secondary | ICD-10-CM | POA: Insufficient documentation

## 2012-11-02 DIAGNOSIS — R109 Unspecified abdominal pain: Secondary | ICD-10-CM | POA: Insufficient documentation

## 2012-11-02 DIAGNOSIS — Z862 Personal history of diseases of the blood and blood-forming organs and certain disorders involving the immune mechanism: Secondary | ICD-10-CM | POA: Insufficient documentation

## 2012-11-02 DIAGNOSIS — Z872 Personal history of diseases of the skin and subcutaneous tissue: Secondary | ICD-10-CM | POA: Insufficient documentation

## 2012-11-02 DIAGNOSIS — F17200 Nicotine dependence, unspecified, uncomplicated: Secondary | ICD-10-CM | POA: Insufficient documentation

## 2012-11-02 DIAGNOSIS — Z87442 Personal history of urinary calculi: Secondary | ICD-10-CM | POA: Insufficient documentation

## 2012-11-02 DIAGNOSIS — Z8719 Personal history of other diseases of the digestive system: Secondary | ICD-10-CM | POA: Insufficient documentation

## 2012-11-02 DIAGNOSIS — Z79899 Other long term (current) drug therapy: Secondary | ICD-10-CM | POA: Insufficient documentation

## 2012-11-02 DIAGNOSIS — F411 Generalized anxiety disorder: Secondary | ICD-10-CM | POA: Insufficient documentation

## 2012-11-02 DIAGNOSIS — F319 Bipolar disorder, unspecified: Secondary | ICD-10-CM | POA: Insufficient documentation

## 2012-11-02 MED ORDER — POLYETHYLENE GLYCOL 3350 17 GM/SCOOP PO POWD: 17.0000 g | Freq: Every day | ORAL | Status: DC

## 2012-11-02 MED ORDER — ONDANSETRON 8 MG PO TBDP: 8.0000 mg | ORAL_TABLET | Freq: Three times a day (TID) | ORAL | Status: DC | PRN

## 2012-11-02 MED ORDER — SODIUM CHLORIDE 0.9 % IV SOLN
1000.0000 mL | Freq: Once | INTRAVENOUS | Status: AC
  Administered 2012-11-02: 1000 mL via INTRAVENOUS

## 2012-11-02 MED ORDER — KETOROLAC TROMETHAMINE 30 MG/ML IJ SOLN
30.0000 mg | Freq: Once | INTRAMUSCULAR | Status: DC
  Filled 2012-11-02: qty 1

## 2012-11-02 MED ORDER — ONDANSETRON HCL 4 MG/2ML IJ SOLN
4.0000 mg | Freq: Once | INTRAMUSCULAR | Status: AC
  Administered 2012-11-02: 4 mg via INTRAVENOUS
  Filled 2012-11-02: qty 2

## 2012-11-02 MED ORDER — SODIUM CHLORIDE 0.9 % IV SOLN
1000.0000 mL | INTRAVENOUS | Status: DC
  Administered 2012-11-02: 1000 mL via INTRAVENOUS

## 2012-11-02 NOTE — ED Notes (Signed)
Patient states she has not had a BM in 14-15 days. Patient states pain wakes her up in the middle of the night. Patient states that this morning she had a very small pebble like stool. Patient states that she has been taking Dulcolax at home. Dr Patria Mane at bedside. Patient also c/o N/V x2 weeks. Patient states she has had limited amount of PO intake, "I am scared I will throw up."

## 2012-11-02 NOTE — ED Provider Notes (Signed)
History     CSN: [id number]  Arrival date & time 11/02/12  6213   First MD Initiated Contact with Patient 11/02/12 [phone]      Chief Complaint  Patient presents with  . Constipation     The history is provided by the patient.   patient reports no bowel movement for the past 2 weeks.  She reports that she is having abdominal cramping with weightbearing the middle the night.  She has had some nausea and vomiting.  She denies abdominal distention.  No prior abdominal surgeries.  No fevers or chills.  Continues to feel to keep some fluids down.  No other recent sick contacts.  She recently was on Percocet for pain  Past Medical History  Diagnosis Date  . IBS (irritable bowel syndrome)   . Bipolar 1 disorder   . Mononucleosis   . Kidney stones   . PID (acute pelvic inflammatory disease) 04/28/2012  . Chlamydia 04/29/2012  . C. difficile diarrhea   . Anemia   . Anxiety   . Arthritis   . Depression   . Ulcer   . Abnormal Pap smear     colposcopy  . HPV in female     Past Surgical History  Procedure Laterality Date  . Kidney stone surgery    . Tonsillectomy    . Inner ear surgery    . Wisdom tooth extraction    . Colposcopy      Family History  Problem Relation Age of Onset  . Bipolar disorder Mother   . Cancer Father   . ADD / ADHD Brother   . Cancer Maternal Grandmother   . Cancer Maternal Grandfather   . Cancer Paternal Grandmother   . Cancer Paternal Grandfather     History  Substance Use Topics  . Smoking status: Current Every Day Smoker -- 0.50 packs/day for 10 years    Types: Cigarettes  . Smokeless tobacco: Not on file  . Alcohol Use: No    OB History   Grav Para Term Preterm Abortions TAB SAB Ect Mult Living   0               Review of Systems  Gastrointestinal: Positive for constipation.  All other systems reviewed and are negative.    Allergies  Azithromycin; Prednisone; and Doxycycline hyclate  Home Medications   Current Outpatient Rx   Name  Route  Sig  Dispense  Refill  . clonazePAM (KLONOPIN) 1 MG tablet   Oral   Take 1 mg by mouth 2 (two) times daily as needed. For anxiety         . docusate sodium (COLACE) 100 MG capsule   Oral   Take 100 mg by mouth 2 (two) times daily.         Marland Kitchen ibuprofen (ADVIL,MOTRIN) 200 MG tablet   Oral   Take 400-800 mg by mouth every 6 (six) hours as needed. For pain         . norgestimate-ethinyl estradiol (ORTHO-CYCLEN,SPRINTEC,PREVIFEM) 0.25-35 MG-MCG tablet   Oral   Take 1 tablet by mouth daily.           . ondansetron (ZOFRAN) 4 MG tablet   Oral   Take 1 tablet (4 mg total) by mouth every 8 (eight) hours as needed for nausea. In daytime   30 tablet   0   . Probiotic Product (PROBIOTIC DAILY PO)   Oral   Take 1 capsule by mouth daily.         Marland Kitchen  promethazine (PHENERGAN) 25 MG tablet   Oral   Take 1 tablet (25 mg total) by mouth every 8 (eight) hours as needed for nausea.   20 tablet   0   . ondansetron (ZOFRAN ODT) 8 MG disintegrating tablet   Oral   Take 1 tablet (8 mg total) by mouth every 8 (eight) hours as needed for nausea.   10 tablet   0   . polyethylene glycol powder (MIRALAX) powder   Oral   Take 17 g by mouth daily.   255 g   0   . zolpidem (AMBIEN) 10 MG tablet   Oral   Take 10 mg by mouth at bedtime as needed. For sleep           BP 98/62  Pulse 82  Temp(Src) 98.3 F (36.8 C) (Oral)  Resp 16  SpO2 100%  LMP 10/16/2012  Physical Exam  Nursing note and vitals reviewed. Constitutional: She is oriented to person, place, and time. She appears well-developed and well-nourished. No distress.  HENT:  Head: Normocephalic and atraumatic.  Eyes: EOM are normal.  Neck: Normal range of motion.  Cardiovascular: Normal rate, regular rhythm and normal heart sounds.   Pulmonary/Chest: Effort normal and breath sounds normal.  Abdominal: Soft. She exhibits no distension. There is no tenderness.  Genitourinary:  Normal stool color.  No fecal  impaction.  Nontender rectal exam  Musculoskeletal: Normal range of motion.  Neurological: She is alert and oriented to person, place, and time.  Skin: Skin is warm and dry.  Psychiatric: She has a normal mood and affect. Judgment normal.    ED Course  Procedures (including critical care time)  Labs Reviewed  PREGNANCY, URINE   Dg Abd 2 Views  11/02/2012  *RADIOLOGY REPORT*  Clinical Data: Abdominal pain, nausea, vomiting and constipation.  ABDOMEN - 2 VIEW  Comparison: Abdominal radiograph performed 01/26/2012, and CT of the abdomen and pelvis performed 05/18/2012  Findings: The visualized bowel gas pattern is unremarkable. Scattered air and stool filled loops of colon are seen; no abnormal dilatation of small bowel loops is seen to suggest small bowel obstruction.  No free intra-abdominal air is identified, though evaluation for free air is limited on a single supine view.  The visualized osseous structures are within normal limits; the sacroiliac joints are unremarkable in appearance.  The visualized lung bases are essentially clear.  Bilateral metallic nipple piercings are seen.  The liver appears mildly enlarged, measuring 21.5 cm in length.  IMPRESSION:  1.  Unremarkable bowel gas pattern; no free intra-abdominal air seen.  Small to moderate of stool noted within the colon, without definite radiographic evidence for constipation. 2.  Mildly hepatomegaly.   Original Report Authenticated By: Tonia Ghent, M.D.    I personally reviewed the imaging tests through PACS system I reviewed available ER/hospitalization records through the EMR   1. Constipation       MDM  Patient clinically has constipation.  Home with MiraLAX.  Abdomen is benign on exam.  Discharge home in good condition.  Vital signs normal.        Lyanne Co, MD 11/02/12 [phone]

## 2012-11-02 NOTE — ED Notes (Signed)
Pt states she has not had BM x 14-15 days. Pt states pain is waking her in the middle of the night. Pt c/o vomiting daily more than once a day x 2 weeks.

## 2013-01-04 ENCOUNTER — Emergency Department (HOSPITAL_COMMUNITY): Payer: Managed Care, Other (non HMO)

## 2013-01-04 ENCOUNTER — Emergency Department (HOSPITAL_COMMUNITY)
Admission: EM | Admit: 2013-01-04 | Discharge: 2013-01-04 | Disposition: A | Discharge status: Home / Self Care | Payer: Managed Care, Other (non HMO) | Attending: Emergency Medicine | Admitting: Emergency Medicine

## 2013-01-04 ENCOUNTER — Encounter (HOSPITAL_COMMUNITY): Payer: Self-pay | Admitting: Emergency Medicine

## 2013-01-04 DIAGNOSIS — Z8719 Personal history of other diseases of the digestive system: Secondary | ICD-10-CM | POA: Insufficient documentation

## 2013-01-04 DIAGNOSIS — F3289 Other specified depressive episodes: Secondary | ICD-10-CM | POA: Insufficient documentation

## 2013-01-04 DIAGNOSIS — F411 Generalized anxiety disorder: Secondary | ICD-10-CM | POA: Insufficient documentation

## 2013-01-04 DIAGNOSIS — Z79899 Other long term (current) drug therapy: Secondary | ICD-10-CM | POA: Insufficient documentation

## 2013-01-04 DIAGNOSIS — Z862 Personal history of diseases of the blood and blood-forming organs and certain disorders involving the immune mechanism: Secondary | ICD-10-CM | POA: Insufficient documentation

## 2013-01-04 DIAGNOSIS — Z9889 Other specified postprocedural states: Secondary | ICD-10-CM | POA: Insufficient documentation

## 2013-01-04 DIAGNOSIS — Z87442 Personal history of urinary calculi: Secondary | ICD-10-CM | POA: Insufficient documentation

## 2013-01-04 DIAGNOSIS — Z8739 Personal history of other diseases of the musculoskeletal system and connective tissue: Secondary | ICD-10-CM | POA: Insufficient documentation

## 2013-01-04 DIAGNOSIS — Z8742 Personal history of other diseases of the female genital tract: Secondary | ICD-10-CM | POA: Insufficient documentation

## 2013-01-04 DIAGNOSIS — F319 Bipolar disorder, unspecified: Secondary | ICD-10-CM | POA: Insufficient documentation

## 2013-01-04 DIAGNOSIS — F329 Major depressive disorder, single episode, unspecified: Secondary | ICD-10-CM | POA: Insufficient documentation

## 2013-01-04 DIAGNOSIS — Z872 Personal history of diseases of the skin and subcutaneous tissue: Secondary | ICD-10-CM | POA: Insufficient documentation

## 2013-01-04 DIAGNOSIS — N390 Urinary tract infection, site not specified: Secondary | ICD-10-CM | POA: Insufficient documentation

## 2013-01-04 DIAGNOSIS — Z3202 Encounter for pregnancy test, result negative: Secondary | ICD-10-CM | POA: Insufficient documentation

## 2013-01-04 DIAGNOSIS — F17200 Nicotine dependence, unspecified, uncomplicated: Secondary | ICD-10-CM | POA: Insufficient documentation

## 2013-01-04 DIAGNOSIS — Z8619 Personal history of other infectious and parasitic diseases: Secondary | ICD-10-CM | POA: Insufficient documentation

## 2013-01-04 LAB — URINALYSIS, ROUTINE W REFLEX MICROSCOPIC
Bilirubin Urine: NEGATIVE
Glucose, UA: NEGATIVE mg/dL
Ketones, ur: NEGATIVE mg/dL
Protein, ur: NEGATIVE mg/dL
pH: 8 (ref 5.0–8.0)

## 2013-01-04 LAB — URINE MICROSCOPIC-ADD ON

## 2013-01-04 MED ORDER — NITROFURANTOIN MONOHYD MACRO 100 MG PO CAPS: 100.0000 mg | ORAL_CAPSULE | Freq: Two times a day (BID) | ORAL | Status: DC

## 2013-01-04 MED ORDER — MORPHINE SULFATE 4 MG/ML IJ SOLN
4.0000 mg | Freq: Once | INTRAMUSCULAR | Status: AC
  Administered 2013-01-04: 4 mg via INTRAVENOUS
  Filled 2013-01-04: qty 1

## 2013-01-04 MED ORDER — DEXTROSE 5 % IV SOLN
1.0000 g | Freq: Once | INTRAVENOUS | Status: AC
  Administered 2013-01-04: 1 g via INTRAVENOUS
  Filled 2013-01-04: qty 10

## 2013-01-04 MED ORDER — TRAMADOL HCL 50 MG PO TABS: 50.0000 mg | ORAL_TABLET | Freq: Four times a day (QID) | ORAL | Status: DC | PRN

## 2013-01-04 MED ORDER — ONDANSETRON HCL 4 MG/2ML IJ SOLN
4.0000 mg | Freq: Once | INTRAMUSCULAR | Status: AC
  Administered 2013-01-04: 4 mg via INTRAVENOUS
  Filled 2013-01-04: qty 2

## 2013-01-04 MED ORDER — SODIUM CHLORIDE 0.9 % IV BOLUS (SEPSIS)
1000.0000 mL | Freq: Once | INTRAVENOUS | Status: AC
  Administered 2013-01-04: 1000 mL via INTRAVENOUS

## 2013-01-04 NOTE — ED Provider Notes (Signed)
History     CSN: [id number]  Arrival date & time 01/04/13  0026   First MD Initiated Contact with Patient 01/04/13 0214      Chief Complaint  Patient presents with  . Dysuria    (Consider location/radiation/quality/duration/timing/severity/associated sxs/prior treatment) HPI... dysuria, urgency, right flank pain for several days. Patient has a history of kidney stones and urinary tract infections.  No fever or chills. She has not been sexually active for several months. Has appointment with the urologist on Tuesday.  Severity is moderate.  Nothing makes symptoms better or worse  Past Medical History  Diagnosis Date  . IBS (irritable bowel syndrome)   . Bipolar 1 disorder   . Mononucleosis   . Kidney stones   . PID (acute pelvic inflammatory disease) 04/28/2012  . Chlamydia 04/29/2012  . C. difficile diarrhea   . Anemia   . Anxiety   . Arthritis   . Depression   . Ulcer   . Abnormal Pap smear     colposcopy  . HPV in female     Past Surgical History  Procedure Laterality Date  . Kidney stone surgery    . Tonsillectomy    . Inner ear surgery    . Wisdom tooth extraction    . Colposcopy      Family History  Problem Relation Age of Onset  . Bipolar disorder Mother   . Cancer Father   . ADD / ADHD Brother   . Cancer Maternal Grandmother   . Cancer Maternal Grandfather   . Cancer Paternal Grandmother   . Cancer Paternal Grandfather     History  Substance Use Topics  . Smoking status: Current Every Day Smoker -- 0.50 packs/day for 10 years    Types: Cigarettes  . Smokeless tobacco: Not on file  . Alcohol Use: No    OB History   Grav Para Term Preterm Abortions TAB SAB Ect Mult Living   0               Review of Systems  All other systems reviewed and are negative.    Allergies  Azithromycin; Prednisone; Bactrim; and Doxycycline hyclate  Home Medications   Current Outpatient Rx  Name  Route  Sig  Dispense  Refill  . bisacodyl (DULCOLAX) 5 MG EC  tablet   Oral   Take 15 mg by mouth daily as needed for constipation.         . clonazePAM (KLONOPIN) 1 MG tablet   Oral   Take 1 mg by mouth at bedtime. For anxiety         . norgestimate-ethinyl estradiol (ORTHO-CYCLEN,SPRINTEC,PREVIFEM) 0.25-35 MG-MCG tablet   Oral   Take 1 tablet by mouth daily.           . promethazine (PHENERGAN) 25 MG tablet   Oral   Take 1 tablet (25 mg total) by mouth every 8 (eight) hours as needed for nausea.   20 tablet   0   . zolpidem (AMBIEN) 10 MG tablet   Oral   Take 10 mg by mouth at bedtime as needed for sleep. For sleep         . nitrofurantoin, macrocrystal-monohydrate, (MACROBID) 100 MG capsule   Oral   Take 1 capsule (100 mg total) by mouth 2 (two) times daily. X 7 days   14 capsule   0   . traMADol (ULTRAM) 50 MG tablet   Oral   Take 1 tablet (50 mg total) by mouth every  6 (six) hours as needed for pain.   20 tablet   0     BP 99/58  Pulse 92  Temp(Src) 98.7 F (37.1 C) (Oral)  Resp 18  Ht 5\' 4"  (1.626 m)  Wt 105 lb 6.4 oz (47.809 kg)  BMI 18.08 kg/m2  SpO2 100%  LMP 12/21/2012  Physical Exam  Nursing note and vitals reviewed. Constitutional: She is oriented to person, place, and time. She appears well-developed and well-nourished.  HENT:  Head: Normocephalic and atraumatic.  Eyes: Conjunctivae and EOM are normal. Pupils are equal, round, and reactive to light.  Neck: Normal range of motion. Neck supple.  Cardiovascular: Normal rate, regular rhythm and normal heart sounds.   Pulmonary/Chest: Effort normal and breath sounds normal.  Abdominal: Soft. Bowel sounds are normal.  Suprapubic tenderness  Genitourinary:  Slight right flank tenderness  Musculoskeletal: Normal range of motion.  Neurological: She is alert and oriented to person, place, and time.  Skin: Skin is warm and dry.  Psychiatric: She has a normal mood and affect.    ED Course  Procedures (including critical care time)  Labs Reviewed   URINALYSIS, ROUTINE W REFLEX MICROSCOPIC - Abnormal; Notable for the following:    APPearance CLOUDY (*)    Hgb urine dipstick LARGE (*)    Leukocytes, UA SMALL (*)    All other components within normal limits  URINE MICROSCOPIC-ADD ON - Abnormal; Notable for the following:    Squamous Epithelial / LPF MANY (*)    Bacteria, UA MANY (*)    All other components within normal limits  URINE CULTURE  PREGNANCY, URINE   Dg Abd 1 View  01/04/2013  *RADIOLOGY REPORT*  Clinical Data: Flank pain and hematuria.  ABDOMEN - 1 VIEW  Comparison: Abdominal radiograph performed 11/02/2012, and renal ultrasound performed 06/18/2012; CT of the abdomen and pelvis from 05/18/2012  Findings: The visualized bowel gas pattern is unremarkable. Scattered air and stool filled loops of colon are seen; no abnormal dilatation of small bowel loops is seen to suggest small bowel obstruction.  No free intra-abdominal air is identified, though evaluation for free air is limited on a single supine view.  No renal or ureteral stones are identified, though evaluation is limited given overlying stool.  The visualized osseous structures are within normal limits; the sacroiliac joints are unremarkable in appearance.  A metallic piercing is noted at the umbilicus.  IMPRESSION:  1.  Unremarkable bowel gas pattern; no free intra-abdominal air seen. 2.  No renal or ureteral stones identified, though evaluation is limited given overlying stool.  No renal stones were identified on the most recent renal ultrasound from October.   Original Report Authenticated By: Tonia Ghent, M.D.      1. Urinary tract infection       MDM  Patient has minor urinary infection. Feels better after IV hydration and pain management. Discharge meds Macrobid for one week and tramadol#20.  Patient has urological followup        Donnetta Hutching, MD 01/04/13 [phone]

## 2013-01-04 NOTE — ED Notes (Signed)
Pt c/o pain with urination onset March 1. Diarrhea x 1 week. Pt states she had syncopal episode early this morning then again last night.

## 2013-01-05 LAB — URINE CULTURE: Colony Count: 8000

## 2013-04-07 ENCOUNTER — Emergency Department (HOSPITAL_COMMUNITY): Payer: Managed Care, Other (non HMO)

## 2013-04-07 ENCOUNTER — Emergency Department (HOSPITAL_COMMUNITY)
Admission: EM | Admit: 2013-04-07 | Discharge: 2013-04-07 | Disposition: A | Discharge status: Home / Self Care | Payer: Managed Care, Other (non HMO) | Attending: Emergency Medicine | Admitting: Emergency Medicine

## 2013-04-07 ENCOUNTER — Encounter (HOSPITAL_COMMUNITY): Payer: Self-pay | Admitting: Emergency Medicine

## 2013-04-07 DIAGNOSIS — R109 Unspecified abdominal pain: Secondary | ICD-10-CM | POA: Insufficient documentation

## 2013-04-07 DIAGNOSIS — Z8619 Personal history of other infectious and parasitic diseases: Secondary | ICD-10-CM | POA: Insufficient documentation

## 2013-04-07 DIAGNOSIS — R112 Nausea with vomiting, unspecified: Secondary | ICD-10-CM | POA: Insufficient documentation

## 2013-04-07 DIAGNOSIS — Z3202 Encounter for pregnancy test, result negative: Secondary | ICD-10-CM | POA: Insufficient documentation

## 2013-04-07 DIAGNOSIS — R197 Diarrhea, unspecified: Secondary | ICD-10-CM | POA: Insufficient documentation

## 2013-04-07 DIAGNOSIS — Z79899 Other long term (current) drug therapy: Secondary | ICD-10-CM | POA: Insufficient documentation

## 2013-04-07 DIAGNOSIS — Z8719 Personal history of other diseases of the digestive system: Secondary | ICD-10-CM | POA: Insufficient documentation

## 2013-04-07 DIAGNOSIS — N12 Tubulo-interstitial nephritis, not specified as acute or chronic: Secondary | ICD-10-CM | POA: Insufficient documentation

## 2013-04-07 DIAGNOSIS — R300 Dysuria: Secondary | ICD-10-CM | POA: Insufficient documentation

## 2013-04-07 DIAGNOSIS — Z9889 Other specified postprocedural states: Secondary | ICD-10-CM | POA: Insufficient documentation

## 2013-04-07 DIAGNOSIS — F17200 Nicotine dependence, unspecified, uncomplicated: Secondary | ICD-10-CM | POA: Insufficient documentation

## 2013-04-07 DIAGNOSIS — R509 Fever, unspecified: Secondary | ICD-10-CM | POA: Insufficient documentation

## 2013-04-07 DIAGNOSIS — Z8739 Personal history of other diseases of the musculoskeletal system and connective tissue: Secondary | ICD-10-CM | POA: Insufficient documentation

## 2013-04-07 DIAGNOSIS — Z8742 Personal history of other diseases of the female genital tract: Secondary | ICD-10-CM | POA: Insufficient documentation

## 2013-04-07 DIAGNOSIS — Z87442 Personal history of urinary calculi: Secondary | ICD-10-CM | POA: Insufficient documentation

## 2013-04-07 DIAGNOSIS — Z862 Personal history of diseases of the blood and blood-forming organs and certain disorders involving the immune mechanism: Secondary | ICD-10-CM | POA: Insufficient documentation

## 2013-04-07 DIAGNOSIS — F319 Bipolar disorder, unspecified: Secondary | ICD-10-CM | POA: Insufficient documentation

## 2013-04-07 DIAGNOSIS — F411 Generalized anxiety disorder: Secondary | ICD-10-CM | POA: Insufficient documentation

## 2013-04-07 DIAGNOSIS — M549 Dorsalgia, unspecified: Secondary | ICD-10-CM | POA: Insufficient documentation

## 2013-04-07 LAB — COMPREHENSIVE METABOLIC PANEL
Albumin: 3.3 g/dL — ABNORMAL LOW (ref 3.5–5.2)
BUN: 11 mg/dL (ref 6–23)
Calcium: 9.2 mg/dL (ref 8.4–10.5)
Creatinine, Ser: 0.73 mg/dL (ref 0.50–1.10)
GFR calc Af Amer: >90 mL/min (ref >90)
Glucose, Bld: 110 mg/dL — ABNORMAL HIGH (ref 70–99)
Total Protein: 6.9 g/dL (ref 6.0–8.3)

## 2013-04-07 LAB — CBC
HCT: 38.3 % (ref 36.0–46.0)
MCH: 31.2 pg (ref 26.0–34.0)
MCHC: 33.4 g/dL (ref 30.0–36.0)
MCV: 93.4 fL (ref 78.0–100.0)
RDW: 11.9 % (ref 11.5–15.5)

## 2013-04-07 LAB — POCT PREGNANCY, URINE: Preg Test, Ur: NEGATIVE

## 2013-04-07 LAB — URINALYSIS, ROUTINE W REFLEX MICROSCOPIC
Bilirubin Urine: NEGATIVE
Glucose, UA: NEGATIVE mg/dL
Ketones, ur: NEGATIVE mg/dL
Protein, ur: 100 mg/dL — AB

## 2013-04-07 LAB — URINE MICROSCOPIC-ADD ON

## 2013-04-07 MED ORDER — SODIUM CHLORIDE 0.9 % IV BOLUS (SEPSIS)
1000.0000 mL | Freq: Once | INTRAVENOUS | Status: AC
  Administered 2013-04-07: 1000 mL via INTRAVENOUS

## 2013-04-07 MED ORDER — ONDANSETRON HCL 4 MG/2ML IJ SOLN
4.0000 mg | Freq: Once | INTRAMUSCULAR | Status: AC
  Administered 2013-04-07: 4 mg via INTRAVENOUS
  Filled 2013-04-07: qty 2

## 2013-04-07 MED ORDER — HYDROMORPHONE HCL PF 1 MG/ML IJ SOLN
1.0000 mg | Freq: Once | INTRAMUSCULAR | Status: AC
  Administered 2013-04-07: 1 mg via INTRAVENOUS
  Filled 2013-04-07: qty 1

## 2013-04-07 MED ORDER — CIPROFLOXACIN HCL 500 MG PO TABS: 500.0000 mg | ORAL_TABLET | Freq: Two times a day (BID) | ORAL | Status: DC

## 2013-04-07 MED ORDER — PROMETHAZINE HCL 25 MG/ML IJ SOLN
25.0000 mg | Freq: Once | INTRAMUSCULAR | Status: AC
  Administered 2013-04-07: 25 mg via INTRAVENOUS
  Filled 2013-04-07: qty 1

## 2013-04-07 MED ORDER — KETOROLAC TROMETHAMINE 30 MG/ML IJ SOLN
30.0000 mg | Freq: Once | INTRAMUSCULAR | Status: AC
  Administered 2013-04-07: 30 mg via INTRAVENOUS
  Filled 2013-04-07: qty 1

## 2013-04-07 MED ORDER — OXYCODONE-ACETAMINOPHEN 5-325 MG PO TABS: 2.0000 | ORAL_TABLET | ORAL | Status: DC | PRN

## 2013-04-07 MED ORDER — MORPHINE SULFATE 4 MG/ML IJ SOLN
4.0000 mg | Freq: Once | INTRAMUSCULAR | Status: AC
  Administered 2013-04-07: 4 mg via INTRAVENOUS
  Filled 2013-04-07: qty 1

## 2013-04-07 MED ORDER — CIPROFLOXACIN IN D5W 400 MG/200ML IV SOLN
400.0000 mg | Freq: Once | INTRAVENOUS | Status: AC
  Administered 2013-04-07: 400 mg via INTRAVENOUS
  Filled 2013-04-07: qty 200

## 2013-04-07 NOTE — ED Notes (Signed)
Pt states that she began having blood in her urine 4-5 days ago. Also c/o intermittent fever, painful urination, n/v, diarrhea, & back pain. Pt is obvious distress.

## 2013-04-07 NOTE — ED Provider Notes (Signed)
History    CSN: [id number] Arrival date & time 04/07/13  0542  None    Chief Complaint  Patient presents with  . Hematuria   (Consider location/radiation/quality/duration/timing/severity/associated sxs/prior Treatment) HPI Comments: Patient is a 22 year old female with a past medical history of bipolar 1 disorder, kidney stones, and IBS who presents with a 5 day history of flank pain. The pain is located in her bilateral flanks and does not radiate. The pain is described as sharp and severe. The pain started gradually and progressively worsened since the onset. No alleviating/aggravating factors. The patient has tried nothing for symptoms without relief. Associated symptoms include NVD, fever of 102 at home, and dysuria. Patient denies fever, headache, chest pain, SOB, constipation, abnormal vaginal bleeding/discharge. LMP was 3 weeks ago.     Past Medical History  Diagnosis Date  . IBS (irritable bowel syndrome)   . Bipolar 1 disorder   . Mononucleosis   . Kidney stones   . PID (acute pelvic inflammatory disease) 04/28/2012  . Chlamydia 04/29/2012  . C. difficile diarrhea   . Anemia   . Anxiety   . Arthritis   . Depression   . Ulcer   . Abnormal Pap smear     colposcopy  . HPV in female    Past Surgical History  Procedure Laterality Date  . Kidney stone surgery    . Tonsillectomy    . Inner ear surgery    . Wisdom tooth extraction    . Colposcopy     Family History  Problem Relation Age of Onset  . Bipolar disorder Mother   . Cancer Father   . ADD / ADHD Brother   . Cancer Maternal Grandmother   . Cancer Maternal Grandfather   . Cancer Paternal Grandmother   . Cancer Paternal Grandfather    History  Substance Use Topics  . Smoking status: Current Every Day Smoker -- 0.50 packs/day for 10 years    Types: Cigarettes  . Smokeless tobacco: Never Used  . Alcohol Use: No   OB History   Grav Para Term Preterm Abortions TAB SAB Ect Mult Living   0               Review of Systems  Constitutional: Positive for fever.  Gastrointestinal: Positive for nausea, vomiting and diarrhea.  Genitourinary: Positive for dysuria and flank pain.  Musculoskeletal: Positive for back pain.  All other systems reviewed and are negative.    Allergies  Azithromycin; Prednisone; Bactrim; and Doxycycline hyclate  Home Medications   Current Outpatient Rx  Name  Route  Sig  Dispense  Refill  . bisacodyl (DULCOLAX) 5 MG EC tablet   Oral   Take 15 mg by mouth daily as needed for constipation.         . clonazePAM (KLONOPIN) 1 MG tablet   Oral   Take 1 mg by mouth at bedtime. For anxiety         . nitrofurantoin, macrocrystal-monohydrate, (MACROBID) 100 MG capsule   Oral   Take 1 capsule (100 mg total) by mouth 2 (two) times daily. X 7 days   14 capsule   0   . norgestimate-ethinyl estradiol (ORTHO-CYCLEN,SPRINTEC,PREVIFEM) 0.25-35 MG-MCG tablet   Oral   Take 1 tablet by mouth daily.           . promethazine (PHENERGAN) 25 MG tablet   Oral   Take 1 tablet (25 mg total) by mouth every 8 (eight) hours as needed for nausea.  20 tablet   0   . traMADol (ULTRAM) 50 MG tablet   Oral   Take 1 tablet (50 mg total) by mouth every 6 (six) hours as needed for pain.   20 tablet   0   . zolpidem (AMBIEN) 10 MG tablet   Oral   Take 10 mg by mouth at bedtime as needed for sleep. For sleep          BP 117/71  Pulse 132  Temp(Src) 97.9 F (36.6 C) (Oral)  Resp 26  SpO2 99%  LMP 03/19/2013 Physical Exam  Nursing note and vitals reviewed. Constitutional: She is oriented to person, place, and time. She appears well-developed and well-nourished. No distress.  HENT:  Head: Normocephalic and atraumatic.  Eyes: Conjunctivae and EOM are normal.  Neck: Normal range of motion.  Cardiovascular: Normal rate and regular rhythm.  Exam reveals no gallop and no friction rub.   No murmur heard. Pulmonary/Chest: Effort normal and breath sounds normal. She has  no wheezes. She has no rales. She exhibits no tenderness.  Abdominal: Soft. She exhibits no distension. There is no tenderness. There is no rebound and no guarding.  Genitourinary:  Bilateral CVA tenderness.   Musculoskeletal: Normal range of motion.  Neurological: She is alert and oriented to person, place, and time. Coordination normal.  Speech is goal-oriented. Moves limbs without ataxia.   Skin: Skin is warm and dry.  Psychiatric: She has a normal mood and affect. Her behavior is normal.    ED Course  Procedures (including critical care time)   Date: 04/07/2013  Rate: 113  Rhythm: sinus tachycardia  QRS Axis: normal  Intervals: normal  ST/T Wave abnormalities: normal  Conduction Disutrbances:none  Narrative Interpretation: sinus tachycardia unchanged from previous  Old EKG Reviewed: none available    Labs Reviewed  URINALYSIS, ROUTINE W REFLEX MICROSCOPIC - Abnormal; Notable for the following:    Color, Urine AMBER (*)    APPearance TURBID (*)    Hgb urine dipstick LARGE (*)    Protein, ur 100 (*)    Nitrite POSITIVE (*)    Leukocytes, UA LARGE (*)    All other components within normal limits  COMPREHENSIVE METABOLIC PANEL - Abnormal; Notable for the following:    Glucose, Bld 110 (*)    Albumin 3.3 (*)    All other components within normal limits  URINE MICROSCOPIC-ADD ON - Abnormal; Notable for the following:    Bacteria, UA FEW (*)    All other components within normal limits  URINE CULTURE  CBC  POCT PREGNANCY, URINE   Ct Abdomen Pelvis Wo Contrast  04/07/2013   *RADIOLOGY REPORT*  Clinical Data: Flank pain and hematuria.  CT ABDOMEN AND PELVIS WITHOUT CONTRAST  Technique:  Multidetector CT imaging of the abdomen and pelvis was performed following the standard protocol without intravenous contrast.  Comparison: No priors.  Findings:  Lung Bases: Unremarkable.  Abdomen/Pelvis:  In the lower pole collecting system of the left kidney there is a tiny 2 mm  nonobstructive calculus.  No additional calculi are identified within the right renal collecting system, along the course of either ureter, or within the lumen of the urinary bladder.  No hydroureteronephrosis or perinephric stranding to suggest urinary tract obstruction at this time.  The unenhanced appearance of the kidneys is otherwise unremarkable bilaterally.  The unenhanced appearance of the liver, gallbladder, pancreas, spleen and bilateral adrenal glands is unremarkable.  No significant volume of ascites.  No pneumoperitoneum.  No pathologic distension  of small bowel.  No definite pathologic lymphadenopathy identified within the abdomen or pelvis.  The uterus and bilateral ovaries are unremarkable.  Urinary bladder is normal in appearance.  Musculoskeletal: There are no aggressive appearing lytic or blastic lesions noted in the visualized portions of the skeleton.  IMPRESSION: 1.  2 mm nonobstructive calculus in the lower pole collecting system of the left kidney. 2.  No ureteral stones or findings of urinary tract obstruction at this time. 3.  No other acute findings in the abdomen or pelvis to account for the patient's symptoms.   Original Report Authenticated By: Trudie Reed, M.D.   1. Pyelonephritis     MDM  6:08 AM Labs pending. Patient will have fluids and morphine. Patient is tachycardic with other stable vitals.   7:02 AM Labs unremarkable. Urinalysis shows hemoglobin and a UTI. Patient will have a non contrast CT scan to rule out kidney stone.   9:02 AM Patient's CT scan show small kidney stone in right kidney and no uretal stone. Patient will have IV Cipro for UTI and pain medication. Patient will be discharged with Cipro PO and pain medication to take at home.     Emilia Beck, PA-C 04/07/13 1252

## 2013-04-09 LAB — URINE CULTURE

## 2013-04-09 NOTE — ED Provider Notes (Signed)
History/physical exam/procedure(s) were performed by non-physician practitioner and as supervising physician I was immediately available for consultation/collaboration. I have reviewed all notes and am in agreement with care and plan.   Treavor Blomquist S Lainey Nelson, MD 04/09/13 1225 

## 2013-04-28 ENCOUNTER — Emergency Department (HOSPITAL_COMMUNITY): Payer: Managed Care, Other (non HMO)

## 2013-04-28 ENCOUNTER — Emergency Department (HOSPITAL_COMMUNITY)
Admission: EM | Admit: 2013-04-28 | Discharge: 2013-04-28 | Disposition: A | Discharge status: Home / Self Care | Payer: Managed Care, Other (non HMO) | Attending: Emergency Medicine | Admitting: Emergency Medicine

## 2013-04-28 ENCOUNTER — Encounter (HOSPITAL_COMMUNITY): Payer: Self-pay | Admitting: *Deleted

## 2013-04-28 DIAGNOSIS — Z8619 Personal history of other infectious and parasitic diseases: Secondary | ICD-10-CM | POA: Insufficient documentation

## 2013-04-28 DIAGNOSIS — Z87448 Personal history of other diseases of urinary system: Secondary | ICD-10-CM | POA: Insufficient documentation

## 2013-04-28 DIAGNOSIS — IMO0002 Reserved for concepts with insufficient information to code with codable children: Secondary | ICD-10-CM | POA: Insufficient documentation

## 2013-04-28 DIAGNOSIS — F411 Generalized anxiety disorder: Secondary | ICD-10-CM | POA: Insufficient documentation

## 2013-04-28 DIAGNOSIS — F319 Bipolar disorder, unspecified: Secondary | ICD-10-CM | POA: Insufficient documentation

## 2013-04-28 DIAGNOSIS — Z3202 Encounter for pregnancy test, result negative: Secondary | ICD-10-CM | POA: Insufficient documentation

## 2013-04-28 DIAGNOSIS — R197 Diarrhea, unspecified: Secondary | ICD-10-CM | POA: Insufficient documentation

## 2013-04-28 DIAGNOSIS — M549 Dorsalgia, unspecified: Secondary | ICD-10-CM | POA: Insufficient documentation

## 2013-04-28 DIAGNOSIS — R109 Unspecified abdominal pain: Secondary | ICD-10-CM | POA: Insufficient documentation

## 2013-04-28 DIAGNOSIS — Z8742 Personal history of other diseases of the female genital tract: Secondary | ICD-10-CM | POA: Insufficient documentation

## 2013-04-28 DIAGNOSIS — Z91040 Latex allergy status: Secondary | ICD-10-CM | POA: Insufficient documentation

## 2013-04-28 DIAGNOSIS — R300 Dysuria: Secondary | ICD-10-CM | POA: Insufficient documentation

## 2013-04-28 DIAGNOSIS — R112 Nausea with vomiting, unspecified: Secondary | ICD-10-CM | POA: Insufficient documentation

## 2013-04-28 DIAGNOSIS — F17200 Nicotine dependence, unspecified, uncomplicated: Secondary | ICD-10-CM | POA: Insufficient documentation

## 2013-04-28 DIAGNOSIS — Z79899 Other long term (current) drug therapy: Secondary | ICD-10-CM | POA: Insufficient documentation

## 2013-04-28 DIAGNOSIS — R319 Hematuria, unspecified: Secondary | ICD-10-CM | POA: Insufficient documentation

## 2013-04-28 DIAGNOSIS — M129 Arthropathy, unspecified: Secondary | ICD-10-CM | POA: Insufficient documentation

## 2013-04-28 DIAGNOSIS — Z872 Personal history of diseases of the skin and subcutaneous tissue: Secondary | ICD-10-CM | POA: Insufficient documentation

## 2013-04-28 DIAGNOSIS — Z87442 Personal history of urinary calculi: Secondary | ICD-10-CM | POA: Insufficient documentation

## 2013-04-28 HISTORY — DX: Fibromyalgia: M79.7

## 2013-04-28 LAB — COMPREHENSIVE METABOLIC PANEL
AST: 19 U/L (ref 0–37)
Albumin: 3.5 g/dL (ref 3.5–5.2)
Calcium: 8.9 mg/dL (ref 8.4–10.5)
Chloride: 102 mEq/L (ref 96–112)
Creatinine, Ser: 0.62 mg/dL (ref 0.50–1.10)
Sodium: 138 mEq/L (ref 135–145)
Total Bilirubin: 0.5 mg/dL (ref 0.3–1.2)

## 2013-04-28 LAB — URINALYSIS, ROUTINE W REFLEX MICROSCOPIC
Glucose, UA: NEGATIVE mg/dL
Specific Gravity, Urine: 1.024 (ref 1.005–1.030)

## 2013-04-28 LAB — CBC WITH DIFFERENTIAL/PLATELET
Basophils Absolute: 0 10*3/uL (ref 0.0–0.1)
Basophils Relative: 1 % (ref 0–1)
HCT: 38.7 % (ref 36.0–46.0)
MCHC: 33.9 g/dL (ref 30.0–36.0)
Monocytes Absolute: 0.3 10*3/uL (ref 0.1–1.0)
Neutro Abs: 2.4 10*3/uL (ref 1.7–7.7)
Platelets: 284 10*3/uL (ref 150–400)
RDW: 12.5 % (ref 11.5–15.5)

## 2013-04-28 LAB — WET PREP, GENITAL
Trich, Wet Prep: NONE SEEN
Yeast Wet Prep HPF POC: NONE SEEN

## 2013-04-28 LAB — GC/CHLAMYDIA PROBE AMP: CT Probe RNA: NEGATIVE

## 2013-04-28 LAB — POCT PREGNANCY, URINE: Preg Test, Ur: NEGATIVE

## 2013-04-28 MED ORDER — ONDANSETRON HCL 4 MG/2ML IJ SOLN
4.0000 mg | Freq: Once | INTRAMUSCULAR | Status: AC
  Administered 2013-04-28: 4 mg via INTRAVENOUS
  Filled 2013-04-28: qty 2

## 2013-04-28 MED ORDER — HYDROMORPHONE HCL PF 1 MG/ML IJ SOLN
0.5000 mg | Freq: Once | INTRAMUSCULAR | Status: AC
  Administered 2013-04-28: 0.5 mg via INTRAVENOUS
  Filled 2013-04-28: qty 1

## 2013-04-28 MED ORDER — TRAMADOL HCL 50 MG PO TABS: 50.0000 mg | ORAL_TABLET | Freq: Four times a day (QID) | ORAL | Status: DC | PRN

## 2013-04-28 MED ORDER — HYDROMORPHONE HCL PF 1 MG/ML IJ SOLN
1.0000 mg | Freq: Once | INTRAMUSCULAR | Status: AC
  Administered 2013-04-28: 1 mg via INTRAVENOUS
  Filled 2013-04-28: qty 1

## 2013-04-28 NOTE — ED Notes (Addendum)
Pt with multiple complaints of back pain, rt flank pain, nausea, difficulty urinating, states she has difficulty breathing at times.

## 2013-04-28 NOTE — ED Provider Notes (Signed)
This is a Public relations account executive from [employer] at shift change: Tonya Davidson is a 22 y.o. female complaining with past medical history significant for IBS, fibromyalgia presenting with right suprapubic and right flank pain associated with nausea vomiting and diarrhea for 4 days. Patient's blood work is unremarkable, she has recently had multiple exams for similar complaints. Plan is to followup for renal ultrasound.   Pt is hemodynamically stable, appropriate for, and amenable to discharge at this time. All questions answered. Pt verbalized understanding and agrees with care plan. Outpatient follow-up and specific return precautions discussed.    Discharge Medication List as of 04/28/2013  6:54 AM    START taking these medications   Details  traMADol (ULTRAM) 50 MG tablet Take 1 tablet (50 mg total) by mouth every 6 (six) hours as needed for pain., Starting 04/28/2013, Until Discontinued, [employer], PA-C 04/28/13 [phone]

## 2013-04-28 NOTE — ED Provider Notes (Signed)
Medical screening examination/treatment/procedure(s) were performed by non-physician practitioner and as supervising physician I was immediately available for consultation/collaboration.    Emmamarie Kluender D Veron Senner, MD 04/28/13 0732 

## 2013-04-28 NOTE — ED Notes (Signed)
Attempted to draw labs. Pt requested labs be drawn at start of IV
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
## 2013-04-29 LAB — URINE CULTURE

## 2013-05-01 NOTE — ED Provider Notes (Signed)
Medical screening examination/treatment/procedure(s) were performed by non-physician practitioner and as supervising physician I was immediately available for consultation/collaboration.    Mikal Wisman D Buford Gayler, MD 05/01/13 0813 

## 2013-05-24 ENCOUNTER — Emergency Department (HOSPITAL_COMMUNITY)
Admission: EM | Admit: 2013-05-24 | Discharge: 2013-05-25 | Disposition: A | Discharge status: Home / Self Care | Payer: Managed Care, Other (non HMO) | Attending: Emergency Medicine | Admitting: Emergency Medicine

## 2013-05-24 ENCOUNTER — Encounter (HOSPITAL_COMMUNITY): Payer: Self-pay | Admitting: Emergency Medicine

## 2013-05-24 DIAGNOSIS — F1910 Other psychoactive substance abuse, uncomplicated: Secondary | ICD-10-CM

## 2013-05-24 DIAGNOSIS — Y9389 Activity, other specified: Secondary | ICD-10-CM | POA: Insufficient documentation

## 2013-05-24 DIAGNOSIS — Z87448 Personal history of other diseases of urinary system: Secondary | ICD-10-CM | POA: Insufficient documentation

## 2013-05-24 DIAGNOSIS — Z91040 Latex allergy status: Secondary | ICD-10-CM | POA: Insufficient documentation

## 2013-05-24 DIAGNOSIS — F319 Bipolar disorder, unspecified: Secondary | ICD-10-CM | POA: Insufficient documentation

## 2013-05-24 DIAGNOSIS — IMO0001 Reserved for inherently not codable concepts without codable children: Secondary | ICD-10-CM | POA: Insufficient documentation

## 2013-05-24 DIAGNOSIS — F1210 Cannabis abuse, uncomplicated: Secondary | ICD-10-CM | POA: Insufficient documentation

## 2013-05-24 DIAGNOSIS — Z8742 Personal history of other diseases of the female genital tract: Secondary | ICD-10-CM | POA: Insufficient documentation

## 2013-05-24 DIAGNOSIS — M129 Arthropathy, unspecified: Secondary | ICD-10-CM | POA: Insufficient documentation

## 2013-05-24 DIAGNOSIS — F411 Generalized anxiety disorder: Secondary | ICD-10-CM | POA: Insufficient documentation

## 2013-05-24 DIAGNOSIS — Z79899 Other long term (current) drug therapy: Secondary | ICD-10-CM | POA: Insufficient documentation

## 2013-05-24 DIAGNOSIS — T4271XA Poisoning by unspecified antiepileptic and sedative-hypnotic drugs, accidental (unintentional), initial encounter: Secondary | ICD-10-CM | POA: Insufficient documentation

## 2013-05-24 DIAGNOSIS — Z3202 Encounter for pregnancy test, result negative: Secondary | ICD-10-CM | POA: Insufficient documentation

## 2013-05-24 DIAGNOSIS — Y9289 Other specified places as the place of occurrence of the external cause: Secondary | ICD-10-CM | POA: Insufficient documentation

## 2013-05-24 DIAGNOSIS — Z862 Personal history of diseases of the blood and blood-forming organs and certain disorders involving the immune mechanism: Secondary | ICD-10-CM | POA: Insufficient documentation

## 2013-05-24 DIAGNOSIS — Z872 Personal history of diseases of the skin and subcutaneous tissue: Secondary | ICD-10-CM | POA: Insufficient documentation

## 2013-05-24 DIAGNOSIS — R450 Nervousness: Secondary | ICD-10-CM | POA: Insufficient documentation

## 2013-05-24 DIAGNOSIS — Z8719 Personal history of other diseases of the digestive system: Secondary | ICD-10-CM | POA: Insufficient documentation

## 2013-05-24 DIAGNOSIS — F3289 Other specified depressive episodes: Secondary | ICD-10-CM | POA: Insufficient documentation

## 2013-05-24 DIAGNOSIS — F329 Major depressive disorder, single episode, unspecified: Secondary | ICD-10-CM | POA: Insufficient documentation

## 2013-05-24 DIAGNOSIS — Z8619 Personal history of other infectious and parasitic diseases: Secondary | ICD-10-CM | POA: Insufficient documentation

## 2013-05-24 LAB — CBC
MCH: 30.4 pg (ref 26.0–34.0)
MCHC: 33.3 g/dL (ref 30.0–36.0)
Platelets: 248 10*3/uL (ref 150–400)

## 2013-05-24 NOTE — ED Notes (Signed)
Pt tearful at times, admits she has been lying to family lately but unclear about what. Pt does admit to having an addiction problem in the past. Per mother pt [facility], they were advised to bring pt here tonight to start med clearance process for detox

## 2013-05-24 NOTE — ED Notes (Signed)
Pt brought to ED by mother for ?addiction to pain medication and bizarre behavior.

## 2013-05-25 LAB — COMPREHENSIVE METABOLIC PANEL
ALT: 29 U/L (ref 0–35)
AST: 26 U/L (ref 0–37)
Calcium: 9.1 mg/dL (ref 8.4–10.5)
GFR calc Af Amer: >90 mL/min (ref >90)
Glucose, Bld: 108 mg/dL — ABNORMAL HIGH (ref 70–99)
Sodium: 138 mEq/L (ref 135–145)
Total Protein: 6.9 g/dL (ref 6.0–8.3)

## 2013-05-25 LAB — RAPID URINE DRUG SCREEN, HOSP PERFORMED
Cocaine: NOT DETECTED
Opiates: NOT DETECTED
Tetrahydrocannabinol: POSITIVE — AB

## 2013-05-25 LAB — SALICYLATE LEVEL: Salicylate Lvl: <2 mg/dL — ABNORMAL LOW (ref 2.8–20.0)

## 2013-05-25 NOTE — ED Provider Notes (Signed)
CSN: [id number]     Arrival date & time 05/24/13  2239 History   First MD Initiated Contact with Patient 05/24/13 2311     Chief Complaint  Patient presents with  . Medical Clearance   (Consider location/radiation/quality/duration/timing/severity/associated sxs/prior Treatment) HPI RENI HAUSNER is a 22 y.o. female who presents to emergency department accompanied by mother who requests detox for the patient. According to the patient who does not want to be here and does not know why she is here, patient states her mom thinks that she abuses prescription pain medications and she wants her to get clean. Patient states that she does not abuse pain medications and only takes Ultram which was prescribed to her. Patient states that she does smoke marijuana but that is the only drug she uses at this time. Patient admits to having mood swings and states that her bipolar. She states she did get tearful yesterday and told her mom that she wanted to be with her father, patient states her father is "in heaven." She states that she never wanted to hurt herself. Patient states she currently lives in [place] and is here visiting her mother. Patient states that she is not suicidal homicidal. Patient states she will does not want to be in emergency department or get any detox help. According to the mother, she believes that patient has psychiatric issues, anger management problems, and polysubstance abuse and states that she is ordered about her and moderate to get inpatient help. Mother states she called multiple inpatient treatment center; come to emergency department. Past Medical History  Diagnosis Date  . IBS (irritable bowel syndrome)   . Bipolar 1 disorder   . Mononucleosis   . Kidney stones   . PID (acute pelvic inflammatory disease) 04/28/2012  . Chlamydia 04/29/2012  . C. difficile diarrhea   . Anemia   . Anxiety   . Arthritis   . Depression   . Ulcer   . Abnormal Pap smear     colposcopy  . HPV  in female   . Fibromyalgia    Past Surgical History  Procedure Laterality Date  . Kidney stone surgery    . Tonsillectomy    . Inner ear surgery    . Wisdom tooth extraction    . Colposcopy     Family History  Problem Relation Age of Onset  . Bipolar disorder Mother   . Cancer Father   . ADD / ADHD Brother   . Cancer Maternal Grandmother   . Cancer Maternal Grandfather   . Cancer Paternal Grandmother   . Cancer Paternal Grandfather    History  Substance Use Topics  . Smoking status: Current Every Day Smoker -- 0.50 packs/day for 10 years    Types: Cigarettes  . Smokeless tobacco: Never Used  . Alcohol Use: No   OB History   Grav Para Term Preterm Abortions TAB SAB Ect Mult Living   0              Review of Systems  Constitutional: Negative for fever and chills.  HENT: Negative for neck pain and neck stiffness.   Respiratory: Negative for cough, chest tightness and shortness of breath.   Cardiovascular: Negative for chest pain, palpitations and leg swelling.  Gastrointestinal: Negative for nausea, vomiting, abdominal pain and diarrhea.  Genitourinary: Negative for dysuria, flank pain, vaginal bleeding, vaginal discharge, vaginal pain and pelvic pain.  Musculoskeletal: Negative for myalgias and arthralgias.  Skin: Negative for rash.  Neurological: Negative for dizziness,  weakness and headaches.  Psychiatric/Behavioral: Positive for behavioral problems. Negative for suicidal ideas, hallucinations, confusion, self-injury, decreased concentration and agitation. The patient is nervous/anxious.   All other systems reviewed and are negative.    Allergies  Azithromycin; Prednisone; Bactrim; Doxycycline hyclate; and Latex  Home Medications   Current Outpatient Rx  Name  Route  Sig  Dispense  Refill  . acetaminophen (TYLENOL) 500 MG tablet   Oral   Take 1,000 mg by mouth every 6 (six) hours as needed for pain.         . clonazePAM (KLONOPIN) 1 MG tablet   Oral    Take 2 mg by mouth at bedtime as needed for anxiety. For anxiety         . ibuprofen (ADVIL,MOTRIN) 200 MG tablet   Oral   Take 600 mg by mouth every 6 (six) hours as needed for pain.         . norgestimate-ethinyl estradiol (ORTHO-CYCLEN,SPRINTEC,PREVIFEM) 0.25-35 MG-MCG tablet   Oral   Take 1 tablet by mouth every morning.          . phenazopyridine (PYRIDIUM) 95 MG tablet   Oral   Take 95 mg by mouth 3 (three) times daily as needed for pain.         . promethazine (PHENERGAN) 25 MG tablet   Oral   Take 1 tablet (25 mg total) by mouth every 8 (eight) hours as needed for nausea.   20 tablet   0   . traMADol (ULTRAM) 50 MG tablet   Oral   Take 1 tablet (50 mg total) by mouth every 6 (six) hours as needed for pain.   8 tablet   0    BP 108/78  Pulse 125  Temp(Src) 97.8 F (36.6 C) (Oral)  Resp 18  Ht 5\' 4"  (1.626 m)  SpO2 98%  LMP 04/07/2013 Physical Exam  Nursing note and vitals reviewed. Constitutional: She appears well-developed and well-nourished. No distress.  HENT:  Head: Normocephalic.  Eyes: Conjunctivae are normal.  Neck: Neck supple.  Cardiovascular: Normal rate, regular rhythm and normal heart sounds.   Pulmonary/Chest: Effort normal and breath sounds normal. No respiratory distress. She has no wheezes. She has no rales.  Abdominal: Soft. Bowel sounds are normal. She exhibits no distension. There is no tenderness. There is no rebound.  Musculoskeletal: She exhibits no edema.  Neurological: She is alert.  Skin: Skin is warm and dry.  Psychiatric: She has a normal mood and affect. Her behavior is normal.    ED Course  Procedures (including critical care time) Labs Review Labs Reviewed  CBC - Abnormal; Notable for the following:    RBC 3.85 (*)    Hemoglobin 11.7 (*)    HCT 35.1 (*)    All other components within normal limits  COMPREHENSIVE METABOLIC PANEL - Abnormal; Notable for the following:    Glucose, Bld 108 (*)    Albumin 3.4 (*)     GFR calc non Af Amer 86 (*)    All other components within normal limits  SALICYLATE LEVEL - Abnormal; Notable for the following:    Salicylate Lvl <2.0 (*)    All other components within normal limits  URINE RAPID DRUG SCREEN (HOSP PERFORMED) - Abnormal; Notable for the following:    Tetrahydrocannabinol POSITIVE (*)    All other components within normal limits  ETHANOL  ACETAMINOPHEN LEVEL   Imaging Review No results found.  MDM   1. Polysubstance abuse    Patient  in emergency department after being brought in by her mom and her stepfather. Patient states that she's not suicidal or homicidal and is not limited he emergency department or go through detox. In fact patient denies any prescription medication abuse or any drug use other than marijuana. Have discussed this with Dr. Patria Mane who came and talked to the patient, patient's mother, patient's stepdad. Patient comfort just began that she does not want detox and she's not suicidal or homicidal. We discussed this with her mother who he came very upset and angry that we're not doing anything for her daughter. We explained to her that at this time patient denies wanting to go through detox program in she does not appear to be direct to herself or others and therefore cannot be kept involuntarily. Patient was given outpatient resources for a followup. This was discussed with patient as well and the importance of seeking help if she believes she needs it. Patient was also encouraged to return to emergency Department if she believes she needs some help. Patient discharged in stable condition.      Lottie Mussel, PA-C 05/25/13 [phone]

## 2013-05-26 NOTE — ED Provider Notes (Signed)
Medical screening examination/treatment/procedure(s) were conducted as a shared visit with non-physician practitioner(s) and myself.  I personally evaluated the patient during the encounter  The patient states that she does not have an issue with narcotics and does not feel like she needs any help.  She is not homicidal or suicidal.  She states she is prescribed tramadol and takes it only as prescribed.  The patient was brought to the emergency department by the mother who has concerns about substance abuse and concerns about the lifestyle choices that her daughter is making.  Unfortunately the patient does not want her assistance at this time.  I provided outpatient resources for her.  I attempted to explain this difficult situation to the mother who became extremely frustrated with my explanation however I cannot keep the patient in the emergency department against her will.  There is no indication for involuntary commitment.  I was able to explain this better to the stepfather who seem to understand the unfortunate situation.  Lyanne Co, MD 05/26/13 0040

## 2013-08-15 ENCOUNTER — Emergency Department (HOSPITAL_COMMUNITY)
Admission: EM | Admit: 2013-08-15 | Discharge: 2013-08-16 | Disposition: A | Discharge status: Home / Self Care | Payer: Managed Care, Other (non HMO) | Attending: Emergency Medicine | Admitting: Emergency Medicine

## 2013-08-15 ENCOUNTER — Emergency Department (HOSPITAL_COMMUNITY): Payer: Managed Care, Other (non HMO)

## 2013-08-15 ENCOUNTER — Encounter (HOSPITAL_COMMUNITY): Payer: Self-pay | Admitting: Emergency Medicine

## 2013-08-15 DIAGNOSIS — Z872 Personal history of diseases of the skin and subcutaneous tissue: Secondary | ICD-10-CM | POA: Insufficient documentation

## 2013-08-15 DIAGNOSIS — Z3202 Encounter for pregnancy test, result negative: Secondary | ICD-10-CM | POA: Insufficient documentation

## 2013-08-15 DIAGNOSIS — Z8619 Personal history of other infectious and parasitic diseases: Secondary | ICD-10-CM | POA: Insufficient documentation

## 2013-08-15 DIAGNOSIS — Z8742 Personal history of other diseases of the female genital tract: Secondary | ICD-10-CM | POA: Insufficient documentation

## 2013-08-15 DIAGNOSIS — F411 Generalized anxiety disorder: Secondary | ICD-10-CM | POA: Insufficient documentation

## 2013-08-15 DIAGNOSIS — Z9889 Other specified postprocedural states: Secondary | ICD-10-CM | POA: Insufficient documentation

## 2013-08-15 DIAGNOSIS — Z79899 Other long term (current) drug therapy: Secondary | ICD-10-CM | POA: Insufficient documentation

## 2013-08-15 DIAGNOSIS — Z862 Personal history of diseases of the blood and blood-forming organs and certain disorders involving the immune mechanism: Secondary | ICD-10-CM | POA: Insufficient documentation

## 2013-08-15 DIAGNOSIS — Z91040 Latex allergy status: Secondary | ICD-10-CM | POA: Insufficient documentation

## 2013-08-15 DIAGNOSIS — F17200 Nicotine dependence, unspecified, uncomplicated: Secondary | ICD-10-CM | POA: Insufficient documentation

## 2013-08-15 DIAGNOSIS — F319 Bipolar disorder, unspecified: Secondary | ICD-10-CM | POA: Insufficient documentation

## 2013-08-15 DIAGNOSIS — Z87442 Personal history of urinary calculi: Secondary | ICD-10-CM | POA: Insufficient documentation

## 2013-08-15 DIAGNOSIS — N12 Tubulo-interstitial nephritis, not specified as acute or chronic: Secondary | ICD-10-CM

## 2013-08-15 DIAGNOSIS — M129 Arthropathy, unspecified: Secondary | ICD-10-CM | POA: Insufficient documentation

## 2013-08-15 LAB — CBC
HCT: 36.8 % (ref 36.0–46.0)
Hemoglobin: 12.6 g/dL (ref 12.0–15.0)
MCH: 31.2 pg (ref 26.0–34.0)
MCHC: 34.2 g/dL (ref 30.0–36.0)
RDW: 12.8 % (ref 11.5–15.5)
WBC: 5.9 10*3/uL (ref 4.0–10.5)

## 2013-08-15 LAB — PREGNANCY, URINE: Preg Test, Ur: NEGATIVE

## 2013-08-15 LAB — URINALYSIS, ROUTINE W REFLEX MICROSCOPIC
Glucose, UA: NEGATIVE mg/dL
Ketones, ur: NEGATIVE mg/dL
Nitrite: NEGATIVE
Protein, ur: NEGATIVE mg/dL
Urobilinogen, UA: 1 mg/dL (ref 0.0–1.0)
pH: 7 (ref 5.0–8.0)

## 2013-08-15 LAB — BASIC METABOLIC PANEL
BUN: 8 mg/dL (ref 6–23)
Calcium: 9.2 mg/dL (ref 8.4–10.5)
Chloride: 103 mEq/L (ref 96–112)
Creatinine, Ser: 0.63 mg/dL (ref 0.50–1.10)
GFR calc non Af Amer: >90 mL/min (ref >90)
Glucose, Bld: 120 mg/dL — ABNORMAL HIGH (ref 70–99)

## 2013-08-15 LAB — URINE MICROSCOPIC-ADD ON

## 2013-08-15 MED ORDER — HYDROMORPHONE HCL PF 1 MG/ML IJ SOLN
1.0000 mg | Freq: Once | INTRAMUSCULAR | Status: AC
  Administered 2013-08-15: 1 mg via INTRAVENOUS
  Filled 2013-08-15: qty 1

## 2013-08-15 MED ORDER — PHENAZOPYRIDINE HCL 200 MG PO TABS: 200.0000 mg | ORAL_TABLET | Freq: Three times a day (TID) | ORAL | Status: DC

## 2013-08-15 MED ORDER — NITROFURANTOIN MONOHYD MACRO 100 MG PO CAPS: 100.0000 mg | ORAL_CAPSULE | Freq: Two times a day (BID) | ORAL | Status: DC

## 2013-08-15 MED ORDER — DEXTROSE 5 % IV SOLN
1.0000 g | Freq: Once | INTRAVENOUS | Status: AC
  Administered 2013-08-15: 1 g via INTRAVENOUS
  Filled 2013-08-15: qty 10

## 2013-08-15 MED ORDER — ONDANSETRON HCL 4 MG/2ML IJ SOLN
4.0000 mg | Freq: Once | INTRAMUSCULAR | Status: AC
  Administered 2013-08-15: 4 mg via INTRAVENOUS
  Filled 2013-08-15: qty 2

## 2013-08-15 NOTE — ED Provider Notes (Signed)
CSN: [id number]     Arrival date & time 08/15/13  2136 History   First MD Initiated Contact with Patient 08/15/13 2207     Chief Complaint  Patient presents with  . Flank Pain   (Consider location/radiation/quality/duration/timing/severity/associated sxs/prior Treatment) HPI Pt presents with c/o left sided flank pain.  Pt states pain began acutely approx 2 hours ago.  However she does say she had some dull pain in the same area for the past 2 days.  Pain worse with urination.  Denies blood in urine, no increased frequency or urgency.  No fever/chills.  No vomiting.  She states when she has had flank pain in the past it has been on her right side.  Per chart review she has had renal ultrasound, urology followup and There are no other associated systemic symptoms, there are no other alleviating or modifying factors. Pt has not tried anything for her symptoms prior to arrival.  There are no other associated systemic symptoms, there are no other alleviating or modifying factors.   Past Medical History  Diagnosis Date  . IBS (irritable bowel syndrome)   . Bipolar 1 disorder   . Mononucleosis   . Kidney stones   . PID (acute pelvic inflammatory disease) 04/28/2012  . Chlamydia 04/29/2012  . C. difficile diarrhea   . Anemia   . Anxiety   . Arthritis   . Depression   . Ulcer   . Abnormal Pap smear     colposcopy  . HPV in female   . Fibromyalgia    Past Surgical History  Procedure Laterality Date  . Kidney stone surgery    . Tonsillectomy    . Inner ear surgery    . Wisdom tooth extraction    . Colposcopy     Family History  Problem Relation Age of Onset  . Bipolar disorder Mother   . Cancer Father   . ADD / ADHD Brother   . Cancer Maternal Grandmother   . Cancer Maternal Grandfather   . Cancer Paternal Grandmother   . Cancer Paternal Grandfather    History  Substance Use Topics  . Smoking status: Current Every Day Smoker -- 0.50 packs/day for 10 years    Types: Cigarettes   . Smokeless tobacco: Never Used  . Alcohol Use: No   OB History   Grav Para Term Preterm Abortions TAB SAB Ect Mult Living   0              Review of Systems ROS reviewed and all otherwise negative except for mentioned in HPI  Allergies  Azithromycin; Prednisone; Bactrim; Doxycycline hyclate; and Latex  Home Medications   Current Outpatient Rx  Name  Route  Sig  Dispense  Refill  . acetaminophen (TYLENOL) 500 MG tablet   Oral   Take 1,000 mg by mouth every 6 (six) hours as needed for pain.         . clonazePAM (KLONOPIN) 1 MG tablet   Oral   Take 2 mg by mouth at bedtime as needed for anxiety. For anxiety         . ibuprofen (ADVIL,MOTRIN) 200 MG tablet   Oral   Take 600 mg by mouth every 6 (six) hours as needed for pain.         . norgestimate-ethinyl estradiol (ORTHO-CYCLEN,SPRINTEC,PREVIFEM) 0.25-35 MG-MCG tablet   Oral   Take 1 tablet by mouth every morning.          . promethazine (PHENERGAN) 25 MG tablet  Oral   Take 1 tablet (25 mg total) by mouth every 8 (eight) hours as needed for nausea.   20 tablet   0   . nitrofurantoin, macrocrystal-monohydrate, (MACROBID) 100 MG capsule   Oral   Take 1 capsule (100 mg total) by mouth 2 (two) times daily.   14 capsule   0   . phenazopyridine (PYRIDIUM) 200 MG tablet   Oral   Take 1 tablet (200 mg total) by mouth 3 (three) times daily.   6 tablet   0    BP 98/66  Pulse 72  Temp(Src) 97.7 F (36.5 C) (Oral)  Resp 17  SpO2 97%  LMP 05/19/2013 Vitals reviewed Physical Exam Physical Examination: General appearance - alert, uncomfortable appearing, and in no distress Mental status - alert, oriented to person, place, and time Eyes - no conjunctival injection, no scleral icterus Mouth - mucous membranes moist, pharynx normal without lesions Chest - clear to auscultation, no wheezes, rales or rhonchi, symmetric air entry Heart - normal rate, regular rhythm, normal S1, S2, no murmurs, rubs, clicks or  gallops Abdomen - soft, nontender, nondistended, no masses or organomegaly, nabs Back exam - full range of motion, no tenderness, palpable spasm or pain on motion, left CVA tenderness Extremities - peripheral pulses normal, no pedal edema, no clubbing or cyanosis Skin - normal coloration and turgor, no rashes  ED Course  Procedures (including critical care time) Labs Review Labs Reviewed  URINALYSIS, ROUTINE W REFLEX MICROSCOPIC - Abnormal; Notable for the following:    APPearance CLOUDY (*)    Hgb urine dipstick SMALL (*)    Leukocytes, UA LARGE (*)    All other components within normal limits  BASIC METABOLIC PANEL - Abnormal; Notable for the following:    Glucose, Bld 120 (*)    All other components within normal limits  URINE MICROSCOPIC-ADD ON - Abnormal; Notable for the following:    Squamous Epithelial / LPF FEW (*)    Crystals CA OXALATE CRYSTALS (*)    All other components within normal limits  URINE CULTURE  PREGNANCY, URINE  CBC   Imaging Review Ct Abdomen Pelvis Wo Contrast  08/15/2013   CLINICAL DATA:  22 year old female with left flank, abdominal and pelvic pain. History of urinary calculi.  EXAM: CT ABDOMEN AND PELVIS WITHOUT CONTRAST  TECHNIQUE: Multidetector CT imaging of the abdomen and pelvis was performed following the standard protocol without intravenous contrast.  COMPARISON:  07/29/2013 and prior CTs  FINDINGS: The lung bases are clear.  A 2 mm nonobstructing calculus within the left lower kidney is again noted.  There is no evidence of hydronephrosis or obstructing urinary calculus.  The liver, gallbladder, spleen, adrenal glands and pancreas are unremarkable.  Please note that parenchymal abnormalities may be missed without intravenous contrast.  There is no evidence of free fluid, enlarged lymph nodes, biliary dilation or abdominal aortic aneurysm.  The bowel and bladder are unremarkable. There is no evidence of bowel obstruction, abscess or pneumoperitoneum.   No acute or suspicious bony abnormalities are identified.  IMPRESSION: No evidence of acute abnormality.  Unchanged nonobstructing 2 mm left lower pole renal calculus. No evidence of hydronephrosis or obstructing urinary calculi.   Electronically Signed   By: Laveda Abbe M.D.   On: 08/15/2013 23:21    EKG Interpretation   None       MDM   1. Pyelonephritis    Pt presenting with c/o left flank pain, UA shows signs of UTI, ct scan shows  no stones.  Vitals improving. Pt given rocephin in the ED.  Based on prior culture results pt given rx for macrobid.  Discharged with strict return precautions.  Pt agreeable with plan.    Ethelda Chick, MD 08/17/13 [id number]

## 2013-08-15 NOTE — ED Notes (Signed)
Pt. Made aware for the need of urine. 

## 2013-08-15 NOTE — ED Notes (Signed)
Pt is c/o bilateral flank pain  Pt states it started 2 days ago but tonight about 2 hrs ago she went to the bathroom to void and just as she finished she got severe sharp pain  Pt states both sides hurt the left worse than the right

## 2013-08-15 NOTE — ED Notes (Signed)
Pt. Reminded for urinalysis. Verbalized understanding.

## 2013-08-15 NOTE — ED Notes (Signed)
MD at bedside. 

## 2013-08-16 MED ORDER — OXYCODONE-ACETAMINOPHEN 5-325 MG PO TABS
1.0000 | ORAL_TABLET | Freq: Once | ORAL | Status: AC
  Administered 2013-08-16: 1 via ORAL
  Filled 2013-08-16: qty 1

## 2013-08-19 LAB — URINE CULTURE: Colony Count: >=100000

## 2013-08-29 ENCOUNTER — Emergency Department (HOSPITAL_COMMUNITY)
Admission: EM | Admit: 2013-08-29 | Discharge: 2013-08-29 | Disposition: A | Discharge status: Home / Self Care | Payer: Managed Care, Other (non HMO) | Attending: Emergency Medicine | Admitting: Emergency Medicine

## 2013-08-29 ENCOUNTER — Encounter (HOSPITAL_COMMUNITY): Payer: Self-pay | Admitting: Emergency Medicine

## 2013-08-29 DIAGNOSIS — Z8619 Personal history of other infectious and parasitic diseases: Secondary | ICD-10-CM | POA: Insufficient documentation

## 2013-08-29 DIAGNOSIS — R197 Diarrhea, unspecified: Secondary | ICD-10-CM | POA: Insufficient documentation

## 2013-08-29 DIAGNOSIS — F17200 Nicotine dependence, unspecified, uncomplicated: Secondary | ICD-10-CM | POA: Insufficient documentation

## 2013-08-29 DIAGNOSIS — Z8742 Personal history of other diseases of the female genital tract: Secondary | ICD-10-CM | POA: Insufficient documentation

## 2013-08-29 DIAGNOSIS — IMO0001 Reserved for inherently not codable concepts without codable children: Secondary | ICD-10-CM | POA: Insufficient documentation

## 2013-08-29 DIAGNOSIS — Z79899 Other long term (current) drug therapy: Secondary | ICD-10-CM | POA: Insufficient documentation

## 2013-08-29 DIAGNOSIS — Z91040 Latex allergy status: Secondary | ICD-10-CM | POA: Insufficient documentation

## 2013-08-29 DIAGNOSIS — R112 Nausea with vomiting, unspecified: Secondary | ICD-10-CM | POA: Insufficient documentation

## 2013-08-29 DIAGNOSIS — Z872 Personal history of diseases of the skin and subcutaneous tissue: Secondary | ICD-10-CM | POA: Insufficient documentation

## 2013-08-29 DIAGNOSIS — Z862 Personal history of diseases of the blood and blood-forming organs and certain disorders involving the immune mechanism: Secondary | ICD-10-CM | POA: Insufficient documentation

## 2013-08-29 DIAGNOSIS — R1084 Generalized abdominal pain: Secondary | ICD-10-CM | POA: Insufficient documentation

## 2013-08-29 DIAGNOSIS — F13939 Sedative, hypnotic or anxiolytic use, unspecified with withdrawal, unspecified: Secondary | ICD-10-CM

## 2013-08-29 DIAGNOSIS — F19939 Other psychoactive substance use, unspecified with withdrawal, unspecified: Secondary | ICD-10-CM | POA: Insufficient documentation

## 2013-08-29 DIAGNOSIS — M129 Arthropathy, unspecified: Secondary | ICD-10-CM | POA: Insufficient documentation

## 2013-08-29 DIAGNOSIS — F13239 Sedative, hypnotic or anxiolytic dependence with withdrawal, unspecified: Secondary | ICD-10-CM

## 2013-08-29 DIAGNOSIS — Z8719 Personal history of other diseases of the digestive system: Secondary | ICD-10-CM | POA: Insufficient documentation

## 2013-08-29 DIAGNOSIS — F319 Bipolar disorder, unspecified: Secondary | ICD-10-CM | POA: Insufficient documentation

## 2013-08-29 DIAGNOSIS — F1320 Sedative, hypnotic or anxiolytic dependence, uncomplicated: Secondary | ICD-10-CM | POA: Insufficient documentation

## 2013-08-29 DIAGNOSIS — Z87442 Personal history of urinary calculi: Secondary | ICD-10-CM | POA: Insufficient documentation

## 2013-08-29 MED ORDER — IBUPROFEN 200 MG PO TABS: 600.0000 mg | ORAL_TABLET | Freq: Four times a day (QID) | ORAL | Status: DC | PRN

## 2013-08-29 MED ORDER — LOPERAMIDE HCL 2 MG PO CAPS: 2.0000 mg | ORAL_CAPSULE | Freq: Four times a day (QID) | ORAL | Status: DC | PRN

## 2013-08-29 MED ORDER — PROMETHAZINE HCL 25 MG PO TABS: 25.0000 mg | ORAL_TABLET | Freq: Three times a day (TID) | ORAL | Status: DC | PRN

## 2013-08-29 MED ORDER — DICYCLOMINE HCL 20 MG PO TABS: 20.0000 mg | ORAL_TABLET | Freq: Two times a day (BID) | ORAL | Status: DC

## 2013-08-29 NOTE — ED Provider Notes (Signed)
CSN: [id number]     Arrival date & time 08/29/13  1406 History  This chart was scribed for non-physician practitioner, Fayrene Helper, PA-C working with Enid Skeens, MD by Greggory Stallion, ED scribe. This patient was seen in room [facility] and the patient's care was started at 2:56 PM.   Chief Complaint  Patient presents with  . Panic Attack   The history is provided by the patient. No language interpreter was used.   HPI Comments: Tonya Davidson is a 22 y.o. female who presents to the Emergency Department complaining of anxiety. She states she was taking 4 Klonopin a day for 8 years for a severe panic disorder. Pt stopped taking it 7-8 days ago at the request of her boyfriend and was given clonidine for withdrawal symptoms by her doctor. Since she has low blood sugar, she does not like to take clonidine and haven't been taking it. She feels very anxious and is having emesis and diarrhea since. Pt is also having abdominal pain but has chronic abdominal pain and this is not new. Denies SI/HI, seizures. Denies self medication including alcohol or rec drug. LMP 2 weeks ago.   Past Medical History  Diagnosis Date  . IBS (irritable bowel syndrome)   . Bipolar 1 disorder   . Mononucleosis   . Kidney stones   . PID (acute pelvic inflammatory disease) 04/28/2012  . Chlamydia 04/29/2012  . C. difficile diarrhea   . Anemia   . Anxiety   . Arthritis   . Depression   . Ulcer   . Abnormal Pap smear     colposcopy  . HPV in female   . Fibromyalgia    Past Surgical History  Procedure Laterality Date  . Kidney stone surgery    . Tonsillectomy    . Inner ear surgery    . Wisdom tooth extraction    . Colposcopy     Family History  Problem Relation Age of Onset  . Bipolar disorder Mother   . Cancer Father   . ADD / ADHD Brother   . Cancer Maternal Grandmother   . Cancer Maternal Grandfather   . Cancer Paternal Grandmother   . Cancer Paternal Grandfather    History  Substance Use Topics   . Smoking status: Current Every Day Smoker -- 0.50 packs/day for 10 years    Types: Cigarettes  . Smokeless tobacco: Never Used  . Alcohol Use: No   OB History   Grav Para Term Preterm Abortions TAB SAB Ect Mult Living   0              Review of Systems  Gastrointestinal: Positive for nausea, vomiting, abdominal pain and diarrhea.  Neurological: Negative for seizures.  Psychiatric/Behavioral: Negative for suicidal ideas. The patient is nervous/anxious.     Allergies  Azithromycin; Prednisone; Bactrim; Doxycycline hyclate; and Latex  Home Medications   Current Outpatient Rx  Name  Route  Sig  Dispense  Refill  . clonazePAM (KLONOPIN) 1 MG tablet   Oral   Take 2 mg by mouth at bedtime as needed for anxiety. For anxiety         . ibuprofen (ADVIL,MOTRIN) 200 MG tablet   Oral   Take 600 mg by mouth every 6 (six) hours as needed for pain.         . nitrofurantoin, macrocrystal-monohydrate, (MACROBID) 100 MG capsule   Oral   Take 100 mg by mouth 2 (two) times daily.         Marland Kitchen  norgestimate-ethinyl estradiol (ORTHO-CYCLEN,SPRINTEC,PREVIFEM) 0.25-35 MG-MCG tablet   Oral   Take 1 tablet by mouth every morning.          . phenazopyridine (PYRIDIUM) 200 MG tablet   Oral   Take 1 tablet (200 mg total) by mouth 3 (three) times daily.   6 tablet   0   . promethazine (PHENERGAN) 25 MG tablet   Oral   Take 1 tablet (25 mg total) by mouth every 8 (eight) hours as needed for nausea.   20 tablet   0    BP 111/82  Pulse 98  Temp(Src) 98.2 F (36.8 C) (Oral)  Resp 18  SpO2 100%  LMP 05/19/2013  Physical Exam  Nursing note and vitals reviewed. Constitutional: She is oriented to person, place, and time. She appears well-developed and well-nourished. No distress.  HENT:  Head: Normocephalic and atraumatic.  Eyes: EOM are normal.  Neck: Neck supple. No tracheal deviation present.  Cardiovascular: Normal rate, regular rhythm and normal heart sounds.  Exam reveals no  gallop and no friction rub.   No murmur heard. Pulmonary/Chest: Effort normal and breath sounds normal. No respiratory distress. She has no wheezes. She has no rales.  Abdominal: Soft. There is no guarding.  Mild, generalized abdominal tenderness.   Musculoskeletal: Normal range of motion.  Neurological: She is alert and oriented to person, place, and time.  Skin: Skin is warm and dry.  Psychiatric: She has a normal mood and affect. Her speech is normal and behavior is normal. Thought content is not paranoid. She expresses no homicidal and no suicidal ideation.    ED Course  Procedures (including critical care time)  DIAGNOSTIC STUDIES: Oxygen Saturation is 100% on RA, normal by my interpretation.    COORDINATION OF CARE: 3:00 PM-Discussed treatment plan which includes symptomatic treatment with pt at bedside and pt agreed to plan. Pt likely having withdrawal sxs from cessation of benzo.  She is however appears to be in NAD, vss, abdomen non surgical.  Plan to provide medications to help treats her sxs.  Otherwise pt is playful, smiling and talking to her friends who are in the room.  Low suspicion for benzo withdrawal seizure.  No SI/HI/ or hallucination.   Labs Review Labs Reviewed - No data to display Imaging Review No results found.  EKG Interpretation   None       MDM   1. Withdrawal from benzodiazepine    BP 111/82  Pulse 98  Temp(Src) 98.2 F (36.8 C) (Oral)  Resp 18  SpO2 100%  LMP 05/19/2013   I personally performed the services described in this documentation, which was scribed in my presence. The recorded information has been reviewed and is accurate.    Fayrene Helper, PA-C 08/29/13 1511

## 2013-08-29 NOTE — ED Notes (Signed)
Pt stopped taking clonopin 7-8 days ago. Pt ben having n/v/d ever since and pt now having anxiety and "feels like a walking panic attack".

## 2013-08-29 NOTE — ED Provider Notes (Signed)
Medical screening examination/treatment/procedure(s) were performed by non-physician practitioner and as supervising physician I was immediately available for consultation/collaboration.  EKG Interpretation   None         Lue Sykora M Alsie Younes, MD 08/29/13 2041 

## 2013-09-10 ENCOUNTER — Encounter (HOSPITAL_COMMUNITY): Payer: Self-pay | Admitting: Emergency Medicine

## 2013-09-10 ENCOUNTER — Emergency Department (HOSPITAL_COMMUNITY)
Admission: EM | Admit: 2013-09-10 | Discharge: 2013-09-11 | Disposition: A | Discharge status: Home / Self Care | Payer: Managed Care, Other (non HMO) | Attending: Emergency Medicine | Admitting: Emergency Medicine

## 2013-09-10 DIAGNOSIS — Z8742 Personal history of other diseases of the female genital tract: Secondary | ICD-10-CM | POA: Insufficient documentation

## 2013-09-10 DIAGNOSIS — Z3202 Encounter for pregnancy test, result negative: Secondary | ICD-10-CM | POA: Insufficient documentation

## 2013-09-10 DIAGNOSIS — Z79899 Other long term (current) drug therapy: Secondary | ICD-10-CM | POA: Insufficient documentation

## 2013-09-10 DIAGNOSIS — Z87440 Personal history of urinary (tract) infections: Secondary | ICD-10-CM | POA: Insufficient documentation

## 2013-09-10 DIAGNOSIS — M129 Arthropathy, unspecified: Secondary | ICD-10-CM | POA: Insufficient documentation

## 2013-09-10 DIAGNOSIS — Z8719 Personal history of other diseases of the digestive system: Secondary | ICD-10-CM | POA: Insufficient documentation

## 2013-09-10 DIAGNOSIS — Z91040 Latex allergy status: Secondary | ICD-10-CM | POA: Insufficient documentation

## 2013-09-10 DIAGNOSIS — Z8659 Personal history of other mental and behavioral disorders: Secondary | ICD-10-CM | POA: Insufficient documentation

## 2013-09-10 DIAGNOSIS — R1032 Left lower quadrant pain: Secondary | ICD-10-CM | POA: Insufficient documentation

## 2013-09-10 DIAGNOSIS — R300 Dysuria: Secondary | ICD-10-CM | POA: Insufficient documentation

## 2013-09-10 DIAGNOSIS — R110 Nausea: Secondary | ICD-10-CM | POA: Insufficient documentation

## 2013-09-10 DIAGNOSIS — Z8619 Personal history of other infectious and parasitic diseases: Secondary | ICD-10-CM | POA: Insufficient documentation

## 2013-09-10 DIAGNOSIS — F17200 Nicotine dependence, unspecified, uncomplicated: Secondary | ICD-10-CM | POA: Insufficient documentation

## 2013-09-10 DIAGNOSIS — M549 Dorsalgia, unspecified: Secondary | ICD-10-CM

## 2013-09-10 DIAGNOSIS — I959 Hypotension, unspecified: Secondary | ICD-10-CM | POA: Insufficient documentation

## 2013-09-10 DIAGNOSIS — N949 Unspecified condition associated with female genital organs and menstrual cycle: Secondary | ICD-10-CM | POA: Insufficient documentation

## 2013-09-10 DIAGNOSIS — Z87442 Personal history of urinary calculi: Secondary | ICD-10-CM | POA: Insufficient documentation

## 2013-09-10 DIAGNOSIS — Z862 Personal history of diseases of the blood and blood-forming organs and certain disorders involving the immune mechanism: Secondary | ICD-10-CM | POA: Insufficient documentation

## 2013-09-10 DIAGNOSIS — Z872 Personal history of diseases of the skin and subcutaneous tissue: Secondary | ICD-10-CM | POA: Insufficient documentation

## 2013-09-10 LAB — URINALYSIS, ROUTINE W REFLEX MICROSCOPIC
Bilirubin Urine: NEGATIVE
Glucose, UA: NEGATIVE mg/dL
Ketones, ur: NEGATIVE mg/dL
Protein, ur: NEGATIVE mg/dL
pH: 7 (ref 5.0–8.0)

## 2013-09-10 LAB — URINE MICROSCOPIC-ADD ON

## 2013-09-10 LAB — POCT PREGNANCY, URINE: Preg Test, Ur: NEGATIVE

## 2013-09-10 LAB — WET PREP, GENITAL

## 2013-09-10 MED ORDER — METOCLOPRAMIDE HCL 5 MG/ML IJ SOLN
10.0000 mg | Freq: Once | INTRAMUSCULAR | Status: AC
  Administered 2013-09-11: 10 mg via INTRAVENOUS
  Filled 2013-09-10: qty 2

## 2013-09-10 MED ORDER — MORPHINE SULFATE 4 MG/ML IJ SOLN
4.0000 mg | Freq: Once | INTRAMUSCULAR | Status: AC
  Administered 2013-09-10: 4 mg via INTRAVENOUS
  Filled 2013-09-10: qty 1

## 2013-09-10 MED ORDER — ONDANSETRON HCL 4 MG/2ML IJ SOLN
4.0000 mg | Freq: Once | INTRAMUSCULAR | Status: AC
  Administered 2013-09-10: 4 mg via INTRAVENOUS
  Filled 2013-09-10: qty 2

## 2013-09-10 MED ORDER — SODIUM CHLORIDE 0.9 % IV BOLUS (SEPSIS)
1000.0000 mL | Freq: Once | INTRAVENOUS | Status: AC
  Administered 2013-09-10: 1000 mL via INTRAVENOUS

## 2013-09-10 MED ORDER — KETOROLAC TROMETHAMINE 30 MG/ML IJ SOLN
30.0000 mg | Freq: Once | INTRAMUSCULAR | Status: AC
  Administered 2013-09-10: 30 mg via INTRAVENOUS
  Filled 2013-09-10: qty 1

## 2013-09-10 MED ORDER — OXYCODONE-ACETAMINOPHEN 5-325 MG PO TABS
1.0000 | ORAL_TABLET | Freq: Once | ORAL | Status: AC
  Administered 2013-09-11: 1 via ORAL
  Filled 2013-09-10: qty 1

## 2013-09-10 NOTE — ED Provider Notes (Signed)
CSN: [id number]     Arrival date & time 09/10/13  2012 History   First MD Initiated Contact with Patient 09/10/13 2047     Chief Complaint  Patient presents with  . Hematuria   (Consider location/radiation/quality/duration/timing/severity/associated sxs/prior Treatment) HPI Tonya Davidson is a 22 y.o. female  who presents to emergency department complaining of left flank pain and hematuria. She states she first noticed blood in her urine 4 days ago. Since then pain appeared in her suprapubic area in her left flank. She states she had a fever 101 3 days ago but none since. She states that she has some dysuria. She denies any vomiting or changes in bowels. She states she has had some nausea. She denies any abnormal vaginal discharge or bleeding. She states that she did take ibuprofen which did not help. Patient states that she has history of kidney stones. States also has history of pelvic infections and kidney infections.  Past Medical History  Diagnosis Date  . IBS (irritable bowel syndrome)   . Bipolar 1 disorder   . Mononucleosis   . Kidney stones   . PID (acute pelvic inflammatory disease) 04/28/2012  . Chlamydia 04/29/2012  . C. difficile diarrhea   . Anemia   . Anxiety   . Arthritis   . Depression   . Ulcer   . Abnormal Pap smear     colposcopy  . HPV in female   . Fibromyalgia    Past Surgical History  Procedure Laterality Date  . Kidney stone surgery    . Tonsillectomy    . Inner ear surgery    . Wisdom tooth extraction    . Colposcopy     Family History  Problem Relation Age of Onset  . Bipolar disorder Mother   . Cancer Father   . ADD / ADHD Brother   . Cancer Maternal Grandmother   . Cancer Maternal Grandfather   . Cancer Paternal Grandmother   . Cancer Paternal Grandfather    History  Substance Use Topics  . Smoking status: Current Every Day Smoker -- 0.50 packs/day for 10 years    Types: Cigarettes  . Smokeless tobacco: Never Used  . Alcohol Use: No    OB History   Grav Para Term Preterm Abortions TAB SAB Ect Mult Living   0              Review of Systems  Constitutional: Negative for fever and chills.  Respiratory: Negative.   Cardiovascular: Negative.   Gastrointestinal: Positive for nausea and abdominal pain. Negative for vomiting and diarrhea.  Genitourinary: Positive for dysuria, hematuria, flank pain and pelvic pain. Negative for vaginal bleeding, vaginal discharge and vaginal pain.  Musculoskeletal: Negative for arthralgias and myalgias.  Skin: Negative.   All other systems reviewed and are negative.    Allergies  Azithromycin; Prednisone; Bactrim; Doxycycline hyclate; and Latex  Home Medications   Current Outpatient Rx  Name  Route  Sig  Dispense  Refill  . dicyclomine (BENTYL) 20 MG tablet   Oral   Take 1 tablet (20 mg total) by mouth 2 (two) times daily.   20 tablet   0   . ibuprofen (ADVIL,MOTRIN) 200 MG tablet   Oral   Take 3 tablets (600 mg total) by mouth every 6 (six) hours as needed for moderate pain.   30 tablet   0   . loperamide (IMODIUM) 2 MG capsule   Oral   Take 1 capsule (2 mg total) by mouth  4 (four) times daily as needed for diarrhea or loose stools.   12 capsule   0   . norgestimate-ethinyl estradiol (ORTHO-CYCLEN,SPRINTEC,PREVIFEM) 0.25-35 MG-MCG tablet   Oral   Take 1 tablet by mouth every morning.          . phenazopyridine (PYRIDIUM) 200 MG tablet   Oral   Take 1 tablet (200 mg total) by mouth 3 (three) times daily.   6 tablet   0   . promethazine (PHENERGAN) 25 MG tablet   Oral   Take 1 tablet (25 mg total) by mouth every 8 (eight) hours as needed for nausea.   20 tablet   0    BP 113/81  Pulse 129  Temp(Src) 98.9 F (37.2 C) (Oral)  Resp 24  Ht 5\' 5"  (1.651 m)  Wt 102 lb (46.267 kg)  BMI 16.97 kg/m2  SpO2 99%  LMP 05/06/2013 Physical Exam  Nursing note and vitals reviewed. Constitutional: She appears well-developed and well-nourished. No distress.  HENT:   Head: Normocephalic.  Eyes: Conjunctivae are normal.  Neck: Neck supple.  Cardiovascular: Normal rate, regular rhythm and normal heart sounds.   Pulmonary/Chest: Effort normal and breath sounds normal. No respiratory distress. She has no wheezes. She has no rales.  Abdominal: Soft. Bowel sounds are normal. She exhibits no distension. There is tenderness. There is no rebound.  Suprapubic and LLQ tenderness, left CVA tenderness  Genitourinary:  Normal external genitalia. Normal vaginal canal. Normal cervix. Pain with CMT, uterine tenderness, bilateral adnexal tenderness.   Musculoskeletal: She exhibits no edema.  Neurological: She is alert.  Skin: Skin is warm and dry.  Psychiatric: She has a normal mood and affect. Her behavior is normal.    ED Course  Procedures (including critical care time) Labs Review Labs Reviewed  WET PREP, GENITAL - Abnormal; Notable for the following:    WBC, Wet Prep HPF POC FEW (*)    All other components within normal limits  URINALYSIS, ROUTINE W REFLEX MICROSCOPIC - Abnormal; Notable for the following:    APPearance CLOUDY (*)    Hgb urine dipstick LARGE (*)    Leukocytes, UA TRACE (*)    All other components within normal limits  URINE MICROSCOPIC-ADD ON - Abnormal; Notable for the following:    Squamous Epithelial / LPF MANY (*)    Bacteria, UA MANY (*)    All other components within normal limits  URINALYSIS W MICROSCOPIC + REFLEX CULTURE - Abnormal; Notable for the following:    APPearance CLOUDY (*)    All other components within normal limits  URINE CULTURE  GC/CHLAMYDIA PROBE AMP  POCT PREGNANCY, URINE   Imaging Review No results found.  EKG Interpretation   None       MDM   1. Back pain     Patient presenting to emergency department with flank pain, hematuria. States she's "peeing blood." Patient is acting very anxious and is rolling around in bed in pain. Patient has had prior visits for similar complaints, was diagnosed with  pyelonephritis in the past. She states she had a kidney stone. I also reviewed all her CT scans and she has had multiple of them over last couple years. None of the CT scans have showed an obstructive stone. She has also had numerous abdominal ultrasounds which were all normal. Her pelvic exam today is unremarkable. She did give [place] a urine sample which initially showed large hemoglobin and many epithelial cells. Have instructed the nurse to collect a in and  out sample for further evaluation to make sure she does not have an infection and to verify the large hemoglobin and too numerous to count red blood cells. Her behavior was suspicious to me because of her histrionic presentation, and her asking for pain medications over and over again, and maybe also because I just saw her here few weeks ago for polysubstance abuse.  Will repeat I&O urine.   12:53 AM We finally were able to obtain I&O urine after her refusing it, and then closing her legs and rolling off the bed after tech tried it first time. I expalined to her we are unable to diagnosed UTI or Pyelo from contaminated sample. She became angry, and would not let us try again, she was raising her voice, cursing, which raised my suspicion for possible drug seeking behavior even more. She finally let us do it, after I told her I cannot make a diagnosis. Her urine came back completely normal with no RBCs or Hgb. No signs of infection with no bacteria or WBCs. I will be letting pt go home with ibuprofen for her pain and follow up.    Filed Vitals:   09/10/13 2016 09/11/13 0050  BP: 113/81 109/75  Pulse: 129 98  Temp: 98.9 F (37.2 C) 98.5 F (36.9 C)  TempSrc: Oral Oral  Resp: 24 18  Height: 5\' 5"  (1.651 m)   Weight: 102 lb (46.267 kg)   SpO2: 99% 99%        Lottie Mussel, PA-C 09/11/13 0057

## 2013-09-10 NOTE — ED Notes (Signed)
Patient states that she feels like something is trying to crawl out of her vagina.

## 2013-09-10 NOTE — ED Notes (Signed)
Pt arrived to the ED with a complaint of hematuria.  Pt has a hx of kidney stones with three stents place previously.  Pt began urinating blood about 4 days ago and the pain has increased to a point of excess. Pain is on the left side.

## 2013-09-11 LAB — URINE CULTURE: Colony Count: NO GROWTH

## 2013-09-11 LAB — URINALYSIS W MICROSCOPIC + REFLEX CULTURE
Bilirubin Urine: NEGATIVE
Hgb urine dipstick: NEGATIVE
Leukocytes, UA: NEGATIVE
Nitrite: NEGATIVE
Protein, ur: NEGATIVE mg/dL
Specific Gravity, Urine: 1.021 (ref 1.005–1.030)
Urobilinogen, UA: 0.2 mg/dL (ref 0.0–1.0)
pH: 8 (ref 5.0–8.0)

## 2013-09-11 MED ORDER — IBUPROFEN 600 MG PO TABS: 600.0000 mg | ORAL_TABLET | Freq: Four times a day (QID) | ORAL | Status: DC | PRN

## 2013-09-11 NOTE — ED Provider Notes (Signed)
Medical screening examination/treatment/procedure(s) were performed by non-physician practitioner and as supervising physician I was immediately available for consultation/collaboration.  EKG Interpretation   None        Toy Baker, MD 09/11/13 1544

## 2013-09-12 LAB — GC/CHLAMYDIA PROBE AMP: GC Probe RNA: NEGATIVE

## 2013-09-29 ENCOUNTER — Encounter (HOSPITAL_COMMUNITY): Payer: Self-pay | Admitting: Emergency Medicine

## 2013-09-29 ENCOUNTER — Inpatient Hospital Stay (HOSPITAL_COMMUNITY)
Admission: AD | Admit: 2013-09-29 | Discharge: 2013-10-03 | DRG: 885 | Disposition: A | Discharge status: Home / Self Care | Payer: Managed Care, Other (non HMO) | Attending: Psychiatry | Admitting: Psychiatry

## 2013-09-29 ENCOUNTER — Emergency Department (HOSPITAL_COMMUNITY): Payer: Managed Care, Other (non HMO)

## 2013-09-29 ENCOUNTER — Emergency Department (HOSPITAL_COMMUNITY)
Admission: EM | Admit: 2013-09-29 | Discharge: 2013-09-29 | Disposition: A | Discharge status: Home / Self Care | Payer: Managed Care, Other (non HMO) | Attending: Emergency Medicine | Admitting: Emergency Medicine

## 2013-09-29 ENCOUNTER — Emergency Department (HOSPITAL_COMMUNITY)
Admission: EM | Admit: 2013-09-29 | Discharge: 2013-09-29 | Disposition: A | Discharge status: Other Acute Inpatient Hospital | Payer: Managed Care, Other (non HMO) | Attending: Emergency Medicine | Admitting: Emergency Medicine

## 2013-09-29 ENCOUNTER — Encounter (HOSPITAL_COMMUNITY): Payer: Self-pay | Admitting: General Practice

## 2013-09-29 DIAGNOSIS — F411 Generalized anxiety disorder: Secondary | ICD-10-CM | POA: Diagnosis present

## 2013-09-29 DIAGNOSIS — Z8719 Personal history of other diseases of the digestive system: Secondary | ICD-10-CM | POA: Diagnosis not present

## 2013-09-29 DIAGNOSIS — R110 Nausea: Secondary | ICD-10-CM | POA: Insufficient documentation

## 2013-09-29 DIAGNOSIS — A749 Chlamydial infection, unspecified: Secondary | ICD-10-CM

## 2013-09-29 DIAGNOSIS — Z008 Encounter for other general examination: Secondary | ICD-10-CM | POA: Diagnosis present

## 2013-09-29 DIAGNOSIS — R319 Hematuria, unspecified: Secondary | ICD-10-CM | POA: Insufficient documentation

## 2013-09-29 DIAGNOSIS — Z862 Personal history of diseases of the blood and blood-forming organs and certain disorders involving the immune mechanism: Secondary | ICD-10-CM | POA: Insufficient documentation

## 2013-09-29 DIAGNOSIS — F3289 Other specified depressive episodes: Secondary | ICD-10-CM | POA: Insufficient documentation

## 2013-09-29 DIAGNOSIS — Z872 Personal history of diseases of the skin and subcutaneous tissue: Secondary | ICD-10-CM | POA: Insufficient documentation

## 2013-09-29 DIAGNOSIS — Z3202 Encounter for pregnancy test, result negative: Secondary | ICD-10-CM | POA: Insufficient documentation

## 2013-09-29 DIAGNOSIS — G8929 Other chronic pain: Secondary | ICD-10-CM

## 2013-09-29 DIAGNOSIS — Z91040 Latex allergy status: Secondary | ICD-10-CM | POA: Insufficient documentation

## 2013-09-29 DIAGNOSIS — N730 Acute parametritis and pelvic cellulitis: Secondary | ICD-10-CM

## 2013-09-29 DIAGNOSIS — A0472 Enterocolitis due to Clostridium difficile, not specified as recurrent: Secondary | ICD-10-CM

## 2013-09-29 DIAGNOSIS — F329 Major depressive disorder, single episode, unspecified: Secondary | ICD-10-CM | POA: Diagnosis not present

## 2013-09-29 DIAGNOSIS — Z8739 Personal history of other diseases of the musculoskeletal system and connective tissue: Secondary | ICD-10-CM | POA: Diagnosis not present

## 2013-09-29 DIAGNOSIS — F32A Depression, unspecified: Secondary | ICD-10-CM

## 2013-09-29 DIAGNOSIS — Z8619 Personal history of other infectious and parasitic diseases: Secondary | ICD-10-CM | POA: Insufficient documentation

## 2013-09-29 DIAGNOSIS — F17200 Nicotine dependence, unspecified, uncomplicated: Secondary | ICD-10-CM | POA: Diagnosis not present

## 2013-09-29 DIAGNOSIS — Z8659 Personal history of other mental and behavioral disorders: Secondary | ICD-10-CM | POA: Diagnosis not present

## 2013-09-29 DIAGNOSIS — IMO0001 Reserved for inherently not codable concepts without codable children: Secondary | ICD-10-CM | POA: Diagnosis present

## 2013-09-29 DIAGNOSIS — Z87442 Personal history of urinary calculi: Secondary | ICD-10-CM | POA: Insufficient documentation

## 2013-09-29 DIAGNOSIS — Z8742 Personal history of other diseases of the female genital tract: Secondary | ICD-10-CM | POA: Diagnosis not present

## 2013-09-29 DIAGNOSIS — IMO0002 Reserved for concepts with insufficient information to code with codable children: Secondary | ICD-10-CM | POA: Diagnosis not present

## 2013-09-29 DIAGNOSIS — K589 Irritable bowel syndrome without diarrhea: Secondary | ICD-10-CM | POA: Diagnosis present

## 2013-09-29 DIAGNOSIS — F332 Major depressive disorder, recurrent severe without psychotic features: Principal | ICD-10-CM | POA: Diagnosis present

## 2013-09-29 DIAGNOSIS — R1031 Right lower quadrant pain: Secondary | ICD-10-CM

## 2013-09-29 DIAGNOSIS — F319 Bipolar disorder, unspecified: Secondary | ICD-10-CM | POA: Diagnosis present

## 2013-09-29 DIAGNOSIS — R45851 Suicidal ideations: Secondary | ICD-10-CM

## 2013-09-29 DIAGNOSIS — R51 Headache: Secondary | ICD-10-CM

## 2013-09-29 DIAGNOSIS — Z79899 Other long term (current) drug therapy: Secondary | ICD-10-CM

## 2013-09-29 DIAGNOSIS — R634 Abnormal weight loss: Secondary | ICD-10-CM

## 2013-09-29 LAB — RAPID URINE DRUG SCREEN, HOSP PERFORMED
Amphetamines: NOT DETECTED
BENZODIAZEPINES: NOT DETECTED
Barbiturates: NOT DETECTED
COCAINE: NOT DETECTED
Opiates: NOT DETECTED
Tetrahydrocannabinol: NOT DETECTED

## 2013-09-29 LAB — COMPREHENSIVE METABOLIC PANEL
ALK PHOS: 93 U/L (ref 39–117)
ALT: 13 U/L (ref 0–35)
AST: 18 U/L (ref 0–37)
Albumin: 4.4 g/dL (ref 3.5–5.2)
BUN: 14 mg/dL (ref 6–23)
CALCIUM: 9 mg/dL (ref 8.4–10.5)
CO2: 28 mEq/L (ref 19–32)
Chloride: 95 mEq/L — ABNORMAL LOW (ref 96–112)
Creatinine, Ser: 0.69 mg/dL (ref 0.50–1.10)
Glucose, Bld: 123 mg/dL — ABNORMAL HIGH (ref 70–99)
Potassium: 3.5 mEq/L — ABNORMAL LOW (ref 3.7–5.3)
Sodium: 136 mEq/L — ABNORMAL LOW (ref 137–147)
Total Bilirubin: 1.1 mg/dL (ref 0.3–1.2)
Total Protein: 8.1 g/dL (ref 6.0–8.3)

## 2013-09-29 LAB — CBC WITH DIFFERENTIAL/PLATELET
Basophils Absolute: 0 10*3/uL (ref 0.0–0.1)
Basophils Relative: 0 % (ref 0–1)
EOS ABS: 0.1 10*3/uL (ref 0.0–0.7)
EOS PCT: 2 % (ref 0–5)
HEMATOCRIT: 36 % (ref 36.0–46.0)
HEMOGLOBIN: 12.2 g/dL (ref 12.0–15.0)
LYMPHS PCT: 43 % (ref 12–46)
Lymphs Abs: 2.4 10*3/uL (ref 0.7–4.0)
MCH: 31.3 pg (ref 26.0–34.0)
MCHC: 33.9 g/dL (ref 30.0–36.0)
MCV: 92.3 fL (ref 78.0–100.0)
MONOS PCT: 7 % (ref 3–12)
Monocytes Absolute: 0.4 10*3/uL (ref 0.1–1.0)
Neutro Abs: 2.6 10*3/uL (ref 1.7–7.7)
Neutrophils Relative %: 47 % (ref 43–77)
Platelets: 254 10*3/uL (ref 150–400)
RBC: 3.9 MIL/uL (ref 3.87–5.11)
RDW: 12.8 % (ref 11.5–15.5)
WBC: 5.5 10*3/uL (ref 4.0–10.5)

## 2013-09-29 LAB — ACETAMINOPHEN LEVEL: Acetaminophen (Tylenol), Serum: <15 ug/mL (ref 10–30)

## 2013-09-29 LAB — URINE MICROSCOPIC-ADD ON

## 2013-09-29 LAB — URINALYSIS, ROUTINE W REFLEX MICROSCOPIC
BILIRUBIN URINE: NEGATIVE
GLUCOSE, UA: NEGATIVE mg/dL
Ketones, ur: 15 mg/dL — AB
Nitrite: NEGATIVE
Protein, ur: 30 mg/dL — AB
Specific Gravity, Urine: 1.016 (ref 1.005–1.030)
Urobilinogen, UA: 0.2 mg/dL (ref 0.0–1.0)
pH: 5 (ref 5.0–8.0)

## 2013-09-29 LAB — POCT PREGNANCY, URINE
Preg Test, Ur: NEGATIVE
Preg Test, Ur: NEGATIVE

## 2013-09-29 LAB — ETHANOL

## 2013-09-29 LAB — SALICYLATE LEVEL

## 2013-09-29 MED ORDER — IBUPROFEN 800 MG PO TABS: 800.0000 mg | ORAL_TABLET | Freq: Three times a day (TID) | ORAL | Status: DC

## 2013-09-29 MED ORDER — TRAMADOL HCL 50 MG PO TABS
100.0000 mg | ORAL_TABLET | Freq: Four times a day (QID) | ORAL | Status: DC | PRN
  Administered 2013-09-29 – 2013-10-03 (×9): 100 mg via ORAL
  Filled 2013-09-29 (×9): qty 2

## 2013-09-29 MED ORDER — ALUM & MAG HYDROXIDE-SIMETH 200-200-20 MG/5ML PO SUSP
30.0000 mL | ORAL | Status: DC | PRN
Start: 2013-09-29 — End: 2013-10-03

## 2013-09-29 MED ORDER — HYDROXYZINE HCL 25 MG PO TABS
25.0000 mg | ORAL_TABLET | Freq: Four times a day (QID) | ORAL | Status: DC | PRN
  Administered 2013-09-29 – 2013-09-30 (×2): 25 mg via ORAL
  Filled 2013-09-29 (×4): qty 1

## 2013-09-29 MED ORDER — PROMETHAZINE HCL 25 MG/ML IJ SOLN
25.0000 mg | Freq: Once | INTRAMUSCULAR | Status: AC
  Administered 2013-09-29: 25 mg via INTRAVENOUS
  Filled 2013-09-29: qty 1

## 2013-09-29 MED ORDER — MAGNESIUM HYDROXIDE 400 MG/5ML PO SUSP: 30.0000 mL | Freq: Every day | ORAL | Status: DC | PRN

## 2013-09-29 MED ORDER — ACETAMINOPHEN 325 MG PO TABS: 650.0000 mg | ORAL_TABLET | ORAL | Status: DC | PRN

## 2013-09-29 MED ORDER — DOCUSATE SODIUM 100 MG PO CAPS
100.0000 mg | ORAL_CAPSULE | Freq: Two times a day (BID) | ORAL | Status: DC
  Administered 2013-09-29 – 2013-10-03 (×5): 100 mg via ORAL
  Filled 2013-09-29 (×13): qty 1

## 2013-09-29 MED ORDER — NICOTINE 21 MG/24HR TD PT24: 21.0000 mg | MEDICATED_PATCH | Freq: Every day | TRANSDERMAL | Status: DC

## 2013-09-29 MED ORDER — LORAZEPAM 1 MG PO TABS
1.0000 mg | ORAL_TABLET | Freq: Three times a day (TID) | ORAL | Status: DC | PRN
Start: 2013-09-29 — End: 2013-09-29

## 2013-09-29 MED ORDER — HYDROXYZINE HCL 25 MG PO TABS
25.0000 mg | ORAL_TABLET | Freq: Once | ORAL | Status: AC
  Administered 2013-09-29: 25 mg via ORAL
  Filled 2013-09-29 (×2): qty 1

## 2013-09-29 MED ORDER — ONDANSETRON HCL 4 MG PO TABS: 4.0000 mg | ORAL_TABLET | Freq: Three times a day (TID) | ORAL | Status: DC | PRN

## 2013-09-29 MED ORDER — TRAZODONE HCL 50 MG PO TABS
50.0000 mg | ORAL_TABLET | Freq: Every day | ORAL | Status: DC
  Administered 2013-09-29: 50 mg via ORAL
  Filled 2013-09-29 (×4): qty 1

## 2013-09-29 MED ORDER — ZOLPIDEM TARTRATE 5 MG PO TABS: 5.0000 mg | ORAL_TABLET | Freq: Every evening | ORAL | Status: DC | PRN

## 2013-09-29 MED ORDER — ACETAMINOPHEN 325 MG PO TABS
650.0000 mg | ORAL_TABLET | Freq: Four times a day (QID) | ORAL | Status: DC | PRN
  Administered 2013-09-30 – 2013-10-01 (×3): 650 mg via ORAL
  Filled 2013-09-29 (×3): qty 2

## 2013-09-29 MED ORDER — ONDANSETRON HCL 4 MG/2ML IJ SOLN
4.0000 mg | Freq: Once | INTRAMUSCULAR | Status: AC
  Administered 2013-09-29: 4 mg via INTRAVENOUS
  Filled 2013-09-29: qty 2

## 2013-09-29 MED ORDER — PROMETHAZINE HCL 25 MG PO TABS: 25.0000 mg | ORAL_TABLET | Freq: Four times a day (QID) | ORAL | Status: DC | PRN

## 2013-09-29 MED ORDER — POTASSIUM CHLORIDE CRYS ER 20 MEQ PO TBCR
40.0000 meq | EXTENDED_RELEASE_TABLET | Freq: Once | ORAL | Status: AC
  Administered 2013-09-29: 40 meq via ORAL
  Filled 2013-09-29: qty 2

## 2013-09-29 MED ORDER — KETOROLAC TROMETHAMINE 30 MG/ML IJ SOLN
30.0000 mg | Freq: Once | INTRAMUSCULAR | Status: AC
  Administered 2013-09-29: 30 mg via INTRAVENOUS
  Filled 2013-09-29: qty 1

## 2013-09-29 MED ORDER — SODIUM CHLORIDE 0.9 % IV BOLUS (SEPSIS)
1000.0000 mL | Freq: Once | INTRAVENOUS | Status: AC
  Administered 2013-09-29: 1000 mL via INTRAVENOUS

## 2013-09-29 MED ORDER — LEVOFLOXACIN 500 MG PO TABS
500.0000 mg | ORAL_TABLET | Freq: Every day | ORAL | Status: DC
  Administered 2013-09-30 – 2013-10-02 (×3): 500 mg via ORAL
  Filled 2013-09-29 (×3): qty 1
  Filled 2013-09-29: qty 2
  Filled 2013-09-29 (×3): qty 1

## 2013-09-29 MED ORDER — ONDANSETRON 4 MG PO TBDP
4.0000 mg | ORAL_TABLET | Freq: Three times a day (TID) | ORAL | Status: DC | PRN
  Administered 2013-09-30: 4 mg via ORAL
  Filled 2013-09-29: qty 1
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
## 2013-09-29 NOTE — ED Notes (Signed)
Pt has flank back pain on the right side is a 7/10 in pain.  Pt is worse after urination.  Pt states she only urinates 3x daily at the most.

## 2013-09-29 NOTE — ED Notes (Signed)
Pt wants to get help with her emotions, denies SI/HI. Pt is depressed at times and happy at times. States she does take her medications but is suppose to take 4 a day but only takes 2 at bedtime

## 2013-09-29 NOTE — [facility] Counselor (Signed)
[facility]. Aundra MilletMegan NP and Julieanne CottonJosephine NP aware. Oncoming TTS will need to complete support paperwork.   Evette Cristalaroline Paige Murlin Schrieber, [place] Assessment Counselor

## 2013-09-29 NOTE — ED Notes (Addendum)
Per Ava @ [facility], Pt has been approved and has a bed.  Pt just needs medical clearance.  [facility] and labs were drawn (CBC and CMP).  Pt is SI w/ a plan.
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
## 2013-09-29 NOTE — ED Provider Notes (Signed)
CSN: [id number]     Arrival date & time 09/29/13  0308 History   First MD Initiated Contact with Patient 09/29/13 0507     Chief Complaint  Patient presents with  . Flank Pain   (Consider location/radiation/quality/duration/timing/severity/associated sxs/prior Treatment) HPI  History provided by patient. History of kidney stones, hematuria, fibromyalgia and left flank pain. [facility] and is scheduled for further studies to evaluate her hematuria. Tonight she presents with ongoing flank pain requesting pain medications.  Past Medical History  Diagnosis Date  . IBS (irritable bowel syndrome)   . Bipolar 1 disorder   . Mononucleosis   . Kidney stones   . PID (acute pelvic inflammatory disease) 04/28/2012  . Chlamydia 04/29/2012  . C. difficile diarrhea   . Anemia   . Anxiety   . Arthritis   . Depression   . Ulcer   . Abnormal Pap smear     colposcopy  . HPV in female   . Fibromyalgia    Past Surgical History  Procedure Laterality Date  . Kidney stone surgery    . Tonsillectomy    . Inner ear surgery    . Wisdom tooth extraction    . Colposcopy     Family History  Problem Relation Age of Onset  . Bipolar disorder Mother   . Cancer Father   . ADD / ADHD Brother   . Cancer Maternal Grandmother   . Cancer Maternal Grandfather   . Cancer Paternal Grandmother   . Cancer Paternal Grandfather    History  Substance Use Topics  . Smoking status: Current Every Day Smoker -- 0.50 packs/day for 10 years    Types: Cigarettes  . Smokeless tobacco: Never Used  . Alcohol Use: No   OB History   Grav Para Term Preterm Abortions TAB SAB Ect Mult Living   0              Review of Systems  Constitutional: Negative for fever and chills.  Respiratory: Negative for shortness of breath.   Cardiovascular: Negative for chest pain.  Gastrointestinal: Positive for nausea.  Genitourinary: Positive for hematuria and flank pain.  Musculoskeletal:  Negative for back pain, neck pain and neck stiffness.  Skin: Negative for rash.  Neurological: Negative for weakness and headaches.  All other systems reviewed and are negative.    Allergies  Azithromycin; Prednisone; Bactrim; Doxycycline hyclate; and Latex  Home Medications  No current outpatient prescriptions on file. BP 99/62  Pulse 99  Temp(Src) 98.3 F (36.8 C) (Oral)  Resp 18  Ht 5\' 5"  (1.651 m)  Wt 104 lb 1 oz (47.202 kg)  BMI 17.32 kg/m2  SpO2 99%  LMP 09/14/2013 Physical Exam  Constitutional: She is oriented to person, place, and time. She appears well-developed and well-nourished.  HENT:  Head: Normocephalic and atraumatic.  Mouth/Throat: Oropharynx is clear and moist.  Eyes: EOM are normal. Pupils are equal, round, and reactive to light.  Neck: Neck supple.  Cardiovascular: Normal rate, regular rhythm and intact distal pulses.   Pulmonary/Chest: Effort normal and breath sounds normal. No respiratory distress.  Abdominal: Soft. Bowel sounds are normal. She exhibits no distension. There is no rebound and no guarding.  Tender L flank, no peritonitis, no sig CVAT  Musculoskeletal: Normal range of motion. She exhibits no edema.  Neurological: She is alert and oriented to person, place, and time. No cranial nerve deficit. Coordination normal.  Skin: Skin is warm and dry.  ED Course  Procedures (including critical care time) Labs Review Labs Reviewed  URINALYSIS, ROUTINE W REFLEX MICROSCOPIC - Abnormal; Notable for the following:    Color, Urine RED (*)    APPearance CLOUDY (*)    Hgb urine dipstick LARGE (*)    Ketones, ur 15 (*)    Protein, ur 30 (*)    Leukocytes, UA MODERATE (*)    All other components within normal limits  COMPREHENSIVE METABOLIC PANEL - Abnormal; Notable for the following:    Sodium 136 (*)    Potassium 3.5 (*)    Chloride 95 (*)    Glucose, Bld 123 (*)    All other components within normal limits  CBC WITH DIFFERENTIAL  URINE  MICROSCOPIC-ADD ON  POCT PREGNANCY, URINE   Imaging Review Ct Abdomen Pelvis Wo Contrast  09/29/2013   CLINICAL DATA:  Left flank pain for 2 weeks  EXAM: CT ABDOMEN AND PELVIS WITHOUT CONTRAST  TECHNIQUE: Multidetector CT imaging of the abdomen and pelvis was performed following the standard protocol without intravenous contrast.  COMPARISON:  08/15/2013  FINDINGS: BODY WALL: Unremarkable.  LOWER CHEST: Unremarkable.  ABDOMEN/PELVIS:  Liver: No focal abnormality.  Biliary: No evidence of biliary obstruction or stone.  Pancreas: Unremarkable.  Spleen: Unremarkable.  Adrenals: Unremarkable.  Kidneys and ureters: Punctate stone in the lower pole left kidney shows no interval displacement. No hydronephrosis or ureteral calculus.  Bladder: Unremarkable.  Reproductive: Unremarkable.  Bowel: No obstruction. Formed stool present in most colonic segments. No pericecal inflammation.  Retroperitoneum: No mass or adenopathy.  Peritoneum: No free fluid or gas.  Vascular: No acute abnormality.  OSSEOUS: No acute abnormalities.  IMPRESSION: 1. No hydronephrosis or ureteral stone. 2. Punctate left lower nephrolithiasis. 3. Possible constipation.   Electronically Signed   By: Tiburcio Pea M.D.   On: 09/29/2013 05:08    IVfs, zofran and toradol provided. On recheck still nauseated and phenergan provided. I discussed with PT multiple Er visits for the same complaints, will hold narcotics for now.  She is comfortable with this plan. At 7am, her nausea was improved, she still has some pain but no acute ABD. She agrees to follow up with her urologist in [facility]. She agrees to strict return precautions.   MDM  Dx: Flank pain, hematuria  Labs, UA, CT scan as above. No obvious UTI and given h/o hematuria being worked up by urology, will send urine Cx. PT discharged home in improved condition with Rx phenergan and motrin    Sunnie Nielsen, MD 09/29/13 2314
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
## 2013-09-29 NOTE — Discharge Instructions (Signed)
Hematuria, Adult °Hematuria is blood in your urine. It can be caused by a bladder infection, kidney infection, prostate infection, kidney stone, or cancer of your urinary tract. Infections can usually be treated with medicine, and a kidney stone usually will pass through your urine. If neither of these is the cause of your hematuria, further workup to find out the reason may be needed. °It is very important that you tell your health care provider about any blood you see in your urine, even if the blood stops without treatment or happens without causing pain. Blood in your urine that happens and then stops and then happens again can be a symptom of a very serious condition. Also, pain is not a symptom in the initial stages of many urinary cancers. °HOME CARE INSTRUCTIONS  °· Drink lots of fluid, 3 4 quarts a day. If you have been diagnosed with an infection, cranberry juice is especially recommended, in addition to large amounts of water. °· Avoid caffeine, tea, and carbonated beverages, because they tend to irritate the bladder. °· Avoid alcohol because it may irritate the prostate. °· Only take over-the-counter or prescription medicines for pain, discomfort, or fever as directed by your health care provider. °· If you have been diagnosed with a kidney stone, follow your health care provider's instructions regarding straining your urine to catch the stone. °· Empty your bladder often. Avoid holding urine for long periods of time. °· After a bowel movement, women should cleanse front to back. Use each tissue only once. °· Empty your bladder before and after sexual intercourse if you are a female. °SEEK MEDICAL CARE IF: °You develop back pain, fever, a feeling of sickness in your stomach (nausea), or vomiting or if your symptoms are not better in 3 days. Return sooner if you are getting worse. °SEEK IMMEDIATE MEDICAL CARE IF:  °· You have a persistent fever, with a temperature of 101.8°F (38.8°C) or greater. °· You  develop severe vomiting and are unable to keep the medicine down. °· You develop severe back or abdominal pain despite taking your medicines. °· You begin passing a large amount of blood or clots in your urine. °· You feel extremely weak or faint, or you pass out. °MAKE SURE YOU:  °· Understand these instructions. °· Will watch your condition. °· Will get help right away if you are not doing well or get worse. °Document Released: 09/04/2005 Document Revised: 06/25/2013 Document Reviewed: 05/05/2013 °ExitCare® Patient Information ©2014 [employer]. ° °
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
## 2013-09-29 NOTE — ED Notes (Signed)
Pt. reports left flank pain for 2 weeks , denies dysuria or hematuria , pt. also stated frequent episodes of panic attack for the past several days . Denies suicidal ideation .

## 2013-09-29 NOTE — Progress Notes (Signed)
Pt did not attend group due to her completing her admission at the time with her nurse.

## 2013-09-30 DIAGNOSIS — F411 Generalized anxiety disorder: Secondary | ICD-10-CM | POA: Diagnosis present

## 2013-09-30 DIAGNOSIS — F319 Bipolar disorder, unspecified: Secondary | ICD-10-CM

## 2013-09-30 LAB — TSH: TSH: 1.715 u[IU]/mL (ref 0.350–4.500)

## 2013-09-30 MED ORDER — ZOLPIDEM TARTRATE 5 MG PO TABS
5.0000 mg | ORAL_TABLET | Freq: Every evening | ORAL | Status: DC | PRN
  Administered 2013-09-30 – 2013-10-02 (×3): 5 mg via ORAL
  Filled 2013-09-30 (×3): qty 1

## 2013-09-30 MED ORDER — CLONAZEPAM 0.5 MG PO TABS
0.5000 mg | ORAL_TABLET | Freq: Two times a day (BID) | ORAL | Status: DC
  Administered 2013-09-30 – 2013-10-01 (×2): 0.5 mg via ORAL
  Filled 2013-09-30 (×3): qty 1

## 2013-09-30 MED ORDER — ESCITALOPRAM OXALATE 10 MG PO TABS
10.0000 mg | ORAL_TABLET | Freq: Every day | ORAL | Status: DC
  Administered 2013-09-30 – 2013-10-01 (×2): 10 mg via ORAL
  Filled 2013-09-30 (×5): qty 1

## 2013-09-30 NOTE — Progress Notes (Signed)
Recreation Therapy Notes  Animal-Assisted Activity/Therapy (AAA/T) Program Checklist/Progress Notes Patient Eligibility Criteria Checklist & Daily Group note for Rec Tx Intervention  Date: 01.13.2015 Time: 2:45pm Location: [address] Dayroom   AAA/T Program Assumption of Risk Form signed by Patient/ or Parent Legal Guardian yes  Patient is free of allergies or sever asthma yes  Patient reports no fear of animals yes  Patient reports no history of cruelty to animals yes   Patient understands his/her participation is voluntary yes  Patient washes hands before animal contact yes  Patient washes hands after animal contact yes  Behavioral Response: Appropriate   Education: Hand Washing, Appropriate Animal Interaction   Education Outcome: Acknowledges understanding   Clinical Observations/Feedback: Patient interacted appropriately with therapy dog team, peers and LRT.   Tonya Davidson L Lucendia Leard, LRT/CTRS  Stephaine Breshears L 09/30/2013 5:29 PM 
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
## 2013-09-30 NOTE — Progress Notes (Signed)
The focus of this group is to educate the patient on the purpose and policies of crisis stabilization and provide a format to answer questions about their admission.  The group details unit policies and expectations of patients while admitted.  Patient did not attend 0900 nurse education orientation group this morning.  Patient stayed in bed sleeping.    
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
## 2013-09-30 NOTE — Progress Notes (Signed)
D: Patient in the dayroom on approach.  Patient fixated on her medications.  Patient appears anxious ad restless.  Patient states sh needs more Klonopin.  Patient complains of back pain due to kidney stones and states she does not think enough is being done about it.  Patient denis SI/HI and denies AVH. A: Staff to monitor Q 15 mins for safety.  Encouragement and support offered.  Scheduled medications administered per orders.  Vistaril administered for anxiety prn and Ambien administered prn for sleep. R: Patient remains safe on the unit.  Patient attended group tonight.  Patient visible on the unit and interacting with peers.  Patient taking administered medications.
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
## 2013-09-30 NOTE — [facility] Suicide Risk Assessment (Signed)
[facility] INPATIENT:  Family/Significant Other Suicide Prevention Education  Suicide Prevention Education:  Education Completed; Tonya Davidson, Mother, [facility] [name] [phone];  has been identified by the patient as the family member/significant other with whom the patient will be residing, and identified as the person(s) who will aid the patient in the event of a mental health crisis (suicidal ideations/suicide attempt).  With written consent from the patient, the family member/significant other has been provided the following suicide prevention education, prior to the and/or following the discharge of the patient.  The suicide prevention education provided includes the following:  Suicide risk factors  Suicide prevention and interventions  National Suicide Hotline telephone number  [facility] assessment telephone number  [facility] Emergency Assistance 911  [facility] and/or Residential Mobile Crisis Unit telephone number  Request made of family/significant other to:  Remove weapons (e.g., guns, rifles, knives), all items previously/currently identified as safety concern.  Mother advised patient does not have access to guns.  Remove drugs/medications (over-the-counter, prescriptions, illicit drugs), all items previously/currently identified as a safety concern.  The family member/significant other verbalizes understanding of the suicide prevention education information provided.  The family member/significant other agrees to remove the items of safety concern listed above.  Tonya Davidson 09/30/2013, 1:16 PM

## 2013-09-30 NOTE — [facility] Group Notes (Signed)
[facility] LCSW Group Therapy      Feelings About Diagnosis 1:15 - 2:30 PM         09/30/2013  3:07 PM    Type of Therapy:  Group Therapy  Participation Level:  Active  Participation Quality:  Appropriate  Affect:  Appropriate  Cognitive:  Alert and Appropriate  Insight:  Developing/Improving and Engaged  Engagement in Therapy:  Developing/Improving and Engaged  Modes of Intervention:  Discussion, Education, Exploration, Problem-Solving, Rapport Building, Support  Summary of Progress/Problems:  Patient actively participated in group. Patient discussed past and present diagnosis and the effects it has had on  life.  Patient talked about family and society being judgmental and the stigma associated with having a mental health diagnosis.  She shared she accepts her bipolar diagnosis but does not like the feeling of being out of control.  Wynn BankerHodnett, Tonya Davidson 09/30/2013  3:07 PM

## 2013-09-30 NOTE — Progress Notes (Signed)
D:  Per pt self inventory pt reports ing fair, appetitegood , energy level normal, ability to pay attention good, rates depression at 1 out of 10 and hopelessness at  0 out of 10.  A:  Emotional support provided, Encouraged pt to continue with treatment plan and attend all group activities, q15 min checks maintained for safety.  R:  Pt is receptive, attends groups, calm and cooperative with staff and other patients.  Plans to move out of her boyfriend's apartment after discharge and to stay happy and positive as ways to improve taking care of herself.
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
## 2013-10-01 MED ORDER — METHOCARBAMOL 500 MG PO TABS
500.0000 mg | ORAL_TABLET | Freq: Three times a day (TID) | ORAL | Status: DC | PRN
  Administered 2013-10-01: 500 mg via ORAL
  Filled 2013-10-01: qty 1

## 2013-10-01 MED ORDER — ONDANSETRON HCL 4 MG PO TABS
4.0000 mg | ORAL_TABLET | Freq: Three times a day (TID) | ORAL | Status: DC | PRN
  Administered 2013-10-01: 4 mg via ORAL
  Filled 2013-10-01: qty 1

## 2013-10-01 MED ORDER — CLONAZEPAM 0.5 MG PO TABS
0.5000 mg | ORAL_TABLET | Freq: Two times a day (BID) | ORAL | Status: DC | PRN
  Administered 2013-10-01 – 2013-10-02 (×2): 0.5 mg via ORAL
  Filled 2013-10-01 (×2): qty 1

## 2013-10-01 NOTE — Progress Notes (Signed)
NUTRITION ASSESSMENT  Pt identified as at risk on the Malnutrition Screen Tool  INTERVENTION: 1. Educated patient on the importance of nutrition and encouraged intake of food and beverages. 2. Discussed weight goals. 3. Supplements: none at this time.  NUTRITION DIAGNOSIS: Unintentional weight loss related to sub-optimal intake as evidenced by pt report.   Goal: Pt to meet >/= 90% of their estimated nutrition needs.  Monitor:  PO intake  Assessment:  Patient admitted with increased symptoms of depression, anxiety with SI.  HX of IBS and fibromyalgia per patient.    Currently in bed with a migraine and complains of pain like kidney stones as well.  Has not been able to eat today and is feeling nauseated.  Reports that she ate yesterday.  Was not eating well prior to admit secondary to stress over the past 2 months.  Patient has lost from 112 lbs to 106 lbs.  5% of weight.  Does not tolerate Ensure type supplements  23 y.o. female  Height: Ht Readings from Last 1 Encounters:  09/29/13 5\' 2"  (1.575 m)    Weight: Wt Readings from Last 1 Encounters:  09/29/13 106 lb (48.081 kg)    Weight Hx: Wt Readings from Last 10 Encounters:  09/29/13 106 lb (48.081 kg)  09/29/13 104 lb 1 oz (47.202 kg)  09/10/13 102 lb (46.267 kg)  04/28/13 107 lb (48.535 kg)  01/04/13 105 lb 6.4 oz (47.809 kg)  10/20/12 121 lb (54.885 kg)  09/08/12 106 lb (48.081 kg)  07/20/12 106 lb 3.2 oz (48.172 kg)  04/29/12 107 lb (48.535 kg)  04/11/12 107 lb (48.535 kg)    BMI:  Body mass index is 19.38 kg/(m^2). Pt meets criteria for normal weight based on current BMI.  Estimated Nutritional Needs: Kcal: 25-30 kcal/kg Protein: > 1 gram protein/kg Fluid: 1 ml/kcal  Diet Order: General Pt is also offered choice of unit snacks mid-morning and mid-afternoon.  Pt is eating as desired.   Lab results and medications reviewed.   Oran ReinLaura Jobe, RD, LDN Clinical Inpatient Dietitian Pager:  [phone] Weekend  and after hours pager:  [phone]
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
## 2013-10-01 NOTE — Progress Notes (Signed)
Recreation Therapy Notes  Date: 01.14.2015 Time: 3:00 Location: 500 [address] Dayroom   Group Topic: Communication, Team Building, Problem Solving  Goal Area(s) Addresses:  Patient will effectively work with peer towards shared goal.  Patient will identify skill used to make activity successful.  Patient will identify how skills used during activity can be used to reach post d/c goals.   Behavioral Response: Engaged, Attentive, Appropriate   Intervention: Problem Solving Activitiy  Activity: Life Boat. Patients were given a scenario about being on a sinking yacht. Patients were informed the yacht included 15 guest, 8 of which could be placed on the life boat, along with all group members. Individuals on guest list were of varying socioeconomic classes such as a Education officer, museumriest, Materials engineerresident Obama, MidwifeBus Driver, Tree surgeonTeacher and Chef.   Education: Pharmacist, communityocial Skills, Discharge Planning   Education Outcome: Acknowledges understanding  Clinical Observations/Feedback: Patient arrived to group session at approximately 3:15pm. At approximately 3:20pm group session was ended due to group member showing signs of seizure like behavior.   Marykay Lexenise L Terrilee Dudzik, LRT/CTRS  Danyella Mcginty L 10/01/2013 5:07 PM

## 2013-10-01 NOTE — [facility] Group Notes (Signed)
[facility]   10/01/2013 1:09 PM    Participation Quality:  Appropraite  Mood/Affect:  Appropriate  Depression Rating:  0  Anxiety Rating:  7  Thoughts of Suicide:  No  Will you contract for safety?   NA  Current AVH:  No  Plan for Discharge/Comments:  Patient attended discharge planning group and actively participated in group. She advised of feeling better today.  [facility]are. CSW provided all participants with daily workbook.   Transportation Means: Patient has transportation.   Supports:  Patient has a support system.   Sonji Starkes, Joesph JulyQuylle Hairston
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
## 2013-10-01 NOTE — Progress Notes (Signed)
Pt has been in bed since before lunch c/o pain headache and left flank pain but declined the need for any medications.  She was given a ice pack.  She rated both her depression and hopelessness a 1 and her anxiety a 7 on her self-inventory.  Pt has not requested any prn meds or new orders noted thus far.

## 2013-10-01 NOTE — [facility] Group Notes (Signed)
Adult Psychoeducational Group Note  Date:  10/01/2013 Time:  10:43 PM  Group Topic/Focus:  Wrap-Up Group:   The focus of this group is to help patients review their daily goal of treatment and discuss progress on daily workbooks.  Participation Level:  Active  Participation Quality:  Appropriate  Affect:  Appropriate  Cognitive:  Appropriate  Insight: Appropriate  Engagement in Group:  Engaged  Modes of Intervention:  Discussion  Additional Comments:  [name] stated her day started off with her having a headache because she doesn't have her glasses.  Other than that, she said her day was awesome.  She is looking forward to her freedom once discharged.  Caroll RancherLindsay, Degan Hanser A 10/01/2013, 10:43 PM
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
## 2013-10-01 NOTE — [facility] Group Notes (Signed)
[facility] LCSW Group Therapy  Emotional Regulation 1:15 - 2: 30 PM        10/01/2013  3:33 PM    Type of Therapy:  Group Therapy  Participation Level: Did not attend group.  Wynn BankerHodnett, Summerlyn Fickel Hairston 10/01/2013 3:33 PM

## 2013-10-02 MED ORDER — BUPROPION HCL ER (XL) 150 MG PO TB24
150.0000 mg | ORAL_TABLET | Freq: Every day | ORAL | Status: DC
  Administered 2013-10-02 – 2013-10-03 (×2): 150 mg via ORAL
  Filled 2013-10-02 (×5): qty 1

## 2013-10-02 MED ORDER — CLONAZEPAM 0.5 MG PO TABS
0.5000 mg | ORAL_TABLET | Freq: Three times a day (TID) | ORAL | Status: DC | PRN
  Administered 2013-10-02 – 2013-10-03 (×2): 0.5 mg via ORAL
  Filled 2013-10-02: qty 1

## 2013-10-02 NOTE — Progress Notes (Signed)
Adult Psychoeducational Group Note  Date:  10/02/2013 Time:  10:25 PM  Group Topic/Focus:  Goals Group:   The focus of this group is to help patients establish daily goals to achieve during treatment and discuss how the patient can incorporate goal setting into their daily lives to aide in recovery.  Participation Level:  Active  Participation Quality:  Appropriate  Affect:  Appropriate  Cognitive:  Appropriate  Insight: Appropriate  Engagement in Group:  Engaged  Modes of Intervention:  Discussion  Additional Comments:  Pt stated that she had a great day, and hope that tomorrow is better.  Terie PurserParker, Calder Oblinger R 10/02/2013, 10:25 PM

## 2013-10-02 NOTE — Progress Notes (Addendum)
D:  Patient's self inventory sheet, patient has poor sleep, good appetite, normal energy level, good attention span.  Denied depression and hopeless.  Denied SI.  Had headache in past 24 hours.  Pain goal 4, worst pain 7.  Plans to continue meds after discharge.  Lexapro causes headaches.  Does have discharge plans.  No problems taking meds after discharge. A:  Refused lexapro and colace this morning.  MD informed. R:  Denied SI and HI.  Denied A/V hallucinations.  Will continue to monitor patient for safety with 15 minute checks.  Safety maintained.

## 2013-10-02 NOTE — [facility] Group Notes (Signed)
[facility] LCSW Group Therapy  10/02/2013  1:15 PM   Type of Therapy:  Group Therapy  Participation Level:  Active  Participation Quality:  Attentive, Sharing and Supportive  Affect:  Bright  Cognitive:  Alert and Oriented  Insight:  Developing/Improving and Engaged  Engagement in Therapy:  Developing/Improving and Engaged  Modes of Intervention:  Activity, Clarification, Confrontation, Discussion, Education, Exploration, Limit-setting, Orientation, Problem-solving, Rapport Building, Dance movement psychotherapisteality Testing, Socialization and Support  Summary of Progress/Problems: Patient was attentive and engaged with speaker from [employer].  Patient was attentive to speaker while they shared their story of dealing with mental health and overcoming it.  Patient expressed interest in their programs and services and received information on their agency.  Patient processed ways they can relate to the speaker.     Reyes IvanChelsea Horton, LCSW 10/02/2013 1:43 PM

## 2013-10-02 NOTE — Progress Notes (Signed)
Recreation Therapy Notes  Animal-Assisted Activity/Therapy (AAA/T) Program Checklist/Progress Notes Patient Eligibility Criteria Checklist & Daily Group note for Rec Tx Intervention  Date: 01.15.2015 Time: 2:45pm Location: 500 hall dayroom    AAA/T Program Assumption of Risk Form signed by Patient/ or Parent Legal Guardian yes  Patient is free of allergies or sever asthma yes  Patient reports no fear of animals yes  Patient reports no history of cruelty to animals yes   Patient understands his/her participation is voluntary yes  Patient washes hands before animal contact yes  Patient washes hands after animal contact yes  Behavioral Response: Engaged, Approrpaite  Education: Charity fundraiserHand Washing, Appropriate Animal Interaction   Education Outcome: Acknowledges understanding   Clinical Observations/Feedback: Patient interacted appropriately with therapy dog team, peers and LRT.    Marykay Lexenise L Michae Grimley, LRT/CTRS  Jearl KlinefelterBlanchfield, Evi Mccomb L 10/02/2013 4:39 PM
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
## 2013-10-02 NOTE — Progress Notes (Signed)
The focus of this group is to educate the patient on the purpose and policies of crisis stabilization and provide a format to answer questions about their admission.  The group details unit policies and expectations of patients while admitted.  Patient attended 0900 nurse education orientation group this morning.  Patient actively participated, appropriate affect, alert, appropriate insight and engagement.  Today patient will work on 3 goals for discharge.  

## 2013-10-02 NOTE — Progress Notes (Signed)
Patient attended groups, has been cooperative/pleasant on unit.

## 2013-10-02 NOTE — Consult Note (Signed)
Case discussed, agree with plan 

## 2013-10-02 NOTE — Progress Notes (Signed)
Adult Psychoeducational Group Note  Date:  10/02/2013 Time:  5:05 PM  Group Topic/Focus:  Rediscovering Joy:   The focus of this group is to explore various ways to relieve stress in a positive manner and how to rediscover joy and laughter in their life.  Participation Level:  Active  Participation Quality:  Appropriate  Affect:  Appropriate  Cognitive:  Appropriate  Insight: Appropriate  Engagement in Group:  Engaged  Modes of Intervention:  Discussion and Support  Additional Comments:  Pts discussed things that bring them joy and make them laugh. Pt stated she needs to get back into drawing because it makes her happy and brings her joy.  Caswell CorwinOwen, Fadi Menter C 10/02/2013, 5:05 PM

## 2013-10-03 DIAGNOSIS — F332 Major depressive disorder, recurrent severe without psychotic features: Secondary | ICD-10-CM | POA: Diagnosis present

## 2013-10-03 MED ORDER — BUPROPION HCL ER (XL) 150 MG PO TB24: 150.0000 mg | ORAL_TABLET | Freq: Every day | ORAL | Status: DC

## 2013-10-03 MED ORDER — TRAMADOL HCL 50 MG PO TABS
100.0000 mg | ORAL_TABLET | Freq: Four times a day (QID) | ORAL | Status: DC | PRN
Start: 2013-10-03 — End: 2013-10-03

## 2013-10-03 MED ORDER — ZOLPIDEM TARTRATE 5 MG PO TABS: 5.0000 mg | ORAL_TABLET | Freq: Every evening | ORAL | Status: DC | PRN

## 2013-10-03 MED ORDER — DSS 100 MG PO CAPS: 100.0000 mg | ORAL_CAPSULE | Freq: Two times a day (BID) | ORAL | Status: DC

## 2013-10-03 MED ORDER — METHOCARBAMOL 500 MG PO TABS: 500.0000 mg | ORAL_TABLET | Freq: Three times a day (TID) | ORAL | Status: DC | PRN

## 2013-10-03 MED ORDER — TRAMADOL HCL 50 MG PO TABS: 100.0000 mg | ORAL_TABLET | Freq: Four times a day (QID) | ORAL | Status: DC | PRN

## 2013-10-03 MED ORDER — CLONAZEPAM 0.5 MG PO TABS
ORAL_TABLET | ORAL | Status: AC
  Filled 2013-10-03: qty 1

## 2013-10-03 MED ORDER — CLONAZEPAM 0.5 MG PO TABS: 0.5000 mg | ORAL_TABLET | Freq: Three times a day (TID) | ORAL | Status: DC | PRN

## 2013-10-03 MED ORDER — CLONAZEPAM 1 MG PO TABS
ORAL_TABLET | ORAL | Status: DC
Start: 2013-10-03 — End: 2013-10-03
  Filled 2013-10-03: qty 1

## 2013-10-03 NOTE — Tx Team (Signed)
Interdisciplinary Treatment Plan Update   Date Reviewed:  10/03/2013  Time Reviewed:  8:25 AM  Progress in Treatment:   Attending groups: Yes Participating in groups: Yes Taking medication as prescribed: Yes  Tolerating medication: Yes Family/Significant other contact made: Yes, contact made with mother Patient understands diagnosis: Yes  Discussing patient identified problems/goals with staff: Yes Medical problems stabilized or resolved: Yes Denies suicidal/homicidal ideation: Yes Patient has not harmed self or others: Yes  For review of initial/current patient goals, please see plan of care.  Estimated Length of Stay:  Discharge today  Reasons for Continued Hospitalization:   New Problems/Goals identified:    Discharge Plan or Barriers:   Home with [facility]  Additional Comments:    Attendees:  Patient: Tonya DonathLindsay Skousen 10/03/2013 8:25 AM   Signature: Mervyn GayJ. Jonnalagadda, MD 10/03/2013 8:25 AM  Signature:   10/03/2013 8:25 AM  Signature:  Quintella ReichertBeverly Knight, RN 10/03/2013 8:25 AM  Signature:   10/03/2013 8:25 AM  Signature:  10/03/2013 8:25 AM  Signature:  Horace PorteousQuylle Ajla Mcgeachy, LCSW 10/03/2013 8:25 AM  Signature:  Reyes Ivanhelsea Horton, LCSW 10/03/2013 8:25 AM  Signature:  Leisa LenzValerie Enoch, Care Coordinator 10/03/2013 8:25 AM  Signature:  10/03/2013 8:25 AM  Signature 10/03/2013  8:25 AM  Signature:   Onnie BoerJennifer Clark, RN URCM 10/03/2013  8:25 AM  Signature:   10/03/2013  8:25 AM    Scribe for Treatment Team:   Juline PatchQuylle Dale Ribeiro,  10/03/2013 8:25 AM

## 2013-10-03 NOTE — Progress Notes (Signed)
D: Pt presents with an anxious mood this evening. Pt is currently denying any depression this evening. She reports feeling good and ready to possibly go home on Friday. She denies having any suicidal ideation. She reports that she basically needed a break from stressors in her life such as school and her Bf who told her to stop taking any Benzos. Pt present for group.  A: Writer administered scheduled and prn medications to pt. Continued support and availability as needed was extended to this pt. Staff continue to monitor pt with q6215min checks.  R: No adverse drug reactions noted. Pt receptive to treatment. Pt remains safe at this time.

## 2013-10-03 NOTE — Progress Notes (Signed)
[facility] Discharge Plan :  Will you be returning to the same living situation after discharge: No.  Patient reports discharging to mother's home. At discharge, do you have transportation home?:Yes,  Patient to arrange transportation home. Do you have the ability to pay for your medications:Yes,  Patient has insurance.  Release of information consent forms completed and in the chart;  Patient's signature needed at discharge.  Patient to Follow up at: Follow-up Information   [facility] On 10/29/2013. (February 11 at 12:30 PM)    Contact information:   [address] [place], [place]   [id number]  [phone]      Follow up with Boneta [facility]Jennifer Brown [facility] On 10/20/2013. (Monday, October 20, 2012 a 1:30.  Please arrive at 1:00PM with completed registration form.  You have been placed on the cancellation list for an earlier appointment should one come available.)    Contact information:   [address] [place]      Patient denies SI/HI:   Patient no longer endorsing SI/HI or other thoughts of self harm.  Safety Planning and Suicide Prevention discussed: .Reviewed with all patients during discharge planning group  Nova Schmuhl, Joesph JulyQuylle Hairston 10/03/2013, 10:28 AM

## 2013-10-03 NOTE — [facility] Group Notes (Signed)
[facility] LCSW [facility] Discharge Planning Group Note   10/03/2013 9:35 AM    Participation Quality:  Appropraite  Mood/Affect:  Appropriate  Depression Rating:  0  Anxiety Rating:  0  Thoughts of Suicide:  No  Will you contract for safety?   NA  Current AVH:  No  Plan for Discharge/Comments:  Patient attended discharge planning group and actively participated in group.  She reports doing well and hopes to discharge today.  She will [facility].  CSW provided all participants with daily workbook.   Transportation Means: Patient has transportation.   Supports:  Patient has a support system.   Caidance Sybert, Joesph JulyQuylle Hairston

## 2013-10-03 NOTE — Discharge Instructions (Signed)
Mood Disorders  Mood disorders are conditions that affect the way a person feels emotionally. The main mood disorders include:  · Depression.  · Bipolar disorder.  · Dysthymia. Dysthymia is a mild, lasting (chronic) depression. Symptoms of dysthymia are similar to depression, but not as severe.  · Cyclothymia. Cyclothymia includes mood swings, but the highs and lows are not as severe as they are in bipolar disorder. Symptoms of cyclothymia are similar to those of bipolar disorder, but less extreme.  CAUSES   Mood disorders are probably caused by a combination of factors. People with mood disorders seem to have physical and chemical changes in their brains. Mood disorders run in families, so there may be genetic causes. Severe trauma or stressful life events may also increase the risk of mood disorders.   SYMPTOMS   Symptoms of mood disorders depend on the specific type of condition.  Depression symptoms include:  · Feeling sad, worthless, or hopeless.  · Negative thoughts.  · Inability to enjoy one's usual activities.  · Low energy.  · Sleeping too much or too little.  · Appetite changes.  · Crying.  · Concentration problems.  · Thoughts of harming oneself.  Bipolar disorder symptoms include:  · Periods of depression (see above symptoms).  · Mood swings, from sadness and depression, to abnormal elation and excitement.  · Periods of mania:  · Racing thoughts.  · Fast speech.  · Poor judgment, and careless, dangerous choices.  · Decreased need for sleep.  · Risky behavior.  · Difficulty concentrating.  · Irritability.  · Increased energy.  · Increased sex drive.  DIAGNOSIS   There are no blood tests or X-rays that can confirm a mood disorder. However, your caregiver may choose to run some tests to make sure that there is not another physical cause for your symptoms. A mood disorder is usually diagnosed after an in-depth interview with a caregiver.  TREATMENT   Mood disorders can be treated with one or more of the  following:  · Medicine. This may include antidepressants, mood-stabilizers, or anti-psychotics.  · Psychotherapy (talk therapy).  · Cognitive behavioral therapy. You are taught to recognize negative thoughts and behavior patterns, and replace them with healthy thoughts and behaviors.  · Electroconvulsive therapy. For very severe cases of deep depression, a series of treatments in which an electrical current is applied to the brain.  · Vagus nerve stimulation. A pulse of electricity is applied to a portion of the brain.  · Transcranial magnetic stimulation. Powerful magnets are placed on the head that produce electrical currents.  · Hospitalization. In severe situations, or when someone is having serious thoughts of harming him or herself, hospitalization may be necessary in order to keep the person safe. This is also done to quickly start and monitor treatment.  HOME CARE INSTRUCTIONS   · Take your medicine exactly as directed.  · Attend all of your therapy sessions.  · Try to eat regular, healthy meals.  · Exercise daily. Exercise may improve mood symptoms.  · Get good sleep.  · Do not drink alcohol or use pot or other drugs. These can worsen mood symptoms and cause anxiety and psychosis.  · Tell your caregiver if you develop any side effects, such as feeling sick to your stomach (nauseous), dry mouth, dizziness, constipation, drowsiness, tremor, weight gain, or sexual symptoms. He or she may suggest things you can do to improve symptoms.  · Learn ways to cope with the stress of having a   chronic illness. This includes yoga, meditation, tai chi, or participating in a support group.  · Drink enough water to keep your urine clear or pale yellow. Eat a high-fiber diet. These habits may help you avoid constipation from your medicine.  SEEK IMMEDIATE MEDICAL CARE IF:  · Your mood worsens.  · You have thoughts of hurting yourself or others.  · You cannot care for yourself.  · You develop the sensation of hearing or seeing  something that is not actually present (auditory or visual hallucinations).  · You develop abnormal thoughts.  Document Released: 07/02/2009 Document Revised: 11/27/2011 Document Reviewed: 07/02/2009  [employer]® Patient Information ©2014 [employer].
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
## 2013-10-03 NOTE — Progress Notes (Signed)
Adult Psychoeducational Group Note  Date:  10/03/2013 Time:  10:00am Group Topic/Focus:  Relapse Prevention Planning:   The focus of this group is to define relapse and discuss the need for planning to combat relapse.  Participation Level:  Active  Participation Quality:  Appropriate and Attentive  Affect:  Appropriate  Cognitive:  Alert and Appropriate  Insight: Appropriate  Engagement in Group:  Engaged  Modes of Intervention:  Discussion and Education  Additional Comments:  Pt attended and participated in group. Discussion was on recovery prevention and what would help in not relasping. Pt stated what would help is to be able to get refills on her meds and stop trying to please her boyfriend.  Shelly BombardGarner, Pearson Reasons D 10/03/2013, 3:14 PM

## 2013-10-06 ENCOUNTER — Encounter (HOSPITAL_COMMUNITY): Payer: Self-pay | Admitting: Emergency Medicine

## 2013-10-06 ENCOUNTER — Emergency Department (HOSPITAL_COMMUNITY)
Admission: EM | Admit: 2013-10-06 | Discharge: 2013-10-06 | Disposition: A | Discharge status: Home / Self Care | Payer: Managed Care, Other (non HMO) | Attending: Emergency Medicine | Admitting: Emergency Medicine

## 2013-10-06 DIAGNOSIS — Z8742 Personal history of other diseases of the female genital tract: Secondary | ICD-10-CM | POA: Insufficient documentation

## 2013-10-06 DIAGNOSIS — R000 Tachycardia, unspecified: Secondary | ICD-10-CM | POA: Insufficient documentation

## 2013-10-06 DIAGNOSIS — F319 Bipolar disorder, unspecified: Secondary | ICD-10-CM | POA: Insufficient documentation

## 2013-10-06 DIAGNOSIS — W19XXXA Unspecified fall, initial encounter: Secondary | ICD-10-CM

## 2013-10-06 DIAGNOSIS — G40909 Epilepsy, unspecified, not intractable, without status epilepticus: Secondary | ICD-10-CM | POA: Insufficient documentation

## 2013-10-06 DIAGNOSIS — Z79899 Other long term (current) drug therapy: Secondary | ICD-10-CM | POA: Insufficient documentation

## 2013-10-06 DIAGNOSIS — W108XXA Fall (on) (from) other stairs and steps, initial encounter: Secondary | ICD-10-CM | POA: Insufficient documentation

## 2013-10-06 DIAGNOSIS — F17200 Nicotine dependence, unspecified, uncomplicated: Secondary | ICD-10-CM | POA: Insufficient documentation

## 2013-10-06 DIAGNOSIS — S0181XA Laceration without foreign body of other part of head, initial encounter: Secondary | ICD-10-CM

## 2013-10-06 DIAGNOSIS — Z87442 Personal history of urinary calculi: Secondary | ICD-10-CM | POA: Insufficient documentation

## 2013-10-06 DIAGNOSIS — Z91040 Latex allergy status: Secondary | ICD-10-CM | POA: Insufficient documentation

## 2013-10-06 DIAGNOSIS — W1809XA Striking against other object with subsequent fall, initial encounter: Secondary | ICD-10-CM | POA: Insufficient documentation

## 2013-10-06 DIAGNOSIS — Z862 Personal history of diseases of the blood and blood-forming organs and certain disorders involving the immune mechanism: Secondary | ICD-10-CM | POA: Insufficient documentation

## 2013-10-06 DIAGNOSIS — Z8619 Personal history of other infectious and parasitic diseases: Secondary | ICD-10-CM | POA: Insufficient documentation

## 2013-10-06 DIAGNOSIS — Z872 Personal history of diseases of the skin and subcutaneous tissue: Secondary | ICD-10-CM | POA: Insufficient documentation

## 2013-10-06 DIAGNOSIS — Y939 Activity, unspecified: Secondary | ICD-10-CM | POA: Insufficient documentation

## 2013-10-06 DIAGNOSIS — S0180XA Unspecified open wound of other part of head, initial encounter: Secondary | ICD-10-CM | POA: Insufficient documentation

## 2013-10-06 DIAGNOSIS — Z8739 Personal history of other diseases of the musculoskeletal system and connective tissue: Secondary | ICD-10-CM | POA: Insufficient documentation

## 2013-10-06 DIAGNOSIS — Y929 Unspecified place or not applicable: Secondary | ICD-10-CM | POA: Insufficient documentation

## 2013-10-06 DIAGNOSIS — Z8719 Personal history of other diseases of the digestive system: Secondary | ICD-10-CM | POA: Insufficient documentation

## 2013-10-06 DIAGNOSIS — F411 Generalized anxiety disorder: Secondary | ICD-10-CM | POA: Insufficient documentation

## 2013-10-06 MED ORDER — HYDROMORPHONE HCL PF 1 MG/ML IJ SOLN
1.0000 mg | Freq: Once | INTRAMUSCULAR | Status: AC
  Administered 2013-10-06: 1 mg via INTRAMUSCULAR
  Filled 2013-10-06: qty 1

## 2013-10-06 NOTE — ED Notes (Signed)
Pt fell down stairs tonight, she was [place] in [place] and states they didn't do anything for her. Pt states she thinks she was "out" for several minutes.

## 2013-10-06 NOTE — ED Notes (Signed)
Patient is alert and oriented x3.  She was given DC instructions and follow up visit instructions.  Patient gave verbal understanding. She was DC ambulatory under her own power to home.  V/S stable.  He was not showing any signs of distress on DC 

## 2013-10-06 NOTE — ED Provider Notes (Signed)
CSN: [id number]     Arrival date & time 10/06/13  0152 History   First MD Initiated Contact with Patient 10/06/13 0154     Chief Complaint  Patient presents with  . Head Laceration   (Consider location/radiation/quality/duration/timing/severity/associated sxs/prior Treatment) HPI Comments: Vision presents with a laceration to her for head, with bleeding down her face to her chest.  She, states, that she fell down 4 steps hitting her for head.  On concrete.  This happened earlier and she was seen at a hospital in [place].  She thinks no one really did a CT of her head.  Put her in a neck brace discharge, her with instructions for concussion, but refused to suture her laceration.  The friend with her and the rim.  States she picked her up at her home and she insisted on coming to this emergency department.  She agrees to let us send for records from no violent so that repeat x-rays are not performed. She states her last tetanus shot was 3 or 4 months ago  Patient is a 23 y.o. female presenting with scalp laceration. The history is provided by the patient.  Head Laceration This is a new problem. The current episode started today.    Past Medical History  Diagnosis Date  . IBS (irritable bowel syndrome)   . Bipolar 1 disorder   . Mononucleosis   . Kidney stones   . PID (acute pelvic inflammatory disease) 04/28/2012  . Chlamydia 04/29/2012  . C. difficile diarrhea   . Anemia   . Anxiety   . Arthritis   . Depression   . Ulcer   . Abnormal Pap smear     colposcopy  . HPV in female   . Fibromyalgia   . Seizures     October 2014 was last one    Past Surgical History  Procedure Laterality Date  . Kidney stone surgery    . Tonsillectomy    . Inner ear surgery    . Wisdom tooth extraction    . Colposcopy     Family History  Problem Relation Age of Onset  . Bipolar disorder Mother   . Cancer Father   . ADD / ADHD Brother   . Cancer Maternal Grandmother   . Cancer Maternal  Grandfather   . Cancer Paternal Grandmother   . Cancer Paternal Grandfather    History  Substance Use Topics  . Smoking status: Current Every Day Smoker -- 0.50 packs/day for 5 years    Types: Cigarettes  . Smokeless tobacco: Never Used  . Alcohol Use: No   OB History   Grav Para Term Preterm Abortions TAB SAB Ect Mult Living   0              Review of Systems  Allergies  Azithromycin; Prednisone; Bactrim; Doxycycline hyclate; Ibuprofen; and Latex  Home Medications   Current Outpatient Rx  Name  Route  Sig  Dispense  Refill  . buPROPion (WELLBUTRIN XL) 150 MG 24 hr tablet   Oral   Take 1 tablet (150 mg total) by mouth daily.   30 tablet   0   . clonazePAM (KLONOPIN) 0.5 MG tablet   Oral   Take 1 tablet (0.5 mg total) by mouth 3 (three) times daily as needed (anxiety).   14 tablet   0   . Docusate Sodium (DSS) 100 MG CAPS   Oral   Take 100 mg by mouth 2 (two) times daily.   60 each  0   . methocarbamol (ROBAXIN) 500 MG tablet   Oral   Take 1 tablet (500 mg total) by mouth every 8 (eight) hours as needed for muscle spasms.   30 tablet   0   . traMADol (ULTRAM) 50 MG tablet   Oral   Take 2 tablets (100 mg total) by mouth every 6 (six) hours as needed for severe pain.   15 tablet   0   . zolpidem (AMBIEN) 5 MG tablet   Oral   Take 1 tablet (5 mg total) by mouth at bedtime as needed for sleep.   30 tablet   0    BP 113/71  Pulse 103  Temp(Src) 98.3 F (36.8 C) (Oral)  Resp 18  SpO2 99%  LMP 09/14/2013 Physical Exam  Constitutional: She is oriented to person, place, and time. She appears well-developed and well-nourished.  HENT:  Head: Normocephalic and atraumatic.    Right Ear: External ear normal.  Left Ear: External ear normal.  Mouth/Throat:    Eyes: Pupils are equal, round, and reactive to light.  Neck: Normal range of motion.  Cardiovascular: Regular rhythm.  Tachycardia present.   Pulmonary/Chest: Effort normal.  Musculoskeletal:  Normal range of motion.  Neurological: She is alert and oriented to person, place, and time.  Skin: Skin is warm. No erythema.    ED Course  LACERATION REPAIR Date/Time: 10/06/2013 3:33 AM Performed by: Arman FilterSCHULZ, Onaje Warne K Authorized by: Arman FilterSCHULZ, Aidyn Sportsman K Consent: Verbal consent obtained. written consent not obtained. Risks and benefits: risks, benefits and alternatives were discussed Consent given by: patient Patient understanding: patient states understanding of the procedure being performed Patient identity confirmed: verbally with patient Body area: head/neck Laceration length: 1.5 cm Foreign bodies: no foreign bodies Tendon involvement: none Vascular damage: no Patient sedated: no Preparation: Patient was prepped and draped in the usual sterile fashion. Irrigation solution: saline Skin closure: glue Patient tolerance: Patient tolerated the procedure well with no immediate complications.   (including critical care time) Labs Review Labs Reviewed - No data to display Imaging Review No results found.  EKG Interpretation   None       MDM   1. Facial laceration   2. Fall     After wounds have been cleaned.  They appear to be quite superficial not requiring sutures.  I recommended.  Dermabond.  The patient is insisting on stitches, Dr. Rhunette CroftNanavati has been consult if for further examination  [facility]  Patient had head, face, and cervical spine CT scan, abd/pelvis CT Scan,  shoulder and wrist xray--- all with negative results Arrival wasapx  2100 and DC was 2300 According to the timeline presents by the patient and her "friend" at the bedside.  Its been more than 4 hours since her, fall.  She's had no episodes of vomiting, or change in mental status. Dermabond has been applied to her forehead laceration  Patient has been encouraged to fill the prescription.  She received yesterday, including Ultram , clonazepam, Ambien, and  Robaxin  Arman FilterGail K Garison Genova, NP 10/06/13 [phone]

## 2013-10-06 NOTE — Discharge Instructions (Signed)
Make sure she filled the prescriptions that were [facility] to [facility] including Ultram, Klonopin, Robaxin and Ambien

## 2013-10-06 NOTE — ED Provider Notes (Signed)
Medical screening examination/treatment/procedure(s) were conducted as a shared visit with non-physician practitioner(s) and myself.  I personally evaluated the patient during the encounter.  EKG Interpretation   None      Pt comes in with fall - seen at outside hospital at around 9:30, got imaged and discharged - she comes to the ED with laceration as cc. Has a superficial laceration to the forehead, about 2 cm in length, linear, not gaping and not bleeding. Dermabon applied with steri strips. Lip laceration also superficial - no food impaction risk, and explained that it shall heal overtime.   Derwood KaplanAnkit Shamonique Battiste, MD 10/06/13 [phone]

## 2013-10-07 ENCOUNTER — Encounter (INDEPENDENT_AMBULATORY_CARE_PROVIDER_SITE_OTHER): Payer: Self-pay

## 2013-10-07 ENCOUNTER — Ambulatory Visit (INDEPENDENT_AMBULATORY_CARE_PROVIDER_SITE_OTHER): Payer: Managed Care, Other (non HMO) | Admitting: Psychiatry

## 2013-10-07 ENCOUNTER — Encounter (HOSPITAL_COMMUNITY): Payer: Self-pay | Admitting: Psychiatry

## 2013-10-07 VITALS — BP 102/70 | HR 105 | Ht 65.0 in | Wt 103.2 lb

## 2013-10-07 DIAGNOSIS — F411 Generalized anxiety disorder: Secondary | ICD-10-CM

## 2013-10-07 DIAGNOSIS — F332 Major depressive disorder, recurrent severe without psychotic features: Secondary | ICD-10-CM
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
## 2013-10-08 NOTE — Progress Notes (Signed)
Patient Discharge Instructions:  Next Level Care Provider Has Access to the EMR, 10/08/13 Records [facility] via [facility]/Epic access.  Jerelene ReddenSheena E [place], 10/08/2013, 1:24 PM

## 2013-10-13 ENCOUNTER — Encounter (HOSPITAL_COMMUNITY): Payer: Self-pay | Admitting: *Deleted

## 2013-10-13 ENCOUNTER — Encounter (HOSPITAL_COMMUNITY): Payer: Self-pay | Admitting: Emergency Medicine

## 2013-10-13 ENCOUNTER — Emergency Department (HOSPITAL_COMMUNITY): Payer: Managed Care, Other (non HMO)

## 2013-10-13 ENCOUNTER — Ambulatory Visit (HOSPITAL_COMMUNITY)
Admission: RE | Admit: 2013-10-13 | Discharge: 2013-10-13 | Disposition: A | Discharge status: Home / Self Care | Payer: Managed Care, Other (non HMO) | Attending: Psychiatry | Admitting: Psychiatry

## 2013-10-13 ENCOUNTER — Emergency Department (HOSPITAL_COMMUNITY)
Admission: EM | Admit: 2013-10-13 | Discharge: 2013-10-13 | Disposition: A | Discharge status: Home / Self Care | Payer: Managed Care, Other (non HMO) | Attending: Emergency Medicine | Admitting: Emergency Medicine

## 2013-10-13 DIAGNOSIS — F319 Bipolar disorder, unspecified: Secondary | ICD-10-CM | POA: Diagnosis not present

## 2013-10-13 DIAGNOSIS — F411 Generalized anxiety disorder: Secondary | ICD-10-CM | POA: Diagnosis not present

## 2013-10-13 DIAGNOSIS — M129 Arthropathy, unspecified: Secondary | ICD-10-CM | POA: Insufficient documentation

## 2013-10-13 DIAGNOSIS — F17200 Nicotine dependence, unspecified, uncomplicated: Secondary | ICD-10-CM | POA: Insufficient documentation

## 2013-10-13 DIAGNOSIS — Z8619 Personal history of other infectious and parasitic diseases: Secondary | ICD-10-CM | POA: Insufficient documentation

## 2013-10-13 DIAGNOSIS — R1031 Right lower quadrant pain: Secondary | ICD-10-CM | POA: Diagnosis present

## 2013-10-13 DIAGNOSIS — Z8742 Personal history of other diseases of the female genital tract: Secondary | ICD-10-CM | POA: Insufficient documentation

## 2013-10-13 DIAGNOSIS — G40909 Epilepsy, unspecified, not intractable, without status epilepticus: Secondary | ICD-10-CM | POA: Diagnosis not present

## 2013-10-13 DIAGNOSIS — Z79899 Other long term (current) drug therapy: Secondary | ICD-10-CM | POA: Diagnosis not present

## 2013-10-13 DIAGNOSIS — Z91040 Latex allergy status: Secondary | ICD-10-CM | POA: Insufficient documentation

## 2013-10-13 DIAGNOSIS — K589 Irritable bowel syndrome without diarrhea: Secondary | ICD-10-CM | POA: Diagnosis not present

## 2013-10-13 DIAGNOSIS — N898 Other specified noninflammatory disorders of vagina: Secondary | ICD-10-CM | POA: Diagnosis not present

## 2013-10-13 DIAGNOSIS — R000 Tachycardia, unspecified: Secondary | ICD-10-CM | POA: Diagnosis not present

## 2013-10-13 DIAGNOSIS — Z3202 Encounter for pregnancy test, result negative: Secondary | ICD-10-CM | POA: Diagnosis not present

## 2013-10-13 DIAGNOSIS — R109 Unspecified abdominal pain: Secondary | ICD-10-CM

## 2013-10-13 LAB — LIPASE, BLOOD: Lipase: 24 U/L (ref 11–59)

## 2013-10-13 LAB — CBC WITH DIFFERENTIAL/PLATELET
BASOS ABS: 0 10*3/uL (ref 0.0–0.1)
BASOS PCT: 1 % (ref 0–1)
Eosinophils Absolute: 0.1 10*3/uL (ref 0.0–0.7)
Eosinophils Relative: 3 % (ref 0–5)
HCT: 34.2 % — ABNORMAL LOW (ref 36.0–46.0)
HEMOGLOBIN: 11.3 g/dL — AB (ref 12.0–15.0)
Lymphocytes Relative: 33 % (ref 12–46)
Lymphs Abs: 1.8 10*3/uL (ref 0.7–4.0)
MCH: 30.5 pg (ref 26.0–34.0)
MCHC: 33 g/dL (ref 30.0–36.0)
MCV: 92.4 fL (ref 78.0–100.0)
MONOS PCT: 6 % (ref 3–12)
Monocytes Absolute: 0.3 10*3/uL (ref 0.1–1.0)
NEUTROS PCT: 57 % (ref 43–77)
Neutro Abs: 3.1 10*3/uL (ref 1.7–7.7)
Platelets: 250 10*3/uL (ref 150–400)
RBC: 3.7 MIL/uL — ABNORMAL LOW (ref 3.87–5.11)
RDW: 12.8 % (ref 11.5–15.5)
WBC: 5.5 10*3/uL (ref 4.0–10.5)

## 2013-10-13 LAB — URINALYSIS, ROUTINE W REFLEX MICROSCOPIC
Bilirubin Urine: NEGATIVE
GLUCOSE, UA: NEGATIVE mg/dL
Ketones, ur: NEGATIVE mg/dL
Nitrite: NEGATIVE
PH: 8 (ref 5.0–8.0)
Protein, ur: NEGATIVE mg/dL
SPECIFIC GRAVITY, URINE: 1.009 (ref 1.005–1.030)
Urobilinogen, UA: 0.2 mg/dL (ref 0.0–1.0)

## 2013-10-13 LAB — WET PREP, GENITAL
Clue Cells Wet Prep HPF POC: NONE SEEN
TRICH WET PREP: NONE SEEN
Yeast Wet Prep HPF POC: NONE SEEN

## 2013-10-13 LAB — COMPREHENSIVE METABOLIC PANEL
ALBUMIN: 3.9 g/dL (ref 3.5–5.2)
ALK PHOS: 91 U/L (ref 39–117)
ALT: 31 U/L (ref 0–35)
AST: 35 U/L (ref 0–37)
BILIRUBIN TOTAL: 0.3 mg/dL (ref 0.3–1.2)
BUN: 8 mg/dL (ref 6–23)
CHLORIDE: 100 meq/L (ref 96–112)
CO2: 25 mEq/L (ref 19–32)
Calcium: 8.8 mg/dL (ref 8.4–10.5)
Creatinine, Ser: 0.68 mg/dL (ref 0.50–1.10)
GFR calc Af Amer: >90 mL/min (ref >90)
GFR calc non Af Amer: >90 mL/min (ref >90)
Glucose, Bld: 92 mg/dL (ref 70–99)
Potassium: 3.9 mEq/L (ref 3.7–5.3)
SODIUM: 139 meq/L (ref 137–147)
Total Protein: 7.5 g/dL (ref 6.0–8.3)

## 2013-10-13 LAB — URINE MICROSCOPIC-ADD ON

## 2013-10-13 LAB — GC/CHLAMYDIA PROBE AMP
CT PROBE, AMP APTIMA: NEGATIVE
GC PROBE AMP APTIMA: NEGATIVE

## 2013-10-13 LAB — POCT PREGNANCY, URINE: PREG TEST UR: NEGATIVE

## 2013-10-13 LAB — RPR: RPR Ser Ql: NONREACTIVE

## 2013-10-13 MED ORDER — OXYCODONE-ACETAMINOPHEN 5-325 MG PO TABS
1.0000 | ORAL_TABLET | Freq: Once | ORAL | Status: AC
  Administered 2013-10-13: 1 via ORAL
  Filled 2013-10-13: qty 1

## 2013-10-13 MED ORDER — ONDANSETRON HCL 4 MG/2ML IJ SOLN
4.0000 mg | Freq: Once | INTRAMUSCULAR | Status: AC
  Administered 2013-10-13: 4 mg via INTRAVENOUS
  Filled 2013-10-13: qty 2

## 2013-10-13 MED ORDER — OXYCODONE-ACETAMINOPHEN 5-325 MG PO TABS
1.0000 | ORAL_TABLET | Freq: Once | ORAL | Status: AC
Start: 2013-10-13 — End: 2013-10-13
  Administered 2013-10-13: 1 via ORAL
  Filled 2013-10-13: qty 1

## 2013-10-13 MED ORDER — MORPHINE SULFATE 4 MG/ML IJ SOLN
4.0000 mg | Freq: Once | INTRAMUSCULAR | Status: AC
  Administered 2013-10-13: 4 mg via INTRAVENOUS
  Filled 2013-10-13: qty 1

## 2013-10-13 MED ORDER — ONDANSETRON 4 MG PO TBDP: 4.0000 mg | ORAL_TABLET | Freq: Three times a day (TID) | ORAL | Status: DC | PRN

## 2013-10-13 MED ORDER — CEFTRIAXONE SODIUM 250 MG IJ SOLR: 250.0000 mg | Freq: Once | INTRAMUSCULAR | Status: DC

## 2013-10-13 MED ORDER — IOHEXOL 300 MG/ML  SOLN
80.0000 mL | Freq: Once | INTRAMUSCULAR | Status: AC | PRN
  Administered 2013-10-13: 80 mL via INTRAVENOUS

## 2013-10-13 MED ORDER — CEFTRIAXONE SODIUM 500 MG IJ SOLR
250.0000 mg | Freq: Once | INTRAMUSCULAR | Status: AC
  Administered 2013-10-13: 250 mg via INTRAVENOUS
  Filled 2013-10-13: qty 250

## 2013-10-13 MED ORDER — DOXYCYCLINE HYCLATE 100 MG PO CAPS: 100.0000 mg | ORAL_CAPSULE | Freq: Two times a day (BID) | ORAL | Status: DC

## 2013-10-13 MED ORDER — IOHEXOL 300 MG/ML  SOLN
50.0000 mL | Freq: Once | INTRAMUSCULAR | Status: AC | PRN
  Administered 2013-10-13: 50 mL via ORAL

## 2013-10-13 NOTE — ED Provider Notes (Signed)
CSN: [id number]     Arrival date & time 10/13/13  0636 History   First MD Initiated Contact with Patient 10/13/13 [phone]     Chief Complaint  Patient presents with  . Abdominal Pain   (Consider location/radiation/quality/duration/timing/severity/associated sxs/prior Treatment) HPI  This a 26 are old female with history of IBS, bipolar disorder, kidney stones, PID and history of C. Diff who presents with right lower quadrant pain and diarrhea.  Patient reports an eight-day history of watery diarrhea. She reports that it is nonbloody. She has associated nonbilious, nonbloody emesis. Patient also reports a three-day history of right lower quadrant pain that is sharp and radiates to her right flank. She currently rates her pain at 8 at 10. She states this is progressively worse. She states it feels similar to when she had C. difficile; however she wasn't vomiting is much. Patient denies any urinary symptoms. She does report scant vaginal discharge.  She reports fever on the second day of illness to 101. Since then has been afebrile. She denies any other symptoms.  LMP 6 days ago.  Past Medical History  Diagnosis Date  . IBS (irritable bowel syndrome)   . Bipolar 1 disorder   . Mononucleosis   . Kidney stones   . PID (acute pelvic inflammatory disease) 04/28/2012  . Chlamydia 04/29/2012  . C. difficile diarrhea   . Anemia   . Anxiety   . Arthritis   . Depression   . Ulcer   . Abnormal Pap smear     colposcopy  . HPV in female   . Fibromyalgia   . Seizures     October 2014 was last one    Past Surgical History  Procedure Laterality Date  . Kidney stone surgery    . Tonsillectomy    . Inner ear surgery    . Wisdom tooth extraction    . Colposcopy     Family History  Problem Relation Age of Onset  . Bipolar disorder Mother   . Cancer Father   . ADD / ADHD Brother   . Cancer Maternal Grandmother   . Cancer Maternal Grandfather   . Cancer Paternal Grandmother   . Cancer Paternal  Grandfather    History  Substance Use Topics  . Smoking status: Current Every Day Smoker -- 0.50 packs/day for 5 years    Types: Cigarettes  . Smokeless tobacco: Never Used  . Alcohol Use: No   OB History   Grav Para Term Preterm Abortions TAB SAB Ect Mult Living   0              Review of Systems  Constitutional: Positive for fever.  Respiratory: Negative for cough, chest tightness and shortness of breath.   Cardiovascular: Negative for chest pain.  Gastrointestinal: Positive for nausea, vomiting, abdominal pain and diarrhea. Negative for blood in stool.  Genitourinary: Negative for dysuria.  Musculoskeletal: Negative for back pain.  Skin: Negative for wound.  Neurological: Negative for headaches.  Psychiatric/Behavioral: Negative for confusion.  All other systems reviewed and are negative.    Allergies  Azithromycin; Prednisone; Bactrim; Doxycycline hyclate; Ibuprofen; Latex; and Toradol  Home Medications   Current Outpatient Rx  Name  Route  Sig  Dispense  Refill  . acetaminophen (TYLENOL) 325 MG tablet   Oral   Take 650 mg by mouth every 6 (six) hours as needed.         Marland Kitchen buPROPion (WELLBUTRIN XL) 150 MG 24 hr tablet   Oral  Take 1 tablet (150 mg total) by mouth daily.   30 tablet   0   . clonazePAM (KLONOPIN) 0.5 MG tablet   Oral   Take 1 tablet (0.5 mg total) by mouth 3 (three) times daily as needed (anxiety).   14 tablet   0   . Docusate Sodium (DSS) 100 MG CAPS   Oral   Take 100 mg by mouth 2 (two) times daily.   60 each   0   . levofloxacin (LEVAQUIN) 500 MG tablet   Oral   Take 500 mg by mouth daily.         . doxycyclinMarland Kitchene (VIBRAMYCIN) 100 MG capsule   Oral   Take 1 capsule (100 mg total) by mouth 2 (two) times daily.   20 capsule   0   . methocarbamol (ROBAXIN) 500 MG tablet   Oral   Take 1 tablet (500 mg total) by mouth every 8 (eight) hours as needed for muscle spasms.   30 tablet   0   . ondansetron (ZOFRAN ODT) 4 MG  disintegrating tablet   Oral   Take 1 tablet (4 mg total) by mouth every 8 (eight) hours as needed for nausea or vomiting.   20 tablet   0   . traMADol (ULTRAM) 50 MG tablet   Oral   Take 2 tablets (100 mg total) by mouth every 6 (six) hours as needed for severe pain.   15 tablet   0   . zolpidem (AMBIEN) 5 MG tablet   Oral   Take 1 tablet (5 mg total) by mouth at bedtime as needed for sleep.   30 tablet   0    BP 99/66  Pulse 95  Temp(Src) 98 F (36.7 C) (Oral)  Resp 16  Ht 5\' 7"  (1.702 m)  Wt 106 lb (48.081 kg)  BMI 16.60 kg/m2  SpO2 100%  LMP 09/14/2013 Physical Exam  Nursing note and vitals reviewed. Constitutional: She is oriented to person, place, and time. She appears well-developed and well-nourished. No distress.  HENT:  Head: Normocephalic and atraumatic.  Mucous membranes dry  Eyes: Pupils are equal, round, and reactive to light.  Neck: Neck supple.  Cardiovascular: Regular rhythm and normal heart sounds.   Tachycardia  Pulmonary/Chest: Effort normal. No respiratory distress. She has no wheezes.  Abdominal: Soft. Bowel sounds are normal. There is tenderness. There is no rebound and no guarding.  Right lower quadrant tenderness without rebound or guarding  Genitourinary: Vagina normal and uterus normal.  Scant vaginal discharge, right adnexal tenderness, also cervical motion tenderness  Neurological: She is alert and oriented to person, place, and time.  Skin: Skin is warm and dry.  Psychiatric: She has a normal mood and affect.    ED Course  Procedures (including critical care time) Labs Review Labs Reviewed  WET PREP, GENITAL - Abnormal; Notable for the following:    WBC, Wet Prep HPF POC FEW (*)    All other components within normal limits  CBC WITH DIFFERENTIAL - Abnormal; Notable for the following:    RBC 3.70 (*)    Hemoglobin 11.3 (*)    HCT 34.2 (*)    All other components within normal limits  URINALYSIS, ROUTINE W REFLEX MICROSCOPIC -  Abnormal; Notable for the following:    APPearance CLOUDY (*)    Hgb urine dipstick LARGE (*)    Leukocytes, UA TRACE (*)    All other components within normal limits  CLOSTRIDIUM DIFFICILE BY PCR  GC/CHLAMYDIA PROBE AMP  COMPREHENSIVE METABOLIC PANEL  LIPASE, BLOOD  RPR  URINE MICROSCOPIC-ADD ON  POCT PREGNANCY, URINE   Imaging Review US Transvaginal Non-ob  10/13/2013   CLINICAL DATA:  Pelvic pain  EXAM: TRANSABDOMINAL AND TRANSVAGINAL ULTRASOUND OF PELVIS  TECHNIQUE: Both transabdominal and transvaginal ultrasound examinations of the pelvis were performed. Transabdominal technique was performed for global imaging of the pelvis including uterus, ovaries, adnexal regions, and pelvic cul-de-sac. It was necessary to proceed with endovaginal exam following the transabdominal exam to visualize the uterus, endometrium, ovaries, and adnexal structures.  COMPARISON:  Pelvic ultrasound dated October 18, 2012  FINDINGS: Uterus  Measurements: 4.9 x 2.6 x 3.7 cm. No fibroids or other mass visualized.  Endometrium  Thickness: 3.6 mm.  Right ovary  Measurements: 3.4 x 1.9 x 2.2 cm. There are multiple simple appearing cysts of varying sizes measuring less than 2 cm in diameter.  Left ovary  Measurements: 3.8 x 1.6 x 1.9 cm. There are multiple simple appearing cysts of varying sizes measuring less than 2 cm in diameter.  Other findings  There is a trace of free fluid in the cul de sac.  IMPRESSION: 1. The uterus and endometrium are normal in appearance. 2. The ovaries demonstrate multiple small cysts of varying sizes. No solid masses are demonstrated. 3. There is a trace of free fluid within the cul-de-sac.   Electronically Signed   By: David  [place]   On: 10/13/2013 10:37   US Pelvis Complete  10/13/2013   CLINICAL DATA:  Pelvic pain  EXAM: TRANSABDOMINAL AND TRANSVAGINAL ULTRASOUND OF PELVIS  TECHNIQUE: Both transabdominal and transvaginal ultrasound examinations of the pelvis were performed. Transabdominal  technique was performed for global imaging of the pelvis including uterus, ovaries, adnexal regions, and pelvic cul-de-sac. It was necessary to proceed with endovaginal exam following the transabdominal exam to visualize the uterus, endometrium, ovaries, and adnexal structures.  COMPARISON:  Pelvic ultrasound dated October 18, 2012  FINDINGS: Uterus  Measurements: 4.9 x 2.6 x 3.7 cm. No fibroids or other mass visualized.  Endometrium  Thickness: 3.6 mm.  Right ovary  Measurements: 3.4 x 1.9 x 2.2 cm. There are multiple simple appearing cysts of varying sizes measuring less than 2 cm in diameter.  Left ovary  Measurements: 3.8 x 1.6 x 1.9 cm. There are multiple simple appearing cysts of varying sizes measuring less than 2 cm in diameter.  Other findings  There is a trace of free fluid in the cul de sac.  IMPRESSION: 1. The uterus and endometrium are normal in appearance. 2. The ovaries demonstrate multiple small cysts of varying sizes. No solid masses are demonstrated. 3. There is a trace of free fluid within the cul-de-sac.   Electronically Signed   By: David  [place]   On: 10/13/2013 10:37   Ct Abdomen Pelvis W Contrast  10/13/2013   CLINICAL DATA:  History of urinary tract stones now with right lower quadrant pain with nausea and vomiting and diarrhea, history of C difficile colitis.  EXAM: CT ABDOMEN AND PELVIS WITH CONTRAST  TECHNIQUE: Multidetector CT imaging of the abdomen and pelvis was performed using the standard protocol following bolus administration of intravenous contrast.  CONTRAST:  80mL OMNIPAQUE IOHEXOL 300 MG/ML  SOLN  COMPARISON:  None.  FINDINGS: The liver exhibits no focal mass. There is mild intrahepatic ductal dilation. The gallbladder is adequately distended with no evidence of stones or surrounding inflammatory change. The spleen, nondistended stomach, pancreas, and adrenal glands are normal in appearance. The  kidneys enhance well with no evidence of obstruction. The caliber of the  abdominal aorta is normal. There is no periaortic or pericaval lymphadenopathy.  The orally administered contrast is traversed much of the small bowel but has not yet reached the colon. There is no evidence of a small bowel obstruction or ileus. The stool and gas pattern within the colon is within the limits of normal. There is no evidence of colitis or diverticulitis. The appendix is not discretely identified.  Within the pelvis the partially distended urinary bladder is normal in appearance. The uterus is situated to the right of midline. There is mild fullness in the adnexal region likely reflecting small adnexal cysts. There is no free fluid in the pelvis. There is no inguinal nor umbilical hernia.  The lumbar vertebral bodies are preserved in height. Mild disc bulging posteriorly is noted at L4-5 and L5-S1. The bony pelvis is normal in appearance. The lung bases exhibit no acute abnormalities.  IMPRESSION: 1. There is mild intrahepatic ductal dilation of uncertain etiology. No discrete pancreatic mass is demonstrated. The common bile duct exhibits a gentle tapering caliber through the head of the pancreas. No gallstones are demonstrated. 2. There is no evidence of acute urinary tract abnormality. 3. The oral contrast has traversed only portions of the small bowel. There are no findings to suggest enteritis or colitis or diverticulitis. 4. There is mild soft tissue fullness in the adnexal regions which likely reflect small cystic adnexal processes.   Electronically Signed   By: David  [place]   On: 10/13/2013 13:08    EKG Interpretation   None       MDM   1. Abdominal pain    Patient presents with abdominal pain. He is nontoxic and comfortable appearing on exam. Initial vital signs notable for a pulse of 112. Patient was given pain medication and nausea medication. Initial lab workup is reassuring.  No exam notable for right adnexal tenderness and cervical motion tenderness. Patient has opted to be  treated for [facility] and Chlamydia. Patient was given Rocephin. Given abdominal pain and cervical motion tenderness, will treat patient with doxycycline for PID.  Ultrasound ordered and is normal.  10:50 AM Patient with persistent right lower quadrant pain. Next workup negative including ultrasound to evaluate ovaries. Patient does not have any leukocytosis. Exam is largely unremarkable. Discussed with patient possibility of this being appendicitis; however, it is an atypical presentation. Will order Ct.  Patient continues to call out to the nurses requesting pain medication. She inquired about the last time she got pain medication so she would know "what time to ask for more." She appears comfortable on multiple evaluations and appears to have some drug-seeking tendencies.  CT scan is negative. Patient has been unable to produce a stool sample. Patient will be discharged with followup with her gastroenterologist.  After history, exam, and medical workup I feel the patient has been appropriately medically screened and is safe for discharge home. Pertinent diagnoses were discussed with the patient. Patient was given return precautions.    Shon Baton, MD 10/13/13 [phone]
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
## 2013-10-13 NOTE — ED Notes (Signed)
Pt is c/o RLQ pain  Pt states she has had nausea, vomiting, and diarrhea  Pt states she has had diarrhea x 4 days  Pt has hx of c diff

## 2013-10-13 NOTE — Discharge Instructions (Signed)
Abdominal Pain, Women °Abdominal (stomach, pelvic, or belly) pain can be caused by many things. It is important to tell your doctor: °· The location of the pain. °· Does it come and go or is it present all the time? °· Are there things that start the pain (eating certain foods, exercise)? °· Are there other symptoms associated with the pain (fever, nausea, vomiting, diarrhea)? °All of this is helpful to know when trying to find the cause of the pain. °CAUSES  °· Stomach: virus or bacteria infection, or ulcer. °· Intestine: appendicitis (inflamed appendix), regional ileitis (Crohn's disease), ulcerative colitis (inflamed colon), irritable bowel syndrome, diverticulitis (inflamed diverticulum of the colon), or cancer of the stomach or intestine. °· Gallbladder disease or stones in the gallbladder. °· Kidney disease, kidney stones, or infection. °· Pancreas infection or cancer. °· Fibromyalgia (pain disorder). °· Diseases of the female organs: °· Uterus: fibroid (non-cancerous) tumors or infection. °· Fallopian tubes: infection or tubal pregnancy. °· Ovary: cysts or tumors. °· Pelvic adhesions (scar tissue). °· Endometriosis (uterus lining tissue growing in the pelvis and on the pelvic organs). °· Pelvic congestion syndrome (female organs filling up with blood just before the menstrual period). °· Pain with the menstrual period. °· Pain with ovulation (producing an egg). °· Pain with an IUD (intrauterine device, birth control) in the uterus. °· Cancer of the female organs. °· Functional pain (pain not caused by a disease, may improve without treatment). °· Psychological pain. °· Depression. °DIAGNOSIS  °Your doctor will decide the seriousness of your pain by doing an examination. °· Blood tests. °· X-rays. °· Ultrasound. °· CT scan (computed tomography, special type of X-ray). °· MRI (magnetic resonance imaging). °· Cultures, for infection. °· Barium enema (dye inserted in the large intestine, to better view it with  X-rays). °· Colonoscopy (looking in intestine with a lighted tube). °· Laparoscopy (minor surgery, looking in abdomen with a lighted tube). °· Major abdominal exploratory surgery (looking in abdomen with a large incision). °TREATMENT  °The treatment will depend on the cause of the pain.  °· Many cases can be observed and treated at home. °· Over-the-counter medicines recommended by your caregiver. °· Prescription medicine. °· Antibiotics, for infection. °· Birth control pills, for painful periods or for ovulation pain. °· Hormone treatment, for endometriosis. °· Nerve blocking injections. °· Physical therapy. °· Antidepressants. °· Counseling with a psychologist or psychiatrist. °· Minor or major surgery. °HOME CARE INSTRUCTIONS  °· Do not take laxatives, unless directed by your caregiver. °· Take over-the-counter pain medicine only if ordered by your caregiver. Do not take aspirin because it can cause an upset stomach or bleeding. °· Try a clear liquid diet (broth or water) as ordered by your caregiver. Slowly move to a bland diet, as tolerated, if the pain is related to the stomach or intestine. °· Have a thermometer and take your temperature several times a day, and record it. °· Bed rest and sleep, if it helps the pain. °· Avoid sexual intercourse, if it causes pain. °· Avoid stressful situations. °· Keep your follow-up appointments and tests, as your caregiver orders. °· If the pain does not go away with medicine or surgery, you may try: °· Acupuncture. °· Relaxation exercises (yoga, meditation). °· Group therapy. °· Counseling. °SEEK MEDICAL CARE IF:  °· You notice certain foods cause stomach pain. °· Your home care treatment is not helping your pain. °· You need stronger pain medicine. °· You want your IUD removed. °· You feel faint or   lightheaded. °· You develop nausea and vomiting. °· You develop a rash. °· You are having side effects or an allergy to your medicine. °SEEK IMMEDIATE MEDICAL CARE IF:  °· Your  pain does not go away or gets worse. °· You have a fever. °· Your pain is felt only in portions of the abdomen. The right side could possibly be appendicitis. The left lower portion of the abdomen could be colitis or diverticulitis. °· You are passing blood in your stools (bright red or black tarry stools, with or without vomiting). °· You have blood in your urine. °· You develop chills, with or without a fever. °· You pass out. °MAKE SURE YOU:  °· Understand these instructions. °· Will watch your condition. °· Will get help right away if you are not doing well or get worse. °Document Released: 07/02/2007 Document Revised: 11/27/2011 Document Reviewed: 07/22/2009 °[employer]® Patient Information ©2014 [employer]. ° °

## 2013-10-13 NOTE — ED Notes (Signed)
Pt requesting pain medication.  MD notified.

## 2013-10-13 NOTE — ED Notes (Signed)
[facility] Pt was treated in ED for pain this morning and discharged. Pt SI, reports it is not safe for her to be alone.

## 2013-10-13 NOTE — Progress Notes (Signed)
[facility] CL provided pt with a list of primary care resources, ACA information, and a [facility] Orange Card application.  °

## 2013-10-13 NOTE — ED Notes (Signed)
MD made aware that Pt is ready for pelvic exam.

## 2013-10-13 NOTE — ED Notes (Signed)
MD at bedside. 

## 2013-10-13 NOTE — ED Notes (Signed)
Patient transported to Ultrasound 

## 2013-10-20 ENCOUNTER — Ambulatory Visit (HOSPITAL_COMMUNITY): Payer: Self-pay | Admitting: Psychiatry

## 2013-10-21 ENCOUNTER — Telehealth (HOSPITAL_COMMUNITY): Payer: Self-pay

## 2013-10-21 ENCOUNTER — Ambulatory Visit (HOSPITAL_COMMUNITY): Payer: Self-pay | Admitting: Psychiatry

## 2013-10-29 ENCOUNTER — Ambulatory Visit (HOSPITAL_COMMUNITY): Payer: Self-pay | Admitting: Psychiatry

## 2013-12-23 ENCOUNTER — Emergency Department (HOSPITAL_COMMUNITY)
Admission: EM | Admit: 2013-12-23 | Discharge: 2013-12-23 | Disposition: A | Discharge status: Home / Self Care | Payer: Managed Care, Other (non HMO) | Attending: Emergency Medicine | Admitting: Emergency Medicine

## 2013-12-23 ENCOUNTER — Encounter (HOSPITAL_COMMUNITY): Payer: Self-pay | Admitting: Emergency Medicine

## 2013-12-23 DIAGNOSIS — Z87442 Personal history of urinary calculi: Secondary | ICD-10-CM | POA: Insufficient documentation

## 2013-12-23 DIAGNOSIS — A499 Bacterial infection, unspecified: Secondary | ICD-10-CM | POA: Insufficient documentation

## 2013-12-23 DIAGNOSIS — Z3202 Encounter for pregnancy test, result negative: Secondary | ICD-10-CM | POA: Insufficient documentation

## 2013-12-23 DIAGNOSIS — G8929 Other chronic pain: Secondary | ICD-10-CM | POA: Insufficient documentation

## 2013-12-23 DIAGNOSIS — Z8739 Personal history of other diseases of the musculoskeletal system and connective tissue: Secondary | ICD-10-CM | POA: Insufficient documentation

## 2013-12-23 DIAGNOSIS — F17200 Nicotine dependence, unspecified, uncomplicated: Secondary | ICD-10-CM | POA: Insufficient documentation

## 2013-12-23 DIAGNOSIS — Z8719 Personal history of other diseases of the digestive system: Secondary | ICD-10-CM | POA: Insufficient documentation

## 2013-12-23 DIAGNOSIS — Z8669 Personal history of other diseases of the nervous system and sense organs: Secondary | ICD-10-CM | POA: Insufficient documentation

## 2013-12-23 DIAGNOSIS — Z8659 Personal history of other mental and behavioral disorders: Secondary | ICD-10-CM | POA: Insufficient documentation

## 2013-12-23 DIAGNOSIS — N731 Chronic parametritis and pelvic cellulitis: Secondary | ICD-10-CM

## 2013-12-23 DIAGNOSIS — Z91040 Latex allergy status: Secondary | ICD-10-CM | POA: Insufficient documentation

## 2013-12-23 DIAGNOSIS — Z862 Personal history of diseases of the blood and blood-forming organs and certain disorders involving the immune mechanism: Secondary | ICD-10-CM | POA: Insufficient documentation

## 2013-12-23 DIAGNOSIS — R102 Pelvic and perineal pain: Secondary | ICD-10-CM

## 2013-12-23 DIAGNOSIS — N760 Acute vaginitis: Secondary | ICD-10-CM | POA: Insufficient documentation

## 2013-12-23 DIAGNOSIS — B9689 Other specified bacterial agents as the cause of diseases classified elsewhere: Secondary | ICD-10-CM | POA: Insufficient documentation

## 2013-12-23 DIAGNOSIS — Z872 Personal history of diseases of the skin and subcutaneous tissue: Secondary | ICD-10-CM | POA: Insufficient documentation

## 2013-12-23 LAB — URINALYSIS, ROUTINE W REFLEX MICROSCOPIC
BILIRUBIN URINE: NEGATIVE
Glucose, UA: NEGATIVE mg/dL
KETONES UR: NEGATIVE mg/dL
NITRITE: NEGATIVE
Protein, ur: NEGATIVE mg/dL
Specific Gravity, Urine: 1.009 (ref 1.005–1.030)
Urobilinogen, UA: 1 mg/dL (ref 0.0–1.0)
pH: 7.5 (ref 5.0–8.0)

## 2013-12-23 LAB — CBC WITH DIFFERENTIAL/PLATELET
BASOS PCT: 1 % (ref 0–1)
Basophils Absolute: 0 10*3/uL (ref 0.0–0.1)
Eosinophils Absolute: 0.1 10*3/uL (ref 0.0–0.7)
Eosinophils Relative: 2 % (ref 0–5)
HCT: 34.1 % — ABNORMAL LOW (ref 36.0–46.0)
Hemoglobin: 11.8 g/dL — ABNORMAL LOW (ref 12.0–15.0)
LYMPHS ABS: 1.5 10*3/uL (ref 0.7–4.0)
Lymphocytes Relative: 28 % (ref 12–46)
MCH: 32.1 pg (ref 26.0–34.0)
MCHC: 34.6 g/dL (ref 30.0–36.0)
MCV: 92.7 fL (ref 78.0–100.0)
Monocytes Absolute: 0.5 10*3/uL (ref 0.1–1.0)
Monocytes Relative: 10 % (ref 3–12)
NEUTROS ABS: 3.1 10*3/uL (ref 1.7–7.7)
NEUTROS PCT: 60 % (ref 43–77)
Platelets: 215 10*3/uL (ref 150–400)
RBC: 3.68 MIL/uL — AB (ref 3.87–5.11)
RDW: 12.1 % (ref 11.5–15.5)
WBC: 5.2 10*3/uL (ref 4.0–10.5)

## 2013-12-23 LAB — URINE MICROSCOPIC-ADD ON

## 2013-12-23 LAB — COMPREHENSIVE METABOLIC PANEL
ALBUMIN: 4.3 g/dL (ref 3.5–5.2)
ALK PHOS: 91 U/L (ref 39–117)
ALT: 13 U/L (ref 0–35)
AST: 21 U/L (ref 0–37)
BILIRUBIN TOTAL: 1.3 mg/dL — AB (ref 0.3–1.2)
BUN: 8 mg/dL (ref 6–23)
CHLORIDE: 103 meq/L (ref 96–112)
CO2: 23 mEq/L (ref 19–32)
Calcium: 9.2 mg/dL (ref 8.4–10.5)
Creatinine, Ser: 0.71 mg/dL (ref 0.50–1.10)
GFR calc Af Amer: >90 mL/min (ref >90)
GFR calc non Af Amer: >90 mL/min (ref >90)
GLUCOSE: 105 mg/dL — AB (ref 70–99)
POTASSIUM: 3.7 meq/L (ref 3.7–5.3)
SODIUM: 139 meq/L (ref 137–147)
Total Protein: 7.6 g/dL (ref 6.0–8.3)

## 2013-12-23 LAB — POC URINE PREG, ED: PREG TEST UR: NEGATIVE

## 2013-12-23 LAB — LIPASE, BLOOD: Lipase: 29 U/L (ref 11–59)

## 2013-12-23 LAB — WET PREP, GENITAL
Trich, Wet Prep: NONE SEEN
Yeast Wet Prep HPF POC: NONE SEEN

## 2013-12-23 MED ORDER — ONDANSETRON HCL 4 MG PO TABS: 4.0000 mg | ORAL_TABLET | Freq: Three times a day (TID) | ORAL | Status: DC | PRN

## 2013-12-23 MED ORDER — ONDANSETRON HCL 4 MG/2ML IJ SOLN
4.0000 mg | Freq: Once | INTRAMUSCULAR | Status: AC
  Administered 2013-12-23: 4 mg via INTRAVENOUS
  Filled 2013-12-23: qty 2

## 2013-12-23 MED ORDER — HYDROMORPHONE HCL PF 1 MG/ML IJ SOLN
1.0000 mg | Freq: Once | INTRAMUSCULAR | Status: AC
  Administered 2013-12-23: 1 mg via INTRAVENOUS
  Filled 2013-12-23: qty 1

## 2013-12-23 MED ORDER — LACTINEX PO CHEW: 1.0000 | CHEWABLE_TABLET | Freq: Three times a day (TID) | ORAL | Status: DC

## 2013-12-23 MED ORDER — HYDROCODONE-ACETAMINOPHEN 5-325 MG PO TABS: 1.0000 | ORAL_TABLET | Freq: Four times a day (QID) | ORAL | Status: DC | PRN

## 2013-12-23 MED ORDER — CEFTRIAXONE SODIUM 1 G IJ SOLR
1.0000 g | Freq: Once | INTRAMUSCULAR | Status: DC
  Filled 2013-12-23: qty 10

## 2013-12-23 MED ORDER — DOXYCYCLINE HYCLATE 100 MG PO CAPS: 100.0000 mg | ORAL_CAPSULE | Freq: Two times a day (BID) | ORAL | Status: DC

## 2013-12-23 MED ORDER — METRONIDAZOLE 500 MG PO TABS: 500.0000 mg | ORAL_TABLET | Freq: Two times a day (BID) | ORAL | Status: DC

## 2013-12-23 MED ORDER — TRAMADOL HCL 50 MG PO TABS: 50.0000 mg | ORAL_TABLET | Freq: Four times a day (QID) | ORAL | Status: DC | PRN

## 2013-12-23 MED ORDER — AZITHROMYCIN 250 MG PO TABS
1000.0000 mg | ORAL_TABLET | Freq: Once | ORAL | Status: DC
  Filled 2013-12-23: qty 4

## 2013-12-23 MED ORDER — LIDOCAINE HCL 2 % EX GEL
Freq: Once | CUTANEOUS | Status: AC
  Administered 2013-12-23: 21:00:00 via TOPICAL
  Filled 2013-12-23: qty 10

## 2013-12-23 MED ORDER — FENTANYL CITRATE 0.05 MG/ML IJ SOLN
100.0000 ug | Freq: Once | INTRAMUSCULAR | Status: AC
  Administered 2013-12-23: 100 ug via INTRAVENOUS
  Filled 2013-12-23: qty 2

## 2013-12-23 NOTE — ED Notes (Signed)
Patient states that she is "still in a lot of pain. I need more Dilaudid." Will make PA aware Patient noted to be up for D/C

## 2013-12-23 NOTE — ED Notes (Addendum)
Patient refusing to sign DC papers, refusing DC VS Escorted out of ED by security staff and charge nurse

## 2013-12-23 NOTE — ED Notes (Signed)
Patient asking for additional pain medication--PA made aware 

## 2013-12-23 NOTE — ED Notes (Signed)
Patient continues to yell and scream at nursing and medical staff PA at bedside to speak with patient Patient screaming "fuck you" to PA Security at bedside

## 2013-12-23 NOTE — ED Notes (Signed)
In patient's room to review new DC instructions Patient now refusing Ultram, stating "Well she can go back and change it to the Vicodin, because now she's giving me something that's not a narcotic." Patient initially refused Norco, as documented Harris, PA aware and states that she is not going to change Rx Patient informed that DC instructions are to stay as there are, per PA--patient began yelling at this nurse and I immediately left the room Candise BowensJen, Geophysicist/field seismologistN Charge Nurse made aware and is currently at bedside

## 2013-12-23 NOTE — ED Notes (Signed)
Patient repeatedly coming out of room to nursing station, asking for IV Dilaudid Patient states "I want another pain shot before I leave." Patient informed that PA is currently in another patient's room and that she needs to wait for PA, especially since she has refused D/C medications (See MAR) and wants Rx for Norco changed to Percocet Patient v/u and walked back into room

## 2013-12-23 NOTE — ED Notes (Signed)
In patient room several times to give meds and DC, patient not in room--belongings still in room

## 2013-12-23 NOTE — ED Notes (Signed)
PA at bedside.

## 2013-12-23 NOTE — ED Notes (Signed)
Patient recently given pain medication at 1851 and states "It's already worn off." Patient states that she wants Dilaudid IV--will make MD aware

## 2013-12-23 NOTE — ED Notes (Signed)
Patient pulled out PIV without making nursing staff aware
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
## 2013-12-23 NOTE — ED Notes (Signed)
Patient now back in room Patient states that she can't take Rocephin or Azithromycin as ordered by PA---no reason given when asked by this nurse as to why she can't Patient then states that she does not want Rx for Norco, because "It gives me a headache. I want Percocet." Will inform PA of the above

## 2013-12-23 NOTE — ED Notes (Signed)
Pt states in extreme vaginal pain, cannot provide urine specimen, when asked pt state "absolutley not"

## 2013-12-23 NOTE — Discharge Instructions (Signed)
Pelvic Inflammatory Disease °Pelvic inflammatory disease (PID) refers to an infection in some or all of the female organs. The infection can be in the uterus, ovaries, fallopian tubes, or the surrounding tissues in the pelvis. PID can cause abdominal or pelvic pain that comes on suddenly (acute pelvic pain). PID is a serious infection because it can lead to lasting (chronic) pelvic pain or the inability to have children (infertile).  °CAUSES  °The infection is often caused by the normal bacteria found in the vaginal tissues. PID may also be caused by an infection that is spread during sexual contact. PID can also occur following:  °· The birth of a baby.   °· A miscarriage.   °· An abortion.   °· Major pelvic surgery.   °· The use of an intrauterine device (IUD).   °· A sexual assault.   °RISK FACTORS °Certain factors can put a person at higher risk for PID, such as: °· Being younger than 25 years. °· Being sexually active at a young age. °· Using nonbarrier contraception. °· Having multiple sexual partners. °· Having sex with someone who has symptoms of a genital infection. °· Using oral contraception. °Other times, certain behaviors can increase the possibility of getting PID, such as: °· Having sex during your period. °· Using a vaginal douche. °· Having an intrauterine device (IUD) in place. °SYMPTOMS  °· Abdominal or pelvic pain.   °· Fever.   °· Chills.   °· Abnormal vaginal discharge. °· Abnormal uterine bleeding.   °· Unusual pain shortly after finishing your period. °DIAGNOSIS  °Your caregiver will choose some of the following methods to make a diagnosis, such as:  °· Performing a physical exam and history. A pelvic exam typically reveals a very tender uterus and surrounding pelvis.   °· Ordering laboratory tests including a pregnancy test, blood tests, and urine test.  °· Ordering cultures of the vagina and cervix to check for a sexually transmitted infection (STI). °· Performing an ultrasound.    °· Performing a laparoscopic procedure to look inside the pelvis.   °TREATMENT  °· Antibiotic medicines may be prescribed and taken by mouth.   °· Sexual partners may be treated when the infection is caused by a sexually transmitted disease (STD).   °· Hospitalization may be needed to give antibiotics intravenously. °· Surgery may be needed, but this is rare. °It may take weeks until you are completely well. If you are diagnosed with PID, you should also be checked for human immunodeficiency virus (HIV).   °HOME CARE INSTRUCTIONS  °· If given, take your antibiotics as directed. Finish the medicine even if you start to feel better.   °· Only take over-the-counter or prescription medicines for pain, discomfort, or fever as directed by your caregiver.   °· Do not have sexual intercourse until treatment is completed or as directed by your caregiver. If PID is confirmed, your recent sexual partner(s) will need treatment.   °· Keep your follow-up appointments. °SEEK MEDICAL CARE IF:  °· You have increased or abnormal vaginal discharge.   °· You need prescription medicine for your pain.   °· You vomit.   °· You cannot take your medicines.   °· Your partner has an STD.   °SEEK IMMEDIATE MEDICAL CARE IF:  °· You have a fever.   °· You have increased abdominal or pelvic pain.   °· You have chills.   °· You have pain when you urinate.   °· You are not better after 72 hours following treatment.   °MAKE SURE YOU:  °· Understand these instructions. °· Will watch your condition. °· Will get help right away if you are not doing well or get worse. °  Document Released: 09/04/2005 Document Revised: 12/30/2012 Document Reviewed: 08/31/2011 [facility] Patient Information 2014 [place], [place].  Bacterial Vaginosis Bacterial vaginosis is a vaginal infection that occurs when the normal balance of bacteria in the vagina is disrupted. It results from an overgrowth of certain bacteria. This is the most common vaginal infection in women of  childbearing age. Treatment is important to prevent complications, especially in pregnant women, as it can cause a premature delivery. CAUSES  Bacterial vaginosis is caused by an increase in harmful bacteria that are normally present in smaller amounts in the vagina. Several different kinds of bacteria can cause bacterial vaginosis. However, the reason that the condition develops is not fully understood. RISK FACTORS Certain activities or behaviors can put you at an increased risk of developing bacterial vaginosis, including:  Having a new sex partner or multiple sex partners.  Douching.  Using an intrauterine device (IUD) for contraception. Women do not get bacterial vaginosis from toilet seats, bedding, swimming pools, or contact with objects around them. SIGNS AND SYMPTOMS  Some women with bacterial vaginosis have no signs or symptoms. Common symptoms include:  Grey vaginal discharge.  A fishlike odor with discharge, especially after sexual intercourse.  Itching or burning of the vagina and vulva.  Burning or pain with urination. DIAGNOSIS  Your health care provider will take a medical history and examine the vagina for signs of bacterial vaginosis. A sample of vaginal fluid may be taken. Your health care provider will look at this sample under a microscope to check for bacteria and abnormal cells. A vaginal pH test may also be done.  TREATMENT  Bacterial vaginosis may be treated with antibiotic medicines. These may be given in the form of a pill or a vaginal cream. A second round of antibiotics may be prescribed if the condition comes back after treatment.  HOME CARE INSTRUCTIONS   Only take over-the-counter or prescription medicines as directed by your health care provider.  If antibiotic medicine was prescribed, take it as directed. Make sure you finish it even if you start to feel better.  Do not have sex until treatment is completed.  Tell all sexual partners that you have a  vaginal infection. They should see their health care provider and be treated if they have problems, such as a mild rash or itching.  Practice safe sex by using condoms and only having one sex partner. SEEK MEDICAL CARE IF:   Your symptoms are not improving after 3 days of treatment.  You have increased discharge or pain.  You have a fever. MAKE SURE YOU:   Understand these instructions.  Will watch your condition.  Will get help right away if you are not doing well or get worse. FOR MORE INFORMATION  Centers for Disease Control and Prevention, Division of STD Prevention: SolutionApps.co.zawww.cdc.gov/std American Sexual Health Association (ASHA): www.ashastd.org  Document Released: 09/04/2005 Document Revised: 06/25/2013 Document Reviewed: 04/16/2013 [facility] Patient Information 2014 [place], [place].

## 2013-12-23 NOTE — ED Notes (Signed)
Pt reports from OBGYN, dx with herpes outbreak. Reports 1st outbreak. Also reports right sided groin pain that radiates to the back, hx of PID and similar pain. Pain 10/10. Pt tearful and has been vomiting, unable to keep fluids down.

## 2013-12-23 NOTE — ED Notes (Signed)
Just at GYN- known herpes outbreak. N/V

## 2013-12-24 LAB — GC/CHLAMYDIA PROBE AMP
CT Probe RNA: NEGATIVE
GC Probe RNA: NEGATIVE

## 2013-12-25 LAB — URINE CULTURE

## 2013-12-26 NOTE — ED Provider Notes (Signed)
Medical screening examination/treatment/procedure(s) were performed by non-physician practitioner and as supervising physician I was immediately available for consultation/collaboration.   EKG Interpretation None       Donnetta HutchingBrian Aliha Diedrich, MD 12/26/13 2003

## 2014-05-28 ENCOUNTER — Emergency Department (HOSPITAL_COMMUNITY)
Admission: EM | Admit: 2014-05-28 | Discharge: 2014-05-28 | Disposition: A | Discharge status: Home / Self Care | Payer: Managed Care, Other (non HMO) | Attending: Emergency Medicine | Admitting: Emergency Medicine

## 2014-05-28 ENCOUNTER — Emergency Department (HOSPITAL_COMMUNITY): Payer: Managed Care, Other (non HMO)

## 2014-05-28 ENCOUNTER — Encounter (HOSPITAL_COMMUNITY): Payer: Self-pay | Admitting: Emergency Medicine

## 2014-05-28 DIAGNOSIS — Z87442 Personal history of urinary calculi: Secondary | ICD-10-CM | POA: Insufficient documentation

## 2014-05-28 DIAGNOSIS — Z8742 Personal history of other diseases of the female genital tract: Secondary | ICD-10-CM | POA: Insufficient documentation

## 2014-05-28 DIAGNOSIS — F17200 Nicotine dependence, unspecified, uncomplicated: Secondary | ICD-10-CM | POA: Insufficient documentation

## 2014-05-28 DIAGNOSIS — Z8619 Personal history of other infectious and parasitic diseases: Secondary | ICD-10-CM | POA: Diagnosis not present

## 2014-05-28 DIAGNOSIS — Z3202 Encounter for pregnancy test, result negative: Secondary | ICD-10-CM | POA: Diagnosis not present

## 2014-05-28 DIAGNOSIS — Z8669 Personal history of other diseases of the nervous system and sense organs: Secondary | ICD-10-CM | POA: Insufficient documentation

## 2014-05-28 DIAGNOSIS — R1011 Right upper quadrant pain: Secondary | ICD-10-CM | POA: Insufficient documentation

## 2014-05-28 DIAGNOSIS — Z862 Personal history of diseases of the blood and blood-forming organs and certain disorders involving the immune mechanism: Secondary | ICD-10-CM | POA: Insufficient documentation

## 2014-05-28 DIAGNOSIS — R197 Diarrhea, unspecified: Secondary | ICD-10-CM | POA: Insufficient documentation

## 2014-05-28 DIAGNOSIS — Z872 Personal history of diseases of the skin and subcutaneous tissue: Secondary | ICD-10-CM | POA: Insufficient documentation

## 2014-05-28 DIAGNOSIS — Z8739 Personal history of other diseases of the musculoskeletal system and connective tissue: Secondary | ICD-10-CM | POA: Diagnosis not present

## 2014-05-28 DIAGNOSIS — F3289 Other specified depressive episodes: Secondary | ICD-10-CM | POA: Insufficient documentation

## 2014-05-28 DIAGNOSIS — F329 Major depressive disorder, single episode, unspecified: Secondary | ICD-10-CM | POA: Insufficient documentation

## 2014-05-28 DIAGNOSIS — Z91040 Latex allergy status: Secondary | ICD-10-CM | POA: Diagnosis not present

## 2014-05-28 DIAGNOSIS — R112 Nausea with vomiting, unspecified: Secondary | ICD-10-CM | POA: Diagnosis not present

## 2014-05-28 DIAGNOSIS — R319 Hematuria, unspecified: Secondary | ICD-10-CM | POA: Diagnosis not present

## 2014-05-28 DIAGNOSIS — F411 Generalized anxiety disorder: Secondary | ICD-10-CM | POA: Diagnosis not present

## 2014-05-28 DIAGNOSIS — R109 Unspecified abdominal pain: Secondary | ICD-10-CM

## 2014-05-28 LAB — PREGNANCY, URINE: PREG TEST UR: NEGATIVE

## 2014-05-28 LAB — CBC WITH DIFFERENTIAL/PLATELET
BASOS ABS: 0 10*3/uL (ref 0.0–0.1)
Basophils Relative: 0 % (ref 0–1)
EOS ABS: 0.1 10*3/uL (ref 0.0–0.7)
Eosinophils Relative: 2 % (ref 0–5)
HEMATOCRIT: 37.5 % (ref 36.0–46.0)
Hemoglobin: 12.8 g/dL (ref 12.0–15.0)
LYMPHS ABS: 2 10*3/uL (ref 0.7–4.0)
LYMPHS PCT: 29 % (ref 12–46)
MCH: 32.2 pg (ref 26.0–34.0)
MCHC: 34.1 g/dL (ref 30.0–36.0)
MCV: 94.2 fL (ref 78.0–100.0)
MONO ABS: 0.4 10*3/uL (ref 0.1–1.0)
Monocytes Relative: 6 % (ref 3–12)
Neutro Abs: 4.3 10*3/uL (ref 1.7–7.7)
Neutrophils Relative %: 63 % (ref 43–77)
Platelets: 309 10*3/uL (ref 150–400)
RBC: 3.98 MIL/uL (ref 3.87–5.11)
RDW: 12.4 % (ref 11.5–15.5)
WBC: 6.9 10*3/uL (ref 4.0–10.5)

## 2014-05-28 LAB — URINE MICROSCOPIC-ADD ON

## 2014-05-28 LAB — URINALYSIS, ROUTINE W REFLEX MICROSCOPIC
Bilirubin Urine: NEGATIVE
Glucose, UA: NEGATIVE mg/dL
Ketones, ur: NEGATIVE mg/dL
NITRITE: NEGATIVE
PROTEIN: NEGATIVE mg/dL
Specific Gravity, Urine: 1.007 (ref 1.005–1.030)
Urobilinogen, UA: 0.2 mg/dL (ref 0.0–1.0)
pH: 7 (ref 5.0–8.0)

## 2014-05-28 LAB — COMPREHENSIVE METABOLIC PANEL
ALT: 9 U/L (ref 0–35)
AST: 15 U/L (ref 0–37)
Albumin: 3.9 g/dL (ref 3.5–5.2)
Alkaline Phosphatase: 74 U/L (ref 39–117)
Anion gap: 15 (ref 5–15)
BUN: 8 mg/dL (ref 6–23)
CALCIUM: 9.3 mg/dL (ref 8.4–10.5)
CO2: 23 meq/L (ref 19–32)
CREATININE: 0.67 mg/dL (ref 0.50–1.10)
Chloride: 100 mEq/L (ref 96–112)
GFR calc Af Amer: >90 mL/min (ref >90)
GFR calc non Af Amer: >90 mL/min (ref >90)
GLUCOSE: 105 mg/dL — AB (ref 70–99)
Potassium: 3.4 mEq/L — ABNORMAL LOW (ref 3.7–5.3)
SODIUM: 138 meq/L (ref 137–147)
TOTAL PROTEIN: 8 g/dL (ref 6.0–8.3)
Total Bilirubin: 0.9 mg/dL (ref 0.3–1.2)

## 2014-05-28 LAB — LIPASE, BLOOD: Lipase: 35 U/L (ref 11–59)

## 2014-05-28 MED ORDER — ONDANSETRON 4 MG PO TBDP: ORAL_TABLET | ORAL | Status: DC

## 2014-05-28 MED ORDER — CEPHALEXIN 500 MG PO CAPS: 500.0000 mg | ORAL_CAPSULE | Freq: Three times a day (TID) | ORAL | Status: DC

## 2014-05-28 MED ORDER — OXYCODONE-ACETAMINOPHEN 5-325 MG PO TABS
2.0000 | ORAL_TABLET | Freq: Once | ORAL | Status: AC
  Administered 2014-05-28: 2 via ORAL
  Filled 2014-05-28: qty 2

## 2014-05-28 MED ORDER — HYDROMORPHONE HCL PF 1 MG/ML IJ SOLN
0.5000 mg | Freq: Once | INTRAMUSCULAR | Status: AC
  Administered 2014-05-28: 0.5 mg via INTRAVENOUS
  Filled 2014-05-28: qty 1

## 2014-05-28 MED ORDER — ONDANSETRON HCL 4 MG/2ML IJ SOLN
4.0000 mg | Freq: Once | INTRAMUSCULAR | Status: AC
  Administered 2014-05-28: 4 mg via INTRAVENOUS
  Filled 2014-05-28: qty 2

## 2014-05-28 MED ORDER — ONDANSETRON 4 MG PO TBDP
4.0000 mg | ORAL_TABLET | Freq: Once | ORAL | Status: AC
  Administered 2014-05-28: 4 mg via ORAL
  Filled 2014-05-28: qty 1

## 2014-05-28 MED ORDER — SODIUM CHLORIDE 0.9 % IV BOLUS (SEPSIS)
500.0000 mL | Freq: Once | INTRAVENOUS | Status: AC
  Administered 2014-05-28: 500 mL via INTRAVENOUS

## 2014-05-28 MED ORDER — METOCLOPRAMIDE HCL 5 MG/ML IJ SOLN
10.0000 mg | Freq: Once | INTRAMUSCULAR | Status: AC
  Administered 2014-05-28: 10 mg via INTRAVENOUS
  Filled 2014-05-28: qty 2

## 2014-05-28 MED ORDER — OXYCODONE-ACETAMINOPHEN 5-325 MG PO TABS
1.0000 | ORAL_TABLET | Freq: Four times a day (QID) | ORAL | Status: DC | PRN
Start: 2014-05-28 — End: 2014-10-02

## 2014-05-28 NOTE — ED Provider Notes (Signed)
CSN: [id number]     Arrival date & time 05/28/14  1508 History   First MD Initiated Contact with Patient 05/28/14 1803     Chief Complaint  Patient presents with  . Abdominal Pain     (Consider location/radiation/quality/duration/timing/severity/associated sxs/prior Treatment) Patient is a 23 y.o. female presenting with abdominal pain. The history is provided by the patient.  Abdominal Pain Pain location:  R flank and RUQ Pain quality: throbbing   Pain severity:  Severe Onset quality:  Sudden Duration:  2 days Timing:  Intermittent Progression:  Worsening Chronicity:  New Context: not alcohol use, not recent illness and not sick contacts   Associated symptoms: diarrhea, hematuria, nausea and vomiting   Associated symptoms: no chills and no fever     Past Medical History  Diagnosis Date  . IBS (irritable bowel syndrome)   . Bipolar 1 disorder   . Mononucleosis   . Kidney stones   . PID (acute pelvic inflammatory disease) 04/28/2012  . Chlamydia 04/29/2012  . C. difficile diarrhea   . Anemia   . Anxiety   . Arthritis   . Depression   . Ulcer   . Abnormal Pap smear     colposcopy  . HPV in female   . Fibromyalgia   . Seizures     October 2014 was last one    Past Surgical History  Procedure Laterality Date  . Kidney stone surgery    . Tonsillectomy    . Inner ear surgery    . Wisdom tooth extraction    . Colposcopy     Family History  Problem Relation Age of Onset  . Bipolar disorder Mother   . Cancer Father   . ADD / ADHD Brother   . Cancer Maternal Grandmother   . Cancer Maternal Grandfather   . Cancer Paternal Grandmother   . Cancer Paternal Grandfather    History  Substance Use Topics  . Smoking status: Current Every Day Smoker -- 0.50 packs/day for 5 years    Types: Cigarettes  . Smokeless tobacco: Never Used  . Alcohol Use: No   OB History   Grav Para Term Preterm Abortions TAB SAB Ect Mult Living   0              Review of Systems    Constitutional: Negative for fever and chills.  Gastrointestinal: Positive for nausea, vomiting, abdominal pain and diarrhea.  Genitourinary: Positive for hematuria.  All other systems reviewed and are negative.     Allergies  Azithromycin; Prednisone; Bactrim; Doxycycline hyclate; Ibuprofen; Latex; and Toradol  Home Medications   Prior to Admission medications   Medication Sig Start Date End Date Taking? Authorizing Provider  metroNIDAZOLE (FLAGYL) 500 MG tablet Take 1 tablet (500 mg total) by mouth 2 (two) times daily. 12/23/13   Arthor Captain, PA-C  traMADol (ULTRAM) 50 MG tablet Take 1 tablet (50 mg total) by mouth every 6 (six) hours as needed. 12/23/13   Abigail Harris, PA-C   BP 134/72  Pulse 102  Temp(Src) 97.9 F (36.6 C) (Oral)  Resp 16  SpO2 100%  LMP 05/21/2014 Physical Exam  Nursing note and vitals reviewed. Constitutional: She is oriented to person, place, and time. She appears well-developed and well-nourished.  HENT:  Mouth/Throat: Oropharynx is clear and moist.  Eyes: Pupils are equal, round, and reactive to light.  Neck: Neck supple.  Cardiovascular: Normal heart sounds.   Pulmonary/Chest: Effort normal and breath sounds normal.  Abdominal: Soft. There is tenderness  in the right upper quadrant. There is CVA tenderness.    Musculoskeletal: She exhibits no edema and no tenderness.  Lymphadenopathy:    She has no cervical adenopathy.  Neurological: She is alert and oriented to person, place, and time.  Skin: Skin is warm and dry.  Psychiatric: She has a normal mood and affect.    ED Course  Procedures (including critical care time) Labs Review Labs Reviewed  COMPREHENSIVE METABOLIC PANEL - Abnormal; Notable for the following:    Potassium 3.4 (*)    Glucose, Bld 105 (*)    All other components within normal limits  URINALYSIS, ROUTINE W REFLEX MICROSCOPIC - Abnormal; Notable for the following:    Hgb urine dipstick LARGE (*)    Leukocytes, UA TRACE  (*)    All other components within normal limits  CBC WITH DIFFERENTIAL  LIPASE, BLOOD  PREGNANCY, URINE  URINE MICROSCOPIC-ADD ON    Imaging Review No results found.   EKG Interpretation None     Right sided flank pain with hematuria.  History of kidney stones. No uretal stones noted on renal CT.  CBC, BMET noted.  Large hemoglobin, trace leukoctyes, 3-6 wbc and rbc in urine.  ? Early UTI.  Will cover with keflex. RX for anti-emetic and analgesic. Follow-up with PCP. MDM   Final diagnoses:  None    Abdominal pain. Return precautions discussed.    Jimmye Norman, NP 05/29/14 0120

## 2014-05-28 NOTE — ED Notes (Signed)
Pt c/o generalized abd pain, pt has n/v/d.

## 2014-05-28 NOTE — ED Notes (Signed)
Patient transported to CT 

## 2014-05-28 NOTE — Discharge Instructions (Signed)
Abdominal Pain, Women °Abdominal (stomach, pelvic, or belly) pain can be caused by many things. It is important to tell your doctor: °· The location of the pain. °· Does it come and go or is it present all the time? °· Are there things that start the pain (eating certain foods, exercise)? °· Are there other symptoms associated with the pain (fever, nausea, vomiting, diarrhea)? °All of this is helpful to know when trying to find the cause of the pain. °CAUSES  °· Stomach: virus or bacteria infection, or ulcer. °· Intestine: appendicitis (inflamed appendix), regional ileitis (Crohn's disease), ulcerative colitis (inflamed colon), irritable bowel syndrome, diverticulitis (inflamed diverticulum of the colon), or cancer of the stomach or intestine. °· Gallbladder disease or stones in the gallbladder. °· Kidney disease, kidney stones, or infection. °· Pancreas infection or cancer. °· Fibromyalgia (pain disorder). °· Diseases of the female organs: °¨ Uterus: fibroid (non-cancerous) tumors or infection. °¨ Fallopian tubes: infection or tubal pregnancy. °¨ Ovary: cysts or tumors. °¨ Pelvic adhesions (scar tissue). °¨ Endometriosis (uterus lining tissue growing in the pelvis and on the pelvic organs). °¨ Pelvic congestion syndrome (female organs filling up with blood just before the menstrual period). °¨ Pain with the menstrual period. °¨ Pain with ovulation (producing an egg). °¨ Pain with an IUD (intrauterine device, birth control) in the uterus. °¨ Cancer of the female organs. °· Functional pain (pain not caused by a disease, may improve without treatment). °· Psychological pain. °· Depression. °DIAGNOSIS  °Your doctor will decide the seriousness of your pain by doing an examination. °· Blood tests. °· X-rays. °· Ultrasound. °· CT scan (computed tomography, special type of X-ray). °· MRI (magnetic resonance imaging). °· Cultures, for infection. °· Barium enema (dye inserted in the large intestine, to better view it with  X-rays). °· Colonoscopy (looking in intestine with a lighted tube). °· Laparoscopy (minor surgery, looking in abdomen with a lighted tube). °· Major abdominal exploratory surgery (looking in abdomen with a large incision). °TREATMENT  °The treatment will depend on the cause of the pain.  °· Many cases can be observed and treated at home. °· Over-the-counter medicines recommended by your caregiver. °· Prescription medicine. °· Antibiotics, for infection. °· Birth control pills, for painful periods or for ovulation pain. °· Hormone treatment, for endometriosis. °· Nerve blocking injections. °· Physical therapy. °· Antidepressants. °· Counseling with a psychologist or psychiatrist. °· Minor or major surgery. °HOME CARE INSTRUCTIONS  °· Do not take laxatives, unless directed by your caregiver. °· Take over-the-counter pain medicine only if ordered by your caregiver. Do not take aspirin because it can cause an upset stomach or bleeding. °· Try a clear liquid diet (broth or water) as ordered by your caregiver. Slowly move to a bland diet, as tolerated, if the pain is related to the stomach or intestine. °· Have a thermometer and take your temperature several times a day, and record it. °· Bed rest and sleep, if it helps the pain. °· Avoid sexual intercourse, if it causes pain. °· Avoid stressful situations. °· Keep your follow-up appointments and tests, as your caregiver orders. °· If the pain does not go away with medicine or surgery, you may try: °¨ Acupuncture. °¨ Relaxation exercises (yoga, meditation). °¨ Group therapy. °¨ Counseling. °SEEK MEDICAL CARE IF:  °· You notice certain foods cause stomach pain. °· Your home care treatment is not helping your pain. °· You need stronger pain medicine. °· You want your IUD removed. °· You feel faint or   lightheaded. °· You develop nausea and vomiting. °· You develop a rash. °· You are having side effects or an allergy to your medicine. °SEEK IMMEDIATE MEDICAL CARE IF:  °· Your  pain does not go away or gets worse. °· You have a fever. °· Your pain is felt only in portions of the abdomen. The right side could possibly be appendicitis. The left lower portion of the abdomen could be colitis or diverticulitis. °· You are passing blood in your stools (bright red or black tarry stools, with or without vomiting). °· You have blood in your urine. °· You develop chills, with or without a fever. °· You pass out. °MAKE SURE YOU:  °· Understand these instructions. °· Will watch your condition. °· Will get help right away if you are not doing well or get worse. °Document Released: 07/02/2007 Document Revised: 01/19/2014 Document Reviewed: 07/22/2009 °ExitCare® Patient Information ©2015 [employer]. This information is not intended to replace advice given to you by your health care provider. Make sure you discuss any questions you have with your health care provider. ° °

## 2014-05-31 NOTE — ED Provider Notes (Signed)
Medical screening examination/treatment/procedure(s) were performed by non-physician practitioner and as supervising physician I was immediately available for consultation/collaboration.   EKG Interpretation None       Flint Melter, MD 05/31/14 [phone]

## 2014-06-18 ENCOUNTER — Encounter (HOSPITAL_BASED_OUTPATIENT_CLINIC_OR_DEPARTMENT_OTHER): Payer: Self-pay | Admitting: Emergency Medicine

## 2014-06-18 ENCOUNTER — Emergency Department (HOSPITAL_BASED_OUTPATIENT_CLINIC_OR_DEPARTMENT_OTHER)
Admission: EM | Admit: 2014-06-18 | Discharge: 2014-06-19 | Disposition: A | Discharge status: Home / Self Care | Payer: Managed Care, Other (non HMO) | Attending: Emergency Medicine | Admitting: Emergency Medicine

## 2014-06-18 DIAGNOSIS — Z8659 Personal history of other mental and behavioral disorders: Secondary | ICD-10-CM | POA: Diagnosis not present

## 2014-06-18 DIAGNOSIS — Z8742 Personal history of other diseases of the female genital tract: Secondary | ICD-10-CM | POA: Diagnosis not present

## 2014-06-18 DIAGNOSIS — Z862 Personal history of diseases of the blood and blood-forming organs and certain disorders involving the immune mechanism: Secondary | ICD-10-CM | POA: Diagnosis not present

## 2014-06-18 DIAGNOSIS — Z8619 Personal history of other infectious and parasitic diseases: Secondary | ICD-10-CM | POA: Diagnosis not present

## 2014-06-18 DIAGNOSIS — Z87442 Personal history of urinary calculi: Secondary | ICD-10-CM | POA: Insufficient documentation

## 2014-06-18 DIAGNOSIS — Z8739 Personal history of other diseases of the musculoskeletal system and connective tissue: Secondary | ICD-10-CM | POA: Insufficient documentation

## 2014-06-18 DIAGNOSIS — R1013 Epigastric pain: Secondary | ICD-10-CM | POA: Diagnosis present

## 2014-06-18 DIAGNOSIS — K2970 Gastritis, unspecified, without bleeding: Secondary | ICD-10-CM | POA: Diagnosis not present

## 2014-06-18 DIAGNOSIS — Z720 Tobacco use: Secondary | ICD-10-CM | POA: Insufficient documentation

## 2014-06-18 DIAGNOSIS — Z79899 Other long term (current) drug therapy: Secondary | ICD-10-CM | POA: Diagnosis not present

## 2014-06-18 DIAGNOSIS — Z8669 Personal history of other diseases of the nervous system and sense organs: Secondary | ICD-10-CM | POA: Insufficient documentation

## 2014-06-18 DIAGNOSIS — Z91040 Latex allergy status: Secondary | ICD-10-CM | POA: Diagnosis not present

## 2014-06-18 DIAGNOSIS — Z3202 Encounter for pregnancy test, result negative: Secondary | ICD-10-CM | POA: Insufficient documentation

## 2014-06-18 DIAGNOSIS — Z872 Personal history of diseases of the skin and subcutaneous tissue: Secondary | ICD-10-CM | POA: Insufficient documentation

## 2014-06-18 MED ORDER — SODIUM CHLORIDE 0.9 % IV BOLUS (SEPSIS)
1000.0000 mL | Freq: Once | INTRAVENOUS | Status: AC
  Administered 2014-06-19: 1000 mL via INTRAVENOUS

## 2014-06-18 MED ORDER — ONDANSETRON HCL 4 MG/2ML IJ SOLN
4.0000 mg | Freq: Once | INTRAMUSCULAR | Status: AC
  Administered 2014-06-19: 4 mg via INTRAVENOUS
  Filled 2014-06-18: qty 2

## 2014-06-18 NOTE — ED Provider Notes (Signed)
CSN: [id number]     Arrival date & time 06/18/14  2254 History  This chart was scribed for No att. providers found by Jolene Provost, ED Scribe. This patient was seen in room MH02/MH02 and the patient's care was started at 11:40 PM.     Chief Complaint  Patient presents with  . Abdominal Pain   Patient is a 23 y.o. female presenting with abdominal pain. The history is provided by the patient. No language interpreter was used.  Abdominal Pain Pain location:  RUQ and epigastric Pain quality: sharp   Pain radiates to:  Does not radiate Pain severity:  Severe Onset quality:  Gradual Duration:  3 days Timing:  Constant Progression:  Unchanged Chronicity:  Recurrent Context: not alcohol use and not recent travel   Relieved by:  Nothing Worsened by:  Eating Ineffective treatments:  NSAIDs Associated symptoms: vomiting   Associated symptoms: no chest pain, no diarrhea, no dysuria, no shortness of breath and no vaginal discharge   Risk factors: not obese    HPI Comments: Tonya Davidson is a 23 y.o. female who presents to the Emergency Department complaining of upper abdominal pain that is constant, stabbing and started three days ago.  Pt states that she went to a gastroenterologist 3 years ago who said that her "gall bladder was messed up," but she didn't follow up with the physician. Pt states that pain is similar to pain that she felt three years ago but is more intense. Pt states that pain is worse after eating. Pt endorses associated vomiting with 8 events today, and 11 yesterday. Pt denies diarrhea but states that she is having loose stools. Pt denies constipation, CP, SOB, and swelling to her BLE, urinary symptoms, or rash. Pt has taken pepto bismol and ibuprofen without relief. Pt is on birth control. LNMP was mid August, 2015. Pt denies long car trips, surgeries or hospitalizations.  Past Medical History  Diagnosis Date  . IBS (irritable bowel syndrome)   . Bipolar 1 disorder   .  Mononucleosis   . Kidney stones   . PID (acute pelvic inflammatory disease) 04/28/2012  . Chlamydia 04/29/2012  . C. difficile diarrhea   . Anemia   . Anxiety   . Arthritis   . Depression   . Ulcer   . Abnormal Pap smear     colposcopy  . HPV in female   . Fibromyalgia   . Seizures     October 2014 was last one    Past Surgical History  Procedure Laterality Date  . Kidney stone surgery    . Tonsillectomy    . Inner ear surgery    . Wisdom tooth extraction    . Colposcopy     Family History  Problem Relation Age of Onset  . Bipolar disorder Mother   . Cancer Father   . ADD / ADHD Brother   . Cancer Maternal Grandmother   . Cancer Maternal Grandfather   . Cancer Paternal Grandmother   . Cancer Paternal Grandfather    History  Substance Use Topics  . Smoking status: Current Every Day Smoker -- 0.50 packs/day for 5 years    Types: Cigarettes  . Smokeless tobacco: Never Used  . Alcohol Use: No   OB History   Grav Para Term Preterm Abortions TAB SAB Ect Mult Living   0              Review of Systems  Respiratory: Negative for shortness of breath.  Cardiovascular: Negative for chest pain.  Gastrointestinal: Positive for vomiting and abdominal pain. Negative for diarrhea.  Genitourinary: Negative for dysuria and vaginal discharge.  All other systems reviewed and are negative.     Allergies  Azithromycin; Prednisone; Bactrim; Doxycycline hyclate; Ibuprofen; Latex; and Toradol  Home Medications   Prior to Admission medications   Medication Sig Start Date End Date Taking? Authorizing Provider  acetaminophen (TYLENOL) 500 MG tablet Take 1,000 mg by mouth every 6 (six) hours as needed for mild pain.    Historical Provider, MD  norgestimate-ethinyl estradiol (ORTHO-CYCLEN,SPRINTEC,PREVIFEM) 0.25-35 MG-MCG tablet Take 1 tablet by mouth daily.    Historical Provider, MD  ondansetron (ZOFRAN ODT) 4 MG disintegrating tablet 4mg  ODT q4 hours prn nausea/vomit 05/28/14    Jimmye Norman, NP  oxyCODONE-acetaminophen (PERCOCET/ROXICET) 5-325 MG per tablet Take 1 tablet by mouth every 6 (six) hours as needed for severe pain. 05/28/14   Jimmye Norman, NP   BP 122/72  Pulse 105  Temp(Src) 98.3 F (36.8 C) (Oral)  Resp 16  Wt 106 lb (48.081 kg)  SpO2 100%  LMP 05/21/2014 Physical Exam  Nursing note and vitals reviewed. Constitutional: She is oriented to person, place, and time. She appears well-developed and well-nourished. No distress.  HENT:  Head: Normocephalic and atraumatic.  Mouth/Throat: Oropharynx is clear and moist. No oropharyngeal exudate.  Eyes: Conjunctivae and EOM are normal.  Pinpoint pupils  Neck: Normal range of motion. Neck supple.  Cardiovascular: Normal rate, regular rhythm and normal heart sounds.   Pulmonary/Chest: Effort normal and breath sounds normal. She has no wheezes. She has no rales.  Abdominal: Soft. Bowel sounds are normal. There is no tenderness. There is no rebound and no guarding.  Musculoskeletal: Normal range of motion. She exhibits no edema.  Neurological: She is alert and oriented to person, place, and time. She displays normal reflexes.  Skin: Skin is warm and dry. She is not diaphoretic.  Psychiatric: She has a normal mood and affect. Her behavior is normal.    ED Course  Procedures   DIAGNOSTIC STUDIES: Oxygen Saturation is 100% on RA, normal by my interpretation.    COORDINATION OF CARE: 11:45 PM Discussed treatment plan with pt at bedside and pt agreed to plan.  Labs Review Labs Reviewed - No data to display  Imaging Review No results found.   EKG Interpretation None      MDM   Final diagnoses:  None  Patient is declining pelvic exam Medications  ondansetron (ZOFRAN) injection 4 mg (4 mg Intravenous Given 06/19/14 0021)  sodium chloride 0.9 % bolus 1,000 mL (0 mLs Intravenous Stopped 06/19/14 0038)  fentaNYL (SUBLIMAZE) injection 50 mcg (50 mcg Intravenous Given 06/19/14 0044)   dicyclomine (BENTYL) injection 20 mg (20 mg Intramuscular Given 06/19/14 0131)  fentaNYL (SUBLIMAZE) injection 100 mcg (100 mcg Intravenous Given 06/19/14 0150)  promethazine (PHENERGAN) injection 12.5 mg (12.5 mg Intravenous Given 06/19/14 0150)   Pain markedly improved post second dose of medication.    Patient wants CT scan or advanced imaging.  Has had multiple CTs in our system and [facility] (by care everywhere) as well as a recent RUQ [place] at [facility] for same pain showing no stones.  LFTs are normal and there is no white count.  There are no signs of fever; vitals and exam are benign and reassuring do not feel additional imaging is indicated at this time.    Clear liquids x 2 days then advance to bland diet.  Pain medication.  Prilosec and carafate, close follow up with PMD and GI.  Strict abdominal pain return precautions given.    I personally performed the services described in this documentation, which was scribed in my presence. The recorded information has been reviewed and is accurate.     Jasmine AweApril K Nakiah Osgood-Rasch, MD 06/19/14 [phone]

## 2014-06-18 NOTE — ED Notes (Signed)
Pt c/o upper right abd pain x 3 days with n/v

## 2014-06-19 ENCOUNTER — Encounter (HOSPITAL_BASED_OUTPATIENT_CLINIC_OR_DEPARTMENT_OTHER): Payer: Self-pay | Admitting: Emergency Medicine

## 2014-06-19 LAB — COMPREHENSIVE METABOLIC PANEL
ANION GAP: 13 (ref 5–15)
AST: 14 U/L (ref 0–37)
Albumin: 4.1 g/dL (ref 3.5–5.2)
Alkaline Phosphatase: 86 U/L (ref 39–117)
BUN: 12 mg/dL (ref 6–23)
CO2: 25 meq/L (ref 19–32)
Calcium: 9.2 mg/dL (ref 8.4–10.5)
Chloride: 100 mEq/L (ref 96–112)
Creatinine, Ser: 0.8 mg/dL (ref 0.50–1.10)
GFR calc Af Amer: >90 mL/min (ref >90)
GLUCOSE: 90 mg/dL (ref 70–99)
Potassium: 4 mEq/L (ref 3.7–5.3)
SODIUM: 138 meq/L (ref 137–147)
Total Bilirubin: 1 mg/dL (ref 0.3–1.2)
Total Protein: 7.9 g/dL (ref 6.0–8.3)

## 2014-06-19 LAB — URINALYSIS, ROUTINE W REFLEX MICROSCOPIC
BILIRUBIN URINE: NEGATIVE
GLUCOSE, UA: NEGATIVE mg/dL
KETONES UR: NEGATIVE mg/dL
Leukocytes, UA: NEGATIVE
Nitrite: NEGATIVE
PH: 7 (ref 5.0–8.0)
PROTEIN: NEGATIVE mg/dL
Specific Gravity, Urine: 1.023 (ref 1.005–1.030)
Urobilinogen, UA: 1 mg/dL (ref 0.0–1.0)

## 2014-06-19 LAB — CBC WITH DIFFERENTIAL/PLATELET
Basophils Absolute: 0 10*3/uL (ref 0.0–0.1)
Basophils Relative: 1 % (ref 0–1)
EOS PCT: 1 % (ref 0–5)
Eosinophils Absolute: 0.1 10*3/uL (ref 0.0–0.7)
HCT: 37.3 % (ref 36.0–46.0)
Hemoglobin: 12.8 g/dL (ref 12.0–15.0)
LYMPHS ABS: 2.3 10*3/uL (ref 0.7–4.0)
LYMPHS PCT: 33 % (ref 12–46)
MCH: 32.7 pg (ref 26.0–34.0)
MCHC: 34.3 g/dL (ref 30.0–36.0)
MCV: 95.4 fL (ref 78.0–100.0)
MONOS PCT: 8 % (ref 3–12)
Monocytes Absolute: 0.5 10*3/uL (ref 0.1–1.0)
Neutro Abs: 3.9 10*3/uL (ref 1.7–7.7)
Neutrophils Relative %: 57 % (ref 43–77)
Platelets: 259 10*3/uL (ref 150–400)
RBC: 3.91 MIL/uL (ref 3.87–5.11)
RDW: 11.2 % — ABNORMAL LOW (ref 11.5–15.5)
WBC: 6.9 10*3/uL (ref 4.0–10.5)

## 2014-06-19 LAB — RAPID URINE DRUG SCREEN, HOSP PERFORMED
AMPHETAMINES: NOT DETECTED
Barbiturates: NOT DETECTED
Benzodiazepines: NOT DETECTED
Cocaine: NOT DETECTED
Opiates: NOT DETECTED
Tetrahydrocannabinol: NOT DETECTED

## 2014-06-19 LAB — PREGNANCY, URINE: PREG TEST UR: NEGATIVE

## 2014-06-19 LAB — URINE MICROSCOPIC-ADD ON

## 2014-06-19 LAB — LIPASE, BLOOD: Lipase: 195 U/L — ABNORMAL HIGH (ref 11–59)

## 2014-06-19 MED ORDER — DICYCLOMINE HCL 10 MG/ML IM SOLN
20.0000 mg | Freq: Once | INTRAMUSCULAR | Status: AC
  Administered 2014-06-19: 20 mg via INTRAMUSCULAR
  Filled 2014-06-19: qty 2

## 2014-06-19 MED ORDER — PROMETHAZINE HCL 25 MG/ML IJ SOLN
12.5000 mg | Freq: Once | INTRAMUSCULAR | Status: AC
  Administered 2014-06-19: 12.5 mg via INTRAVENOUS
  Filled 2014-06-19: qty 1

## 2014-06-19 MED ORDER — MORPHINE SULFATE 4 MG/ML IJ SOLN
4.0000 mg | Freq: Once | INTRAMUSCULAR | Status: DC
  Filled 2014-06-19: qty 1

## 2014-06-19 MED ORDER — FENTANYL CITRATE 0.05 MG/ML IJ SOLN
50.0000 ug | Freq: Once | INTRAMUSCULAR | Status: AC
  Administered 2014-06-19: 50 ug via INTRAVENOUS
  Filled 2014-06-19: qty 2

## 2014-06-19 MED ORDER — HYDROCODONE-ACETAMINOPHEN 7.5-325 MG/15ML PO SOLN: 10.0000 mL | Freq: Four times a day (QID) | ORAL | Status: DC | PRN

## 2014-06-19 MED ORDER — OMEPRAZOLE 20 MG PO CPDR: 20.0000 mg | DELAYED_RELEASE_CAPSULE | Freq: Every day | ORAL | Status: DC

## 2014-06-19 MED ORDER — SUCRALFATE 1 GM/10ML PO SUSP: 1.0000 g | Freq: Three times a day (TID) | ORAL | Status: DC

## 2014-06-19 MED ORDER — FENTANYL CITRATE 0.05 MG/ML IJ SOLN
100.0000 ug | Freq: Once | INTRAMUSCULAR | Status: AC
Start: 2014-06-19 — End: 2014-06-19
  Administered 2014-06-19: 100 ug via INTRAVENOUS
  Filled 2014-06-19: qty 2

## 2014-06-19 NOTE — Discharge Instructions (Signed)
Gastritis, Adult °Gastritis is soreness and puffiness (inflammation) of the lining of the stomach. If you do not get help, gastritis can cause bleeding and sores (ulcers) in the stomach. °HOME CARE  °· Only take medicine as told by your doctor. °· If you were given antibiotic medicines, take them as told. Finish the medicines even if you start to feel better. °· Drink enough fluids to keep your pee (urine) clear or pale yellow. °· Avoid foods and drinks that make your problems worse. Foods you may want to avoid include: °¨ Caffeine or alcohol. °¨ Chocolate. °¨ Mint. °¨ Garlic and onions. °¨ Spicy foods. °¨ Citrus fruits, including oranges, lemons, or limes. °¨ Food containing tomatoes, including sauce, chili, salsa, and pizza. °¨ Fried and fatty foods. °· Eat small meals throughout the day instead of large meals. °GET HELP RIGHT AWAY IF:  °· You have black or dark red poop (stools). °· You throw up (vomit) blood. It may look like coffee grounds. °· You cannot keep fluids down. °· Your belly (abdominal) pain gets worse. °· You have a fever. °· You do not feel better after 1 week. °· You have any other questions or concerns. °MAKE SURE YOU:  °· Understand these instructions. °· Will watch your condition. °· Will get help right away if you are not doing well or get worse. °Document Released: 02/21/2008 Document Revised: 11/27/2011 Document Reviewed: 10/18/2011 °ExitCare® Patient Information ©2015 [employer]. This information is not intended to replace advice given to you by your health care provider. Make sure you discuss any questions you have with your health care provider. ° °

## 2014-07-25 ENCOUNTER — Emergency Department (HOSPITAL_COMMUNITY)
Admission: EM | Admit: 2014-07-25 | Discharge: 2014-07-26 | Disposition: A | Discharge status: Home / Self Care | Payer: Managed Care, Other (non HMO) | Attending: Emergency Medicine | Admitting: Emergency Medicine

## 2014-07-25 ENCOUNTER — Encounter (HOSPITAL_COMMUNITY): Payer: Self-pay | Admitting: Oncology

## 2014-07-25 ENCOUNTER — Emergency Department (HOSPITAL_COMMUNITY): Payer: Managed Care, Other (non HMO)

## 2014-07-25 DIAGNOSIS — Z8719 Personal history of other diseases of the digestive system: Secondary | ICD-10-CM | POA: Insufficient documentation

## 2014-07-25 DIAGNOSIS — M1990 Unspecified osteoarthritis, unspecified site: Secondary | ICD-10-CM | POA: Insufficient documentation

## 2014-07-25 DIAGNOSIS — Z793 Long term (current) use of hormonal contraceptives: Secondary | ICD-10-CM | POA: Diagnosis not present

## 2014-07-25 DIAGNOSIS — Z91040 Latex allergy status: Secondary | ICD-10-CM | POA: Insufficient documentation

## 2014-07-25 DIAGNOSIS — R1011 Right upper quadrant pain: Secondary | ICD-10-CM | POA: Diagnosis present

## 2014-07-25 DIAGNOSIS — Z8659 Personal history of other mental and behavioral disorders: Secondary | ICD-10-CM | POA: Insufficient documentation

## 2014-07-25 DIAGNOSIS — Z872 Personal history of diseases of the skin and subcutaneous tissue: Secondary | ICD-10-CM | POA: Insufficient documentation

## 2014-07-25 DIAGNOSIS — Z3202 Encounter for pregnancy test, result negative: Secondary | ICD-10-CM | POA: Diagnosis not present

## 2014-07-25 DIAGNOSIS — R112 Nausea with vomiting, unspecified: Secondary | ICD-10-CM | POA: Diagnosis not present

## 2014-07-25 DIAGNOSIS — Z720 Tobacco use: Secondary | ICD-10-CM | POA: Insufficient documentation

## 2014-07-25 DIAGNOSIS — Z8619 Personal history of other infectious and parasitic diseases: Secondary | ICD-10-CM | POA: Insufficient documentation

## 2014-07-25 DIAGNOSIS — Z8742 Personal history of other diseases of the female genital tract: Secondary | ICD-10-CM | POA: Insufficient documentation

## 2014-07-25 DIAGNOSIS — Z79899 Other long term (current) drug therapy: Secondary | ICD-10-CM | POA: Insufficient documentation

## 2014-07-25 DIAGNOSIS — G8929 Other chronic pain: Secondary | ICD-10-CM | POA: Diagnosis not present

## 2014-07-25 DIAGNOSIS — Z8669 Personal history of other diseases of the nervous system and sense organs: Secondary | ICD-10-CM | POA: Diagnosis not present

## 2014-07-25 DIAGNOSIS — Z87442 Personal history of urinary calculi: Secondary | ICD-10-CM | POA: Diagnosis not present

## 2014-07-25 DIAGNOSIS — R509 Fever, unspecified: Secondary | ICD-10-CM | POA: Diagnosis not present

## 2014-07-25 DIAGNOSIS — Z862 Personal history of diseases of the blood and blood-forming organs and certain disorders involving the immune mechanism: Secondary | ICD-10-CM | POA: Diagnosis not present

## 2014-07-25 LAB — CBC WITH DIFFERENTIAL/PLATELET
BASOS PCT: 1 % (ref 0–1)
Basophils Absolute: 0.1 10*3/uL (ref 0.0–0.1)
EOS ABS: 0.1 10*3/uL (ref 0.0–0.7)
Eosinophils Relative: 2 % (ref 0–5)
HEMATOCRIT: 37.9 % (ref 36.0–46.0)
HEMOGLOBIN: 12.8 g/dL (ref 12.0–15.0)
Lymphocytes Relative: 39 % (ref 12–46)
Lymphs Abs: 2.4 10*3/uL (ref 0.7–4.0)
MCH: 31.5 pg (ref 26.0–34.0)
MCHC: 33.8 g/dL (ref 30.0–36.0)
MCV: 93.3 fL (ref 78.0–100.0)
MONO ABS: 0.4 10*3/uL (ref 0.1–1.0)
Monocytes Relative: 7 % (ref 3–12)
Neutro Abs: 3.1 10*3/uL (ref 1.7–7.7)
Neutrophils Relative %: 51 % (ref 43–77)
Platelets: 279 10*3/uL (ref 150–400)
RBC: 4.06 MIL/uL (ref 3.87–5.11)
RDW: 11.8 % (ref 11.5–15.5)
WBC: 6.1 10*3/uL (ref 4.0–10.5)

## 2014-07-25 LAB — COMPREHENSIVE METABOLIC PANEL
ALBUMIN: 4.1 g/dL (ref 3.5–5.2)
ALT: 11 U/L (ref 0–35)
ANION GAP: 14 (ref 5–15)
AST: 15 U/L (ref 0–37)
Alkaline Phosphatase: 71 U/L (ref 39–117)
BILIRUBIN TOTAL: 1.3 mg/dL — AB (ref 0.3–1.2)
BUN: 10 mg/dL (ref 6–23)
CALCIUM: 9.9 mg/dL (ref 8.4–10.5)
CHLORIDE: 102 meq/L (ref 96–112)
CO2: 25 mEq/L (ref 19–32)
CREATININE: 0.67 mg/dL (ref 0.50–1.10)
GFR calc Af Amer: >90 mL/min (ref >90)
GFR calc non Af Amer: >90 mL/min (ref >90)
Glucose, Bld: 99 mg/dL (ref 70–99)
Potassium: 4.1 mEq/L (ref 3.7–5.3)
Sodium: 141 mEq/L (ref 137–147)
TOTAL PROTEIN: 8.4 g/dL — AB (ref 6.0–8.3)

## 2014-07-25 LAB — LIPASE, BLOOD: Lipase: 42 U/L (ref 11–59)

## 2014-07-25 MED ORDER — PROMETHAZINE HCL 25 MG/ML IJ SOLN
25.0000 mg | Freq: Once | INTRAMUSCULAR | Status: AC
  Administered 2014-07-25: 25 mg via INTRAVENOUS
  Filled 2014-07-25: qty 1

## 2014-07-25 MED ORDER — HYDROMORPHONE HCL 1 MG/ML IJ SOLN
1.0000 mg | Freq: Once | INTRAMUSCULAR | Status: AC
  Administered 2014-07-25: 1 mg via INTRAVENOUS
  Filled 2014-07-25: qty 1

## 2014-07-25 NOTE — ED Notes (Signed)
Pt is c/o pain under right ribcage.  Pt reports fever/chills, nausea and vomiting.  Pain is rated 8/10 sharp and throbbing in nature.

## 2014-07-26 LAB — POC URINE PREG, ED: Preg Test, Ur: NEGATIVE

## 2014-07-26 MED ORDER — ONDANSETRON HCL 4 MG PO TABS: 4.0000 mg | ORAL_TABLET | Freq: Four times a day (QID) | ORAL | Status: DC

## 2014-07-26 MED ORDER — NAPROXEN 500 MG PO TABS
500.0000 mg | ORAL_TABLET | Freq: Once | ORAL | Status: DC
  Filled 2014-07-26: qty 1

## 2014-07-26 MED ORDER — ONDANSETRON 4 MG PO TBDP
4.0000 mg | ORAL_TABLET | Freq: Once | ORAL | Status: DC
  Filled 2014-07-26: qty 1

## 2014-07-26 MED ORDER — FAMOTIDINE 20 MG PO TABS: 20.0000 mg | ORAL_TABLET | Freq: Two times a day (BID) | ORAL | Status: DC

## 2014-07-26 NOTE — ED Provider Notes (Signed)
CSN: [id number]     Arrival date & time 07/25/14  2219 History   First MD Initiated Contact with Patient 07/25/14 2313     Chief Complaint  Patient presents with  . Abdominal Pain     (Consider location/radiation/quality/duration/timing/severity/associated sxs/prior Treatment) HPI  Pt is a 23yo female with hx of IBS, bipolar 1 disorder, kidney stones, PID, chlamydia, C. Diff, anxiety, anemia, depression,fibromyalgia and seizures presenting to ED with c/o RUQ pain that started earlier today. Has been intermittent for "months"  Pt states she has had associated fever, chills, nausea and vomiting. Reports Tmax 102 yesterday. Pt afebrile upon arrival to ED, reports taking acetaminophen 4 hours PTA but states it did not relieve pain as pain is 10/10. Pt states she would like a referral to a GI specialist as she has not received one from her PCP yet.  States she recently moved to the area from [place].  Denies hx of abdominal surgeries. Denies hx of sick contacts or recent travel. Denies dysuria, hematuria or vaginal symptoms. LMP 2 weeks ago.   Past Medical History  Diagnosis Date  . IBS (irritable bowel syndrome)   . Bipolar 1 disorder   . Mononucleosis   . Kidney stones   . PID (acute pelvic inflammatory disease) 04/28/2012  . Chlamydia 04/29/2012  . C. difficile diarrhea   . Anemia   . Anxiety   . Arthritis   . Depression   . Ulcer   . Abnormal Pap smear     colposcopy  . HPV in female   . Fibromyalgia   . Seizures     October 2014 was last one    Past Surgical History  Procedure Laterality Date  . Kidney stone surgery    . Tonsillectomy    . Inner ear surgery    . Wisdom tooth extraction    . Colposcopy     Family History  Problem Relation Age of Onset  . Bipolar disorder Mother   . Cancer Father   . ADD / ADHD Brother   . Cancer Maternal Grandmother   . Cancer Maternal Grandfather   . Cancer Paternal Grandmother   . Cancer Paternal Grandfather    History   Substance Use Topics  . Smoking status: Current Every Day Smoker -- 0.50 packs/day for 5 years    Types: Cigarettes  . Smokeless tobacco: Never Used  . Alcohol Use: No   OB History    Gravida Para Term Preterm AB TAB SAB Ectopic Multiple Living   0              Review of Systems  Constitutional: Positive for fever and chills. Negative for diaphoresis.  Respiratory: Negative for cough and shortness of breath.   Gastrointestinal: Positive for nausea, vomiting and abdominal pain. Negative for diarrhea, constipation and blood in stool.  Genitourinary: Negative for dysuria, hematuria and flank pain.  Musculoskeletal: Negative for myalgias and back pain.  All other systems reviewed and are negative.     Allergies  Azithromycin; Prednisone; Bactrim; Doxycycline hyclate; Ibuprofen; Latex; Morphine and related; and Toradol  Home Medications   Prior to Admission medications   Medication Sig Start Date End Date Taking? Authorizing Provider  norgestimate-ethinyl estradiol (ORTHO-CYCLEN,SPRINTEC,PREVIFEM) 0.25-35 MG-MCG tablet Take 1 tablet by mouth daily.   Yes Historical Provider, MD  acetaminophen (TYLENOL) 500 MG tablet Take 1,000 mg by mouth every 6 (six) hours as needed for mild pain.    Historical Provider, MD  famotidine (PEPCID) 20 MG tablet Take  1 tablet (20 mg total) by mouth 2 (two) times daily. 07/26/14   Junius FinnerErin O'Malley, PA-C  HYDROcodone-acetaminophen (HYCET) 7.5-325 mg/15 ml solution Take 10 mLs by mouth every 6 (six) hours as needed for moderate pain or severe pain. 06/19/14   April K Palumbo-Rasch, MD  omeprazole (PRILOSEC) 20 MG capsule Take 1 capsule (20 mg total) by mouth daily. 06/19/14   April K Palumbo-Rasch, MD  ondansetron (ZOFRAN ODT) 4 MG disintegrating tablet 4mg  ODT q4 hours prn nausea/vomit 05/28/14   Jimmye Normanavid John Smith, NP  ondansetron (ZOFRAN) 4 MG tablet Take 1 tablet (4 mg total) by mouth every 6 (six) hours. 07/26/14   Junius FinnerErin O'Malley, PA-C  oxyCODONE-acetaminophen  (PERCOCET/ROXICET) 5-325 MG per tablet Take 1 tablet by mouth every 6 (six) hours as needed for severe pain. 05/28/14   Jimmye Normanavid John Smith, NP  sucralfate (CARAFATE) 1 GM/10ML suspension Take 10 mLs (1 g total) by mouth 4 (four) times daily -  with meals and at bedtime. 06/19/14   April K Palumbo-Rasch, MD   BP 115/68 mmHg  Pulse 103  Temp(Src) 98 F (36.7 C) (Oral)  Resp 22  Ht 5\' 4"  (1.626 m)  Wt 118 lb (53.524 kg)  BMI 20.24 kg/m2  SpO2 100%  LMP 07/13/2014 Physical Exam  Constitutional: She appears well-developed and well-nourished. No distress.  Pt sitting in exam bed, holding right side of abdomen.  HENT:  Head: Normocephalic and atraumatic.  Eyes: Conjunctivae are normal. No scleral icterus.  Neck: Normal range of motion.  Cardiovascular: Normal rate, regular rhythm and normal heart sounds.   Pulmonary/Chest: Effort normal and breath sounds normal. No respiratory distress. She has no wheezes. She has no rales. She exhibits no tenderness.  Abdominal: Soft. Bowel sounds are normal. She exhibits no distension and no mass. There is tenderness ( RUQ pain). There is no rebound and no guarding.  Musculoskeletal: Normal range of motion.  Neurological: She is alert.  Skin: Skin is warm and dry. She is not diaphoretic.  Nursing note and vitals reviewed.   ED Course  Procedures (including critical care time) Labs Review Labs Reviewed  COMPREHENSIVE METABOLIC PANEL - Abnormal; Notable for the following:    Total Protein 8.4 (*)    Total Bilirubin 1.3 (*)    All other components within normal limits  CBC WITH DIFFERENTIAL  LIPASE, BLOOD  POC URINE PREG, ED    Imaging Review No results found.   EKG Interpretation None      MDM   Final diagnoses:  RUQ abdominal pain  Chronic pain    Pt c/o RUQ pain, intermittent for "months" on exam, pt appears uncomfortable with severe tenderness in RUQ.  Concern for cholecystitis vs pancreatitis, vitals: WNL, afebrile.   Labs: WNL.   Low concern for cholecystitis or pancreatitis. Medical records reviewed from [facility] with Dr. Nicanor AlconPalumbo. Per medical records pt [facility]  Pt has had numerous CT scans and Ultrasounds w/o evidence of kidney stones, gall stones or gall sludge.  No indication for repeat imaging as most recent [facility] was normal.  Reviewed [place] Controlled Substance Database.  Pt has had multiple prescriptions from various provider for narcotic pain medication. Discussed results with pt and advised pt there is no indication for narcotic pain medication at this time as labs and imaging are normal. At this point with reports of pain being intermittent  for "months" pain is considered chronic in nature.  Provided community resource guide that includes resources for Pain Management. Also provided pt with requested referral to GI, advised her to call Dr. Kenna GilbertMann's office to schedule f/u appointment for chronic RUQ pain.  Pt repeatedly requested additional narcotic pain medication while in ED and for discharge.  Pt offered zofran and naproxen in ED, pt refused.  Rx: zofran and pepcid. Home care instructions provided.    Junius Finnerrin O'Malley, PA-C 07/26/14 [id number]  Per nursing staff, pt threw her discharge papers into trash prior to leaving the ED.   Junius Finnerrin O'Malley, PA-C 07/26/14 0112  April K Palumbo-Rasch, MD 07/26/14 [phone]

## 2014-07-26 NOTE — ED Notes (Signed)
Pt refused to have vital signs taken again

## 2014-07-26 NOTE — Discharge Instructions (Signed)
ED Resources °Pain Management Centers/Resources in the Surrounding Area ° °[facility] °[address], [address] °[place] [address] °336-765-6181 ° °[facility] °[address] °[place] [address] °336-297-2271 ° °[facility] °[address] °[place], [place] [address] °336-282-0132 ° °[facility] °[address] °[place] [address] °336-268-2530 ° °[facility] °[address] °[place] [address] °336-635-6810 ° ° °Emergency Department Resource Guide °1) Find a Doctor and Pay Out of Pocket °Although you won't have to find out who is covered by your insurance plan, it is a good idea to ask around and get recommendations. You will then need to call the office and see if the doctor you have chosen will accept you as a new patient and what types of options they offer for patients who are self-pay. Some doctors offer discounts or will set up payment plans for their patients who do not have insurance, but you will need to ask so you aren't surprised when you get to your appointment. ° °2) Contact Your Local Health Department °Not all health departments have doctors that can see patients for sick visits, but many do, so it is worth a call to see if yours does. If you don't know where your local health department is, you can check in your phone book. The CDC also has a tool to help you locate your state's health department, and many state websites also have listings of all of their local health departments. ° °3) Find a Walk-in Clinic °If your illness is not likely to be very severe or complicated, you may want to try a walk in clinic. These are popping up all over the country in pharmacies, drugstores, and shopping centers. They're usually staffed by nurse practitioners or physician assistants that have been trained to treat common illnesses and complaints. They're  usually fairly quick and inexpensive. However, if you have serious medical issues or chronic medical problems, these are probably not your best option. ° °No Primary Care Doctor: °- Call [facility] at  832-8000 - they can help you locate a primary care doctor that  accepts your insurance, provides certain services, etc. °- Physician Referral Service- 1-800-533-3463 ° °Chronic Pain Problems: °Organization         Address  Phone   Notes  °[facility]  (336) 297-2271 Patients need to be referred by their primary care doctor.  ° °Medication Assistance: °Organization         Address  Phone   Notes  °[place] [facility] [address] °[place], [place] [address] (336) 641-8030 --Must be a resident of [place] °-- Must have NO insurance coverage whatsoever (no Medicaid/ Medicare, etc.) °-- The pt. MUST have a primary care doctor that directs their care regularly and follows them in the community °  °[facility]  (866) 331-1348   °[employer]  (888) 892-1162   ° °Agencies that provide inexpensive medical care: °Organization         Address                                                       Phone                                                                              Notes  ° [facility]  (336) 832-8035   °[facility]    (336) 832-7272   °[facility] [address] °[place], [place] [address] (336) 832-4777   °[facility] [address], °[place] (336) 271-4999   °[facility]    (336) 373-0678   °[facility]    (336) 272-1050   °[facility] ° [address], [place] Phone:  (336) 832-4444, Fax:  (336) 832-4440 Hours of Operation:  9 am - 6 pm, M-F.  Also accepts Medicaid/Medicare and self-pay.  °[facility] ° [address], [place] Phone: (336) 832-3150, Fax: (336) 832-3151. Hours of  Operation:  8:30 am - 5:30 pm, M-F.  Also accepts Medicaid and self-pay.  °[facility] [place] [address], [place] Phone: (336) 878-6027   °[facility] [address], [place], [place] (336)723-1848, Ext. 123 Mondays & Thursdays: 7-9 AM.  First 15 patients are seen on a first come, first serve basis. °  ° °Medicaid-accepting [place] Providers: ° °Organization         Address                                                                       Phone                               Notes  °[facility] [address], [place] (336) 641-2100 Also accepts self-pay patients.  °[facility] [address], [place] ° (336) 856-9996   °[facility] [address], [place] (336) 288-8857   °[facility] [address] [place] [address], [place] (336) 299-7000   °Veita Bland [address], [place]  ° (336) 373-1557 Only accepts [place] Access Medicaid patients after they have their name applied to their card.  ° °Self-Pay (no insurance) in [place]: °  °Organization         Address                                                     Phone               Notes  °Sickle Cell Patients, [facility] [address], [place] (336) 832-1970   °[facility] [address], [place] (336) 832-4400   °[facility] [place] [facility] ° [address], [place] (336) 992-4800   °[facility]/Dr. Osei-Bonsu ° [address] [place] [address], [place] or [address], [place] (336) 841-8500 Phone number for both [place] and [place] locations is the same.  °[facility] [address], [place] (336) 299-0000   °[facility] [address] [place] [address],  or [address] (336) 852-7530 °(336) 878-2260   °[facility] [address], [place] (336)  350-1642, phone; (336) 294-5005, fax Sees patients 1st and 3rd Saturday of every month.  Must not qualify for public or private insurance (i.e. Medicaid, Medicare, [place] Health Choice, Veterans' Benefits) • Household income should be no more than 200% of the poverty level •The clinic cannot treat you if you are pregnant or think you are pregnant • Sexually transmitted diseases are not treated at the clinic.  ° ° °Dental Care: °Organization         Address                                  Phone                       Notes  °[place] [facility] [address], [place] (336) 641-6152 Accepts children up to age 21 who are enrolled in Medicaid or [place] Health Choice; pregnant women with a Medicaid card; and children who have applied for Medicaid or [place] Health Choice, but were declined, whose parents can pay a reduced fee at time of service.  °[facility] [place]  [address], [place] (336) 641-7733 Accepts children up to age 21 who are enrolled in Medicaid or [place] Health Choice; pregnant women with a Medicaid card; and children who have applied for Medicaid or [place] Health Choice, but were declined, whose parents can pay a reduced fee at time of service.  °[facility] ° [address], [place] (336) 641-4533 Patients are seen by appointment only. Walk-ins are not accepted. [facility] will see patients 18 years of age and older. °Monday - Tuesday (8am-5pm) °Most Wednesdays (8:30-5pm) °$30 per visit, cash only  °[facility] ° [address], [place] (336) 641-4533 Patients are seen by appointment only. Walk-ins are not accepted. [facility] will see patients 18 years of age and older. °One Wednesday Evening (Monthly: Volunteer Based).  $30 per visit, cash only  °[facility]  (919) 537-3737 for adults; Children under age 4, call Graduate  Pediatric Dentistry at (919) [phone]. Children aged 4-14, please call (919) [phone] to request a pediatric application. ° Dental services are provided in all areas of dental care including fillings, crowns and bridges, complete and partial dentures, implants, gum treatment, root canals, and extractions. Preventive care is also provided. Treatment is provided to both adults and children. °Patients are selected via a lottery and there is often a waiting list. °  °[facility] [address], °[place] ° (336) 763-8833 [email or web address] °  °[facility] [address], [place], [place] (336)723-1848, Ext. 123 Second and Fourth Thursday of each month, opens at 6:30 AM; Clinic ends at 9 AM.  Patients are seen on a first-come first-served basis, and a limited number are seen during each clinic.  ° °[facility] ° [address], [place], [place] (336) 723-7904   Eligibility Requirements °You must have lived in [place], or [place] counties for at least the last three months. °  You cannot be eligible for state or federal sponsored healthcare insurance, including Veterans Administration, Medicaid, or Medicare. °  You generally cannot be eligible for healthcare insurance through your employer.  °  How to apply: °Eligibility screenings are held every   Tuesday and Wednesday afternoon from 1:00 pm until 4:00 pm. You do not need an appointment for the interview!  °[facility] [address], [place], [place] 336-631-2330   °[facility]  336-342-8273   °[facility]  336-703-3100   °[facility]  336-570-6415   ° °Behavioral Health Resources in the Community: °Intensive Outpatient Programs °Organization         Address                                              Phone              Notes  °[facility] [address], [place], [place] 336-878-6098   °[facility] [address], [place], [place] 336-832-9800   °[facility] [address], [place], [place] ° 336-882-2125   °[facility] [address],  °[place], [place] 1-800-853-5163 or 336-641-4981   °Substance Abuse Resources °Organization         Address                                Phone  Notes  °[facility]  336-882-2125   °[facility]  336-784-9470   °[facility]  336-285-9073   °[facility]  336-845-3988   °Residential & Outpatient Substance Abuse Program  1-800-659-3381   °Psychological Services °Organization         Address                                  Phone                Notes  °[facility]  336- 832-9600   °[facility]  336- 378-7881   °[facility] [address], [place] 1-800-853-5163 or 336-641-4981   ° °Mobile Crisis Teams °Organization         Address  Phone  Notes  °[facility], Mobile Crisis Care Unit  1-877-626-1772   °Assertive °[facility] ° [address] [place], [place] 336-834-9664   °Sharon DeEsch [address] °[place] 336-554-5454   ° °Self-Help/Support Groups °Organization         Address                         Phone             Notes  °[facility] - variety of support groups  336- 373-1402 Call for more information  °Narcotics Anonymous (NA), [facility] [address], °[place]  2 meetings at this location  ° °Residential Treatment Programs °Organization         Address                                                    Phone              Notes  °[facility] [address],    °[place]  1-866-801-8205   °[facility] ° [address], [place], [place] 704-293-8524   °[facility] [address], [place] 336-845-3988 Admissions: 8am-3pm M-F  °[facility] [address]    °[place], [place]  336-841-1104   °[facility] [address], [place], [place] 336-379-7146   °[facility] [address]  °[place], [place] 336-285-9073   °[facility] - Intensive Outpatient [address], [place], [place] 336-852-3033   °[facility] [address]  °[place], [place] 1-877-615-2722 or 336-784-9470   °[facility] ([facility] [address] [place], [place] 336-227-7417 Accepts Medicaid  °[facility] [address]  ° [place] 1-800-659-3381 Substance Abuse/Addiction Treatment  ° °[place] Behavioral Health Resources °Organization         Address                                                            Phone                    Notes  °[facility]  (888) 581-9988   °Julie Brannon, PhD [address] [place], [place]   (336) 349-5553 or (336) 951-0000   °[facility]   [address] °[place], [place] (336) 349-4454   °[facility] [address], [place], [place] (336) 342-8316 Insurance/Medicaid/sponsorship through [facility]  °[facility] [address]                                    [place], [place] (336) 342-8316 Therapy/tele-psych/case  °[facility] [address]  ° [place], [place] (336) 349-2233    °Dr. Arfeen  (336) 349-4544   °[facility] [place]  [employer] [place] [facility] 1) [address], [place] °2) [address], [place] °3)  [address], [place] (336) 349-3220 °(336) 342-7768 ° °(336) 342-8140   °[place] [facility] (336) 342-1394 or (336) 342-3537 (After Hours)    ° ° ° ° °

## 2014-09-29 ENCOUNTER — Encounter (HOSPITAL_COMMUNITY): Payer: Self-pay | Admitting: Emergency Medicine

## 2014-09-29 ENCOUNTER — Emergency Department (HOSPITAL_COMMUNITY)
Admission: EM | Admit: 2014-09-29 | Discharge: 2014-09-29 | Disposition: A | Discharge status: Home / Self Care | Payer: Managed Care, Other (non HMO) | Attending: Emergency Medicine | Admitting: Emergency Medicine

## 2014-09-29 DIAGNOSIS — Z79899 Other long term (current) drug therapy: Secondary | ICD-10-CM | POA: Insufficient documentation

## 2014-09-29 DIAGNOSIS — Z87442 Personal history of urinary calculi: Secondary | ICD-10-CM | POA: Insufficient documentation

## 2014-09-29 DIAGNOSIS — M1990 Unspecified osteoarthritis, unspecified site: Secondary | ICD-10-CM | POA: Insufficient documentation

## 2014-09-29 DIAGNOSIS — R112 Nausea with vomiting, unspecified: Secondary | ICD-10-CM | POA: Insufficient documentation

## 2014-09-29 DIAGNOSIS — Z8742 Personal history of other diseases of the female genital tract: Secondary | ICD-10-CM | POA: Insufficient documentation

## 2014-09-29 DIAGNOSIS — Z3202 Encounter for pregnancy test, result negative: Secondary | ICD-10-CM | POA: Insufficient documentation

## 2014-09-29 DIAGNOSIS — R109 Unspecified abdominal pain: Secondary | ICD-10-CM | POA: Insufficient documentation

## 2014-09-29 DIAGNOSIS — Z8659 Personal history of other mental and behavioral disorders: Secondary | ICD-10-CM | POA: Insufficient documentation

## 2014-09-29 DIAGNOSIS — Z720 Tobacco use: Secondary | ICD-10-CM | POA: Insufficient documentation

## 2014-09-29 DIAGNOSIS — Z862 Personal history of diseases of the blood and blood-forming organs and certain disorders involving the immune mechanism: Secondary | ICD-10-CM | POA: Insufficient documentation

## 2014-09-29 DIAGNOSIS — Z8619 Personal history of other infectious and parasitic diseases: Secondary | ICD-10-CM | POA: Insufficient documentation

## 2014-09-29 DIAGNOSIS — K589 Irritable bowel syndrome without diarrhea: Secondary | ICD-10-CM | POA: Insufficient documentation

## 2014-09-29 DIAGNOSIS — M549 Dorsalgia, unspecified: Secondary | ICD-10-CM | POA: Insufficient documentation

## 2014-09-29 DIAGNOSIS — Z91040 Latex allergy status: Secondary | ICD-10-CM | POA: Insufficient documentation

## 2014-09-29 LAB — COMPREHENSIVE METABOLIC PANEL
ALK PHOS: 73 U/L (ref 39–117)
ALT: 16 U/L (ref 0–35)
AST: 22 U/L (ref 0–37)
Albumin: 4.4 g/dL (ref 3.5–5.2)
Anion gap: 9 (ref 5–15)
BUN: 9 mg/dL (ref 6–23)
CO2: 23 mmol/L (ref 19–32)
Calcium: 9 mg/dL (ref 8.4–10.5)
Chloride: 104 mEq/L (ref 96–112)
Creatinine, Ser: 0.67 mg/dL (ref 0.50–1.10)
GLUCOSE: 86 mg/dL (ref 70–99)
POTASSIUM: 3.8 mmol/L (ref 3.5–5.1)
Sodium: 136 mmol/L (ref 135–145)
Total Bilirubin: 2.5 mg/dL — ABNORMAL HIGH (ref 0.3–1.2)
Total Protein: 7.6 g/dL (ref 6.0–8.3)

## 2014-09-29 LAB — URINALYSIS, ROUTINE W REFLEX MICROSCOPIC
BILIRUBIN URINE: NEGATIVE
Glucose, UA: NEGATIVE mg/dL
KETONES UR: NEGATIVE mg/dL
NITRITE: NEGATIVE
Protein, ur: 30 mg/dL — AB
Specific Gravity, Urine: 1.01 (ref 1.005–1.030)
Urobilinogen, UA: 0.2 mg/dL (ref 0.0–1.0)
pH: 6.5 (ref 5.0–8.0)

## 2014-09-29 LAB — CBC
HCT: 39.8 % (ref 36.0–46.0)
Hemoglobin: 13.1 g/dL (ref 12.0–15.0)
MCH: 31.5 pg (ref 26.0–34.0)
MCHC: 32.9 g/dL (ref 30.0–36.0)
MCV: 95.7 fL (ref 78.0–100.0)
PLATELETS: 272 10*3/uL (ref 150–400)
RBC: 4.16 MIL/uL (ref 3.87–5.11)
RDW: 12.2 % (ref 11.5–15.5)
WBC: 12.3 10*3/uL — ABNORMAL HIGH (ref 4.0–10.5)

## 2014-09-29 LAB — URINE MICROSCOPIC-ADD ON

## 2014-09-29 LAB — PREGNANCY, URINE: Preg Test, Ur: NEGATIVE

## 2014-09-29 MED ORDER — HYDROMORPHONE HCL 1 MG/ML IJ SOLN
1.0000 mg | Freq: Once | INTRAMUSCULAR | Status: AC
  Administered 2014-09-29: 1 mg via INTRAVENOUS
  Filled 2014-09-29: qty 1

## 2014-09-29 MED ORDER — ONDANSETRON HCL 4 MG/2ML IJ SOLN
4.0000 mg | Freq: Once | INTRAMUSCULAR | Status: AC
  Administered 2014-09-29: 4 mg via INTRAVENOUS
  Filled 2014-09-29: qty 2

## 2014-09-29 MED ORDER — SODIUM CHLORIDE 0.9 % IV BOLUS (SEPSIS)
1000.0000 mL | INTRAVENOUS | Status: AC
  Administered 2014-09-29: 1000 mL via INTRAVENOUS

## 2014-09-29 NOTE — ED Notes (Signed)
Pt [facility], pt had CT done there and was told she may have kidney stones. Pt now c/o back and left flank pain.p t states there is blood in urine now.

## 2014-09-29 NOTE — Discharge Instructions (Signed)
Abdominal Pain, Women °Abdominal (stomach, pelvic, or belly) pain can be caused by many things. It is important to tell your doctor: °· The location of the pain. °· Does it come and go or is it present all the time? °· Are there things that start the pain (eating certain foods, exercise)? °· Are there other symptoms associated with the pain (fever, nausea, vomiting, diarrhea)? °All of this is helpful to know when trying to find the cause of the pain. °CAUSES  °· Stomach: virus or bacteria infection, or ulcer. °· Intestine: appendicitis (inflamed appendix), regional ileitis (Crohn's disease), ulcerative colitis (inflamed colon), irritable bowel syndrome, diverticulitis (inflamed diverticulum of the colon), or cancer of the stomach or intestine. °· Gallbladder disease or stones in the gallbladder. °· Kidney disease, kidney stones, or infection. °· Pancreas infection or cancer. °· Fibromyalgia (pain disorder). °· Diseases of the female organs: °¨ Uterus: fibroid (non-cancerous) tumors or infection. °¨ Fallopian tubes: infection or tubal pregnancy. °¨ Ovary: cysts or tumors. °¨ Pelvic adhesions (scar tissue). °¨ Endometriosis (uterus lining tissue growing in the pelvis and on the pelvic organs). °¨ Pelvic congestion syndrome (female organs filling up with blood just before the menstrual period). °¨ Pain with the menstrual period. °¨ Pain with ovulation (producing an egg). °¨ Pain with an IUD (intrauterine device, birth control) in the uterus. °¨ Cancer of the female organs. °· Functional pain (pain not caused by a disease, may improve without treatment). °· Psychological pain. °· Depression. °DIAGNOSIS  °Your doctor will decide the seriousness of your pain by doing an examination. °· Blood tests. °· X-rays. °· Ultrasound. °· CT scan (computed tomography, special type of X-ray). °· MRI (magnetic resonance imaging). °· Cultures, for infection. °· Barium enema (dye inserted in the large intestine, to better view it with  X-rays). °· Colonoscopy (looking in intestine with a lighted tube). °· Laparoscopy (minor surgery, looking in abdomen with a lighted tube). °· Major abdominal exploratory surgery (looking in abdomen with a large incision). °TREATMENT  °The treatment will depend on the cause of the pain.  °· Many cases can be observed and treated at home. °· Over-the-counter medicines recommended by your caregiver. °· Prescription medicine. °· Antibiotics, for infection. °· Birth control pills, for painful periods or for ovulation pain. °· Hormone treatment, for endometriosis. °· Nerve blocking injections. °· Physical therapy. °· Antidepressants. °· Counseling with a psychologist or psychiatrist. °· Minor or major surgery. °HOME CARE INSTRUCTIONS  °· Do not take laxatives, unless directed by your caregiver. °· Take over-the-counter pain medicine only if ordered by your caregiver. Do not take aspirin because it can cause an upset stomach or bleeding. °· Try a clear liquid diet (broth or water) as ordered by your caregiver. Slowly move to a bland diet, as tolerated, if the pain is related to the stomach or intestine. °· Have a thermometer and take your temperature several times a day, and record it. °· Bed rest and sleep, if it helps the pain. °· Avoid sexual intercourse, if it causes pain. °· Avoid stressful situations. °· Keep your follow-up appointments and tests, as your caregiver orders. °· If the pain does not go away with medicine or surgery, you may try: °¨ Acupuncture. °¨ Relaxation exercises (yoga, meditation). °¨ Group therapy. °¨ Counseling. °SEEK MEDICAL CARE IF:  °· You notice certain foods cause stomach pain. °· Your home care treatment is not helping your pain. °· You need stronger pain medicine. °· You want your IUD removed. °· You feel faint or   lightheaded. °· You develop nausea and vomiting. °· You develop a rash. °· You are having side effects or an allergy to your medicine. °SEEK IMMEDIATE MEDICAL CARE IF:  °· Your  pain does not go away or gets worse. °· You have a fever. °· Your pain is felt only in portions of the abdomen. The right side could possibly be appendicitis. The left lower portion of the abdomen could be colitis or diverticulitis. °· You are passing blood in your stools (bright red or black tarry stools, with or without vomiting). °· You have blood in your urine. °· You develop chills, with or without a fever. °· You pass out. °MAKE SURE YOU:  °· Understand these instructions. °· Will watch your condition. °· Will get help right away if you are not doing well or get worse. °Document Released: 07/02/2007 Document Revised: 01/19/2014 Document Reviewed: 07/22/2009 °ExitCare® Patient Information ©2015 [employer]. This information is not intended to replace advice given to you by your health care provider. Make sure you discuss any questions you have with your health care provider. ° °

## 2014-09-29 NOTE — ED Provider Notes (Signed)
CSN: [id number]     Arrival date & time 09/29/14  1808 History   First MD Initiated Contact with Patient 09/29/14 1923     Chief Complaint  Patient presents with  . Back Pain  . Flank Pain     (Consider location/radiation/quality/duration/timing/severity/associated sxs/prior Treatment) Patient is a 24 y.o. female presenting with flank pain. The history is provided by the patient.  Flank Pain This is a recurrent problem. The current episode started 3 to 5 hours ago. The problem occurs constantly. The problem has not changed since onset.Associated symptoms include abdominal pain. Pertinent negatives include no chest pain, no headaches and no shortness of breath. Nothing aggravates the symptoms. Nothing relieves the symptoms. She has tried nothing for the symptoms. The treatment provided no relief.    Past Medical History  Diagnosis Date  . IBS (irritable bowel syndrome)   . Bipolar 1 disorder   . Mononucleosis   . Kidney stones   . PID (acute pelvic inflammatory disease) 04/28/2012  . Chlamydia 04/29/2012  . C. difficile diarrhea   . Anemia   . Anxiety   . Arthritis   . Depression   . Ulcer   . Abnormal Pap smear     colposcopy  . HPV in female   . Fibromyalgia   . Seizures     October 2014 was last one    Past Surgical History  Procedure Laterality Date  . Kidney stone surgery    . Tonsillectomy    . Inner ear surgery    . Wisdom tooth extraction    . Colposcopy     Family History  Problem Relation Age of Onset  . Bipolar disorder Mother   . Cancer Father   . ADD / ADHD Brother   . Cancer Maternal Grandmother   . Cancer Maternal Grandfather   . Cancer Paternal Grandmother   . Cancer Paternal Grandfather    History  Substance Use Topics  . Smoking status: Current Every Day Smoker -- 0.50 packs/day for 5 years    Types: Cigarettes  . Smokeless tobacco: Never Used  . Alcohol Use: No   OB History    Gravida Para Term Preterm AB TAB SAB Ectopic Multiple Living    0              Review of Systems  Constitutional: Negative for fever and fatigue.  HENT: Negative for congestion and drooling.   Eyes: Negative for pain.  Respiratory: Negative for cough and shortness of breath.   Cardiovascular: Negative for chest pain.  Gastrointestinal: Positive for nausea, vomiting and abdominal pain. Negative for diarrhea.  Genitourinary: Positive for flank pain. Negative for dysuria and hematuria.  Musculoskeletal: Negative for back pain, gait problem and neck pain.  Skin: Negative for color change.  Neurological: Negative for dizziness and headaches.  Hematological: Negative for adenopathy.  Psychiatric/Behavioral: Negative for behavioral problems.  All other systems reviewed and are negative.     Allergies  Azithromycin; Prednisone; Bactrim; Doxycycline hyclate; Ibuprofen; Latex; Morphine and related; and Toradol  Home Medications   Prior to Admission medications   Medication Sig Start Date End Date Taking? Authorizing Provider  [place] alk-PHENObarbital (DONNATAL) 16.2 MG tablet Take 1 tablet by mouth 2 (two) times daily. For 10 days.   Yes Historical Provider, MD  norgestimate-ethinyl estradiol (ORTHO-CYCLEN,SPRINTEC,PREVIFEM) 0.25-35 MG-MCG tablet Take 1 tablet by mouth daily.   Yes Historical Provider, MD  acetaminophen (TYLENOL) 500 MG tablet Take 1,000 mg by mouth every 6 (six) hours as  needed for mild pain.    Historical Provider, MD  famotidine (PEPCID) 20 MG tablet Take 1 tablet (20 mg total) by mouth 2 (two) times daily. Patient not taking: Reported on 09/29/2014 07/26/14   Noland Fordyce, PA-C  HYDROcodone-acetaminophen (HYCET) 7.5-325 mg/15 ml solution Take 10 mLs by mouth every 6 (six) hours as needed for moderate pain or severe pain. Patient not taking: Reported on 09/29/2014 06/19/14   April K Palumbo-Rasch, MD  omeprazole (PRILOSEC) 20 MG capsule Take 1 capsule (20 mg total) by mouth daily. Patient not taking: Reported on 09/29/2014 06/19/14    April K Palumbo-Rasch, MD  ondansetron [facility] ODT) 4 MG disintegrating tablet 78m ODT q4 hours prn nausea/vomit Patient not taking: Reported on 09/29/2014 05/28/14   DNorman Herrlich NP  ondansetron (ZOFRAN) 4 MG tablet Take 1 tablet (4 mg total) by mouth every 6 (six) hours. Patient not taking: Reported on 09/29/2014 07/26/14   ENoland Fordyce PA-C  oxyCODONE-acetaminophen (PERCOCET/ROXICET) 5-325 MG per tablet Take 1 tablet by mouth every 6 (six) hours as needed for severe pain. Patient not taking: Reported on 09/29/2014 05/28/14   DNorman Herrlich NP  sucralfate (CARAFATE) 1 GM/10ML suspension Take 10 mLs (1 g total) by mouth 4 (four) times daily -  with meals and at bedtime. Patient not taking: Reported on 09/29/2014 06/19/14   April K Palumbo-Rasch, MD   BP 97/81 mmHg  Pulse 79  Temp(Src) 98 F (36.7 C) (Oral)  Resp 19  SpO2 97%  LMP 09/15/2014 (Exact Date) Physical Exam  Constitutional: She is oriented to person, place, and time. She appears well-developed and well-nourished.  HENT:  Head: Normocephalic.  Mouth/Throat: Oropharynx is clear and moist. No oropharyngeal exudate.  Eyes: Conjunctivae and EOM are normal. Pupils are equal, round, and reactive to light.  Neck: Normal range of motion. Neck supple.  Cardiovascular: Normal rate, regular rhythm, normal heart sounds and intact distal pulses.  Exam reveals no gallop and no friction rub.   No murmur heard. Pulmonary/Chest: Effort normal and breath sounds normal. No respiratory distress. She has no wheezes.  Abdominal: Soft. Bowel sounds are normal. There is no tenderness. There is no rebound and no guarding.  Musculoskeletal: Normal range of motion. She exhibits no edema.  Left CVA ttp  Neurological: She is alert and oriented to person, place, and time.  Skin: Skin is warm and dry.  Psychiatric: She has a normal mood and affect. Her behavior is normal.  Nursing note and vitals reviewed.   ED Course  Procedures (including  critical care time) Labs Review Labs Reviewed  CBC - Abnormal; Notable for the following:    WBC 12.3 (*)    All other components within normal limits  COMPREHENSIVE METABOLIC PANEL - Abnormal; Notable for the following:    Total Bilirubin 2.5 (*)    All other components within normal limits  URINALYSIS, ROUTINE W REFLEX MICROSCOPIC - Abnormal; Notable for the following:    APPearance CLOUDY (*)    Hgb urine dipstick LARGE (*)    Protein, ur 30 (*)    Leukocytes, UA LARGE (*)    All other components within normal limits  URINE MICROSCOPIC-ADD ON - Abnormal; Notable for the following:    Bacteria, UA FEW (*)    All other components within normal limits  PREGNANCY, URINE    Imaging Review No results found.   EKG Interpretation None      MDM   Final diagnoses:  Left flank pain    8:15  PM 24 y.o. female w hx of IBS, PID, bipolar  Who presents with left flank pain which began several hours ago. She notes it's consistent with previous kidney stones. Of note , she was recently seen at [facility] earlier today and had a noncontributory pelvic ultrasound and CT scan which showed a kidney stone in the left kidney but not in the ureter. She  Has had some vomiting and subjective fever. Her vital signs are unremarkable here. She appears well on exam and is in no acute distress. She's been seen here multiple times in the past for abdominal pain. We'll get screening labs and  A dose of pain medicine.  Nonobstructing left renal calculus and no hydronephrosis on CT at [facility]. Pt got azithro/rocephin to cover for STD at [facility]. D/c home on 5 tabs of hydrocodone and macrobid for UTI.   11:06 PM: Do not think repeat imaging necessary given the fact that pt just had thorough workup earlier today. It is suspicious that she would seek care at a different hospital after being seen at [facility] twice recently. She states this is just because she lives in [place]. She requested  another narcotic Rx but I refused as she was just given one earlier today.  I have discussed the diagnosis/risks/treatment options with the patient and believe the pt to be eligible for discharge home to follow-up with GI as recommended at [facility] and on previous visits here. We also discussed returning to the ED immediately if new or worsening sx occur. We discussed the sx which are most concerning (e.g., worsening pain, fever) that necessitate immediate return. Medications administered to the patient during their visit and any new prescriptions provided to the patient are listed below.  Medications given during this visit Medications  sodium chloride 0.9 % bolus 1,000 mL (0 mLs Intravenous Stopped 09/29/14 2205)  ondansetron (ZOFRAN) injection 4 mg (4 mg Intravenous Given 09/29/14 2000)  HYDROmorphone (DILAUDID) injection 1 mg (1 mg Intravenous Given 09/29/14 2000)  HYDROmorphone (DILAUDID) injection 1 mg (1 mg Intravenous Given 09/29/14 2209)    New Prescriptions   No medications on file       Pamella Pert, MD 09/30/14 1101

## 2014-10-01 ENCOUNTER — Emergency Department (HOSPITAL_COMMUNITY): Payer: Managed Care, Other (non HMO)

## 2014-10-01 ENCOUNTER — Encounter (HOSPITAL_COMMUNITY): Payer: Self-pay | Admitting: Emergency Medicine

## 2014-10-01 ENCOUNTER — Emergency Department (HOSPITAL_COMMUNITY)
Admission: EM | Admit: 2014-10-01 | Discharge: 2014-10-02 | Disposition: A | Discharge status: Home / Self Care | Payer: Self-pay | Attending: Emergency Medicine | Admitting: Emergency Medicine

## 2014-10-01 DIAGNOSIS — Z87442 Personal history of urinary calculi: Secondary | ICD-10-CM | POA: Insufficient documentation

## 2014-10-01 DIAGNOSIS — Z91040 Latex allergy status: Secondary | ICD-10-CM | POA: Insufficient documentation

## 2014-10-01 DIAGNOSIS — Z862 Personal history of diseases of the blood and blood-forming organs and certain disorders involving the immune mechanism: Secondary | ICD-10-CM | POA: Insufficient documentation

## 2014-10-01 DIAGNOSIS — Z8719 Personal history of other diseases of the digestive system: Secondary | ICD-10-CM | POA: Insufficient documentation

## 2014-10-01 DIAGNOSIS — Z8742 Personal history of other diseases of the female genital tract: Secondary | ICD-10-CM | POA: Insufficient documentation

## 2014-10-01 DIAGNOSIS — M1990 Unspecified osteoarthritis, unspecified site: Secondary | ICD-10-CM | POA: Insufficient documentation

## 2014-10-01 DIAGNOSIS — Z79899 Other long term (current) drug therapy: Secondary | ICD-10-CM | POA: Insufficient documentation

## 2014-10-01 DIAGNOSIS — Z720 Tobacco use: Secondary | ICD-10-CM | POA: Insufficient documentation

## 2014-10-01 DIAGNOSIS — R109 Unspecified abdominal pain: Secondary | ICD-10-CM | POA: Insufficient documentation

## 2014-10-01 DIAGNOSIS — Z8659 Personal history of other mental and behavioral disorders: Secondary | ICD-10-CM | POA: Insufficient documentation

## 2014-10-01 DIAGNOSIS — Z792 Long term (current) use of antibiotics: Secondary | ICD-10-CM | POA: Insufficient documentation

## 2014-10-01 DIAGNOSIS — Z8619 Personal history of other infectious and parasitic diseases: Secondary | ICD-10-CM | POA: Insufficient documentation

## 2014-10-01 LAB — URINALYSIS, ROUTINE W REFLEX MICROSCOPIC
Bilirubin Urine: NEGATIVE
Glucose, UA: NEGATIVE mg/dL
Ketones, ur: NEGATIVE mg/dL
Nitrite: NEGATIVE
PH: 7 (ref 5.0–8.0)
PROTEIN: NEGATIVE mg/dL
Specific Gravity, Urine: 1.015 (ref 1.005–1.030)
UROBILINOGEN UA: 0.2 mg/dL (ref 0.0–1.0)

## 2014-10-01 LAB — URINE MICROSCOPIC-ADD ON

## 2014-10-01 MED ORDER — FENTANYL CITRATE 0.05 MG/ML IJ SOLN: 50.0000 ug | Freq: Once | INTRAMUSCULAR | Status: DC

## 2014-10-01 MED ORDER — HYDROMORPHONE HCL 1 MG/ML IJ SOLN
1.0000 mg | Freq: Once | INTRAMUSCULAR | Status: AC
  Administered 2014-10-02: 1 mg via INTRAVENOUS
  Filled 2014-10-01: qty 1

## 2014-10-01 MED ORDER — ONDANSETRON HCL 4 MG/2ML IJ SOLN
4.0000 mg | Freq: Once | INTRAMUSCULAR | Status: AC
  Administered 2014-10-01: 4 mg via INTRAVENOUS
  Filled 2014-10-01: qty 2

## 2014-10-01 MED ORDER — FENTANYL CITRATE 0.05 MG/ML IJ SOLN
100.0000 ug | Freq: Once | INTRAMUSCULAR | Status: AC
Start: 2014-10-01 — End: 2014-10-01
  Administered 2014-10-01: 100 ug via INTRAVENOUS
  Filled 2014-10-01: qty 2

## 2014-10-01 NOTE — ED Notes (Signed)
Patient refused labs at this time. RN made aware.

## 2014-10-01 NOTE — ED Notes (Signed)
Pt states that she has been having lt flank pain w/ confirmed kidney stone on 1/12.  States that she is still having pain and vomiting.

## 2014-10-02 LAB — CBC WITH DIFFERENTIAL/PLATELET
Basophils Absolute: 0.1 10*3/uL (ref 0.0–0.1)
Basophils Relative: 1 % (ref 0–1)
EOS ABS: 0.1 10*3/uL (ref 0.0–0.7)
Eosinophils Relative: 2 % (ref 0–5)
HCT: 36.1 % (ref 36.0–46.0)
Hemoglobin: 12.3 g/dL (ref 12.0–15.0)
Lymphocytes Relative: 46 % (ref 12–46)
Lymphs Abs: 2.8 10*3/uL (ref 0.7–4.0)
MCH: 31.9 pg (ref 26.0–34.0)
MCHC: 34.1 g/dL (ref 30.0–36.0)
MCV: 93.8 fL (ref 78.0–100.0)
Monocytes Absolute: 0.4 10*3/uL (ref 0.1–1.0)
Monocytes Relative: 6 % (ref 3–12)
Neutro Abs: 2.8 10*3/uL (ref 1.7–7.7)
Neutrophils Relative %: 45 % (ref 43–77)
Platelets: 249 10*3/uL (ref 150–400)
RBC: 3.85 MIL/uL — ABNORMAL LOW (ref 3.87–5.11)
RDW: 11.9 % (ref 11.5–15.5)
WBC: 6.2 10*3/uL (ref 4.0–10.5)

## 2014-10-02 LAB — COMPREHENSIVE METABOLIC PANEL
ALT: 13 U/L (ref 0–35)
AST: 21 U/L (ref 0–37)
Albumin: 4.3 g/dL (ref 3.5–5.2)
Alkaline Phosphatase: 67 U/L (ref 39–117)
Anion gap: 10 (ref 5–15)
BUN: 12 mg/dL (ref 6–23)
CHLORIDE: 103 meq/L (ref 96–112)
CO2: 23 mmol/L (ref 19–32)
Calcium: 8.8 mg/dL (ref 8.4–10.5)
Creatinine, Ser: 0.58 mg/dL (ref 0.50–1.10)
Glucose, Bld: 64 mg/dL — ABNORMAL LOW (ref 70–99)
Potassium: 3.8 mmol/L (ref 3.5–5.1)
SODIUM: 136 mmol/L (ref 135–145)
TOTAL PROTEIN: 7.5 g/dL (ref 6.0–8.3)
Total Bilirubin: 1.6 mg/dL — ABNORMAL HIGH (ref 0.3–1.2)

## 2014-10-02 LAB — LIPASE, BLOOD: Lipase: 114 U/L — ABNORMAL HIGH (ref 11–59)

## 2014-10-02 MED ORDER — OXYCODONE-ACETAMINOPHEN 5-325 MG PO TABS
1.0000 | ORAL_TABLET | Freq: Four times a day (QID) | ORAL | Status: DC | PRN
Start: 2014-10-02 — End: 2016-09-15

## 2014-10-02 MED ORDER — HYDROMORPHONE HCL 1 MG/ML IJ SOLN
1.0000 mg | Freq: Once | INTRAMUSCULAR | Status: AC
  Administered 2014-10-02: 1 mg via INTRAVENOUS
  Filled 2014-10-02: qty 1

## 2014-10-02 MED ORDER — PROMETHAZINE HCL 25 MG PO TABS
25.0000 mg | ORAL_TABLET | Freq: Three times a day (TID) | ORAL | Status: DC | PRN
Start: 2014-10-02 — End: 2016-09-15

## 2014-10-02 NOTE — ED Provider Notes (Signed)
CSN: [id number]     Arrival date & time 10/01/14  1818 History   First MD Initiated Contact with Patient 10/01/14 2224     Chief Complaint  Patient presents with  . Flank Pain     (Consider location/radiation/quality/duration/timing/severity/associated sxs/prior Treatment) HPI Patient presents to the emergency department with continued left-sided flank pain.  The patient has been seen at multiple facilities for this left-sided flank pain.  The patient states that she thinks that she has kidney stones.  Patient states she was told to [facility] that this was the case.  The patient states that nothing seems make her condition better or worse.  The patient was seen in the emergency department 2 days prior to this visit.  Patient denies chest pain, shortness of breath, headache, blurred vision, weakness, dizziness, back pain, neck pain, fever, cough, runny nose, sore throat, abdominal pain, nausea, vomiting, diarrhea, or syncope.  The patient states that she is was given pain medicine at [facility] but not in her visit at the hospital 2 days ago Past Medical History  Diagnosis Date  . IBS (irritable bowel syndrome)   . Bipolar 1 disorder   . Mononucleosis   . Kidney stones   . PID (acute pelvic inflammatory disease) 04/28/2012  . Chlamydia 04/29/2012  . C. difficile diarrhea   . Anemia   . Anxiety   . Arthritis   . Depression   . Ulcer   . Abnormal Pap smear     colposcopy  . HPV in female   . Fibromyalgia   . Seizures     October 2014 was last one    Past Surgical History  Procedure Laterality Date  . Kidney stone surgery    . Tonsillectomy    . Inner ear surgery    . Wisdom tooth extraction    . Colposcopy     Family History  Problem Relation Age of Onset  . Bipolar disorder Mother   . Cancer Father   . ADD / ADHD Brother   . Cancer Maternal Grandmother   . Cancer Maternal Grandfather   . Cancer Paternal Grandmother   . Cancer Paternal Grandfather    History  Substance Use  Topics  . Smoking status: Current Every Day Smoker -- 0.50 packs/day for 5 years    Types: Cigarettes  . Smokeless tobacco: Never Used  . Alcohol Use: No   OB History    Gravida Para Term Preterm AB TAB SAB Ectopic Multiple Living   0              Review of Systems   All other systems negative except as documented in the HPI. All pertinent positives and negatives as reviewed in the HPI.  Allergies  Azithromycin; Prednisone; Bactrim; Doxycycline hyclate; Ibuprofen; Latex; Morphine and related; and Toradol  Home Medications   Prior to Admission medications   Medication Sig Start Date End Date Taking? Authorizing Provider  acetaminophen (TYLENOL) 500 MG tablet Take 1,000 mg by mouth every 6 (six) hours as needed for mild pain.   Yes Historical Provider, MD  [place] alk-PHENObarbital (DONNATAL) 16.2 MG tablet Take 1 tablet by mouth 2 (two) times daily. For 10 days.   Yes Historical Provider, MD  ibuprofen (ADVIL,MOTRIN) 200 MG tablet Take 200 mg by mouth every 6 (six) hours as needed for moderate pain.   Yes Historical Provider, MD  nitrofurantoin (MACRODANTIN) 100 MG capsule Take 100 mg by mouth 2 (two) times daily. 5 days   Yes Historical Provider,  MD  norgestimate-ethinyl estradiol (ORTHO-CYCLEN,SPRINTEC,PREVIFEM) 0.25-35 MG-MCG tablet Take 1 tablet by mouth daily.   Yes Historical Provider, MD  famotidine (PEPCID) 20 MG tablet Take 1 tablet (20 mg total) by mouth 2 (two) times daily. Patient not taking: Reported on 09/29/2014 07/26/14   Noland Fordyce, PA-C  HYDROcodone-acetaminophen (HYCET) 7.5-325 mg/15 ml solution Take 10 mLs by mouth every 6 (six) hours as needed for moderate pain or severe pain. Patient not taking: Reported on 09/29/2014 06/19/14   April K Palumbo-Rasch, MD  omeprazole (PRILOSEC) 20 MG capsule Take 1 capsule (20 mg total) by mouth daily. Patient not taking: Reported on 09/29/2014 06/19/14   April K Palumbo-Rasch, MD  ondansetron [facility] ODT) 4 MG disintegrating  tablet 84m ODT q4 hours prn nausea/vomit Patient not taking: Reported on 09/29/2014 05/28/14   DNorman Herrlich NP  ondansetron (ZOFRAN) 4 MG tablet Take 1 tablet (4 mg total) by mouth every 6 (six) hours. Patient not taking: Reported on 09/29/2014 07/26/14   ENoland Fordyce PA-C  oxyCODONE-acetaminophen (PERCOCET/ROXICET) 5-325 MG per tablet Take 1 tablet by mouth every 6 (six) hours as needed for severe pain. Patient not taking: Reported on 09/29/2014 05/28/14   DNorman Herrlich NP  sucralfate (CARAFATE) 1 GM/10ML suspension Take 10 mLs (1 g total) by mouth 4 (four) times daily -  with meals and at bedtime. Patient not taking: Reported on 09/29/2014 06/19/14   April K Palumbo-Rasch, MD   BP 122/59 mmHg  Pulse 66  Temp(Src) 98.2 F (36.8 C) (Oral)  Resp 16  SpO2 99%  LMP 09/15/2014 (Exact Date) Physical Exam  Constitutional: She is oriented to person, place, and time. She appears well-developed and well-nourished. No distress.  HENT:  Head: Normocephalic and atraumatic.  Mouth/Throat: Oropharynx is clear and moist.  Eyes: Pupils are equal, round, and reactive to light.  Neck: Normal range of motion. Neck supple.  Cardiovascular: Normal rate, regular rhythm and normal heart sounds.  Exam reveals no gallop and no friction rub.   No murmur heard. Pulmonary/Chest: Effort normal and breath sounds normal. No respiratory distress.  Abdominal: Soft. Bowel sounds are normal. She exhibits no distension. There is no tenderness. There is no rebound and no guarding.  Musculoskeletal: She exhibits no edema.  Neurological: She is alert and oriented to person, place, and time. She exhibits normal muscle tone. Coordination normal.  Skin: Skin is warm and dry. No rash noted. No erythema.  Nursing note and vitals reviewed.   ED Course  Procedures (including critical care time) Labs Review Labs Reviewed  URINALYSIS, ROUTINE W REFLEX MICROSCOPIC - Abnormal; Notable for the following:    APPearance CLOUDY  (*)    Hgb urine dipstick LARGE (*)    Leukocytes, UA TRACE (*)    All other components within normal limits  URINE MICROSCOPIC-ADD ON  CBC WITH DIFFERENTIAL  COMPREHENSIVE METABOLIC PANEL  LIPASE, BLOOD    Imaging Review Ct Renal Stone Study  10/02/2014   CLINICAL DATA:  Acute onset of left flank pain and vomiting. Initial encounter.  EXAM: CT ABDOMEN AND PELVIS WITHOUT CONTRAST  TECHNIQUE: Multidetector CT imaging of the abdomen and pelvis was performed following the standard protocol without IV contrast.  COMPARISON:  CT of the abdomen and pelvis from 09/26/2014  FINDINGS: The visualized lung bases are clear.  The liver and spleen are unremarkable in appearance. The patient is status post cholecystectomy, with clips noted at the gallbladder fossa. The pancreas and adrenal glands are unremarkable.  A 3 mm nonobstructing stone  is noted at the lower pole of the left kidney. The kidneys are otherwise unremarkable in appearance. There is no evidence of hydronephrosis. No obstructing ureteral stones are seen. No perinephric stranding is appreciated.  No free fluid is identified. The small bowel is unremarkable in appearance. The stomach is within normal limits. No acute vascular abnormalities are seen.  The appendix is not well characterized; there is no evidence of appendicitis. The colon is partially filled with stool. The transverse colon is mildly redundant. The colon is unremarkable in appearance.  The bladder is decompressed and not well assessed. The uterus is unremarkable in appearance. The ovaries are relatively symmetric. No suspicious adnexal masses are seen. No inguinal lymphadenopathy is seen.  No acute osseous abnormalities are identified.  IMPRESSION: 1. No acute abnormality seen in the abdomen or pelvis. No evidence of hydronephrosis. 2. Nonobstructing 3 mm stone at the lower pole of the left kidney.   Electronically Signed   By: Garald Balding M.D.   On: 10/02/2014 00:56    Advised the  patient that she will need to follow-up with her primary care doctor along with the urologist advised her that the emergency department is not contributing to further manage this effectively for her.  Told to return here as needed for any worsening in her condition   MDM   Final diagnoses:  Flank pain        Brent General, PA-C 10/03/14 [place], MD 10/04/14 1404

## 2014-10-02 NOTE — Discharge Instructions (Signed)
Return here as needed.  Follow-up with the urologist provided and your primary care doctor

## 2014-10-02 NOTE — ED Notes (Signed)
Patient resting on bed, texting on phone, laughing with female companion at bedside, in NAD, rates pain 8/10.

## 2014-10-12 ENCOUNTER — Emergency Department (HOSPITAL_COMMUNITY)
Admission: EM | Admit: 2014-10-12 | Discharge: 2014-10-13 | Disposition: A | Discharge status: Home / Self Care | Payer: Managed Care, Other (non HMO) | Attending: Emergency Medicine | Admitting: Emergency Medicine

## 2014-10-12 ENCOUNTER — Encounter (HOSPITAL_COMMUNITY): Payer: Self-pay

## 2014-10-12 DIAGNOSIS — Z8719 Personal history of other diseases of the digestive system: Secondary | ICD-10-CM | POA: Insufficient documentation

## 2014-10-12 DIAGNOSIS — M1990 Unspecified osteoarthritis, unspecified site: Secondary | ICD-10-CM | POA: Insufficient documentation

## 2014-10-12 DIAGNOSIS — R1031 Right lower quadrant pain: Secondary | ICD-10-CM | POA: Insufficient documentation

## 2014-10-12 DIAGNOSIS — Z3202 Encounter for pregnancy test, result negative: Secondary | ICD-10-CM | POA: Insufficient documentation

## 2014-10-12 DIAGNOSIS — Z720 Tobacco use: Secondary | ICD-10-CM | POA: Insufficient documentation

## 2014-10-12 DIAGNOSIS — Z87442 Personal history of urinary calculi: Secondary | ICD-10-CM | POA: Insufficient documentation

## 2014-10-12 DIAGNOSIS — Z8619 Personal history of other infectious and parasitic diseases: Secondary | ICD-10-CM | POA: Insufficient documentation

## 2014-10-12 DIAGNOSIS — Z862 Personal history of diseases of the blood and blood-forming organs and certain disorders involving the immune mechanism: Secondary | ICD-10-CM | POA: Insufficient documentation

## 2014-10-12 DIAGNOSIS — R109 Unspecified abdominal pain: Secondary | ICD-10-CM

## 2014-10-12 DIAGNOSIS — Z79899 Other long term (current) drug therapy: Secondary | ICD-10-CM | POA: Insufficient documentation

## 2014-10-12 DIAGNOSIS — G8929 Other chronic pain: Secondary | ICD-10-CM | POA: Insufficient documentation

## 2014-10-12 DIAGNOSIS — Z8759 Personal history of other complications of pregnancy, childbirth and the puerperium: Secondary | ICD-10-CM | POA: Insufficient documentation

## 2014-10-12 DIAGNOSIS — Z91040 Latex allergy status: Secondary | ICD-10-CM | POA: Insufficient documentation

## 2014-10-12 DIAGNOSIS — N898 Other specified noninflammatory disorders of vagina: Secondary | ICD-10-CM | POA: Insufficient documentation

## 2014-10-12 NOTE — ED Notes (Signed)
Pt states that she has a hx of PID, two nights ago she developed right sided pelvic pain, no bleeding but having some discharge

## 2014-10-13 LAB — COMPREHENSIVE METABOLIC PANEL
ALK PHOS: 62 U/L (ref 39–117)
ALT: 11 U/L (ref 0–35)
AST: 16 U/L (ref 0–37)
Albumin: 4.7 g/dL (ref 3.5–5.2)
Anion gap: 9 (ref 5–15)
BUN: 13 mg/dL (ref 6–23)
CALCIUM: 9 mg/dL (ref 8.4–10.5)
CO2: 25 mmol/L (ref 19–32)
CREATININE: 0.57 mg/dL (ref 0.50–1.10)
Chloride: 104 mmol/L (ref 96–112)
GFR calc Af Amer: >90 mL/min (ref >90)
GFR calc non Af Amer: >90 mL/min (ref >90)
Glucose, Bld: 82 mg/dL (ref 70–99)
Potassium: 3.8 mmol/L (ref 3.5–5.1)
SODIUM: 138 mmol/L (ref 135–145)
Total Bilirubin: 0.8 mg/dL (ref 0.3–1.2)
Total Protein: 8 g/dL (ref 6.0–8.3)

## 2014-10-13 LAB — URINALYSIS, ROUTINE W REFLEX MICROSCOPIC
BILIRUBIN URINE: NEGATIVE
Glucose, UA: NEGATIVE mg/dL
KETONES UR: NEGATIVE mg/dL
Nitrite: NEGATIVE
Protein, ur: NEGATIVE mg/dL
SPECIFIC GRAVITY, URINE: 1.016 (ref 1.005–1.030)
Urobilinogen, UA: 0.2 mg/dL (ref 0.0–1.0)
pH: 7.5 (ref 5.0–8.0)

## 2014-10-13 LAB — CBC WITH DIFFERENTIAL/PLATELET
Basophils Absolute: 0.1 10*3/uL (ref 0.0–0.1)
Basophils Relative: 1 % (ref 0–1)
EOS ABS: 0.2 10*3/uL (ref 0.0–0.7)
EOS PCT: 3 % (ref 0–5)
HEMATOCRIT: 37.3 % (ref 36.0–46.0)
Hemoglobin: 12.8 g/dL (ref 12.0–15.0)
LYMPHS ABS: 2.7 10*3/uL (ref 0.7–4.0)
Lymphocytes Relative: 38 % (ref 12–46)
MCH: 31.9 pg (ref 26.0–34.0)
MCHC: 34.3 g/dL (ref 30.0–36.0)
MCV: 93 fL (ref 78.0–100.0)
Monocytes Absolute: 0.4 10*3/uL (ref 0.1–1.0)
Monocytes Relative: 6 % (ref 3–12)
NEUTROS ABS: 3.7 10*3/uL (ref 1.7–7.7)
NEUTROS PCT: 52 % (ref 43–77)
Platelets: 262 10*3/uL (ref 150–400)
RBC: 4.01 MIL/uL (ref 3.87–5.11)
RDW: 12.1 % (ref 11.5–15.5)
WBC: 7 10*3/uL (ref 4.0–10.5)

## 2014-10-13 LAB — RAPID URINE DRUG SCREEN, HOSP PERFORMED
Amphetamines: NOT DETECTED
BENZODIAZEPINES: NOT DETECTED
Barbiturates: POSITIVE — AB
COCAINE: NOT DETECTED
OPIATES: NOT DETECTED
Tetrahydrocannabinol: NOT DETECTED

## 2014-10-13 LAB — GC/CHLAMYDIA PROBE AMP (~~LOC~~) NOT AT ARMC
CHLAMYDIA, DNA PROBE: NEGATIVE
Neisseria Gonorrhea: NEGATIVE

## 2014-10-13 LAB — URINE MICROSCOPIC-ADD ON

## 2014-10-13 LAB — WET PREP, GENITAL
Clue Cells Wet Prep HPF POC: NONE SEEN
Trich, Wet Prep: NONE SEEN
WBC, Wet Prep HPF POC: NONE SEEN
Yeast Wet Prep HPF POC: NONE SEEN

## 2014-10-13 LAB — POC URINE PREG, ED: Preg Test, Ur: NEGATIVE

## 2014-10-13 MED ORDER — ONDANSETRON HCL 4 MG PO TABS: 4.0000 mg | ORAL_TABLET | Freq: Four times a day (QID) | ORAL | Status: DC

## 2014-10-13 MED ORDER — ONDANSETRON HCL 4 MG/2ML IJ SOLN
4.0000 mg | Freq: Once | INTRAMUSCULAR | Status: AC
  Administered 2014-10-13: 4 mg via INTRAVENOUS
  Filled 2014-10-13: qty 2

## 2014-10-13 MED ORDER — DICYCLOMINE HCL 10 MG/ML IM SOLN
20.0000 mg | Freq: Once | INTRAMUSCULAR | Status: DC
  Filled 2014-10-13: qty 2

## 2014-10-13 MED ORDER — DICYCLOMINE HCL 20 MG PO TABS: 20.0000 mg | ORAL_TABLET | Freq: Two times a day (BID) | ORAL | Status: DC

## 2014-10-13 MED ORDER — SODIUM CHLORIDE 0.9 % IV BOLUS (SEPSIS)
1000.0000 mL | Freq: Once | INTRAVENOUS | Status: AC
  Administered 2014-10-13: 1000 mL via INTRAVENOUS

## 2014-10-13 MED ORDER — OXYCODONE-ACETAMINOPHEN 5-325 MG PO TABS
2.0000 | ORAL_TABLET | Freq: Once | ORAL | Status: AC
  Administered 2014-10-13: 2 via ORAL
  Filled 2014-10-13: qty 2

## 2014-10-13 NOTE — Discharge Instructions (Signed)
°Abdominal Pain, Women °Abdominal (stomach, pelvic, or belly) pain can be caused by many things. It is important to tell your doctor: °· The location of the pain. °· Does it come and go or is it present all the time? °· Are there things that start the pain (eating certain foods, exercise)? °· Are there other symptoms associated with the pain (fever, nausea, vomiting, diarrhea)? °All of this is helpful to know when trying to find the cause of the pain. °CAUSES  °· Stomach: virus or bacteria infection, or ulcer. °· Intestine: appendicitis (inflamed appendix), regional ileitis (Crohn's disease), ulcerative colitis (inflamed colon), irritable bowel syndrome, diverticulitis (inflamed diverticulum of the colon), or cancer of the stomach or intestine. °· Gallbladder disease or stones in the gallbladder. °· Kidney disease, kidney stones, or infection. °· Pancreas infection or cancer. °· Fibromyalgia (pain disorder). °· Diseases of the female organs: °¨ Uterus: fibroid (non-cancerous) tumors or infection. °¨ Fallopian tubes: infection or tubal pregnancy. °¨ Ovary: cysts or tumors. °¨ Pelvic adhesions (scar tissue). °¨ Endometriosis (uterus lining tissue growing in the pelvis and on the pelvic organs). °¨ Pelvic congestion syndrome (female organs filling up with blood just before the menstrual period). °¨ Pain with the menstrual period. °¨ Pain with ovulation (producing an egg). °¨ Pain with an IUD (intrauterine device, birth control) in the uterus. °¨ Cancer of the female organs. °· Functional pain (pain not caused by a disease, may improve without treatment). °· Psychological pain. °· Depression. °DIAGNOSIS  °Your doctor will decide the seriousness of your pain by doing an examination. °· Blood tests. °· X-rays. °· Ultrasound. °· CT scan (computed tomography, special type of X-ray). °· MRI (magnetic resonance imaging). °· Cultures, for infection. °· Barium enema (dye inserted in the large intestine, to better view it with  X-rays). °· Colonoscopy (looking in intestine with a lighted tube). °· Laparoscopy (minor surgery, looking in abdomen with a lighted tube). °· Major abdominal exploratory surgery (looking in abdomen with a large incision). °TREATMENT  °The treatment will depend on the cause of the pain.  °· Many cases can be observed and treated at home. °· Over-the-counter medicines recommended by your caregiver. °· Prescription medicine. °· Antibiotics, for infection. °· Birth control pills, for painful periods or for ovulation pain. °· Hormone treatment, for endometriosis. °· Nerve blocking injections. °· Physical therapy. °· Antidepressants. °· Counseling with a psychologist or psychiatrist. °· Minor or major surgery. °HOME CARE INSTRUCTIONS  °· Do not take laxatives, unless directed by your caregiver. °· Take over-the-counter pain medicine only if ordered by your caregiver. Do not take aspirin because it can cause an upset stomach or bleeding. °· Try a clear liquid diet (broth or water) as ordered by your caregiver. Slowly move to a bland diet, as tolerated, if the pain is related to the stomach or intestine. °· Have a thermometer and take your temperature several times a day, and record it. °· Bed rest and sleep, if it helps the pain. °· Avoid sexual intercourse, if it causes pain. °· Avoid stressful situations. °· Keep your follow-up appointments and tests, as your caregiver orders. °· If the pain does not go away with medicine or surgery, you may try: °¨ Acupuncture. °¨ Relaxation exercises (yoga, meditation). °¨ Group therapy. °¨ Counseling. °SEEK MEDICAL CARE IF:  °· You notice certain foods cause stomach pain. °· Your home care treatment is not helping your pain. °· You need stronger pain medicine. °· You want your IUD removed. °· You feel faint or   lightheaded. °· You develop nausea and vomiting. °· You develop a rash. °· You are having side effects or an allergy to your medicine. °SEEK IMMEDIATE MEDICAL CARE IF:  °· Your  pain does not go away or gets worse. °· You have a fever. °· Your pain is felt only in portions of the abdomen. The right side could possibly be appendicitis. The left lower portion of the abdomen could be colitis or diverticulitis. °· You are passing blood in your stools (bright red or black tarry stools, with or without vomiting). °· You have blood in your urine. °· You develop chills, with or without a fever. °· You pass out. °MAKE SURE YOU:  °· Understand these instructions. °· Will watch your condition. °· Will get help right away if you are not doing well or get worse. °Document Released: 07/02/2007 Document Revised: 01/19/2014 Document Reviewed: 07/22/2009 °[employer]® Patient Information ©2015 [employer]. This information is not intended to replace advice given to you by your health care provider. Make sure you discuss any questions you have with your health care provider. °Chronic Pain °Chronic pain can be defined as pain that is off and on and lasts for 3-6 months or longer. Many things cause chronic pain, which can make it difficult to make a diagnosis. There are many treatment options available for chronic pain. However, finding a treatment that works well for you may require trying various approaches until the right one is found. Many people benefit from a combination of two or more types of treatment to control their pain. °SYMPTOMS  °Chronic pain can occur anywhere in the body and can range from mild to very severe. Some types of chronic pain include: °· Headache. °· Low back pain. °· Cancer pain. °· Arthritis pain. °· Neurogenic pain. This is pain resulting from damage to nerves. ° People with chronic pain may also have other symptoms such as: °· Depression. °· Anger. °· Insomnia. °· Anxiety. °DIAGNOSIS  °Your health care provider will help diagnose your condition over time. In many cases, the initial focus will be on excluding possible conditions that could be causing the pain. Depending on your  symptoms, your health care provider may order tests to diagnose your condition. Some of these tests may include:  °· Blood tests.   °· CT scan.   °· MRI.   °· X-rays.   °· Ultrasounds.   °· Nerve conduction studies.   °You may need to see a specialist.  °TREATMENT  °Finding treatment that works well may take time. You may be referred to a pain specialist. He or she may prescribe medicine or therapies, such as:  °· Mindful meditation or yoga. °· Shots (injections) of numbing or pain-relieving medicines into the spine or area of pain. °· Local electrical stimulation. °· Acupuncture.   °· Massage therapy.   °· Aroma, color, light, or sound therapy.   °· Biofeedback.   °· Working with a physical therapist to keep from getting stiff.   °· Regular, gentle exercise.   °· Cognitive or behavioral therapy.   °· Group support.   °Sometimes, surgery may be recommended.  °HOME CARE INSTRUCTIONS  °· Take all medicines as directed by your health care provider.   °· Lessen stress in your life by relaxing and doing things such as listening to calming music.   °· Exercise or be active as directed by your health care provider.   °· Eat a healthy diet and include things such as vegetables, fruits, fish, and lean meats in your diet.   °· Keep all follow-up appointments with your health care provider.   °·   Attend a support group with others suffering from chronic pain. °SEEK MEDICAL CARE IF:  °· Your pain gets worse.   °· You develop a new pain that was not there before.   °· You cannot tolerate medicines given to you by your health care provider.   °· You have new symptoms since your last visit with your health care provider.   °SEEK IMMEDIATE MEDICAL CARE IF:  °· You feel weak.   °· You have decreased sensation or numbness.   °· You lose control of bowel or bladder function.   °· Your pain suddenly gets much worse.   °· You develop shaking. °· You develop chills. °· You develop confusion. °· You develop chest pain. °· You develop  shortness of breath.   °MAKE SURE YOU: °· Understand these instructions. °· Will watch your condition. °· Will get help right away if you are not doing well or get worse. °Document Released: 05/27/2002 Document Revised: 05/07/2013 Document Reviewed: 02/28/2013 °[employer]® Patient Information ©2015 [employer]. This information is not intended to replace advice given to you by your health care provider. Make sure you discuss any questions you have with your health care provider. ° °

## 2014-10-13 NOTE — ED Provider Notes (Signed)
CSN: [id number]     Arrival date & time 10/12/14  2330 History   First MD Initiated Contact with Patient 10/13/14 0423     Chief Complaint  Patient presents with  . Pelvic Pain    (Consider location/radiation/quality/duration/timing/severity/associated sxs/prior Treatment) HPI Comments: 24 year old female with a history of IBS, bipolar 1 disorder, PID, and chronic abdominal pain presents to the emergency department today for further evaluation of RLQ abdominal pain. Patient states "it feels like I'm being kicked in the side by a soccer cleat and someone is pulling my insides out from my vagina". Patient states that she has been experiencing worsening symptoms over the past 2 days. She has also noticed an increase in her vaginal discharge as well as associated dysuria and urinary frequency. Patient states that she has had nausea associated with her symptoms as well, but she denies vomiting. She reports a fever of 101.62F yesterday morning for which she took ibuprofen. She has also been trying Pepto-Bismol for management of her symptoms without relief. Pain is worse with palpation to the area. She denies associated melena, hematochezia, hematuria, or vaginal itching. Abdominal surgical hx significant for cholecystectomy. She reports engaging in unprotected sexual intercourse 2 months ago. She has been seen at [facility] and [facility] facilities 7 times in the last 3 weeks alone for c/o abdominal pain.  Patient is a 24 y.o. female presenting with pelvic pain. The history is provided by the patient. No language interpreter was used.  Pelvic Pain Associated symptoms include abdominal pain, a fever and nausea. Pertinent negatives include no vomiting.    Past Medical History  Diagnosis Date  . IBS (irritable bowel syndrome)   . Bipolar 1 disorder   . Mononucleosis   . Kidney stones   . PID (acute pelvic inflammatory disease) 04/28/2012  . Chlamydia 04/29/2012  . C. difficile diarrhea   . Anemia   . Anxiety    . Arthritis   . Depression   . Ulcer   . Abnormal Pap smear     colposcopy  . HPV in female   . Fibromyalgia   . Seizures     October 2014 was last one    Past Surgical History  Procedure Laterality Date  . Kidney stone surgery    . Tonsillectomy    . Inner ear surgery    . Wisdom tooth extraction    . Colposcopy     Family History  Problem Relation Age of Onset  . Bipolar disorder Mother   . Cancer Father   . ADD / ADHD Brother   . Cancer Maternal Grandmother   . Cancer Maternal Grandfather   . Cancer Paternal Grandmother   . Cancer Paternal Grandfather    History  Substance Use Topics  . Smoking status: Current Every Day Smoker -- 0.50 packs/day for 5 years    Types: Cigarettes  . Smokeless tobacco: Never Used  . Alcohol Use: No   OB History    Gravida Para Term Preterm AB TAB SAB Ectopic Multiple Living   0               Review of Systems  Constitutional: Positive for fever.  Gastrointestinal: Positive for nausea and abdominal pain. Negative for vomiting.  Genitourinary: Positive for dysuria, vaginal discharge and pelvic pain. Negative for hematuria.  Neurological: Negative for syncope.  All other systems reviewed and are negative.   Allergies  Azithromycin; Prednisone; Bactrim; Doxycycline hyclate; Flagyl; Latex; Morphine and related; Naproxen; and Toradol  Home Medications  Prior to Admission medications   Medication Sig Start Date End Date Taking? Authorizing Provider  acetaminophen (TYLENOL) 500 MG tablet Take 1,000 mg by mouth every 6 (six) hours as needed for mild pain.   Yes Historical Provider, MD  bismuth subsalicylate (PEPTO BISMOL) 262 MG/15ML suspension Take 30 mLs by mouth every 6 (six) hours as needed (for upset stomach).   Yes Historical Provider, MD  ibuprofen (ADVIL,MOTRIN) 200 MG tablet Take 200 mg by mouth every 6 (six) hours as needed for moderate pain.   Yes Historical Provider, MD  norgestimate-ethinyl estradiol  (ORTHO-CYCLEN,SPRINTEC,PREVIFEM) 0.25-35 MG-MCG tablet Take 1 tablet by mouth daily.   Yes Historical Provider, MD  dicyclomine (BENTYL) 20 MG tablet Take 1 tablet (20 mg total) by mouth 2 (two) times daily. 10/13/14   Antony Madura, PA-C  famotidine (PEPCID) 20 MG tablet Take 1 tablet (20 mg total) by mouth 2 (two) times daily. Patient not taking: Reported on 09/29/2014 07/26/14   Junius Finner, PA-C  HYDROcodone-acetaminophen (HYCET) 7.5-325 mg/15 ml solution Take 10 mLs by mouth every 6 (six) hours as needed for moderate pain or severe pain. Patient not taking: Reported on 09/29/2014 06/19/14   April K Palumbo-Rasch, MD  omeprazole (PRILOSEC) 20 MG capsule Take 1 capsule (20 mg total) by mouth daily. Patient not taking: Reported on 09/29/2014 06/19/14   April K Palumbo-Rasch, MD  ondansetron (ZOFRAN) 4 MG tablet Take 1 tablet (4 mg total) by mouth every 6 (six) hours. 10/13/14   Antony Madura, PA-C  oxyCODONE-acetaminophen (PERCOCET/ROXICET) 5-325 MG per tablet Take 1 tablet by mouth every 6 (six) hours as needed for severe pain. Patient not taking: Reported on 10/12/2014 10/02/14   Jamesetta Orleans Lawyer, PA-C  promethazine (PHENERGAN) 25 MG tablet Take 1 tablet (25 mg total) by mouth every 8 (eight) hours as needed for nausea or vomiting. Patient not taking: Reported on 10/12/2014 10/02/14   Jamesetta Orleans Lawyer, PA-C  sucralfate (CARAFATE) 1 GM/10ML suspension Take 10 mLs (1 g total) by mouth 4 (four) times daily -  with meals and at bedtime. Patient not taking: Reported on 09/29/2014 06/19/14   April K Palumbo-Rasch, MD   BP 101/56 mmHg  Pulse 81  Temp(Src) 98.2 F (36.8 C) (Oral)  Resp 18  SpO2 98%  LMP 09/15/2014 (Exact Date)   Physical Exam  Constitutional: She is oriented to person, place, and time. She appears well-developed and well-nourished. No distress.  Nontoxic/nonseptic appearing  HENT:  Head: Normocephalic and atraumatic.  Eyes: Conjunctivae and EOM are normal. No scleral icterus.   Neck: Normal range of motion.  Cardiovascular: Normal rate, regular rhythm and normal heart sounds.   Pulmonary/Chest: Effort normal and breath sounds normal. No respiratory distress. She has no wheezes. She has no rales.  Respirations even and unlabored  Abdominal: Soft. She exhibits no distension. There is tenderness. There is no rebound and no guarding.  Mild TTP with deep palpation of the RUQ. TTP with light and deep palpation to the RLQ without rebound; positive voluntary guarding. No involuntary guarding or peritoneal signs.  Musculoskeletal: Normal range of motion.  Neurological: She is alert and oriented to person, place, and time. She exhibits normal muscle tone. Coordination normal.  GCS 15. Patient moves extremities without ataxia.  Skin: Skin is warm and dry. No rash noted. She is not diaphoretic. No erythema. No pallor.  Psychiatric: She has a normal mood and affect. Her behavior is normal.  Nursing note and vitals reviewed.   ED Course  Procedures (including critical  care time) Labs Review Labs Reviewed  URINALYSIS, ROUTINE W REFLEX MICROSCOPIC - Abnormal; Notable for the following:    APPearance TURBID (*)    Hgb urine dipstick MODERATE (*)    Leukocytes, UA TRACE (*)    All other components within normal limits  URINE MICROSCOPIC-ADD ON - Abnormal; Notable for the following:    Squamous Epithelial / LPF FEW (*)    Bacteria, UA FEW (*)    All other components within normal limits  URINE RAPID DRUG SCREEN (HOSP PERFORMED) - Abnormal; Notable for the following:    Barbiturates POSITIVE (*)    All other components within normal limits  WET PREP, GENITAL  CBC WITH DIFFERENTIAL/PLATELET  COMPREHENSIVE METABOLIC PANEL  POC URINE PREG, ED  GC/CHLAMYDIA PROBE AMP ([place])  GC/CHLAMYDIA PROBE AMP ([place])    Imaging Review Ct Renal Stone Study  10/02/2014   CLINICAL DATA:  Acute onset of left flank pain and vomiting. Initial encounter.  EXAM: CT ABDOMEN AND  PELVIS WITHOUT CONTRAST  TECHNIQUE: Multidetector CT imaging of the abdomen and pelvis was performed following the standard protocol without IV contrast.  COMPARISON:  CT of the abdomen and pelvis from 09/26/2014  FINDINGS: The visualized lung bases are clear.  The liver and spleen are unremarkable in appearance. The patient is status post cholecystectomy, with clips noted at the gallbladder fossa. The pancreas and adrenal glands are unremarkable.  A 3 mm nonobstructing stone is noted at the lower pole of the left kidney. The kidneys are otherwise unremarkable in appearance. There is no evidence of hydronephrosis. No obstructing ureteral stones are seen. No perinephric stranding is appreciated.  No free fluid is identified. The small bowel is unremarkable in appearance. The stomach is within normal limits. No acute vascular abnormalities are seen.  The appendix is not well characterized; there is no evidence of appendicitis. The colon is partially filled with stool. The transverse colon is mildly redundant. The colon is unremarkable in appearance.  The bladder is decompressed and not well assessed. The uterus is unremarkable in appearance. The ovaries are relatively symmetric. No suspicious adnexal masses are seen. No inguinal lymphadenopathy is seen.  No acute osseous abnormalities are identified.  IMPRESSION: 1. No acute abnormality seen in the abdomen or pelvis. No evidence of hydronephrosis. 2. Nonobstructing 3 mm stone at the lower pole of the left kidney.   Electronically Signed   By: Roanna RaiderJeffery  Chang M.D.   On: 10/02/2014 00:56     EKG Interpretation None      MDM   Final diagnoses:  Right lower quadrant abdominal pain  Chronic abdominal pain    Patient is a 24 y/o female who presents to the ED for RLQ abdominal pain and vaginal discharge. Patient has focal tenderness in her right lower abdomen without voluntary guarding or peritoneal signs. Her abdomen is soft without masses. While patient  endorses a fever prior to arrival, she has been given no antipyretics over her ED course and has been afebrile for 6+ hours. No leukocytosis or left shift to suspect infectious etiology. Urinalysis does not suggest infection and wet prep does not suggest yeast or bacterial vaginosis. No Trichomonas or white blood cells seen on wet prep. Labs are consistent with baseline from 10 days ago.  Patient initially ordered to be given Bentyl and Zofran with fluids. Patient declined Bentyl and requested to speak again with this Clinical research associatewriter. I went to see and evaluate the patient and she exclaimed that Bentyl only works for [employer]  pains and her stomach and upper abdomen are not hurting her. I also explained the results of her workup today in their entirety as well as my low suspicion for any emergent abdominal pathology. Doubt appendicitis given chronicity of symptoms, lack of vomiting, and lack of leukocytosis and/or left shift. Doubt retained biliary stone given normal LFTs. No suspicion for SBO or pSBO given soft abdomen, lack of emesis, and normal BMs. No concern for TOA or ovarian torsion. No ovarian cysts seen on most recent imaging; doubt hemorrhagic cyst as cause of symptoms.  Patient expresses frustration over not receiving imaging in the ED today for further evaluation of her pain. Patient has had 4 abdominopelvic CTs in the last 6 weeks. She has also had numerous pelvic ultrasounds. I do not clinically believe a CT scan is indicated in this patient's care at this time. I strongly believe that this would only further expose her to the risks of radiation. I have stressed the need to follow up with a PCP and her GI doctor for further evaluation of her symptoms. Patient next, appearing frustrated, shifts to her untreated pain. She has been offered 2 tablets of percocet in the ED. Per [place] Substance Database, patient has been given 5 narcotic prescriptions in the last month. Given her repeat ED visits resulting in  discharge with opioids, it appears as though the patient would best be suited by, either, a pain management referral by her PCP or having her symptoms more closely followed by her PCP and/or GI physician. I do not feel that narcotics are indicated at discharge today and have explained to the patient that the ED is not the appropriate venue for the management of chronic pain. Will d/c with Bentyl and Zofran for symptomatic management PRN. Return precautions discussed and provided. Patient discharged from the ED in good condition; VSS.   Filed Vitals:   10/12/14 2350 10/13/14 0536 10/13/14 0636  BP: 109/65 98/65 101/56  Pulse: 100 85 81  Temp: 98.2 F (36.8 C)  98.2 F (36.8 C)  TempSrc: Oral    Resp: SpO2: 100% 100% 98%      Antony Madura, PA-C 10/13/14 0805  Derwood Kaplan, MD 10/14/14 2205

## 2014-12-14 ENCOUNTER — Encounter (HOSPITAL_COMMUNITY): Payer: Self-pay | Admitting: *Deleted

## 2014-12-14 ENCOUNTER — Inpatient Hospital Stay (HOSPITAL_COMMUNITY): Payer: Managed Care, Other (non HMO)

## 2014-12-14 ENCOUNTER — Inpatient Hospital Stay (HOSPITAL_COMMUNITY)
Admission: AD | Admit: 2014-12-14 | Discharge: 2014-12-14 | Disposition: A | Discharge status: Home / Self Care | Payer: Managed Care, Other (non HMO) | Source: Ambulatory Visit | Attending: Obstetrics and Gynecology | Admitting: Obstetrics and Gynecology

## 2014-12-14 DIAGNOSIS — F1110 Opioid abuse, uncomplicated: Secondary | ICD-10-CM

## 2014-12-14 DIAGNOSIS — K589 Irritable bowel syndrome without diarrhea: Secondary | ICD-10-CM | POA: Insufficient documentation

## 2014-12-14 DIAGNOSIS — Z91040 Latex allergy status: Secondary | ICD-10-CM | POA: Diagnosis not present

## 2014-12-14 DIAGNOSIS — Z79891 Long term (current) use of opiate analgesic: Secondary | ICD-10-CM | POA: Insufficient documentation

## 2014-12-14 DIAGNOSIS — M797 Fibromyalgia: Secondary | ICD-10-CM | POA: Diagnosis not present

## 2014-12-14 DIAGNOSIS — F17210 Nicotine dependence, cigarettes, uncomplicated: Secondary | ICD-10-CM | POA: Diagnosis not present

## 2014-12-14 DIAGNOSIS — Z87442 Personal history of urinary calculi: Secondary | ICD-10-CM | POA: Insufficient documentation

## 2014-12-14 DIAGNOSIS — G8929 Other chronic pain: Secondary | ICD-10-CM | POA: Diagnosis not present

## 2014-12-14 DIAGNOSIS — R102 Pelvic and perineal pain: Secondary | ICD-10-CM | POA: Diagnosis not present

## 2014-12-14 DIAGNOSIS — Z882 Allergy status to sulfonamides status: Secondary | ICD-10-CM | POA: Insufficient documentation

## 2014-12-14 HISTORY — DX: Pelvic and perineal pain: R10.2

## 2014-12-14 LAB — RAPID URINE DRUG SCREEN, HOSP PERFORMED
Amphetamines: NOT DETECTED
BENZODIAZEPINES: NOT DETECTED
Barbiturates: NOT DETECTED
Cocaine: NOT DETECTED
OPIATES: POSITIVE — AB
Tetrahydrocannabinol: NOT DETECTED

## 2014-12-14 LAB — POCT PREGNANCY, URINE: PREG TEST UR: NEGATIVE

## 2014-12-14 MED ORDER — ONDANSETRON 8 MG PO TBDP
8.0000 mg | ORAL_TABLET | Freq: Once | ORAL | Status: DC
  Filled 2014-12-14: qty 1

## 2014-12-14 NOTE — MAU Note (Signed)
Patient refuses oral zofran. She states that she will take phenergan by IV. She says she would rather take nothing than to take medications by mouth. Will notify provider.

## 2014-12-14 NOTE — MAU Provider Note (Signed)
History     CSN: [id number]  Arrival date and time: 12/14/14 0253  Provider notified: 0422 & [phone] Provider on unit: 0510 Provider at bedside: 0518     Chief Complaint  Patient presents with  . Pelvic Pain   HPI  [name] [facility] is a 24 yo non-pregnant G0P0 female presenting tonight with complaints of RT sided pelvic pain.  She was seen on Tuesday 3/22 by Dr. Juliene PinaMody for the same complaint and was told she would possibly have laparoscopic surgery to see what is cause of her pelvic pain.  She reports that her pain has been increasing over the last month and the Percocet that she takes "doesn't touch" her pain. She states that her pain is usually a 8-9/10, but now it is a 9-10/10. Her pain is being [facility] for the past 1.5 mos (next appt is in April). That pain clinic has her taking Percocet 5/325 mg TID.  She took her last dose for today at ~ 2200 and she reports that "it does absolutely nothing, but make me sleepy".  She reports N/V, but has not taken any antiemetic. She tearfully states that she is "dying and knows there something wrong inside her pelvis". She stopped taking her Sprintec 10 days ago, because she had continuous brown spotting. She did not let Dr. Juliene PinaMody know that she stopped taking them. She wants to be evaluated for endometriosis, because a friend of hers has endometriosis and her sx's are similar. She states her pain feels like she's been "stabbed in the side". She wants to be admitted for pain management until surgery is scheduled or wants to be given "something to make her more comfortable" before she goes home. Patient refusing Zofran 4 mg ODT; requesting IV Phenergan. She has an extensive PMH as listed below.  Carloyn Jaeger*R. Dawson, CNM engaged in a 26 minute telephone conversation with patient concerning her pelvic pain prior to her arrival in MAU. Patient was advised during that conversation that if her exam was negative  or a medical reason for further treatment that she would not be admitted and no narcotic medication will be administered. Patient was insistent on being seen tonight and not waiting for a first available work-in appointment at [facility].  *Patient attempting to leave AMA @ 0518 prior to CNM physical exam. Past Medical History  Diagnosis Date  . IBS (irritable bowel syndrome)   . Bipolar 1 disorder   . Mononucleosis   . Kidney stones   . PID (acute pelvic inflammatory disease) 04/28/2012  . Chlamydia 04/29/2012  . C. difficile diarrhea   . Anemia   . Anxiety   . Arthritis   . Depression   . Ulcer   . Abnormal Pap smear     colposcopy  . HPV in female   . Fibromyalgia   . Pelvic pain in female 12/14/2014    Past Surgical History  Procedure Laterality Date  . Kidney stone surgery    . Tonsillectomy    . Inner ear surgery    . Wisdom tooth extraction    . Colposcopy    . Laparoscopic cholecystectomy  07/2014    Family History  Problem Relation Age of Onset  . Bipolar disorder Mother   . Cancer Father   . ADD / ADHD Brother   . Cancer Maternal Grandmother   . Cancer Maternal Grandfather   . Cancer Paternal Grandmother   . Cancer Paternal Grandfather  History  Substance Use Topics  . Smoking status: Current Every Day Smoker -- 0.50 packs/day for 5 years    Types: Cigarettes  . Smokeless tobacco: Never Used  . Alcohol Use: No    Allergies:  Allergies  Allergen Reactions  . Azithromycin Other (See Comments)    GI upset and abdominal pain  . Prednisone Other (See Comments)    Impacts Bipolar symptoms  . Bactrim [Sulfamethoxazole-Trimethoprim] Nausea And Vomiting  . Doxycycline Hyclate Nausea And Vomiting  . Flagyl [Metronidazole] Nausea And Vomiting  . Latex Swelling and Other (See Comments)    pain  . Morphine And Related     Panic attacks  . Naproxen Swelling  . Toradol [Ketorolac Tromethamine] Other (See Comments)    Makes arms tingly    No prescriptions  prior to admission    Review of Systems  Constitutional: Negative.   HENT: Negative.   Eyes: Negative.   Respiratory: Negative.   Cardiovascular: Negative.   Gastrointestinal: Negative.   Genitourinary:       Pelvic pain that is worsening over past 2 days  Musculoskeletal: Negative.   Skin: Negative.   Neurological: Negative.   Endo/Heme/Allergies: Negative.   Psychiatric/Behavioral: Negative.    Results for orders placed or performed during the hospital encounter of 12/14/14 (from the past 24 hour(s))  Pregnancy, urine POC     Status: None   Collection Time: 12/14/14  3:23 AM  Result Value Ref Range   Preg Test, Ur NEGATIVE NEGATIVE    US Pelvis Complete  12/14/2014   CLINICAL DATA:  Acute onset of pelvic pain. Current history of endometriosis. Initial encounter.  EXAM: TRANSABDOMINAL AND TRANSVAGINAL ULTRASOUND OF PELVIS  TECHNIQUE: Both transabdominal and transvaginal ultrasound examinations of the pelvis were performed. Transabdominal technique was performed for global imaging of the pelvis including uterus, ovaries, adnexal regions, and pelvic cul-de-sac. It was necessary to proceed with endovaginal exam following the transabdominal exam to visualize the endometrium and ovaries in greater detail.  COMPARISON:  CT of the abdomen and pelvis performed 10/01/2014, and pelvic ultrasound performed 10/13/2013  FINDINGS: Uterus  Measurements: 6.4 x 2.1 x 3.1 cm. No fibroids or other mass visualized.  Endometrium  Thickness: 0.3 cm.  No focal abnormality visualized.  Right ovary  Measurements: 4.0 x 1.8 x 2.0 cm. Normal appearance/no adnexal mass.  Left ovary  Measurements: 2.6 x 1.7 x 1.7 cm. Normal appearance/no adnexal mass.  Other findings  No free fluid is seen within the pelvic cul-de-sac.  IMPRESSION: Unremarkable pelvic ultrasound.   Electronically Signed   By: Roanna Raider M.D.   On: 12/14/2014 04:11   Most Recent Radiologic Imaging Reports from Outside Facility:  US Pelvis  Complete from 09/29/2014 at [facility] Normal ultrasound of the female pelvis. . Arterial and venous flow are documented by spectral Doppler analysis in both ovaries.    Result Narrative  PELVIC ULTRASOUND: TRANSABDOMINAL AND TRANSVAGINAL WITH DOPPLER, Sep 29, 2014 06:18:29 AM  INDICATION:   R10.31 Abdominal pain, right lower quadrant  COMPARISON: CT abdomen/pelvis earlier today . TRANSABDOMINAL EXAM: TECHNIQUE (Grayscale).  Multiplanar real-time ultrasound of the pelvis utilizing a transabdominal approach was performed.  The scan provides a larger field of view to help identify abnormalities that may be overlooked by the smaller field of view of the transvaginal exam. . FINDINGS: .  Uterus: Normal. No evidence of deep pelvic abnormality. .  Right Ovary/Adnexa:  Better visualized on the transvaginal exam and discussed in detail below. Marland Kitchen  Left Ovary/Adnexa:  Better visualized on the transvaginal exam and discussed in detail below. .  Bladder: Normal. .  Peritoneum: No fluid in the pelvis. . TRANSVAGINAL EXAM: TECHNIQUE (Grayscale): Multiplanar real-time ultrasound of the pelvis utilizing transvaginal approach was performed. The exam provides detailed information about the endometrial canal, ovaries and their blood supply. Marland Kitchen FINDINGS: .  Uterus: Normal size, contour, and echotexture. No focal lesion is identified. The endometrial echo complex measures 2 mm. Length: 6.3 cm; Width: 3.8 cm; Height: 2.4 cm .  Right Ovary/Adnexa: Normal size, contour, and echotexture. No mass. The ovary measures 1.9 x 3.7 x 1.6 cm. Volume = 6 ml. .  Left Ovary/Adnexa: Normal size, contour, and echotexture. No mass. The ovary measures 2.9 x 2.3 x 2.1 cm. Volume = 7 ml. .  Peritoneum: No fluid in the cul-de-sac. Marland Kitchen DOPPLER TECHNIQUE: Color and/or power Doppler combined with spectral Doppler analysis to evaluate blood flow to and from the ovaries was performed. Marland Kitchen FINDINGS: .  Right Ovary:  Arterial and venous blood flow are documented. .  Left Ovary: Arterial and venous blood flow are documented.    CT Pelvis Abdomen W/O Contrast 09/29/2014 at [facility] Result Impression   1. No acute findings in the abdomen or pelvis. 2. Mild intrahepatic biliary duct dilation is better demonstrated on prior studies with IV contrast. Reference prior MRCP for additional findings. 3. Nonobstructing left renal calculus. No hydronephrosis.   Result Narrative  CT STONE STUDY, Sep 29, 2014 04:25:55 AM . INDICATION:  ABDOMINAL PAINR10.31 Abdominal pain, right lower quadrant  . COMPARISON: CT abdomen/pelvis 09/16/2014 . TECHNIQUE:  Axial images of the abdomen and pelvis were obtained without intravenous contrast. Supplemental 2D reformatted images were generated and reviewed as needed. Marland Kitchen LIMITATIONS: Lack of intravenous contrast decreases sensitivity for the detection of solid organ lesions. . LOWER CHEST: .  Mediastinum/hila: Visualized portions are within normal limits. .  Heart/vessels: Within normal limits. .  Pleura: Within normal limits. .  Lungs: 3 mm subpleural nodule in the left lower lobe likely reflects a lymph node, series 3 image 6. . ABDOMEN: .  Liver: Within normal limits. .  Gallbladder/biliary: Cholecystectomy. Mild intrahepatic biliary duct dilation is better demonstrated on prior studies with IV contrast. .  Spleen: Within normal limits. .  Pancreas: Within normal limits. .  Adrenals: Within normal limits. .  Kidneys: 2 mm nonobstructing calculus in the left lower pole. No hydronephrosis. .  Peritoneum/mesenteries: Within normal limits. .  Extraperitoneum: Within normal limits. .  Gastrointestinal tract: Within normal limits. The appendix is not visualized, however there are no inflammatory changes in the right lower quadrant. .  Vascular: Within normal limits. Marland Kitchen PELVIS: .  Peritoneum: Within normal limits. .  Extraperitoneum: Within normal  limits. .  Ureters: Within normal limits. .  Bladder: Within normal limits. .  Reproductive System: Within normal limits. . MSK: Within normal limits. .      Physical Exam   Blood pressure 97/64, pulse 78, temperature 97.8 F (36.6 C), temperature source Oral, resp. rate 18, height  (1.626 m), weight 51.71 kg (114 lb), SpO2 99 %.  Physical Exam  Constitutional: She is oriented to person, place, and time. She appears well-developed and well-nourished.  HENT:  Head: Normocephalic and atraumatic.  Eyes: Pupils are equal, round, and reactive to light.  Neck: Normal range of motion.  Respiratory: Effort normal and breath sounds normal.  GI: Soft. Bowel sounds are normal.  Genitourinary:  Pt refused pelvic  exam stating, "It won't do any good"  Musculoskeletal: Normal range of motion.  Neurological: She is alert and oriented to person, place, and time.  Skin: Skin is warm and dry.  Psychiatric:  Patient crying and very tearful; flat affect; does not appear to have pain 10/10 as she reports    MAU Course  Procedures UPT Urine Rapid Drug Screen Pelvic Ultrasound Offered Zofran 4mg  ODT - pt refused / requesting IV Phenergan - no order given for IV medications necessary Offered pelvic exam - pt refused stating, "It won't do any good anyway." Assessment and Plan  24 yo Non Pregnant G0P0 Chronic Pelvic Pain H/O Narcotic Abuse  Discharge home Re-emphasized that pelvic u/s was unremarkable for pelvic pathology Notified that I spoke with Dr. Cherly Hensen and she will not be admitted for pain management Follow-up with [facility] Call the office tomorrow morning to schedule a follow-up appt with Dr. Juliene Pina later this week Instructions given for Pelvic Pain and Abdominal Pain  *Dr. Cherly Hensen notified of patient's attempt to leave AMA, assessment / consulted on plan of care - not authorized to give any medication / order urine drug screen  Raelyn Mora, Judie Petit MSN, CNM 12/14/2014,  5:26 AM     Hx reviewed. Office hx reviewed. UDS (+) opiates. Pt needs to return [facility]

## 2014-12-14 NOTE — MAU Note (Signed)
Pt reports she has been having pelvic pain off/on for a while. Was seen in the office last week and her MD is supposed to be scheduling surgery but the pain is a lot worse.

## 2014-12-14 NOTE — Discharge Instructions (Signed)
Abdominal Pain, Women °Abdominal (stomach, pelvic, or belly) pain can be caused by many things. It is important to tell your doctor: °· The location of the pain. °· Does it come and go or is it present all the time? °· Are there things that start the pain (eating certain foods, exercise)? °· Are there other symptoms associated with the pain (fever, nausea, vomiting, diarrhea)? °All of this is helpful to know when trying to find the cause of the pain. °CAUSES  °· Stomach: virus or bacteria infection, or ulcer. °· Intestine: appendicitis (inflamed appendix), regional ileitis (Crohn's disease), ulcerative colitis (inflamed colon), irritable bowel syndrome, diverticulitis (inflamed diverticulum of the colon), or cancer of the stomach or intestine. °· Gallbladder disease or stones in the gallbladder. °· Kidney disease, kidney stones, or infection. °· Pancreas infection or cancer. °· Fibromyalgia (pain disorder). °· Diseases of the female organs: °¨ Uterus: fibroid (non-cancerous) tumors or infection. °¨ Fallopian tubes: infection or tubal pregnancy. °¨ Ovary: cysts or tumors. °¨ Pelvic adhesions (scar tissue). °¨ Endometriosis (uterus lining tissue growing in the pelvis and on the pelvic organs). °¨ Pelvic congestion syndrome (female organs filling up with blood just before the menstrual period). °¨ Pain with the menstrual period. °¨ Pain with ovulation (producing an egg). °¨ Pain with an IUD (intrauterine device, birth control) in the uterus. °¨ Cancer of the female organs. °· Functional pain (pain not caused by a disease, may improve without treatment). °· Psychological pain. °· Depression. °DIAGNOSIS  °Your doctor will decide the seriousness of your pain by doing an examination. °· Blood tests. °· X-rays. °· Ultrasound. °· CT scan (computed tomography, special type of X-ray). °· MRI (magnetic resonance imaging). °· Cultures, for infection. °· Barium enema (dye inserted in the large intestine, to better view it with  X-rays). °· Colonoscopy (looking in intestine with a lighted tube). °· Laparoscopy (minor surgery, looking in abdomen with a lighted tube). °· Major abdominal exploratory surgery (looking in abdomen with a large incision). °TREATMENT  °The treatment will depend on the cause of the pain.  °· Many cases can be observed and treated at home. °· Over-the-counter medicines recommended by your caregiver. °· Prescription medicine. °· Antibiotics, for infection. °· Birth control pills, for painful periods or for ovulation pain. °· Hormone treatment, for endometriosis. °· Nerve blocking injections. °· Physical therapy. °· Antidepressants. °· Counseling with a psychologist or psychiatrist. °· Minor or major surgery. °HOME CARE INSTRUCTIONS  °· Do not take laxatives, unless directed by your caregiver. °· Take over-the-counter pain medicine only if ordered by your caregiver. Do not take aspirin because it can cause an upset stomach or bleeding. °· Try a clear liquid diet (broth or water) as ordered by your caregiver. Slowly move to a bland diet, as tolerated, if the pain is related to the stomach or intestine. °· Have a thermometer and take your temperature several times a day, and record it. °· Bed rest and sleep, if it helps the pain. °· Avoid sexual intercourse, if it causes pain. °· Avoid stressful situations. °· Keep your follow-up appointments and tests, as your caregiver orders. °· If the pain does not go away with medicine or surgery, you may try: °¨ Acupuncture. °¨ Relaxation exercises (yoga, meditation). °¨ Group therapy. °¨ Counseling. °SEEK MEDICAL CARE IF:  °· You notice certain foods cause stomach pain. °· Your home care treatment is not helping your pain. °· You need stronger pain medicine. °· You want your IUD removed. °· You feel faint or   lightheaded. °· You develop nausea and vomiting. °· You develop a rash. °· You are having side effects or an allergy to your medicine. °SEEK IMMEDIATE MEDICAL CARE IF:  °· Your  pain does not go away or gets worse. °· You have a fever. °· Your pain is felt only in portions of the abdomen. The right side could possibly be appendicitis. The left lower portion of the abdomen could be colitis or diverticulitis. °· You are passing blood in your stools (bright red or black tarry stools, with or without vomiting). °· You have blood in your urine. °· You develop chills, with or without a fever. °· You pass out. °MAKE SURE YOU:  °· Understand these instructions. °· Will watch your condition. °· Will get help right away if you are not doing well or get worse. °Document Released: 07/02/2007 Document Revised: 01/19/2014 Document Reviewed: 07/22/2009 °ExitCare® Patient Information ©2015 [employer]. This information is not intended to replace advice given to you by your health care provider. Make sure you discuss any questions you have with your health care provider. °Pelvic Pain °Female pelvic pain can be caused by many different things and start from a variety of places. Pelvic pain refers to pain that is located in the lower half of the abdomen and between your hips. The pain may occur over a short period of time (acute) or may be reoccurring (chronic). The cause of pelvic pain may be related to disorders affecting the female reproductive organs (gynecologic), but it may also be related to the bladder, kidney stones, an intestinal complication, or muscle or skeletal problems. Getting help right away for pelvic pain is important, especially if there has been severe, sharp, or a sudden onset of unusual pain. It is also important to get help right away because some types of pelvic pain can be life threatening.  °CAUSES  °Below are only some of the causes of pelvic pain. The causes of pelvic pain can be in one of several categories.  °· Gynecologic. °¨ Pelvic inflammatory disease. °¨ Sexually transmitted infection. °¨ Ovarian cyst or a twisted ovarian ligament (ovarian torsion). °¨ Uterine lining that  grows outside the uterus (endometriosis). °¨ Fibroids, cysts, or tumors. °¨ Ovulation. °· Pregnancy. °¨ Pregnancy that occurs outside the uterus (ectopic pregnancy). °¨ Miscarriage. °¨ Labor. °¨ Abruption of the placenta or ruptured uterus. °· Infection. °¨ Uterine infection (endometritis). °¨ Bladder infection. °¨ Diverticulitis. °¨ Miscarriage related to a uterine infection (septic abortion). °· Bladder. °¨ Inflammation of the bladder (cystitis). °¨ Kidney stone(s). °· Gastrointestinal. °¨ Constipation. °¨ Diverticulitis. °· Neurologic. °¨ Trauma. °¨ Feeling pelvic pain because of mental or emotional causes (psychosomatic). °· Cancers of the bowel or pelvis. °EVALUATION  °Your caregiver will want to take a careful history of your concerns. This includes recent changes in your health, a careful gynecologic history of your periods (menses), and a sexual history. Obtaining your family history and medical history is also important. Your caregiver may suggest a pelvic exam. A pelvic exam will help identify the location and severity of the pain. It also helps in the evaluation of which organ system may be involved. In order to identify the cause of the pelvic pain and be properly treated, your caregiver may order tests. These tests may include:  °· A pregnancy test. °· Pelvic ultrasonography. °· An X-ray exam of the abdomen. °· A urinalysis or evaluation of vaginal discharge. °· Blood tests. °HOME CARE INSTRUCTIONS  °· Only take over-the-counter or prescription medicines for pain,   discomfort, or fever as directed by your caregiver.   °· Rest as directed by your caregiver.   °· Eat a balanced diet.   °· Drink enough fluids to make your urine clear or pale yellow, or as directed.   °· Avoid sexual intercourse if it causes pain.   °· Apply warm or cold compresses to the lower abdomen depending on which one helps the pain.   °· Avoid stressful situations.   °· Keep a journal of your pelvic pain. Write down when it started,  where the pain is located, and if there are things that seem to be associated with the pain, such as food or your menstrual cycle. °· Follow up with your caregiver as directed.   °SEEK MEDICAL CARE IF: °· Your medicine does not help your pain. °· You have abnormal vaginal discharge. °SEEK IMMEDIATE MEDICAL CARE IF:  °· You have heavy bleeding from the vagina.   °· Your pelvic pain increases.   °· You feel light-headed or faint.   °· You have chills.   °· You have pain with urination or blood in your urine.   °· You have uncontrolled diarrhea or vomiting.   °· You have a fever or persistent symptoms for more than 3 days. °· You have a fever and your symptoms suddenly get worse.   °· You are being physically or sexually abused.   °MAKE SURE YOU: °· Understand these instructions. °· Will watch your condition. °· Will get help if you are not doing well or get worse. °Document Released: 08/01/2004 Document Revised: 01/19/2014 Document Reviewed: 12/25/2011 °ExitCare® Patient Information ©2015 [employer]. This information is not intended to replace advice given to you by your health care provider. Make sure you discuss any questions you have with your health care provider. ° °

## 2014-12-29 ENCOUNTER — Emergency Department (HOSPITAL_COMMUNITY)
Admission: EM | Admit: 2014-12-29 | Discharge: 2014-12-29 | Disposition: A | Discharge status: Home / Self Care | Payer: Managed Care, Other (non HMO) | Attending: Emergency Medicine | Admitting: Emergency Medicine

## 2014-12-29 ENCOUNTER — Encounter (HOSPITAL_COMMUNITY): Payer: Self-pay | Admitting: Emergency Medicine

## 2014-12-29 ENCOUNTER — Emergency Department (HOSPITAL_COMMUNITY): Payer: Managed Care, Other (non HMO)

## 2014-12-29 DIAGNOSIS — Z720 Tobacco use: Secondary | ICD-10-CM | POA: Diagnosis not present

## 2014-12-29 DIAGNOSIS — Z91040 Latex allergy status: Secondary | ICD-10-CM | POA: Insufficient documentation

## 2014-12-29 DIAGNOSIS — R040 Epistaxis: Secondary | ICD-10-CM | POA: Insufficient documentation

## 2014-12-29 DIAGNOSIS — Z8659 Personal history of other mental and behavioral disorders: Secondary | ICD-10-CM | POA: Insufficient documentation

## 2014-12-29 DIAGNOSIS — M1990 Unspecified osteoarthritis, unspecified site: Secondary | ICD-10-CM | POA: Diagnosis not present

## 2014-12-29 DIAGNOSIS — Z872 Personal history of diseases of the skin and subcutaneous tissue: Secondary | ICD-10-CM | POA: Insufficient documentation

## 2014-12-29 DIAGNOSIS — Z87442 Personal history of urinary calculi: Secondary | ICD-10-CM | POA: Diagnosis not present

## 2014-12-29 DIAGNOSIS — N390 Urinary tract infection, site not specified: Secondary | ICD-10-CM | POA: Insufficient documentation

## 2014-12-29 DIAGNOSIS — M797 Fibromyalgia: Secondary | ICD-10-CM | POA: Insufficient documentation

## 2014-12-29 DIAGNOSIS — Z79899 Other long term (current) drug therapy: Secondary | ICD-10-CM | POA: Insufficient documentation

## 2014-12-29 DIAGNOSIS — R51 Headache: Secondary | ICD-10-CM | POA: Insufficient documentation

## 2014-12-29 DIAGNOSIS — K589 Irritable bowel syndrome without diarrhea: Secondary | ICD-10-CM | POA: Diagnosis not present

## 2014-12-29 DIAGNOSIS — Z862 Personal history of diseases of the blood and blood-forming organs and certain disorders involving the immune mechanism: Secondary | ICD-10-CM | POA: Insufficient documentation

## 2014-12-29 DIAGNOSIS — Z8619 Personal history of other infectious and parasitic diseases: Secondary | ICD-10-CM | POA: Insufficient documentation

## 2014-12-29 DIAGNOSIS — Z8742 Personal history of other diseases of the female genital tract: Secondary | ICD-10-CM | POA: Diagnosis not present

## 2014-12-29 DIAGNOSIS — R519 Headache, unspecified: Secondary | ICD-10-CM

## 2014-12-29 DIAGNOSIS — R300 Dysuria: Secondary | ICD-10-CM

## 2014-12-29 LAB — CBC
HCT: 37.3 % (ref 36.0–46.0)
Hemoglobin: 12.5 g/dL (ref 12.0–15.0)
MCH: 31.6 pg (ref 26.0–34.0)
MCHC: 33.5 g/dL (ref 30.0–36.0)
MCV: 94.2 fL (ref 78.0–100.0)
PLATELETS: 249 10*3/uL (ref 150–400)
RBC: 3.96 MIL/uL (ref 3.87–5.11)
RDW: 11.9 % (ref 11.5–15.5)
WBC: 7.3 10*3/uL (ref 4.0–10.5)

## 2014-12-29 LAB — URINE MICROSCOPIC-ADD ON

## 2014-12-29 LAB — URINALYSIS, ROUTINE W REFLEX MICROSCOPIC
BILIRUBIN URINE: NEGATIVE
Glucose, UA: NEGATIVE mg/dL
Hgb urine dipstick: NEGATIVE
Ketones, ur: NEGATIVE mg/dL
NITRITE: NEGATIVE
PH: 8 (ref 5.0–8.0)
Protein, ur: NEGATIVE mg/dL
SPECIFIC GRAVITY, URINE: 1.017 (ref 1.005–1.030)
Urobilinogen, UA: 0.2 mg/dL (ref 0.0–1.0)

## 2014-12-29 LAB — BASIC METABOLIC PANEL
ANION GAP: 7 (ref 5–15)
BUN: 7 mg/dL (ref 6–23)
CALCIUM: 8.9 mg/dL (ref 8.4–10.5)
CO2: 27 mmol/L (ref 19–32)
CREATININE: 0.72 mg/dL (ref 0.50–1.10)
Chloride: 105 mmol/L (ref 96–112)
GFR calc Af Amer: >90 mL/min (ref >90)
Glucose, Bld: 136 mg/dL — ABNORMAL HIGH (ref 70–99)
Potassium: 4.1 mmol/L (ref 3.5–5.1)
Sodium: 139 mmol/L (ref 135–145)

## 2014-12-29 LAB — I-STAT BETA HCG BLOOD, ED (MC, WL, AP ONLY)

## 2014-12-29 MED ORDER — METOCLOPRAMIDE HCL 5 MG/ML IJ SOLN
10.0000 mg | Freq: Once | INTRAMUSCULAR | Status: AC
  Administered 2014-12-29: 10 mg via INTRAVENOUS
  Filled 2014-12-29: qty 2

## 2014-12-29 MED ORDER — DIPHENHYDRAMINE HCL 50 MG/ML IJ SOLN
25.0000 mg | Freq: Once | INTRAMUSCULAR | Status: AC
  Administered 2014-12-29: 25 mg via INTRAVENOUS
  Filled 2014-12-29: qty 1

## 2014-12-29 MED ORDER — MAGNESIUM SULFATE 2 GM/50ML IV SOLN
2.0000 g | Freq: Once | INTRAVENOUS | Status: DC
  Filled 2014-12-29: qty 50

## 2014-12-29 MED ORDER — SODIUM CHLORIDE 0.9 % IV BOLUS (SEPSIS)
1000.0000 mL | Freq: Once | INTRAVENOUS | Status: AC
  Administered 2014-12-29: 1000 mL via INTRAVENOUS

## 2014-12-29 MED ORDER — CEPHALEXIN 500 MG PO CAPS: 500.0000 mg | ORAL_CAPSULE | Freq: Four times a day (QID) | ORAL | Status: DC

## 2014-12-29 MED ORDER — IOHEXOL 300 MG/ML  SOLN
100.0000 mL | Freq: Once | INTRAMUSCULAR | Status: AC | PRN
  Administered 2014-12-29: 100 mL via INTRAVENOUS

## 2014-12-29 MED ORDER — PHENAZOPYRIDINE HCL 200 MG PO TABS: 200.0000 mg | ORAL_TABLET | Freq: Three times a day (TID) | ORAL | Status: DC | PRN

## 2014-12-29 NOTE — Discharge Instructions (Signed)
1. Medications: Keflex, pyridium, usual home medications 2. Treatment: rest, drink plenty of fluids, take medications as prescribed 3. Follow Up: Please followup with your primary doctor in 3 days for discussion of your diagnoses and further evaluation after today's visit; if you do not have a primary care doctor use the resource guide provided to find one; return to the ER for fevers, persistent vomiting, worsening abdominal pain or other concerning symptoms.   General Headache Without Cause A headache is pain or discomfort felt around the head or neck area. The specific cause of a headache may not be found. There are many causes and types of headaches. A few common ones are:  Tension headaches.  Migraine headaches.  Cluster headaches.  Chronic daily headaches. HOME CARE INSTRUCTIONS   Keep all follow-up appointments with your caregiver or any specialist referral.  Only take over-the-counter or prescription medicines for pain or discomfort as directed by your caregiver.  Lie down in a dark, quiet room when you have a headache.  Keep a headache journal to find out what may trigger your migraine headaches. For example, write down:  What you eat and drink.  How much sleep you get.  Any change to your diet or medicines.  Try massage or other relaxation techniques.  Put ice packs or heat on the head and neck. Use these 3 to 4 times per day for 15 to 20 minutes each time, or as needed.  Limit stress.  Sit up straight, and do not tense your muscles.  Quit smoking if you smoke.  Limit alcohol use.  Decrease the amount of caffeine you drink, or stop drinking caffeine.  Eat and sleep on a regular schedule.  Get 7 to 9 hours of sleep, or as recommended by your caregiver.  Keep lights dim if bright lights bother you and make your headaches worse. SEEK MEDICAL CARE IF:   You have problems with the medicines you were prescribed.  Your medicines are not working.  You have a  change from the usual headache.  You have nausea or vomiting. SEEK IMMEDIATE MEDICAL CARE IF:   Your headache becomes severe.  You have a fever.  You have a stiff neck.  You have loss of vision.  You have muscular weakness or loss of muscle control.  You start losing your balance or have trouble walking.  You feel faint or pass out.  You have severe symptoms that are different from your first symptoms. MAKE SURE YOU:   Understand these instructions.  Will watch your condition.  Will get help right away if you are not doing well or get worse. Document Released: 09/04/2005 Document Revised: 11/27/2011 Document Reviewed: 09/20/2011 [facility] Patient Information 2015 [place], [place]. This information is not intended to replace advice given to you by your health care provider. Make sure you discuss any questions you have with your health care provider.

## 2014-12-29 NOTE — ED Notes (Signed)
Explained to patient mag sulfate will be infused for treatment of migraine. Patient states "oh no. That's ok. I don't want it". Provider will be made aware of same.

## 2014-12-29 NOTE — ED Provider Notes (Signed)
CSN: [id number]     Arrival date & time 12/29/14  1329 History   First MD Initiated Contact with Patient 12/29/14 1657     Chief Complaint  Patient presents with  . Migraine  . Emesis     (Consider location/radiation/quality/duration/timing/severity/associated sxs/prior Treatment) The history is provided by the patient and medical records. No language interpreter was used.     Tonya Davidson is a 24 y.o. female  with a hx of IBS, PID, kidney stones, anemia, anxiety, fibromyalgia presents to the Emergency Department complaining of gradual, persistent, progressively worsening headache onset 3 days.  Pt reports she had dental work done 1 week ago. Pt reports he headache developed the day after her dental work (deep fillings in the upper left).  Pt reports the headache was initially on the left side where the work was done but has now migrated to behind her right eye.  She reports taking tylenol, sinus medication, caffeine and motrin without relief.  Pt reports no persistent pain in her teeth.  Pt denise a Hx of migraine headache.  Pt reports blurry vision but no diplopia.  Pt reports epistaxis today, now resolved.  Pt reports vomiting 7-8 times today.  Pt reports light sensitivity.    Pt reports she has dysuria as well.  Pt reports fever, chills, and vomiting today. She denies abd pain at this time, diarrhea, weakness, dizziness.  Pt reports she feels like she has a UTI.  She has tried cranberry juice without relief.     Past Medical History  Diagnosis Date  . IBS (irritable bowel syndrome)   . Bipolar 1 disorder   . Mononucleosis   . Kidney stones   . PID (acute pelvic inflammatory disease) 04/28/2012  . Chlamydia 04/29/2012  . C. difficile diarrhea   . Anemia   . Anxiety   . Arthritis   . Depression   . Ulcer   . Abnormal Pap smear     colposcopy  . HPV in female   . Fibromyalgia   . Pelvic pain in female 12/14/2014   Past Surgical History  Procedure Laterality Date  . Kidney  stone surgery    . Tonsillectomy    . Inner ear surgery    . Wisdom tooth extraction    . Colposcopy    . Laparoscopic cholecystectomy     Family History  Problem Relation Age of Onset  . Bipolar disorder Mother   . Cancer Father   . ADD / ADHD Brother   . Cancer Maternal Grandmother   . Cancer Maternal Grandfather   . Cancer Paternal Grandmother   . Cancer Paternal Grandfather    History  Substance Use Topics  . Smoking status: Current Every Day Smoker -- 0.50 packs/day for 5 years    Types: Cigarettes  . Smokeless tobacco: Never Used  . Alcohol Use: No   OB History    Gravida Para Term Preterm AB TAB SAB Ectopic Multiple Living   0              Review of Systems  Constitutional: Negative for fever, diaphoresis, appetite change, fatigue and unexpected weight change.  HENT: Positive for nosebleeds. Negative for mouth sores.   Eyes: Negative for visual disturbance.  Respiratory: Negative for cough, chest tightness, shortness of breath and wheezing.   Cardiovascular: Negative for chest pain.  Gastrointestinal: Positive for vomiting. Negative for nausea, abdominal pain, diarrhea and constipation.  Endocrine: Negative for polydipsia, polyphagia and polyuria.  Genitourinary: Positive for dysuria.  Negative for urgency, frequency and hematuria.  Musculoskeletal: Negative for back pain and neck stiffness.  Skin: Negative for rash.  Allergic/Immunologic: Negative for immunocompromised state.  Neurological: Positive for headaches. Negative for syncope and light-headedness.  Hematological: Does not bruise/bleed easily.  Psychiatric/Behavioral: Negative for sleep disturbance. The patient is not nervous/anxious.       Allergies  Azithromycin; Prednisone; Bactrim; Doxycycline hyclate; Flagyl; Latex; Morphine and related; Naproxen; and Toradol  Home Medications   Prior to Admission medications   Medication Sig Start Date End Date Taking? Authorizing Provider  acetaminophen  (TYLENOL) 500 MG tablet Take 1,000 mg by mouth every 6 (six) hours as needed for mild pain.   Yes Historical Provider, MD  cephALEXin (KEFLEX) 500 MG capsule Take 1 capsule (500 mg total) by mouth 4 (four) times daily. 12/29/14   Karsen Nakanishi, PA-C  dicyclomine (BENTYL) 20 MG tablet Take 1 tablet (20 mg total) by mouth 2 (two) times daily. 10/13/14   Antony Madura, PA-C  famotidine (PEPCID) 20 MG tablet Take 1 tablet (20 mg total) by mouth 2 (two) times daily. Patient not taking: Reported on 09/29/2014 07/26/14   Junius Finner, PA-C  HYDROcodone-acetaminophen (HYCET) 7.5-325 mg/15 ml solution Take 10 mLs by mouth every 6 (six) hours as needed for moderate pain or severe pain. Patient not taking: Reported on 09/29/2014 06/19/14   April Palumbo, MD  omeprazole (PRILOSEC) 20 MG capsule Take 1 capsule (20 mg total) by mouth daily. Patient not taking: Reported on 09/29/2014 06/19/14   April Palumbo, MD  ondansetron (ZOFRAN) 4 MG tablet Take 1 tablet (4 mg total) by mouth every 6 (six) hours. 10/13/14   Antony Madura, PA-C  oxyCODONE-acetaminophen (PERCOCET/ROXICET) 5-325 MG per tablet Take 1 tablet by mouth every 6 (six) hours as needed for severe pain. 10/02/14   Charlestine Night, PA-C  phenazopyridine (PYRIDIUM) 200 MG tablet Take 1 tablet (200 mg total) by mouth 3 (three) times daily as needed for pain. 12/29/14   Ashante Yellin, PA-C  promethazine (PHENERGAN) 25 MG tablet Take 1 tablet (25 mg total) by mouth every 8 (eight) hours as needed for nausea or vomiting. 10/02/14   Christopher Lawyer, PA-C  sucralfate (CARAFATE) 1 GM/10ML suspension Take 10 mLs (1 g total) by mouth 4 (four) times daily -  with meals and at bedtime. Patient not taking: Reported on 09/29/2014 06/19/14   April Palumbo, MD   BP 91/69 mmHg  Pulse 72  Temp(Src) 97.9 F (36.6 C) (Oral)  Resp 18  Ht  (1.626 m)  Wt 114 lb (51.71 kg)  BMI 19.56 kg/m2  SpO2 96%  LMP 12/15/2014 Physical Exam  Constitutional: She is oriented  to person, place, and time. She appears well-developed and well-nourished. No distress.  HENT:  Head: Normocephalic and atraumatic.  Right Ear: Tympanic membrane, external ear and ear canal normal.  Left Ear: Tympanic membrane, external ear and ear canal normal.  Nose: Nose normal. No epistaxis. Right sinus exhibits no maxillary sinus tenderness and no frontal sinus tenderness. Left sinus exhibits no maxillary sinus tenderness and no frontal sinus tenderness.  Mouth/Throat: Uvula is midline, oropharynx is clear and moist and mucous membranes are normal. Mucous membranes are not pale and not cyanotic. No oropharyngeal exudate, posterior oropharyngeal edema, posterior oropharyngeal erythema or tonsillar abscesses.  Eyes: Conjunctivae and EOM are normal. Pupils are equal, round, and reactive to light. No scleral icterus.  No horizontal, vertical or rotational nystagmus  Neck: Normal range of motion and full passive range of motion without  pain. Neck supple.  Full active and passive ROM without pain No midline or paraspinal tenderness No nuchal rigidity or meningeal signs  Cardiovascular: Normal rate, regular rhythm, normal heart sounds and intact distal pulses.   No murmur heard. Pulmonary/Chest: Effort normal and breath sounds normal. No stridor. No respiratory distress. She has no wheezes. She has no rales.  Clear and equal breath sounds without focal wheezes, rhonchi, rales  Abdominal: Soft. Bowel sounds are normal. She exhibits no distension and no mass. There is no tenderness. There is no rebound and no guarding.  Musculoskeletal: Normal range of motion.  Lymphadenopathy:    She has no cervical adenopathy.  Neurological: She is alert and oriented to person, place, and time. She has normal reflexes. No cranial nerve deficit. She exhibits normal muscle tone. Coordination normal.  Mental Status:  Alert, oriented, thought content appropriate. Speech fluent without evidence of aphasia. Able to  follow 2 step commands without difficulty.  Cranial Nerves:  II:  Peripheral visual fields grossly normal, pupils equal, round, reactive to light III,IV, VI: ptosis not present, extra-ocular motions intact bilaterally  V,VII: smile symmetric, facial light touch sensation equal VIII: hearing grossly normal bilaterally  IX,X: gag reflex present  XI: bilateral shoulder shrug equal and strong XII: midline tongue extension  Motor:  5/5 in upper and lower extremities bilaterally including strong and equal grip strength and dorsiflexion/plantar flexion Sensory: Pinprick and light touch normal in all extremities.  Deep Tendon Reflexes: 2+ and symmetric  Cerebellar: normal finger-to-nose with bilateral upper extremities Gait: normal gait and balance CV: distal pulses palpable throughout   Skin: Skin is warm and dry. No rash noted. She is not diaphoretic.  Psychiatric: She has a normal mood and affect. Her behavior is normal. Judgment and thought content normal.  Nursing note and vitals reviewed.   ED Course  Procedures (including critical care time) Labs Review Labs Reviewed  BASIC METABOLIC PANEL - Abnormal; Notable for the following:    Glucose, Bld 136 (*)    All other components within normal limits  URINALYSIS, ROUTINE W REFLEX MICROSCOPIC - Abnormal; Notable for the following:    APPearance CLOUDY (*)    Leukocytes, UA SMALL (*)    All other components within normal limits  URINE MICROSCOPIC-ADD ON - Abnormal; Notable for the following:    Squamous Epithelial / LPF FEW (*)    Bacteria, UA FEW (*)    All other components within normal limits  URINE CULTURE  CBC  I-STAT BETA HCG BLOOD, ED ([facility], [facility], [facility] ONLY)    Imaging Review Ct Head Wo Contrast  12/29/2014   CLINICAL DATA:  Migraine, headache  EXAM: CT HEAD WITHOUT CONTRAST  TECHNIQUE: Contiguous axial images were obtained from the base of the skull through the vertex without intravenous contrast.  COMPARISON:  03/11/2012   FINDINGS: No skull fracture is noted. Paranasal sinuses and mastoid air cells are unremarkable. No intracranial hemorrhage, mass effect or midline shift. No acute cortical infarction. No mass lesion is noted on this unenhanced scan. The gray and white-matter differentiation is preserved.  IMPRESSION: No acute intracranial abnormality.  No significant change.   Electronically Signed   By: Natasha Mead M.D.   On: 12/29/2014 21:05   Ct Maxillofacial W/cm  12/29/2014   CLINICAL DATA:  Migraine, emesis. Symptoms for 2 days. History of dental work 1 week prior. Pressure behind right eye.  EXAM: CT MAXILLOFACIAL WITH CONTRAST  TECHNIQUE: Multidetector CT imaging of the maxillofacial structures was performed with  intravenous contrast. Multiplanar CT image reconstructions were also generated. A small metallic BB was placed on the right temple in order to reliably differentiate right from left.  CONTRAST:  100mL OMNIPAQUE IOHEXOL 300 MG/ML  SOLN  COMPARISON:  Concurrently performed noncontrast head CT. Sinus CT 09/04/2011  FINDINGS: Normal appearance of the orbits and globes. No retrobulbar soft tissue edema. No fluid collection involving the soft tissues of the face. No periapical lucency or periapical bony destructive change. Unchanged size and appearance of left maxillary sinus mucous retention cyst allowing for differences in technique. The paranasal sinuses are otherwise well-aerated. No fluid levels. No acute fracture or acute osseous abnormality. No abnormal enhancement in the included portion of the brain.  IMPRESSION: 1. Unchanged left maxillary sinus mucous retention cyst. 2. Otherwise normal CT of the face.   Electronically Signed   By: Rubye OaksMelanie  Ehinger M.D.   On: 12/29/2014 21:15     EKG Interpretation None      MDM   Final diagnoses:  Nonintractable headache, unspecified chronicity pattern, unspecified headache type  UTI (lower urinary tract infection)  Dysuria   Lorie ApleyLindsay M Paolucci presents with  headache unlike previous without a hx of migraine.  Pt with normal neurologic exam.  Will obtain CT head and face to r/o intracranial mass or facial/dental infection as the source of her headache.  Patient is afebrile and non-tachycardic.  No nuchal rigidity or meningeal signs. Doubt meningitis.  Patient given pain control for headache.  8:35 PM Labs reassuring; pending CT head.    9:23 PM Ct without acute abnormality or evidence of dental abscess of facial infection.  Pt with improvement but without complete resolution of headache.  Will give magnesium.  Urinalysis with 11-20 white blood cells.  Patient is with symptoms. Will culture and treat.  Pt has requested narcotic pain control for her headache and dysuria which I have declined as I do not feel it is the safest option to treat her pain.  I have offered other choices such as pyridium which she has declined.    Pt HA treated and improved while in ED.  Presentation is non [facility], ICH, Meningitis, or temporal arteritis. Pt is afebrile with no focal neuro deficits, nuchal rigidity.  Her change in vision has improved.  She is out of bed, eating, drinking and talking with the lights on without signs of distress. She ambulates without gait disturbance.  Pt is to follow up with PCP or neurology to discuss prophylactic medication. Pt verbalizes understanding and is agreeable with plan to dc.    BP 91/69 mmHg  Pulse 72  Temp(Src) 97.9 F (36.6 C) (Oral)  Resp 18  Ht 5\' 4"  (1.626 m)  Wt 114 lb (51.71 kg)  BMI 19.56 kg/m2  SpO2 96%  LMP 12/15/2014   Dierdre ForthHannah Ersa Delaney, PA-C 12/30/14 0013  Benjiman CoreNathan Pickering, MD 12/30/14 [phone]

## 2014-12-29 NOTE — ED Notes (Signed)
Pt states migraine increasing over the last two days. Pt states she's been vomiting since 9 this morning. "It feels like there's an icepick behind my eyeballs." Denies neck pain/stiffness. States she is sensitive to light.

## 2014-12-29 NOTE — ED Notes (Signed)
Nurse starting IV 

## 2014-12-29 NOTE — ED Notes (Addendum)
In to d/c patient and answer d/c questions. Patient inquiring about "what to do if I have another migraine". Explained to patient d/c instructions are to f/u with PCP in 3 days, and [facility] in 2 days. Also d/c packet includes information on headaches of different causes, including migraine and emphasized section that discusses when to seek medical attention. Patient verbalizes understanding. Ambulatory upon d/c in NAD, accompanied by female companion.

## 2014-12-31 LAB — URINE CULTURE
COLONY COUNT: NO GROWTH
Culture: NO GROWTH

## 2015-12-18 DIAGNOSIS — Z131 Encounter for screening for diabetes mellitus: Secondary | ICD-10-CM | POA: Diagnosis not present

## 2015-12-18 DIAGNOSIS — Z1159 Encounter for screening for other viral diseases: Secondary | ICD-10-CM | POA: Diagnosis not present

## 2015-12-18 DIAGNOSIS — Z136 Encounter for screening for cardiovascular disorders: Secondary | ICD-10-CM | POA: Diagnosis not present

## 2015-12-18 DIAGNOSIS — Z0000 Encounter for general adult medical examination without abnormal findings: Secondary | ICD-10-CM | POA: Diagnosis not present

## 2015-12-24 DIAGNOSIS — N200 Calculus of kidney: Secondary | ICD-10-CM | POA: Diagnosis not present

## 2015-12-27 DIAGNOSIS — G4700 Insomnia, unspecified: Secondary | ICD-10-CM | POA: Diagnosis not present

## 2015-12-27 DIAGNOSIS — R109 Unspecified abdominal pain: Secondary | ICD-10-CM | POA: Diagnosis not present

## 2015-12-27 DIAGNOSIS — Z79899 Other long term (current) drug therapy: Secondary | ICD-10-CM | POA: Diagnosis not present

## 2015-12-27 DIAGNOSIS — F3189 Other bipolar disorder: Secondary | ICD-10-CM | POA: Diagnosis not present

## 2015-12-27 DIAGNOSIS — R197 Diarrhea, unspecified: Secondary | ICD-10-CM | POA: Diagnosis not present

## 2015-12-27 DIAGNOSIS — R3129 Other microscopic hematuria: Secondary | ICD-10-CM | POA: Diagnosis not present

## 2015-12-27 DIAGNOSIS — G8929 Other chronic pain: Secondary | ICD-10-CM | POA: Diagnosis not present

## 2015-12-27 DIAGNOSIS — R52 Pain, unspecified: Secondary | ICD-10-CM | POA: Diagnosis not present

## 2015-12-27 DIAGNOSIS — Z881 Allergy status to other antibiotic agents status: Secondary | ICD-10-CM | POA: Diagnosis not present

## 2015-12-27 DIAGNOSIS — R300 Dysuria: Secondary | ICD-10-CM | POA: Diagnosis not present

## 2015-12-27 DIAGNOSIS — M797 Fibromyalgia: Secondary | ICD-10-CM | POA: Diagnosis not present

## 2015-12-27 DIAGNOSIS — Z886 Allergy status to analgesic agent status: Secondary | ICD-10-CM | POA: Diagnosis not present

## 2015-12-27 DIAGNOSIS — Z888 Allergy status to other drugs, medicaments and biological substances status: Secondary | ICD-10-CM | POA: Diagnosis not present

## 2015-12-27 DIAGNOSIS — F17210 Nicotine dependence, cigarettes, uncomplicated: Secondary | ICD-10-CM | POA: Diagnosis not present

## 2015-12-27 DIAGNOSIS — M545 Low back pain: Secondary | ICD-10-CM | POA: Diagnosis not present

## 2015-12-27 DIAGNOSIS — K589 Irritable bowel syndrome without diarrhea: Secondary | ICD-10-CM | POA: Diagnosis not present

## 2015-12-28 DIAGNOSIS — R109 Unspecified abdominal pain: Secondary | ICD-10-CM | POA: Diagnosis not present

## 2016-01-13 DIAGNOSIS — N3000 Acute cystitis without hematuria: Secondary | ICD-10-CM | POA: Diagnosis not present

## 2016-01-13 DIAGNOSIS — F319 Bipolar disorder, unspecified: Secondary | ICD-10-CM | POA: Diagnosis not present

## 2016-01-13 DIAGNOSIS — R110 Nausea: Secondary | ICD-10-CM | POA: Diagnosis not present

## 2016-01-13 DIAGNOSIS — R300 Dysuria: Secondary | ICD-10-CM | POA: Diagnosis not present

## 2016-01-13 DIAGNOSIS — N3090 Cystitis, unspecified without hematuria: Secondary | ICD-10-CM | POA: Diagnosis not present

## 2016-01-13 DIAGNOSIS — G4700 Insomnia, unspecified: Secondary | ICD-10-CM | POA: Diagnosis not present

## 2016-01-13 DIAGNOSIS — K589 Irritable bowel syndrome without diarrhea: Secondary | ICD-10-CM | POA: Diagnosis not present

## 2016-02-03 DIAGNOSIS — Z720 Tobacco use: Secondary | ICD-10-CM | POA: Diagnosis not present

## 2016-02-03 DIAGNOSIS — E221 Hyperprolactinemia: Secondary | ICD-10-CM | POA: Diagnosis not present

## 2016-02-03 DIAGNOSIS — N926 Irregular menstruation, unspecified: Secondary | ICD-10-CM | POA: Diagnosis not present

## 2016-02-03 DIAGNOSIS — E229 Hyperfunction of pituitary gland, unspecified: Secondary | ICD-10-CM | POA: Diagnosis not present

## 2016-02-09 DIAGNOSIS — N390 Urinary tract infection, site not specified: Secondary | ICD-10-CM | POA: Diagnosis not present

## 2016-02-09 DIAGNOSIS — R300 Dysuria: Secondary | ICD-10-CM | POA: Diagnosis not present

## 2016-02-09 DIAGNOSIS — R110 Nausea: Secondary | ICD-10-CM | POA: Diagnosis not present

## 2016-02-13 DIAGNOSIS — N760 Acute vaginitis: Secondary | ICD-10-CM | POA: Diagnosis not present

## 2016-02-13 DIAGNOSIS — M797 Fibromyalgia: Secondary | ICD-10-CM | POA: Diagnosis not present

## 2016-02-13 DIAGNOSIS — R109 Unspecified abdominal pain: Secondary | ICD-10-CM | POA: Diagnosis not present

## 2016-02-13 DIAGNOSIS — F17210 Nicotine dependence, cigarettes, uncomplicated: Secondary | ICD-10-CM | POA: Diagnosis not present

## 2016-02-13 DIAGNOSIS — G43909 Migraine, unspecified, not intractable, without status migrainosus: Secondary | ICD-10-CM | POA: Diagnosis not present

## 2016-02-23 DIAGNOSIS — N761 Subacute and chronic vaginitis: Secondary | ICD-10-CM | POA: Diagnosis not present

## 2016-02-23 DIAGNOSIS — N898 Other specified noninflammatory disorders of vagina: Secondary | ICD-10-CM | POA: Diagnosis not present

## 2016-02-23 DIAGNOSIS — Y929 Unspecified place or not applicable: Secondary | ICD-10-CM | POA: Diagnosis not present

## 2016-02-23 DIAGNOSIS — X58XXXA Exposure to other specified factors, initial encounter: Secondary | ICD-10-CM | POA: Diagnosis not present

## 2016-02-23 DIAGNOSIS — F17210 Nicotine dependence, cigarettes, uncomplicated: Secondary | ICD-10-CM | POA: Diagnosis not present

## 2016-02-23 DIAGNOSIS — S30814A Abrasion of vagina and vulva, initial encounter: Secondary | ICD-10-CM | POA: Diagnosis not present

## 2016-03-01 DIAGNOSIS — N200 Calculus of kidney: Secondary | ICD-10-CM | POA: Diagnosis not present

## 2016-03-01 DIAGNOSIS — N390 Urinary tract infection, site not specified: Secondary | ICD-10-CM | POA: Diagnosis not present

## 2016-03-01 DIAGNOSIS — R109 Unspecified abdominal pain: Secondary | ICD-10-CM | POA: Diagnosis not present

## 2016-03-01 DIAGNOSIS — Z681 Body mass index (BMI) 19 or less, adult: Secondary | ICD-10-CM | POA: Diagnosis not present

## 2016-03-08 DIAGNOSIS — N200 Calculus of kidney: Secondary | ICD-10-CM | POA: Diagnosis not present

## 2016-03-08 DIAGNOSIS — N390 Urinary tract infection, site not specified: Secondary | ICD-10-CM | POA: Diagnosis not present

## 2016-03-09 DIAGNOSIS — N390 Urinary tract infection, site not specified: Secondary | ICD-10-CM | POA: Diagnosis not present

## 2016-03-15 DIAGNOSIS — N390 Urinary tract infection, site not specified: Secondary | ICD-10-CM | POA: Diagnosis not present

## 2016-03-15 DIAGNOSIS — Z681 Body mass index (BMI) 19 or less, adult: Secondary | ICD-10-CM | POA: Diagnosis not present

## 2016-04-08 DIAGNOSIS — N200 Calculus of kidney: Secondary | ICD-10-CM | POA: Diagnosis not present

## 2016-04-26 DIAGNOSIS — Z681 Body mass index (BMI) 19 or less, adult: Secondary | ICD-10-CM | POA: Diagnosis not present

## 2016-04-26 DIAGNOSIS — N390 Urinary tract infection, site not specified: Secondary | ICD-10-CM | POA: Diagnosis not present

## 2016-09-14 ENCOUNTER — Encounter (HOSPITAL_COMMUNITY): Payer: Self-pay | Admitting: Emergency Medicine

## 2016-09-14 ENCOUNTER — Inpatient Hospital Stay (HOSPITAL_COMMUNITY)
Admission: EM | Admit: 2016-09-14 | Discharge: 2016-09-16 | DRG: 690 | Disposition: A | Discharge status: Home / Self Care | Payer: BLUE CROSS/BLUE SHIELD | Attending: Internal Medicine | Admitting: Internal Medicine

## 2016-09-14 DIAGNOSIS — Z881 Allergy status to other antibiotic agents status: Secondary | ICD-10-CM

## 2016-09-14 DIAGNOSIS — Z87440 Personal history of urinary (tract) infections: Secondary | ICD-10-CM

## 2016-09-14 DIAGNOSIS — F319 Bipolar disorder, unspecified: Secondary | ICD-10-CM | POA: Diagnosis present

## 2016-09-14 DIAGNOSIS — Z87442 Personal history of urinary calculi: Secondary | ICD-10-CM

## 2016-09-14 DIAGNOSIS — Z87891 Personal history of nicotine dependence: Secondary | ICD-10-CM

## 2016-09-14 DIAGNOSIS — R109 Unspecified abdominal pain: Secondary | ICD-10-CM | POA: Diagnosis not present

## 2016-09-14 DIAGNOSIS — Z885 Allergy status to narcotic agent status: Secondary | ICD-10-CM | POA: Diagnosis not present

## 2016-09-14 DIAGNOSIS — Z8619 Personal history of other infectious and parasitic diseases: Secondary | ICD-10-CM | POA: Diagnosis not present

## 2016-09-14 DIAGNOSIS — K589 Irritable bowel syndrome without diarrhea: Secondary | ICD-10-CM | POA: Diagnosis not present

## 2016-09-14 DIAGNOSIS — Z818 Family history of other mental and behavioral disorders: Secondary | ICD-10-CM | POA: Diagnosis not present

## 2016-09-14 DIAGNOSIS — N10 Acute tubulo-interstitial nephritis: Principal | ICD-10-CM | POA: Diagnosis present

## 2016-09-14 DIAGNOSIS — Z765 Malingerer [conscious simulation]: Secondary | ICD-10-CM

## 2016-09-14 DIAGNOSIS — N12 Tubulo-interstitial nephritis, not specified as acute or chronic: Secondary | ICD-10-CM | POA: Diagnosis not present

## 2016-09-14 DIAGNOSIS — Z888 Allergy status to other drugs, medicaments and biological substances status: Secondary | ICD-10-CM | POA: Diagnosis not present

## 2016-09-14 DIAGNOSIS — M797 Fibromyalgia: Secondary | ICD-10-CM | POA: Diagnosis present

## 2016-09-14 DIAGNOSIS — Z793 Long term (current) use of hormonal contraceptives: Secondary | ICD-10-CM

## 2016-09-14 DIAGNOSIS — N200 Calculus of kidney: Secondary | ICD-10-CM | POA: Diagnosis not present

## 2016-09-14 LAB — URINALYSIS, ROUTINE W REFLEX MICROSCOPIC
BILIRUBIN URINE: NEGATIVE
GLUCOSE, UA: NEGATIVE mg/dL
KETONES UR: NEGATIVE mg/dL
NITRITE: POSITIVE — AB
PH: 6 (ref 5.0–8.0)
Protein, ur: NEGATIVE mg/dL
SPECIFIC GRAVITY, URINE: 1.005 (ref 1.005–1.030)

## 2016-09-14 LAB — POC URINE PREG, ED: Preg Test, Ur: NEGATIVE

## 2016-09-14 MED ORDER — ONDANSETRON HCL 4 MG/2ML IJ SOLN
4.0000 mg | Freq: Once | INTRAMUSCULAR | Status: AC
  Administered 2016-09-14: 4 mg via INTRAVENOUS
  Filled 2016-09-14: qty 2

## 2016-09-14 MED ORDER — HYDROMORPHONE HCL 2 MG/ML IJ SOLN
1.0000 mg | Freq: Once | INTRAMUSCULAR | Status: AC
  Administered 2016-09-15: 1 mg via INTRAVENOUS
  Filled 2016-09-14: qty 1

## 2016-09-14 MED ORDER — ONDANSETRON HCL 4 MG/2ML IJ SOLN
4.0000 mg | Freq: Once | INTRAMUSCULAR | Status: AC
  Administered 2016-09-15: 4 mg via INTRAVENOUS
  Filled 2016-09-14: qty 2

## 2016-09-14 MED ORDER — HYDROMORPHONE HCL 2 MG/ML IJ SOLN
1.0000 mg | Freq: Once | INTRAMUSCULAR | Status: AC
  Administered 2016-09-14: 1 mg via INTRAVENOUS
  Filled 2016-09-14: qty 1

## 2016-09-14 MED ORDER — DEXTROSE 5 % IV SOLN
1.0000 g | Freq: Once | INTRAVENOUS | Status: AC
  Administered 2016-09-14: 1 g via INTRAVENOUS
  Filled 2016-09-14: qty 10

## 2016-09-14 MED ORDER — SODIUM CHLORIDE 0.9 % IV BOLUS (SEPSIS)
1000.0000 mL | Freq: Once | INTRAVENOUS | Status: AC
  Administered 2016-09-14: 1000 mL via INTRAVENOUS

## 2016-09-14 NOTE — ED Triage Notes (Signed)
Patient reports right flank pain, pain with urination, and N/V x2 days. Hx kidney stones. Patient states "this feels like a kidney stone." Denies diarrhea. Ambulatory to triage.

## 2016-09-14 NOTE — ED Provider Notes (Signed)
[facility]-EMERGENCY DEPT Provider Note   CSN: [id number] Arrival date & time: 09/14/16  1823     History   Chief Complaint Chief Complaint  Patient presents with  . Flank Pain    HPI Tonya Davidson is a 25 y.o. female.  HPI  Patient presents with concern of ongoing flank pain, right-sided. Patient has history of recurrent urinary tract infection, takes Bactrim daily, and over the past few days has been taking Macrobid as she felt something was different. Over the past day she has felt increasing pain in the right flank, abdomen generally, as well as had darker urine than usual. There is associated nausea, weakness. No relief in spite of using additional antibiotics, or anything else. No clear exacerbating or precipitating circumstances.   Past Medical History:  Diagnosis Date  . Abnormal Pap smear    colposcopy  . Anemia   . Anxiety   . Arthritis   . Bipolar 1 disorder (HCC)   . C. difficile diarrhea   . Chlamydia 04/29/2012  . Depression   . Fibromyalgia   . HPV in female   . IBS (irritable bowel syndrome)   . Kidney stones   . Mononucleosis   . Pelvic pain in female 12/14/2014  . PID (acute pelvic inflammatory disease) 04/28/2012  . Ulcer [facility](HCC)     Patient Active Problem List   Diagnosis Date Noted  . Pelvic pain in female 12/14/2014  . Major depressive disorder, recurrent episode, severe, without mention of psychotic behavior 10/03/2013  . GAD (generalized anxiety disorder) 09/30/2013  . PID (acute pelvic inflammatory disease) 04/28/2012  . ANXIETY 05/18/2009  . IBS 05/18/2009    Past Surgical History:  Procedure Laterality Date  . COLPOSCOPY    . INNER EAR SURGERY    . KIDNEY STONE SURGERY    . LAPAROSCOPIC CHOLECYSTECTOMY    . TONSILLECTOMY    . WISDOM TOOTH EXTRACTION      OB History    Gravida Para Term Preterm AB Living   0             SAB TAB Ectopic Multiple Live Births                   Home Medications    Prior to Admission  medications   Medication Sig Start Date End Date Taking? Authorizing Provider  DiphenhydrAMINE HCl, Sleep, (ZZZQUIL) 25 MG CAPS Take 1 capsule by mouth daily.   Yes Historical Provider, MD  drospirenone-ethinyl estradiol (YAZ,GIANVI,LORYNA) 3-0.02 MG tablet Take 1 tablet by mouth daily.   Yes Historical Provider, MD  Homeopathic Products (AZO YEAST PLUS PO) Take 1 tablet by mouth at bedtime.   Yes Historical Provider, MD  ibuprofen (ADVIL,MOTRIN) 200 MG tablet Take 200 mg by mouth every 6 (six) hours as needed for moderate pain.   Yes Historical Provider, MD  nitrofurantoin (MACRODANTIN) 100 MG capsule Take 100 mg by mouth daily as needed (uti symptoms).   Yes Historical Provider, MD  phenazopyridine (PYRIDIUM) 200 MG tablet Take 1 tablet (200 mg total) by mouth 3 (three) times daily as needed for pain. 12/29/14  Yes Hannah Muthersbaugh, PA-C  cephALEXin (KEFLEX) 500 MG capsule Take 1 capsule (500 mg total) by mouth 4 (four) times daily. Patient not taking: Reported on 09/14/2016 12/29/14   Dahlia ClientHannah Muthersbaugh, PA-C  dicyclomine (BENTYL) 20 MG tablet Take 1 tablet (20 mg total) by mouth 2 (two) times daily. Patient not taking: Reported on 09/14/2016 10/13/14   Antony MaduraKelly Humes, PA-C  famotidine (  PEPCID) 20 MG tablet Take 1 tablet (20 mg total) by mouth 2 (two) times daily. Patient not taking: Reported on 09/29/2014 07/26/14   Junius FinnerErin O'Malley, PA-C  HYDROcodone-acetaminophen (HYCET) 7.5-325 mg/15 ml solution Take 10 mLs by mouth every 6 (six) hours as needed for moderate pain or severe pain. Patient not taking: Reported on 09/29/2014 06/19/14   April Palumbo, MD  omeprazole (PRILOSEC) 20 MG capsule Take 1 capsule (20 mg total) by mouth daily. Patient not taking: Reported on 09/29/2014 06/19/14   April Palumbo, MD  ondansetron (ZOFRAN) 4 MG tablet Take 1 tablet (4 mg total) by mouth every 6 (six) hours. Patient not taking: Reported on 09/14/2016 10/13/14   Antony MaduraKelly Humes, PA-C  oxyCODONE-acetaminophen  (PERCOCET/ROXICET) 5-325 MG per tablet Take 1 tablet by mouth every 6 (six) hours as needed for severe pain. Patient not taking: Reported on 09/14/2016 10/02/14   Charlestine Nighthristopher Lawyer, PA-C  promethazine (PHENERGAN) 25 MG tablet Take 1 tablet (25 mg total) by mouth every 8 (eight) hours as needed for nausea or vomiting. Patient not taking: Reported on 09/14/2016 10/02/14   Charlestine Nighthristopher Lawyer, PA-C  sucralfate (CARAFATE) 1 GM/10ML suspension Take 10 mLs (1 g total) by mouth 4 (four) times daily -  with meals and at bedtime. Patient not taking: Reported on 09/29/2014 06/19/14   April Palumbo, MD    Family History Family History  Problem Relation Age of Onset  . Bipolar disorder Mother   . Cancer Father   . ADD / ADHD Brother   . Cancer Maternal Grandmother   . Cancer Maternal Grandfather   . Cancer Paternal Grandmother   . Cancer Paternal Grandfather     Social History Social History  Substance Use Topics  . Smoking status: Current Every Day Smoker    Packs/day: 0.50    Years: 5.00    Types: Cigarettes  . Smokeless tobacco: Never Used  . Alcohol use No     Allergies   Azithromycin; Prednisone; Doxycycline hyclate; Flagyl [metronidazole]; Naproxen; Toradol [ketorolac tromethamine]; and Morphine and related   Review of Systems Review of Systems  Constitutional:       Per HPI, otherwise negative  HENT:       Per HPI, otherwise negative  Respiratory:       Per HPI, otherwise negative  Cardiovascular:       Per HPI, otherwise negative  Gastrointestinal: Positive for nausea and vomiting.  Endocrine:       Negative aside from HPI  Genitourinary:       Neg aside from HPI   Musculoskeletal:       Per HPI, otherwise negative  Skin: Negative.   Neurological: Positive for weakness. Negative for syncope.     Physical Exam Updated Vital Signs BP 101/64 (BP Location: Left Arm)   Pulse 84   Temp 98.2 F (36.8 C) (Oral)   Resp 18   Ht 5\' 4"  (1.626 m)   Wt 100 lb 1.6 oz (45.4  kg)   LMP 08/21/2016   SpO2 100%   BMI 17.18 kg/m   Physical Exam  Constitutional: She is oriented to person, place, and time. She appears well-developed and well-nourished. No distress.  HENT:  Head: Normocephalic and atraumatic.  Eyes: Conjunctivae and EOM are normal.  Cardiovascular: Normal rate and regular rhythm.   Pulmonary/Chest: Effort normal and breath sounds normal. No stridor. No respiratory distress.  Abdominal: She exhibits no distension.  Mild tenderness to palpation about the right costovertebral angle, right suprapubic area, no peritoneal findings,  no rebound.   Musculoskeletal: She exhibits no edema.  Neurological: She is alert and oriented to person, place, and time. No cranial nerve deficit.  Skin: Skin is warm and dry.  Substantial tattoos  Psychiatric: She has a normal mood and affect.  Nursing note and vitals reviewed.    ED Treatments / Results  Labs (all labs ordered are listed, but only abnormal results are displayed) Labs Reviewed  URINALYSIS, ROUTINE W REFLEX MICROSCOPIC - Abnormal; Notable for the following:       Result Value   APPearance CLOUDY (*)    Hgb urine dipstick MODERATE (*)    Nitrite POSITIVE (*)    Leukocytes, UA LARGE (*)    Bacteria, UA MANY (*)    Squamous Epithelial / LPF 6-30 (*)    All other components within normal limits  POC URINE PREG, ED    Initial labs notable for evidence for urinary tract infection, given her flank pain, concern for pyelonephritis.  Procedures Procedures (including critical care time)  Medications Ordered in ED Medications  sodium chloride 0.9 % bolus 1,000 mL (not administered)  sodium chloride 0.9 % bolus 1,000 mL (0 mLs Intravenous Stopped 09/15/16 0020)  HYDROmorphone (DILAUDID) injection 1 mg (1 mg Intravenous Given 09/14/16 2244)  ondansetron (ZOFRAN) injection 4 mg (4 mg Intravenous Given 09/14/16 2244)  cefTRIAXone (ROCEPHIN) 1 g in dextrose 5 % 50 mL IVPB (0 g Intravenous Stopped  09/15/16 0020)  HYDROmorphone (DILAUDID) injection 1 mg (1 mg Intravenous Given 09/15/16 0023)  ondansetron (ZOFRAN) injection 4 mg (4 mg Intravenous Given 09/15/16 0024)     Initial Impression / Assessment and Plan / ED Course  I have reviewed the triage vital signs and the nursing notes.  Pertinent labs & imaging results that were available during my care of the patient were reviewed by me and considered in my medical decision making (see chart for details).  Clinical Course     Now after multiple rounds of pain medication, nausea medication, fluid resuscitation patient continues to complain of pain. She is already on 2 oral antibiotics, with concern for renal progression of urinary tract infection, patient will start ceftriaxone.  With concern for pyelonephritis she will be admitted for further evaluation and management.   Final Clinical Impressions(s) / ED Diagnoses  Pyelonephritis   Gerhard Munch, MD 09/15/16 [phone]

## 2016-09-15 ENCOUNTER — Observation Stay (HOSPITAL_COMMUNITY): Payer: BLUE CROSS/BLUE SHIELD

## 2016-09-15 ENCOUNTER — Encounter (HOSPITAL_COMMUNITY): Payer: Self-pay | Admitting: Radiology

## 2016-09-15 DIAGNOSIS — F319 Bipolar disorder, unspecified: Secondary | ICD-10-CM | POA: Diagnosis present

## 2016-09-15 DIAGNOSIS — N12 Tubulo-interstitial nephritis, not specified as acute or chronic: Secondary | ICD-10-CM | POA: Diagnosis not present

## 2016-09-15 DIAGNOSIS — K589 Irritable bowel syndrome without diarrhea: Secondary | ICD-10-CM | POA: Diagnosis present

## 2016-09-15 DIAGNOSIS — M797 Fibromyalgia: Secondary | ICD-10-CM | POA: Diagnosis present

## 2016-09-15 DIAGNOSIS — R109 Unspecified abdominal pain: Secondary | ICD-10-CM | POA: Diagnosis not present

## 2016-09-15 DIAGNOSIS — Z87440 Personal history of urinary (tract) infections: Secondary | ICD-10-CM | POA: Diagnosis not present

## 2016-09-15 DIAGNOSIS — N200 Calculus of kidney: Secondary | ICD-10-CM | POA: Diagnosis not present

## 2016-09-15 DIAGNOSIS — Z818 Family history of other mental and behavioral disorders: Secondary | ICD-10-CM | POA: Diagnosis not present

## 2016-09-15 DIAGNOSIS — Z793 Long term (current) use of hormonal contraceptives: Secondary | ICD-10-CM | POA: Diagnosis not present

## 2016-09-15 DIAGNOSIS — N10 Acute tubulo-interstitial nephritis: Secondary | ICD-10-CM | POA: Diagnosis present

## 2016-09-15 DIAGNOSIS — Z87442 Personal history of urinary calculi: Secondary | ICD-10-CM | POA: Diagnosis not present

## 2016-09-15 DIAGNOSIS — Z885 Allergy status to narcotic agent status: Secondary | ICD-10-CM | POA: Diagnosis not present

## 2016-09-15 DIAGNOSIS — Z8619 Personal history of other infectious and parasitic diseases: Secondary | ICD-10-CM | POA: Diagnosis not present

## 2016-09-15 DIAGNOSIS — Z765 Malingerer [conscious simulation]: Secondary | ICD-10-CM | POA: Diagnosis not present

## 2016-09-15 DIAGNOSIS — Z87891 Personal history of nicotine dependence: Secondary | ICD-10-CM | POA: Diagnosis not present

## 2016-09-15 DIAGNOSIS — Z888 Allergy status to other drugs, medicaments and biological substances status: Secondary | ICD-10-CM | POA: Diagnosis not present

## 2016-09-15 DIAGNOSIS — Z881 Allergy status to other antibiotic agents status: Secondary | ICD-10-CM | POA: Diagnosis not present

## 2016-09-15 LAB — RAPID URINE DRUG SCREEN, HOSP PERFORMED
AMPHETAMINES: NOT DETECTED
Barbiturates: NOT DETECTED
Benzodiazepines: NOT DETECTED
Cocaine: NOT DETECTED
Opiates: POSITIVE — AB
Tetrahydrocannabinol: NOT DETECTED

## 2016-09-15 LAB — URINALYSIS, ROUTINE W REFLEX MICROSCOPIC
BILIRUBIN URINE: NEGATIVE
Bacteria, UA: NONE SEEN
GLUCOSE, UA: NEGATIVE mg/dL
Ketones, ur: NEGATIVE mg/dL
NITRITE: NEGATIVE
Protein, ur: NEGATIVE mg/dL
Specific Gravity, Urine: 1.01 (ref 1.005–1.030)
pH: 6 (ref 5.0–8.0)

## 2016-09-15 LAB — CBC WITH DIFFERENTIAL/PLATELET
Basophils Absolute: 0 10*3/uL (ref 0.0–0.1)
Basophils Relative: 1 %
Eosinophils Absolute: 0.2 10*3/uL (ref 0.0–0.7)
Eosinophils Relative: 3 %
HEMATOCRIT: 33.8 % — AB (ref 36.0–46.0)
HEMOGLOBIN: 11.8 g/dL — AB (ref 12.0–15.0)
LYMPHS ABS: 3 10*3/uL (ref 0.7–4.0)
LYMPHS PCT: 46 %
MCH: 32 pg (ref 26.0–34.0)
MCHC: 34.9 g/dL (ref 30.0–36.0)
MCV: 91.6 fL (ref 78.0–100.0)
Monocytes Absolute: 0.3 10*3/uL (ref 0.1–1.0)
Monocytes Relative: 5 %
Neutro Abs: 2.9 10*3/uL (ref 1.7–7.7)
Neutrophils Relative %: 45 %
Platelets: 200 10*3/uL (ref 150–400)
RBC: 3.69 MIL/uL — AB (ref 3.87–5.11)
RDW: 11.9 % (ref 11.5–15.5)
WBC: 6.4 10*3/uL (ref 4.0–10.5)

## 2016-09-15 LAB — BASIC METABOLIC PANEL
Anion gap: 7 (ref 5–15)
BUN: 9 mg/dL (ref 6–20)
CHLORIDE: 107 mmol/L (ref 101–111)
CO2: 22 mmol/L (ref 22–32)
Calcium: 7.6 mg/dL — ABNORMAL LOW (ref 8.9–10.3)
Creatinine, Ser: 0.64 mg/dL (ref 0.44–1.00)
GFR calc non Af Amer: >60 mL/min (ref >60)
Glucose, Bld: 114 mg/dL — ABNORMAL HIGH (ref 65–99)
Potassium: 3.8 mmol/L (ref 3.5–5.1)
SODIUM: 136 mmol/L (ref 135–145)

## 2016-09-15 MED ORDER — DOCUSATE SODIUM 100 MG PO CAPS: 100.0000 mg | ORAL_CAPSULE | Freq: Two times a day (BID) | ORAL | Status: DC

## 2016-09-15 MED ORDER — SODIUM CHLORIDE 0.9 % IV SOLN: INTRAVENOUS | Status: DC

## 2016-09-15 MED ORDER — SODIUM CHLORIDE 0.9 % IV SOLN
INTRAVENOUS | Status: DC
  Administered 2016-09-15 (×2): via INTRAVENOUS

## 2016-09-15 MED ORDER — PHENAZOPYRIDINE HCL 200 MG PO TABS
200.0000 mg | ORAL_TABLET | Freq: Three times a day (TID) | ORAL | Status: DC
  Administered 2016-09-15 – 2016-09-16 (×3): 200 mg via ORAL
  Filled 2016-09-15 (×6): qty 1

## 2016-09-15 MED ORDER — DROSPIRENONE-ETHINYL ESTRADIOL 3-0.02 MG PO TABS
1.0000 | ORAL_TABLET | Freq: Every day | ORAL | Status: DC
  Administered 2016-09-15 – 2016-09-16 (×2): 1 via ORAL

## 2016-09-15 MED ORDER — ONDANSETRON HCL 4 MG/2ML IJ SOLN: 4.0000 mg | Freq: Three times a day (TID) | INTRAMUSCULAR | Status: AC | PRN

## 2016-09-15 MED ORDER — ENOXAPARIN SODIUM 40 MG/0.4ML ~~LOC~~ SOLN: 40.0000 mg | SUBCUTANEOUS | Status: DC

## 2016-09-15 MED ORDER — DEXTROSE 5 % IV SOLN
1.0000 g | INTRAVENOUS | Status: DC
  Administered 2016-09-15: 1 g via INTRAVENOUS
  Filled 2016-09-15 (×2): qty 10

## 2016-09-15 MED ORDER — OXYCODONE HCL 5 MG PO TABS
5.0000 mg | ORAL_TABLET | ORAL | Status: DC | PRN
  Administered 2016-09-15: 5 mg via ORAL
  Filled 2016-09-15: qty 1

## 2016-09-15 MED ORDER — OXYCODONE HCL 5 MG PO TABS
5.0000 mg | ORAL_TABLET | ORAL | Status: DC | PRN
  Administered 2016-09-15 – 2016-09-16 (×6): 10 mg via ORAL
  Filled 2016-09-15 (×6): qty 2

## 2016-09-15 MED ORDER — SODIUM CHLORIDE 0.9 % IV BOLUS (SEPSIS)
1000.0000 mL | Freq: Once | INTRAVENOUS | Status: AC
  Administered 2016-09-15: 1000 mL via INTRAVENOUS

## 2016-09-15 MED ORDER — ONDANSETRON HCL 4 MG/2ML IJ SOLN
4.0000 mg | Freq: Four times a day (QID) | INTRAMUSCULAR | Status: DC | PRN
  Administered 2016-09-15 – 2016-09-16 (×3): 4 mg via INTRAVENOUS
  Filled 2016-09-15 (×3): qty 2

## 2016-09-15 MED ORDER — OXYCODONE-ACETAMINOPHEN 5-325 MG PO TABS
2.0000 | ORAL_TABLET | Freq: Once | ORAL | Status: AC
  Administered 2016-09-15: 2 via ORAL
  Filled 2016-09-15: qty 2

## 2016-09-15 MED ORDER — OXYCODONE HCL 5 MG PO TABS
5.0000 mg | ORAL_TABLET | Freq: Once | ORAL | Status: AC
  Administered 2016-09-15: 5 mg via ORAL
  Filled 2016-09-15: qty 1

## 2016-09-15 MED ORDER — SENNA 8.6 MG PO TABS: 2.0000 | ORAL_TABLET | Freq: Every day | ORAL | Status: DC

## 2016-09-15 MED ORDER — KETOROLAC TROMETHAMINE 30 MG/ML IJ SOLN
30.0000 mg | Freq: Four times a day (QID) | INTRAMUSCULAR | Status: DC | PRN
  Filled 2016-09-15: qty 1

## 2016-09-15 MED ORDER — SENNOSIDES-DOCUSATE SODIUM 8.6-50 MG PO TABS
2.0000 | ORAL_TABLET | Freq: Two times a day (BID) | ORAL | Status: DC
  Administered 2016-09-16: 2 via ORAL
  Filled 2016-09-15: qty 2

## 2016-09-15 MED ORDER — ZOLPIDEM TARTRATE 5 MG PO TABS
5.0000 mg | ORAL_TABLET | Freq: Once | ORAL | Status: AC
  Administered 2016-09-15: 5 mg via ORAL
  Filled 2016-09-15: qty 1

## 2016-09-15 MED ORDER — HYDROMORPHONE HCL 2 MG/ML IJ SOLN
0.5000 mg | Freq: Once | INTRAMUSCULAR | Status: AC
  Administered 2016-09-15: 0.5 mg via INTRAVENOUS
  Filled 2016-09-15: qty 1

## 2016-09-15 MED ORDER — ZOLPIDEM TARTRATE 5 MG PO TABS
5.0000 mg | ORAL_TABLET | Freq: Once | ORAL | Status: DC
  Filled 2016-09-15: qty 1

## 2016-09-15 NOTE — H&P (Signed)
History and Physical    Tonya Davidson:[id number] DOB: Dec 10, 1990 DOA: 09/14/2016   PCP: Garth Schlatter, MD Chief Complaint:  Chief Complaint  Patient presents with  . Flank Pain    HPI: Tonya Davidson is a 25 y.o. female with medical history significant of recurrent UTIs.  On daily bactrim and over past few days has been taking macrobid due to increased flank pain.  Today presents to the ED for 1 day history of increasing flank pain to R flank and abdomen.  Darker urine than usual.  Associated nausea and weakness.  Pain "feels like a kidney stone".  ED Course: UA shows UTI, patient given rocephin.  EDP didn't want to get CT scan due to very high frequency of prior CTs on this patient.  Review of Systems: As per HPI otherwise 10 point review of systems negative.    Past Medical History:  Diagnosis Date  . Abnormal Pap smear    colposcopy  . Anemia   . Anxiety   . Arthritis   . Bipolar 1 disorder (HCC)   . C. difficile diarrhea   . Chlamydia 04/29/2012  . Depression   . Fibromyalgia   . HPV in female   . IBS (irritable bowel syndrome)   . Kidney stones   . Mononucleosis   . Pelvic pain in female 12/14/2014  . PID (acute pelvic inflammatory disease) 04/28/2012  . Ulcer [facility])     Past Surgical History:  Procedure Laterality Date  . COLPOSCOPY    . INNER EAR SURGERY    . KIDNEY STONE SURGERY    . LAPAROSCOPIC CHOLECYSTECTOMY    . TONSILLECTOMY    . WISDOM TOOTH EXTRACTION       reports that she has been smoking Cigarettes.  She has a 2.50 pack-year smoking history. She has never used smokeless tobacco. She reports that she uses drugs, including Marijuana. She reports that she does not drink alcohol.  Allergies  Allergen Reactions  . Azithromycin Other (See Comments)    GI upset and abdominal pain  . Prednisone Other (See Comments)    Impacts Bipolar symptoms   . Doxycycline Hyclate Nausea And Vomiting  . Flagyl [Metronidazole] Nausea And Vomiting  .  Naproxen Swelling  . Toradol [Ketorolac Tromethamine] Other (See Comments)    Makes arms tingly  . Morphine And Related Palpitations    Family History  Problem Relation Age of Onset  . Bipolar disorder Mother   . Cancer Father   . ADD / ADHD Brother   . Cancer Maternal Grandmother   . Cancer Maternal Grandfather   . Cancer Paternal Grandmother   . Cancer Paternal Grandfather       Prior to Admission medications   Medication Sig Start Date End Date Taking? Authorizing Provider  DiphenhydrAMINE HCl, Sleep, (ZZZQUIL) 25 MG CAPS Take 1 capsule by mouth daily.   Yes Historical Provider, MD  drospirenone-ethinyl estradiol (YAZ,GIANVI,LORYNA) 3-0.02 MG tablet Take 1 tablet by mouth daily.   Yes Historical Provider, MD  Homeopathic Products (AZO YEAST PLUS PO) Take 1 tablet by mouth at bedtime.   Yes Historical Provider, MD  ibuprofen (ADVIL,MOTRIN) 200 MG tablet Take 200 mg by mouth every 6 (six) hours as needed for moderate pain.   Yes Historical Provider, MD  nitrofurantoin (MACRODANTIN) 100 MG capsule Take 100 mg by mouth daily as needed (uti symptoms).   Yes Historical Provider, MD  phenazopyridine (PYRIDIUM) 200 MG tablet Take 1 tablet (200 mg total) by mouth 3 (  three) times daily as needed for pain. 12/29/14  Yes Hannah Muthersbaugh, PA-C    Physical Exam: Vitals:   09/14/16 1838 09/14/16 1839 09/14/16 2029 09/15/16 0013  BP: 102/73  101/64 90/55  Pulse: 107  84 63  Resp: 18  18 18   Temp: 98.2 F (36.8 C)   97.8 F (36.6 C)  TempSrc: Oral   Oral  SpO2: 100%  100% 99%  Weight:  47.2 kg (104 lb) 45.4 kg (100 lb 1.6 oz)   Height:  5\' 4"  (1.626 m)        Constitutional: NAD, calm, comfortable Eyes: PERRL, lids and conjunctivae normal ENMT: Mucous membranes are moist. Posterior pharynx clear of any exudate or lesions.Normal dentition.  Neck: normal, supple, no masses, no thyromegaly Respiratory: clear to auscultation bilaterally, no wheezing, no crackles. Normal respiratory  effort. No accessory muscle use.  Cardiovascular: Regular rate and rhythm, no murmurs / rubs / gallops. No extremity edema. 2+ pedal pulses. No carotid bruits.  Abdomen: no tenderness, no masses palpated. No hepatosplenomegaly. Bowel sounds positive.  Musculoskeletal: no clubbing / cyanosis. No joint deformity upper and lower extremities. Good ROM, no contractures. Normal muscle tone.  Skin: no rashes, lesions, ulcers. No induration Neurologic: CN 2-12 grossly intact. Sensation intact, DTR normal. Strength 5/5 in all 4.  Psychiatric: Normal judgment and insight. Alert and oriented x 3. Normal mood.    Labs on Admission: I have personally reviewed following labs and imaging studies  CBC: No results for input(s): WBC, NEUTROABS, HGB, HCT, MCV, PLT in the last 168 hours. Basic Metabolic Panel: No results for input(s): NA, K, CL, CO2, GLUCOSE, BUN, CREATININE, CALCIUM, MG, PHOS in the last 168 hours. GFR: CrCl cannot be calculated (Patient's most recent lab result is older than the maximum 21 days allowed.). Liver Function Tests: No results for input(s): AST, ALT, ALKPHOS, BILITOT, PROT, ALBUMIN in the last 168 hours. No results for input(s): LIPASE, AMYLASE in the last 168 hours. No results for input(s): AMMONIA in the last 168 hours. Coagulation Profile: No results for input(s): INR, PROTIME in the last 168 hours. Cardiac Enzymes: No results for input(s): CKTOTAL, CKMB, CKMBINDEX, TROPONINI in the last 168 hours. BNP (last 3 results) No results for input(s): PROBNP in the last 8760 hours. HbA1C: No results for input(s): HGBA1C in the last 72 hours. CBG: No results for input(s): GLUCAP in the last 168 hours. Lipid Profile: No results for input(s): CHOL, HDL, LDLCALC, TRIG, CHOLHDL, LDLDIRECT in the last 72 hours. Thyroid Function Tests: No results for input(s): TSH, T4TOTAL, FREET4, T3FREE, THYROIDAB in the last 72 hours. Anemia Panel: No results for input(s): VITAMINB12, FOLATE,  FERRITIN, TIBC, IRON, RETICCTPCT in the last 72 hours. Urine analysis:    Component Value Date/Time   COLORURINE YELLOW 09/14/2016 2037   APPEARANCEUR CLOUDY (A) 09/14/2016 2037   LABSPEC 1.005 09/14/2016 2037   PHURINE 6.0 09/14/2016 2037   GLUCOSEU NEGATIVE 09/14/2016 2037   HGBUR MODERATE (A) 09/14/2016 2037   HGBUR large 09/20/2010 1442   BILIRUBINUR NEGATIVE 09/14/2016 2037   KETONESUR NEGATIVE 09/14/2016 2037   PROTEINUR NEGATIVE 09/14/2016 2037   UROBILINOGEN 0.2 12/29/2014 1913   NITRITE POSITIVE (A) 09/14/2016 2037   LEUKOCYTESUR LARGE (A) 09/14/2016 2037   Sepsis Labs: @LABRCNTIP (procalcitonin:4,lacticidven:4) )No results found for this or any previous visit (from the past 240 hour(s)).   Radiological Exams on Admission: No results found.  EKG: Independently reviewed.  Assessment/Plan Active Problems:   Pyelonephritis    1. Pyelonephritis -  1.  Renal US ordered to r/o obstructing stone / pyohydronephrosis 2. Rocephin 3. toradol PRN pain 4. NS for hydration 5. zofran PRN nausea 6. CBC/BMP pending   DVT prophylaxis: Lovenox Code Status: Full Family Communication: Family at bedside Consults called: None Admission status: Place in [place]   GARDNER, Heywood IlesJARED M. DO [facility] Pager [phone] from 7PM-7AM  If 7AM-7PM, please contact the day physician for the patient [email or web address] Password [email or web address]  09/15/2016, 1:22 AM

## 2016-09-15 NOTE — Progress Notes (Signed)
PROGRESS NOTE  Tonya ApleyLindsay M Davidson ZOX:[id number] DOB: 1991-09-07 DOA: 09/14/2016 PCP: Garth SchlatterAMEEN, WILLIAM OTIS, MD  Brief History:  25 year old female with a history of bipolar disorder, fibromyalgia, frequent UTIs, nephrolithiasis, irritable bowel syndrome presented with 2-3 day history of dysuria, flank pain with associated nausea and vomiting. The patient stated that she had 3 episodes on the evening prior to presentation with fever up to 101.88F at home. She claims that she took ibuprofen prior to presenting to the emergency department as reasoning for her afebrile vital signs since admission. Since presentation, her emesis is better but she continues to complain of nausea. She denied any headache, neck pain, chest pain, shortness breath, coughing, diarrhea, hematochezia, melena. In the emergency department, the patient was found to have significant pyuria on her urinalysis. However, she remained afebrile and hemodynamically stable with normal WBC.  The patient was given IV hydromorphone 1mg  x 2 and zofran.  She was admitted for tx of presumptive pyelonephritis.  The patient routinely takes trimethoprim/sulfamethoxazole, 1 tablet daily for UTI prophylaxis. She has also been prescribed nitrofurantoin for preemptive therapy as a self starting medication for any breakthrough UTI. The patient states that she has taken the nitrofurantoin for the last 2 days without relief of the above symptoms. [facility] she [facility]rology, Dr. Marcha [facility]Marshall Hall [facility] when she was given the above instructions regarding antibotics.  She had a CT urogram in June 2017 which was negative for hydronephrosis or stones.   Assessment/Plan: Pyelonephritis -The patient's pain is out of proportion with her objective laboratory and exam findings -She has narcotic seeking behavior -CT renal stone protocol -Continue ceftriaxone empirically -Unfortunately, urine culture was not obtained during her  stay in the emergency department -Repeat UA and urine culture, but this will be low yield at this point as the patient has received intravenous antibiotics already -Urine drug screen -Urine NAA for GC/chlamydia -HIV antibody  Irritable bowel syndrome -Minimize narcotics  Bipolar Disorder -not on maintenance medication presently -Outpatient follow-up   Disposition Plan:   Home in 1-2 days  Family Communication:   Family at bedside--Total time spent 36 minutes.  Greater than 50% spent face to face counseling and coordinating care. [id number] to 0741   Consultants:  none  Code Status:  FULL  DVT Prophylaxis:  [place] Lovenox   Procedures: As Listed in Progress Note Above  Antibiotics: Ceftriaxone 12/28>>>    Subjective: Patient continues to complain of right flank pain. Denies any vomiting but complains of nausea. Denies any fevers, chills, headache, neck pain, chest pain, shortness breath, coughing, hemoptysis. No hematochezia or melena. No diarrhea.  Objective: Vitals:   09/14/16 2029 09/15/16 0013 09/15/16 0214 09/15/16 0457  BP: 101/64 90/55 93/64  (!) 93/54  Pulse: 84 63 70 (!) 58  Resp: 18 18 18 16   Temp:  97.8 F (36.6 C) 97.9 F (36.6 C) 98.2 F (36.8 C)  TempSrc:  Oral Oral Oral  SpO2: 100% 99% 100% 99%  Weight: 45.4 kg (100 lb 1.6 oz)  45.9 kg (101 lb 3.1 oz)   Height:   5\' 4"  (1.626 m)     Intake/Output Summary (Last 24 hours) at 09/15/16 0733 Last data filed at 09/15/16 0020  Gross per 24 hour  Intake             1049 ml  Output                0 ml  Net             1049 ml   Weight change:  Exam:   General:  Pt is alert, follows commands appropriately, not in acute distress  HEENT: No icterus, No thrush, No neck mass, [place]/AT  Cardiovascular: RRR, S1/S2, no rubs, no gallops  Respiratory: CTA bilaterally, no wheezing, no crackles, no rhonchi  Abdomen: Soft/+BS, Right flank tender, non distended, no guarding; no rebound  Extremities: No edema, No  lymphangitis, No petechiae, No rashes, no synovitis   Data Reviewed: I have personally reviewed following labs and imaging studies Basic Metabolic Panel:  Recent Labs Lab 09/15/16 0114  NA 136  K 3.8  CL 107  CO2 22  GLUCOSE 114*  BUN 9  CREATININE 0.64  CALCIUM 7.6*   Liver Function Tests: No results for input(s): AST, ALT, ALKPHOS, BILITOT, PROT, ALBUMIN in the last 168 hours. No results for input(s): LIPASE, AMYLASE in the last 168 hours. No results for input(s): AMMONIA in the last 168 hours. Coagulation Profile: No results for input(s): INR, PROTIME in the last 168 hours. CBC:  Recent Labs Lab 09/15/16 0114  WBC 6.4  NEUTROABS 2.9  HGB 11.8*  HCT 33.8*  MCV 91.6  PLT 200   Cardiac Enzymes: No results for input(s): CKTOTAL, CKMB, CKMBINDEX, TROPONINI in the last 168 hours. BNP: Invalid input(s): POCBNP CBG: No results for input(s): GLUCAP in the last 168 hours. HbA1C: No results for input(s): HGBA1C in the last 72 hours. Urine analysis:    Component Value Date/Time   COLORURINE YELLOW 09/14/2016 2037   APPEARANCEUR CLOUDY (A) 09/14/2016 2037   LABSPEC 1.005 09/14/2016 2037   PHURINE 6.0 09/14/2016 2037   GLUCOSEU NEGATIVE 09/14/2016 2037   HGBUR MODERATE (A) 09/14/2016 2037   HGBUR large 09/20/2010 1442   BILIRUBINUR NEGATIVE 09/14/2016 2037   KETONESUR NEGATIVE 09/14/2016 2037   PROTEINUR NEGATIVE 09/14/2016 2037   UROBILINOGEN 0.2 12/29/2014 1913   NITRITE POSITIVE (A) 09/14/2016 2037   LEUKOCYTESUR LARGE (A) 09/14/2016 2037   Sepsis Labs: @LABRCNTIP (procalcitonin:4,lacticidven:4) )No results found for this or any previous visit (from the past 240 hour(s)).   Scheduled Meds: . cefTRIAXone (ROCEPHIN)  IV  1 g Intravenous Q24H  . drospirenone-ethinyl estradiol  1 tablet Oral Daily  . enoxaparin (LOVENOX) injection  40 mg Subcutaneous Q24H  .  HYDROmorphone (DILAUDID) injection  0.5 mg Intravenous Once  . phenazopyridine  200 mg Oral TID WC    Continuous Infusions: . sodium chloride 75 mL/hr at 09/15/16 0224    Procedures/Studies: No results found.  Jailon Schaible, DO  [facility] Pager [phone]  If 7PM-7AM, please contact night-coverage [email or web address] Password [email or web address] 09/15/2016, 7:33 AM   LOS: 0 days

## 2016-09-16 LAB — CBC
HCT: 32.1 % — ABNORMAL LOW (ref 36.0–46.0)
Hemoglobin: 10.4 g/dL — ABNORMAL LOW (ref 12.0–15.0)
MCH: 30 pg (ref 26.0–34.0)
MCHC: 32.4 g/dL (ref 30.0–36.0)
MCV: 92.5 fL (ref 78.0–100.0)
PLATELETS: 198 10*3/uL (ref 150–400)
RBC: 3.47 MIL/uL — AB (ref 3.87–5.11)
RDW: 12 % (ref 11.5–15.5)
WBC: 4 10*3/uL (ref 4.0–10.5)

## 2016-09-16 LAB — BASIC METABOLIC PANEL
ANION GAP: 7 (ref 5–15)
CALCIUM: 8.3 mg/dL — AB (ref 8.9–10.3)
CO2: 25 mmol/L (ref 22–32)
Chloride: 108 mmol/L (ref 101–111)
Creatinine, Ser: 0.63 mg/dL (ref 0.44–1.00)
GFR calc Af Amer: >60 mL/min (ref >60)
Glucose, Bld: 91 mg/dL (ref 65–99)
POTASSIUM: 4.2 mmol/L (ref 3.5–5.1)
SODIUM: 140 mmol/L (ref 135–145)

## 2016-09-16 LAB — URINE CULTURE: CULTURE: NO GROWTH

## 2016-09-16 MED ORDER — CIPROFLOXACIN HCL 500 MG PO TABS: 500.0000 mg | ORAL_TABLET | Freq: Two times a day (BID) | ORAL | 0 refills | Status: DC

## 2016-09-16 MED ORDER — PROMETHAZINE HCL 12.5 MG PO TABS: 12.5000 mg | ORAL_TABLET | Freq: Four times a day (QID) | ORAL | 0 refills | Status: DC | PRN

## 2016-09-16 MED ORDER — SODIUM CHLORIDE 0.9 % IV BOLUS (SEPSIS)
1000.0000 mL | Freq: Once | INTRAVENOUS | Status: AC
  Administered 2016-09-16: 1000 mL via INTRAVENOUS

## 2016-09-16 MED ORDER — OXYCODONE HCL 5 MG PO TABS
10.0000 mg | ORAL_TABLET | Freq: Once | ORAL | Status: AC
  Administered 2016-09-16: 10 mg via ORAL
  Filled 2016-09-16: qty 2

## 2016-09-16 MED ORDER — OXYCODONE HCL 5 MG PO TABS: 5.0000 mg | ORAL_TABLET | Freq: Four times a day (QID) | ORAL | 0 refills | Status: DC | PRN

## 2016-09-16 NOTE — Progress Notes (Signed)
D/c home with a significant other via wheelchair,. Scripts were given for phenergan ,oxycodone and Cipro. She verbalized understanding of d/c instuctions

## 2016-09-16 NOTE — Progress Notes (Signed)
Patient refusing blood draw this am at 0720. States she wasn't going to be stuck again.

## 2016-09-17 LAB — HIV ANTIBODY (ROUTINE TESTING W REFLEX): HIV Screen 4th Generation wRfx: NONREACTIVE

## 2016-09-19 LAB — GC/CHLAMYDIA PROBE AMP (~~LOC~~) NOT AT ARMC
Chlamydia: NEGATIVE
NEISSERIA GONORRHEA: NEGATIVE

## 2016-09-22 NOTE — Discharge Summary (Addendum)
Physician Discharge Summary  Tonya Davidson WGN:[id number] DOB: 1991/08/19 DOA: 09/14/2016  PCP: Garth Schlatter, MD  Admit date: 09/14/2016 Discharge date: 09/22/2016  Admitted From: Home  Disposition:  Home.   Recommendations for Outpatient Follow-up:  1. Follow up with PCP in 1-2 weeks 2. Please obtain BMP/CBC in one week 3. Please follow up w ith urology as recommended.    Discharge Condition:stable.  CODE STATUS:full code.  Diet recommendation: Heart Healthy   Brief/Interim Summary: 26 year old female with a history of bipolar disorder, fibromyalgia, frequent UTIs, nephrolithiasis, irritable bowel syndrome presented with 2-3 day history of dysuria, flank pain with associated nausea and vomiting. The patient stated that she had 3 episodes on the evening prior to presentation with fever up to 101.12F at home. She claims that she took ibuprofen prior to presenting to the emergency department as reasoning for her afebrile vital signs since admission. Since presentation, her emesis is better but she continues to complain of nausea. She denied any headache, neck pain, chest pain, shortness breath, coughing, diarrhea, hematochezia, melena. In the emergency department, the patient was found to have significant pyuria on her urinalysis. However, she remained afebrile and hemodynamically stable with normal WBC.  The patient was given IV hydromorphone 1mg  x 2 and zofran.  She was admitted for tx of presumptive pyelonephritis.  The patient routinely takes trimethoprim/sulfamethoxazole, 1 tablet daily for UTI prophylaxis. She has also been prescribed nitrofurantoin for preemptive therapy as a self starting medication for any breakthrough UTI. The patient states that she has taken the nitrofurantoin for the last 2 days without relief of the above symptoms. According to [facility], she last saw urology, Dr. Marcha Solders at [facility] when she was given the above instructions regarding  antibotics.  She had a CT urogram in June 2017 which was negative for hydronephrosis or stones.   Discharge Diagnoses:  Active Problems:   IBS   Acute Pyelonephritis   Right flank pain  Acute Pyelonephritis Pain resolved.  -She has narcotic seeking behavior -CT renal stone protocol -Unfortunately, urine culture was not obtained during her stay in the emergency department -Repeat UA and urine culture, but this will be low yield at this point as the patient has received intravenous antibiotics already. Urine cultures negative. -Urine drug screen positive for opiates.  -Urine NAA for GC/chlamydia -HIV antibody negative.   Irritable bowel syndrome -Minimize narcotics on discharge.   Bipolar Disorder -not on maintenance medication presently -Outpatient follow-up  Discharge Instructions  Discharge Instructions    Diet general    Complete by:  As directed    Discharge instructions    Complete by:  As directed    Follow up with urology in one week, before the antibiotics are done.     Allergies as of 09/16/2016      Reactions   Azithromycin Other (See Comments)   GI upset and abdominal pain   Prednisone Other (See Comments)   Impacts Bipolar symptoms    Doxycycline Hyclate Nausea And Vomiting   Flagyl [metronidazole] Nausea And Vomiting   Naproxen Swelling   Toradol [ketorolac Tromethamine] Other (See Comments)   Makes arms tingly   Morphine And Related Palpitations      Medication List    STOP taking these medications   nitrofurantoin 100 MG capsule Commonly known as:  MACRODANTIN     TAKE these medications   AZO YEAST PLUS PO Take 1 tablet by mouth at bedtime.   ciprofloxacin 500 MG tablet Commonly known as:  CIPRO Take 1 tablet (500 mg total) by mouth 2 (two) times daily.   drospirenone-ethinyl estradiol 3-0.02 MG tablet Commonly known as:  YAZ,GIANVI,LORYNA Take 1 tablet by mouth daily.   ibuprofen 200 MG tablet Commonly known as:  ADVIL,MOTRIN Take  200 mg by mouth every 6 (six) hours as needed for moderate pain.   oxyCODONE 5 MG immediate release tablet Commonly known as:  Oxy IR/ROXICODONE Take 1 tablet (5 mg total) by mouth every 6 (six) hours as needed for moderate pain.   phenazopyridine 200 MG tablet Commonly known as:  PYRIDIUM Take 1 tablet (200 mg total) by mouth 3 (three) times daily as needed for pain.   promethazine 12.5 MG tablet Commonly known as:  PHENERGAN Take 1 tablet (12.5 mg total) by mouth every 6 (six) hours as needed for nausea or vomiting.   ZZZQUIL 25 MG Caps Generic drug:  DiphenhydrAMINE HCl (Sleep) Take 1 capsule by mouth daily.      Follow-up Information    Garth Schlatter, MD. Schedule an appointment as soon as possible for a visit in 1 week(s).   Specialty:  Family Medicine Contact information: [facility] [address] [place] [address] [phone]        [facility] Schedule an appointment as soon as possible for a visit in 1 week(s).   Why:  new referral .  Contact information: [address] [place] [address] [phone]          Allergies  Allergen Reactions  . Azithromycin Other (See Comments)    GI upset and abdominal pain  . Prednisone Other (See Comments)    Impacts Bipolar symptoms   . Doxycycline Hyclate Nausea And Vomiting  . Flagyl [Metronidazole] Nausea And Vomiting  . Naproxen Swelling  . Toradol [Ketorolac Tromethamine] Other (See Comments)    Makes arms tingly  . Morphine And Related Palpitations    Consultations:  none   Procedures/Studies: Ct Renal Stone Study  Result Date: 09/15/2016 CLINICAL DATA:  ?kidney stone, pt. C/o bilateral flank pain x's 2 days, pt. States pain has been worse on the rt., hx of kidney stones EXAM: CT ABDOMEN AND PELVIS WITHOUT CONTRAST TECHNIQUE: Multidetector CT imaging of the abdomen and pelvis was performed following the standard protocol without IV  contrast. COMPARISON:  03/01/2016 FINDINGS: Lower chest: Clear lung bases.  Heart normal size. Hepatobiliary: No focal liver abnormality is seen. No gallstones, gallbladder wall thickening, or biliary dilatation. Pancreas: Unremarkable. No pancreatic ductal dilatation or surrounding inflammatory changes. Spleen: Normal in size without focal abnormality. Adrenals/Urinary Tract: No adrenal masses. Kidneys are normal in size, shape and position with no masses, stones or hydronephrosis. Normal ureters. Bladder is unremarkable. Stomach/Bowel: Normal stomach and small bowel. Mild increased stool noted throughout the colon. Colon otherwise unremarkable. Normal appendix visualized. Vascular/Lymphatic: No significant vascular findings are present. No enlarged abdominal or pelvic lymph nodes. Reproductive: Uterus and bilateral adnexa are unremarkable. Other: No abdominal wall hernia or abnormality. No abdominopelvic ascites. Musculoskeletal: No acute or significant osseous findings. IMPRESSION: 1. No acute findings. No evidence of renal or ureteral stones or obstructive uropathy. 2. Mild generalized increased stool throughout the colon. No bowel inflammation. No other abnormality. Electronically Signed   By: Amie Portland M.D.   On: 09/15/2016 08:50       Subjective: No new complaints.   Discharge Exam: Vitals:   09/16/16 0726 09/16/16 1106  BP: (!) 89/60 90/60  Pulse: 60 (!)  59  Resp:    Temp:     Vitals:   09/16/16 0100 09/16/16 0659 09/16/16 0726 09/16/16 1106  BP: (!) 86/54  (!) 89/60 90/60  Pulse: (!) 57 72 60 (!) 59  Resp:  16    Temp:  98.4 F (36.9 C)    TempSrc:  Oral    SpO2:  98%    Weight:      Height:        General: Pt is alert, awake, not in acute distress Cardiovascular: RRR, S1/S2 +, no rubs, no gallops Respiratory: CTA bilaterally, no wheezing, no rhonchi Abdominal: Soft, NT, ND, bowel sounds + Extremities: no edema, no cyanosis    The results of significant diagnostics  from this hospitalization (including imaging, microbiology, ancillary and laboratory) are listed below for reference.     Microbiology: Recent Results (from the past 240 hour(s))  Urine culture     Status: None   Collection Time: 09/15/16 11:11 AM  Result Value Ref Range Status   Specimen Description URINE, RANDOM  Final   Special Requests NONE  Final   Culture NO GROWTH [facility]formed at [facility]   Final   Report Status 09/16/2016 FINAL  Final     Labs: BNP (last 3 results) No results for input(s): BNP in the last 8760 hours. Basic Metabolic Panel:  Recent Labs Lab 09/16/16 0404  NA 140  K 4.2  CL 108  CO2 25  GLUCOSE 91  BUN <5*  CREATININE 0.63  CALCIUM 8.3*   Liver Function Tests: No results for input(s): AST, ALT, ALKPHOS, BILITOT, PROT, ALBUMIN in the last 168 hours. No results for input(s): LIPASE, AMYLASE in the last 168 hours. No results for input(s): AMMONIA in the last 168 hours. CBC:  Recent Labs Lab 09/16/16 0404  WBC 4.0  HGB 10.4*  HCT 32.1*  MCV 92.5  PLT 198   Cardiac Enzymes: No results for input(s): CKTOTAL, CKMB, CKMBINDEX, TROPONINI in the last 168 hours. BNP: Invalid input(s): POCBNP CBG: No results for input(s): GLUCAP in the last 168 hours. D-Dimer No results for input(s): DDIMER in the last 72 hours. Hgb A1c No results for input(s): HGBA1C in the last 72 hours. Lipid Profile No results for input(s): CHOL, HDL, LDLCALC, TRIG, CHOLHDL, LDLDIRECT in the last 72 hours. Thyroid function studies No results for input(s): TSH, T4TOTAL, T3FREE, THYROIDAB in the last 72 hours.  Invalid input(s): FREET3 Anemia work up No results for input(s): VITAMINB12, FOLATE, FERRITIN, TIBC, IRON, RETICCTPCT in the last 72 hours. Urinalysis    Component Value Date/Time   COLORURINE YELLOW 09/15/2016 1111   APPEARANCEUR CLEAR 09/15/2016 1111   LABSPEC 1.010 09/15/2016 1111   PHURINE 6.0 09/15/2016 1111   GLUCOSEU NEGATIVE 09/15/2016 1111    HGBUR MODERATE (A) 09/15/2016 1111   HGBUR large 09/20/2010 1442   BILIRUBINUR NEGATIVE 09/15/2016 1111   KETONESUR NEGATIVE 09/15/2016 1111   PROTEINUR NEGATIVE 09/15/2016 1111   UROBILINOGEN 0.2 12/29/2014 1913   NITRITE NEGATIVE 09/15/2016 1111   LEUKOCYTESUR TRACE (A) 09/15/2016 1111   Sepsis Labs Invalid input(s): PROCALCITONIN,  WBC,  LACTICIDVEN Microbiology Recent Results (from the past 240 hour(s))  Urine culture     Status: None   Collection Time: 09/15/16 11:11 AM  Result Value Ref Range Status   Specimen Description URINE, RANDOM  Final   Special Requests NONE  Final   Culture NO GROWTH Performed at [facility]   Final   Report Status 09/16/2016 FINAL  Final  Time coordinating discharge: Over 30 minutes  SIGNED:   Kathlen Mody, MD  [facility] 09/22/2016, 9:48 AM Pager   If 7PM-7AM, please contact night-coverage [email or web address] Password [email or web address]

## 2017-01-08 ENCOUNTER — Encounter (HOSPITAL_COMMUNITY): Payer: Self-pay

## 2017-01-08 DIAGNOSIS — F17210 Nicotine dependence, cigarettes, uncomplicated: Secondary | ICD-10-CM | POA: Insufficient documentation

## 2017-01-08 DIAGNOSIS — N3001 Acute cystitis with hematuria: Secondary | ICD-10-CM | POA: Insufficient documentation

## 2017-01-08 DIAGNOSIS — R109 Unspecified abdominal pain: Secondary | ICD-10-CM | POA: Diagnosis not present

## 2017-01-08 DIAGNOSIS — Z79899 Other long term (current) drug therapy: Secondary | ICD-10-CM | POA: Insufficient documentation

## 2017-01-08 DIAGNOSIS — R1031 Right lower quadrant pain: Secondary | ICD-10-CM | POA: Diagnosis not present

## 2017-01-08 DIAGNOSIS — R1012 Left upper quadrant pain: Secondary | ICD-10-CM | POA: Diagnosis not present

## 2017-01-08 NOTE — ED Notes (Signed)
PT REFUSING BLOOD DRAW IN TRIAGE. PT SAYS SHE ONLY WANTS TO BE STUCK ONCE.

## 2017-01-08 NOTE — ED Notes (Signed)
Pt c/o worsening of right side pain, foul smelling urine with frequency, nausea, fever. Taking macrobid for the past couple of days for UTI.

## 2017-01-09 ENCOUNTER — Emergency Department (HOSPITAL_COMMUNITY)
Admission: EM | Admit: 2017-01-09 | Discharge: 2017-01-09 | Disposition: A | Discharge status: Home / Self Care | Payer: BLUE CROSS/BLUE SHIELD | Attending: Emergency Medicine | Admitting: Emergency Medicine

## 2017-01-09 ENCOUNTER — Emergency Department (HOSPITAL_COMMUNITY): Payer: BLUE CROSS/BLUE SHIELD

## 2017-01-09 DIAGNOSIS — R1031 Right lower quadrant pain: Secondary | ICD-10-CM

## 2017-01-09 DIAGNOSIS — R1012 Left upper quadrant pain: Secondary | ICD-10-CM | POA: Diagnosis not present

## 2017-01-09 DIAGNOSIS — R109 Unspecified abdominal pain: Secondary | ICD-10-CM

## 2017-01-09 DIAGNOSIS — N3001 Acute cystitis with hematuria: Secondary | ICD-10-CM

## 2017-01-09 LAB — GC/CHLAMYDIA PROBE AMP (~~LOC~~) NOT AT ARMC
Chlamydia: NEGATIVE
Neisseria Gonorrhea: NEGATIVE

## 2017-01-09 LAB — COMPREHENSIVE METABOLIC PANEL
ALBUMIN: 4.4 g/dL (ref 3.5–5.0)
ALT: 22 U/L (ref 14–54)
AST: 25 U/L (ref 15–41)
Alkaline Phosphatase: 60 U/L (ref 38–126)
Anion gap: 6 (ref 5–15)
BILIRUBIN TOTAL: 0.9 mg/dL (ref 0.3–1.2)
BUN: 7 mg/dL (ref 6–20)
CO2: 26 mmol/L (ref 22–32)
Calcium: 9.2 mg/dL (ref 8.9–10.3)
Chloride: 105 mmol/L (ref 101–111)
Creatinine, Ser: 0.58 mg/dL (ref 0.44–1.00)
GFR calc Af Amer: >60 mL/min (ref >60)
GFR calc non Af Amer: >60 mL/min (ref >60)
GLUCOSE: 86 mg/dL (ref 65–99)
POTASSIUM: 3.8 mmol/L (ref 3.5–5.1)
Sodium: 137 mmol/L (ref 135–145)
Total Protein: 8 g/dL (ref 6.5–8.1)

## 2017-01-09 LAB — CBC WITH DIFFERENTIAL/PLATELET
BASOS ABS: 0.1 10*3/uL (ref 0.0–0.1)
BASOS PCT: 1 %
Eosinophils Absolute: 0.2 10*3/uL (ref 0.0–0.7)
Eosinophils Relative: 2 %
HEMATOCRIT: 37.1 % (ref 36.0–46.0)
Hemoglobin: 12.8 g/dL (ref 12.0–15.0)
Lymphocytes Relative: 52 %
Lymphs Abs: 4.1 10*3/uL — ABNORMAL HIGH (ref 0.7–4.0)
MCH: 31 pg (ref 26.0–34.0)
MCHC: 34.5 g/dL (ref 30.0–36.0)
MCV: 89.8 fL (ref 78.0–100.0)
MONO ABS: 0.6 10*3/uL (ref 0.1–1.0)
Monocytes Relative: 7 %
NEUTROS ABS: 3.1 10*3/uL (ref 1.7–7.7)
Neutrophils Relative %: 38 %
Platelets: 310 10*3/uL (ref 150–400)
RBC: 4.13 MIL/uL (ref 3.87–5.11)
RDW: 12 % (ref 11.5–15.5)
WBC: 8 10*3/uL (ref 4.0–10.5)

## 2017-01-09 LAB — URINALYSIS, ROUTINE W REFLEX MICROSCOPIC
BILIRUBIN URINE: NEGATIVE
GLUCOSE, UA: NEGATIVE mg/dL
Ketones, ur: NEGATIVE mg/dL
NITRITE: NEGATIVE
PH: 8 (ref 5.0–8.0)
Protein, ur: NEGATIVE mg/dL
SPECIFIC GRAVITY, URINE: 1.006 (ref 1.005–1.030)

## 2017-01-09 LAB — WET PREP, GENITAL
CLUE CELLS WET PREP: NONE SEEN
SPERM: NONE SEEN
TRICH WET PREP: NONE SEEN
Yeast Wet Prep HPF POC: NONE SEEN

## 2017-01-09 LAB — I-STAT BETA HCG BLOOD, ED (MC, WL, AP ONLY): I-stat hCG, quantitative: <5 m[IU]/mL (ref <5)

## 2017-01-09 MED ORDER — CEPHALEXIN 500 MG PO CAPS
500.0000 mg | ORAL_CAPSULE | Freq: Once | ORAL | Status: AC
  Administered 2017-01-09: 500 mg via ORAL
  Filled 2017-01-09: qty 1

## 2017-01-09 MED ORDER — CEPHALEXIN 500 MG PO CAPS: 500.0000 mg | ORAL_CAPSULE | Freq: Two times a day (BID) | ORAL | 0 refills | Status: DC

## 2017-01-09 MED ORDER — FENTANYL CITRATE (PF) 100 MCG/2ML IJ SOLN
50.0000 ug | Freq: Once | INTRAMUSCULAR | Status: AC
  Administered 2017-01-09: 50 ug via INTRAVENOUS
  Filled 2017-01-09: qty 2

## 2017-01-09 MED ORDER — HYDROMORPHONE HCL 1 MG/ML IJ SOLN
1.0000 mg | Freq: Once | INTRAMUSCULAR | Status: AC
  Administered 2017-01-09: 1 mg via INTRAVENOUS
  Filled 2017-01-09: qty 1

## 2017-01-09 MED ORDER — SODIUM CHLORIDE 0.9 % IV BOLUS (SEPSIS)
1000.0000 mL | Freq: Once | INTRAVENOUS | Status: AC
  Administered 2017-01-09: 1000 mL via INTRAVENOUS

## 2017-01-09 MED ORDER — ONDANSETRON HCL 4 MG/2ML IJ SOLN
4.0000 mg | Freq: Once | INTRAMUSCULAR | Status: AC
  Administered 2017-01-09: 4 mg via INTRAVENOUS
  Filled 2017-01-09: qty 2

## 2017-01-09 NOTE — ED Provider Notes (Signed)
[facility]-EMERGENCY DEPT Provider Note   CSN: [id number] Arrival date & time: 01/08/17  2251  By signing my name below, I, Bing Neighbors., attest that this documentation has been prepared under the direction and in the presence of Tomasita Crumble, MD. Electronically signed: Bing Neighbors., ED Scribe. 01/09/17. 5:43 AM.    History   Chief Complaint Chief Complaint  Patient presents with  . Flank Pain  . Urinary Frequency    HPI Tonya Davidson is a 26 y.o. female with hx of C diff. who presents to the Emergency Department complaining of R flank pain with onset x2 weeks. Pt states that x2 weeks ago she started experiencing a stabbing R flank pain that radiates to the back. x2 days ago pt states that she had a  fever, vomiting and reportedly started noticing a foul odor to her urine. Pt reports R flank pain, back pain, nausea, vomiting, fever, vaginal discharge. She has taken Bactrim, Macrobid and Advil x5 hours ago with no relief. Pt denies dysuria, appetite change, vaginal bleeding. Of note, pt takes bactrim daily to prevent UTI but states that she takes Macrobid when Bactrim doesn't work. Pt has been on Macrobid for the past x3 days with no relief.     The history is provided by the patient. No language interpreter was used.    Past Medical History:  Diagnosis Date  . Abnormal Pap smear    colposcopy  . Anemia   . Anxiety   . Arthritis   . Bipolar 1 disorder (HCC)   . C. difficile diarrhea   . Chlamydia 04/29/2012  . Depression   . Fibromyalgia   . HPV in female   . IBS (irritable bowel syndrome)   . Kidney stones   . Mononucleosis   . Pelvic pain in female 12/14/2014  . PID (acute pelvic inflammatory disease) 04/28/2012  . Ulcer     Patient Active Problem List   Diagnosis Date Noted  . Pyelonephritis 09/15/2016  . Right flank pain   . Pelvic pain in female 12/14/2014  . Major depressive disorder, recurrent episode, severe, without mention of psychotic  behavior 10/03/2013  . GAD (generalized anxiety disorder) 09/30/2013  . PID (acute pelvic inflammatory disease) 04/28/2012  . ANXIETY 05/18/2009  . IBS 05/18/2009    Past Surgical History:  Procedure Laterality Date  . COLPOSCOPY    . INNER EAR SURGERY    . KIDNEY STONE SURGERY    . LAPAROSCOPIC CHOLECYSTECTOMY    . TONSILLECTOMY    . WISDOM TOOTH EXTRACTION      OB History    Gravida Para Term Preterm AB Living   0             SAB TAB Ectopic Multiple Live Births                   Home Medications    Prior to Admission medications   Medication Sig Start Date End Date Taking? Authorizing Provider  ciprofloxacin (CIPRO) 500 MG tablet Take 1 tablet (500 mg total) by mouth 2 (two) times daily. 09/16/16   Kathlen Mody, MD  DiphenhydrAMINE HCl, Sleep, (ZZZQUIL) 25 MG CAPS Take 1 capsule by mouth daily.    Historical Provider, MD  drospirenone-ethinyl estradiol (YAZ,GIANVI,LORYNA) 3-0.02 MG tablet Take 1 tablet by mouth daily.    Historical Provider, MD  Homeopathic Products (AZO YEAST PLUS PO) Take 1 tablet by mouth at bedtime.    Historical Provider, MD  ibuprofen (ADVIL,MOTRIN) 200  MG tablet Take 200 mg by mouth every 6 (six) hours as needed for moderate pain.    Historical Provider, MD  oxyCODONE (OXY IR/ROXICODONE) 5 MG immediate release tablet Take 1 tablet (5 mg total) by mouth every 6 (six) hours as needed for moderate pain. 09/16/16   Kathlen Mody, MD  phenazopyridine (PYRIDIUM) 200 MG tablet Take 1 tablet (200 mg total) by mouth 3 (three) times daily as needed for pain. 12/29/14   Hannah Muthersbaugh, PA-C  promethazine (PHENERGAN) 12.5 MG tablet Take 1 tablet (12.5 mg total) by mouth every 6 (six) hours as needed for nausea or vomiting. 09/16/16   Kathlen Mody, MD    Family History Family History  Problem Relation Age of Onset  . Bipolar disorder Mother   . Cancer Father   . ADD / ADHD Brother   . Cancer Maternal Grandmother   . Cancer Maternal Grandfather   .  Cancer Paternal Grandmother   . Cancer Paternal Grandfather     Social History Social History  Substance Use Topics  . Smoking status: Current Every Day Smoker    Packs/day: 0.50    Years: 5.00    Types: Cigarettes  . Smokeless tobacco: Never Used  . Alcohol use No     Allergies   Azithromycin; Prednisone; Doxycycline hyclate; Flagyl [metronidazole]; Naproxen; Toradol [ketorolac tromethamine]; and Morphine and related   Review of Systems Review of Systems  All other systems reviewed and are negative for acute change except as noted in the HPI.   Physical Exam Updated Vital Signs BP 94/69   Pulse 90   Temp 98.4 F (36.9 C) (Oral)   Resp 19   SpO2 95%   Physical Exam  Constitutional: She is oriented to person, place, and time. She appears well-developed and well-nourished. No distress.  HENT:  Head: Normocephalic and atraumatic.  Nose: Nose normal.  Mouth/Throat: Oropharynx is clear and moist. No oropharyngeal exudate.  Eyes: Conjunctivae and EOM are normal. Pupils are equal, round, and reactive to light. No scleral icterus.  Neck: Normal range of motion. Neck supple. No JVD present. No tracheal deviation present. No thyromegaly present.  Cardiovascular: Normal rate, regular rhythm and normal heart sounds.  Exam reveals no gallop and no friction rub.   No murmur heard. Pulmonary/Chest: Effort normal and breath sounds normal. No respiratory distress. She has no wheezes. She exhibits no tenderness.  Abdominal: Soft. Bowel sounds are normal. She exhibits no distension and no mass. There is tenderness in the right lower quadrant. There is rebound. There is no guarding.  RLQ with tenderness to palpation.  Musculoskeletal: Normal range of motion. She exhibits no edema or tenderness.  Lymphadenopathy:    She has no cervical adenopathy.  Neurological: She is alert and oriented to person, place, and time. No cranial nerve deficit. She exhibits normal muscle tone.  Skin: Skin  is warm and dry. No rash noted. No erythema. No pallor.  Nursing note and vitals reviewed.    ED Treatments / Results   DIAGNOSTIC STUDIES: Oxygen Saturation is 98% on RA, normal by my interpretation.   COORDINATION OF CARE: 5:43 AM-Discussed next steps with pt. Pt verbalized understanding and is agreeable with the plan.    Labs (all labs ordered are listed, but only abnormal results are displayed) Labs Reviewed  WET PREP, GENITAL - Abnormal; Notable for the following:       Result Value   WBC, Wet Prep HPF POC MANY (*)    All other components within normal  limits  URINALYSIS, ROUTINE W REFLEX MICROSCOPIC - Abnormal; Notable for the following:    APPearance HAZY (*)    Hgb urine dipstick LARGE (*)    Leukocytes, UA SMALL (*)    Bacteria, UA MANY (*)    Squamous Epithelial / LPF 0-5 (*)    All other components within normal limits  CBC WITH DIFFERENTIAL/PLATELET - Abnormal; Notable for the following:    Lymphs Abs 4.1 (*)    All other components within normal limits  COMPREHENSIVE METABOLIC PANEL  I-STAT BETA HCG BLOOD, ED ([facility], [facility], [facility] ONLY)  GC/CHLAMYDIA PROBE AMP ([place]) NOT AT [facility]    EKG  EKG Interpretation None       Radiology US Transvaginal Non-ob  Result Date: 01/09/2017 CLINICAL DATA:  26 year old female with right lower quadrant and right flank pain. EXAM: TRANSABDOMINAL AND TRANSVAGINAL ULTRASOUND OF PELVIS TECHNIQUE: Both transabdominal and transvaginal ultrasound examinations of the pelvis were performed. Transabdominal technique was performed for global imaging of the pelvis including uterus, ovaries, adnexal regions, and pelvic cul-de-sac. It was necessary to proceed with endovaginal exam following the transabdominal exam to visualize the endometrium and ovaries. COMPARISON:  CT dated 09/15/2016 FINDINGS: Uterus Measurements: 6.6 x 3.0 x 3.5 cm. No fibroids or other mass visualized. Endometrium Thickness: 8 mm.  No focal abnormality visualized. Right  ovary Measurements: 3.1 x 1.9 x 1.7 cm. Normal appearance/no adnexal mass. Left ovary Measurements: 3.3 x 2.8 x 1.7 cm. Normal appearance/no adnexal mass. Other findings No abnormal free fluid. IMPRESSION: Unremarkable pelvic ultrasound. Electronically Signed   By: Elgie Collard M.D.   On: 01/09/2017 05:33   US Pelvis Complete  Result Date: 01/09/2017 CLINICAL DATA:  26 year old female with right lower quadrant and right flank pain. EXAM: TRANSABDOMINAL AND TRANSVAGINAL ULTRASOUND OF PELVIS TECHNIQUE: Both transabdominal and transvaginal ultrasound examinations of the pelvis were performed. Transabdominal technique was performed for global imaging of the pelvis including uterus, ovaries, adnexal regions, and pelvic cul-de-sac. It was necessary to proceed with endovaginal exam following the transabdominal exam to visualize the endometrium and ovaries. COMPARISON:  CT dated 09/15/2016 FINDINGS: Uterus Measurements: 6.6 x 3.0 x 3.5 cm. No fibroids or other mass visualized. Endometrium Thickness: 8 mm.  No focal abnormality visualized. Right ovary Measurements: 3.1 x 1.9 x 1.7 cm. Normal appearance/no adnexal mass. Left ovary Measurements: 3.3 x 2.8 x 1.7 cm. Normal appearance/no adnexal mass. Other findings No abnormal free fluid. IMPRESSION: Unremarkable pelvic ultrasound. Electronically Signed   By: Elgie Collard M.D.   On: 01/09/2017 05:33    Procedures Procedures (including critical care time)  Medications Ordered in ED Medications  ondansetron (ZOFRAN) injection 4 mg (4 mg Intravenous Given 01/09/17 0423)  sodium chloride 0.9 % bolus 1,000 mL (1,000 mLs Intravenous New Bag/Given 01/09/17 0425)  fentaNYL (SUBLIMAZE) injection 50 mcg (50 mcg Intravenous Given 01/09/17 0432)     Initial Impression / Assessment and Plan / ED Course  I have reviewed the triage vital signs and the nursing notes.  Pertinent labs & imaging results that were available during my care of the patient were reviewed by  me and considered in my medical decision making (see chart for details).     Patient presents to the ED for abdominal pain.  She does have bacteria in her urine but states her normal macrobid is not working. UA here also shows mild infection. Will need to change her antibiotics.  Pelvic exam shows some discharge which may be physiologic. She denies concern for STD.  TVUS also neg and as well remainder of lab tests.  She was given fentanyl in the ED as her pain increased.  She states that does not work and the only thing that can help is dilaudid.    She has had multiple CTs in the past, all of which have been negative. I do not think she needs repeat CT tonight.  She was advised to follow up with urology for her chronic UTIs.  She was given one dose of dilaudid prior to DC. She demonstrates good understanding of the plan. She appears well and inNAD. VS remain within her normal limits and she is safe for Dc.  Final Clinical Impressions(s) / ED Diagnoses   Final diagnoses:  Abdominal pain    New Prescriptions New Prescriptions   No medications on file   I personally performed the services described in this documentation, which was scribed in my presence. The recorded information has been reviewed and is accurate.       Tomasita Crumble, MD 01/09/17 [phone]

## 2017-02-01 DIAGNOSIS — Z79899 Other long term (current) drug therapy: Secondary | ICD-10-CM | POA: Diagnosis not present

## 2017-02-01 DIAGNOSIS — R1031 Right lower quadrant pain: Secondary | ICD-10-CM | POA: Diagnosis not present

## 2017-02-01 DIAGNOSIS — N390 Urinary tract infection, site not specified: Secondary | ICD-10-CM | POA: Diagnosis not present

## 2017-02-01 DIAGNOSIS — R109 Unspecified abdominal pain: Secondary | ICD-10-CM | POA: Diagnosis not present

## 2017-02-01 DIAGNOSIS — R3129 Other microscopic hematuria: Secondary | ICD-10-CM | POA: Diagnosis not present

## 2017-02-01 DIAGNOSIS — R350 Frequency of micturition: Secondary | ICD-10-CM | POA: Diagnosis not present

## 2017-02-02 DIAGNOSIS — N390 Urinary tract infection, site not specified: Secondary | ICD-10-CM | POA: Diagnosis not present

## 2017-03-07 DIAGNOSIS — B9620 Unspecified Escherichia coli [E. coli] as the cause of diseases classified elsewhere: Secondary | ICD-10-CM | POA: Diagnosis not present

## 2017-03-07 DIAGNOSIS — N390 Urinary tract infection, site not specified: Secondary | ICD-10-CM | POA: Diagnosis not present

## 2017-03-07 DIAGNOSIS — N3020 Other chronic cystitis without hematuria: Secondary | ICD-10-CM | POA: Diagnosis not present

## 2017-03-07 DIAGNOSIS — R351 Nocturia: Secondary | ICD-10-CM | POA: Diagnosis not present

## 2017-03-12 DIAGNOSIS — Z113 Encounter for screening for infections with a predominantly sexual mode of transmission: Secondary | ICD-10-CM | POA: Diagnosis not present

## 2017-03-12 DIAGNOSIS — N898 Other specified noninflammatory disorders of vagina: Secondary | ICD-10-CM | POA: Diagnosis not present

## 2017-03-12 DIAGNOSIS — N390 Urinary tract infection, site not specified: Secondary | ICD-10-CM | POA: Diagnosis not present

## 2017-03-12 DIAGNOSIS — Z01411 Encounter for gynecological examination (general) (routine) with abnormal findings: Secondary | ICD-10-CM | POA: Diagnosis not present

## 2017-03-12 DIAGNOSIS — Z01419 Encounter for gynecological examination (general) (routine) without abnormal findings: Secondary | ICD-10-CM | POA: Diagnosis not present

## 2017-04-08 ENCOUNTER — Encounter (HOSPITAL_COMMUNITY): Payer: Self-pay | Admitting: Emergency Medicine

## 2017-04-08 DIAGNOSIS — R112 Nausea with vomiting, unspecified: Secondary | ICD-10-CM | POA: Insufficient documentation

## 2017-04-08 DIAGNOSIS — R509 Fever, unspecified: Secondary | ICD-10-CM | POA: Insufficient documentation

## 2017-04-08 DIAGNOSIS — N10 Acute tubulo-interstitial nephritis: Secondary | ICD-10-CM | POA: Insufficient documentation

## 2017-04-08 DIAGNOSIS — N12 Tubulo-interstitial nephritis, not specified as acute or chronic: Secondary | ICD-10-CM | POA: Diagnosis not present

## 2017-04-08 DIAGNOSIS — Z79899 Other long term (current) drug therapy: Secondary | ICD-10-CM | POA: Diagnosis not present

## 2017-04-08 DIAGNOSIS — F17210 Nicotine dependence, cigarettes, uncomplicated: Secondary | ICD-10-CM | POA: Insufficient documentation

## 2017-04-08 DIAGNOSIS — Z87442 Personal history of urinary calculi: Secondary | ICD-10-CM | POA: Diagnosis not present

## 2017-04-08 DIAGNOSIS — R1032 Left lower quadrant pain: Secondary | ICD-10-CM | POA: Diagnosis not present

## 2017-04-08 LAB — URINALYSIS, ROUTINE W REFLEX MICROSCOPIC
BILIRUBIN URINE: NEGATIVE
Glucose, UA: NEGATIVE mg/dL
KETONES UR: NEGATIVE mg/dL
NITRITE: NEGATIVE
PH: 7 (ref 5.0–8.0)
Protein, ur: NEGATIVE mg/dL
SPECIFIC GRAVITY, URINE: 1.005 (ref 1.005–1.030)

## 2017-04-08 NOTE — ED Notes (Signed)
Patient requested for blood to be done when she gets to the back.

## 2017-04-08 NOTE — ED Triage Notes (Signed)
Patient states she has recurring uti's. Patient states she is currently on antibiotics. Patient states that her back is hurting and when this happens that she has a uti.

## 2017-04-09 ENCOUNTER — Emergency Department (HOSPITAL_COMMUNITY)
Admission: EM | Admit: 2017-04-09 | Discharge: 2017-04-09 | Disposition: A | Discharge status: Home / Self Care | Payer: BLUE CROSS/BLUE SHIELD | Attending: Emergency Medicine | Admitting: Emergency Medicine

## 2017-04-09 DIAGNOSIS — N12 Tubulo-interstitial nephritis, not specified as acute or chronic: Secondary | ICD-10-CM

## 2017-04-09 LAB — CBC WITH DIFFERENTIAL/PLATELET
Basophils Absolute: 0.1 10*3/uL (ref 0.0–0.1)
Basophils Relative: 1 %
EOS ABS: 0.1 10*3/uL (ref 0.0–0.7)
Eosinophils Relative: 2 %
HCT: 41.5 % (ref 36.0–46.0)
HEMOGLOBIN: 14.8 g/dL (ref 12.0–15.0)
Lymphocytes Relative: 47 %
Lymphs Abs: 3.1 10*3/uL (ref 0.7–4.0)
MCH: 32.2 pg (ref 26.0–34.0)
MCHC: 35.7 g/dL (ref 30.0–36.0)
MCV: 90.2 fL (ref 78.0–100.0)
MONOS PCT: 6 %
Monocytes Absolute: 0.4 10*3/uL (ref 0.1–1.0)
NEUTROS PCT: 44 %
Neutro Abs: 2.8 10*3/uL (ref 1.7–7.7)
PLATELETS: 238 10*3/uL (ref 150–400)
RBC: 4.6 MIL/uL (ref 3.87–5.11)
RDW: 11.7 % (ref 11.5–15.5)
WBC: 6.4 10*3/uL (ref 4.0–10.5)

## 2017-04-09 LAB — COMPREHENSIVE METABOLIC PANEL
ALBUMIN: 4.3 g/dL (ref 3.5–5.0)
ALK PHOS: 51 U/L (ref 38–126)
ALT: 12 U/L — ABNORMAL LOW (ref 14–54)
ANION GAP: 10 (ref 5–15)
AST: 23 U/L (ref 15–41)
BUN: 10 mg/dL (ref 6–20)
CALCIUM: 9.2 mg/dL (ref 8.9–10.3)
CHLORIDE: 106 mmol/L (ref 101–111)
CO2: 22 mmol/L (ref 22–32)
Creatinine, Ser: 0.83 mg/dL (ref 0.44–1.00)
GFR calc Af Amer: >60 mL/min (ref >60)
GFR calc non Af Amer: >60 mL/min (ref >60)
GLUCOSE: 98 mg/dL (ref 65–99)
POTASSIUM: 3.9 mmol/L (ref 3.5–5.1)
SODIUM: 138 mmol/L (ref 135–145)
Total Bilirubin: 1 mg/dL (ref 0.3–1.2)
Total Protein: 8.2 g/dL — ABNORMAL HIGH (ref 6.5–8.1)

## 2017-04-09 LAB — PREGNANCY, URINE: Preg Test, Ur: NEGATIVE

## 2017-04-09 MED ORDER — HYDROMORPHONE HCL 1 MG/ML IJ SOLN
1.0000 mg | Freq: Once | INTRAMUSCULAR | Status: AC
  Administered 2017-04-09: 1 mg via INTRAVENOUS
  Filled 2017-04-09: qty 1

## 2017-04-09 MED ORDER — ONDANSETRON HCL 4 MG/2ML IJ SOLN
4.0000 mg | Freq: Once | INTRAMUSCULAR | Status: AC
  Administered 2017-04-09: 4 mg via INTRAVENOUS
  Filled 2017-04-09: qty 2

## 2017-04-09 MED ORDER — DEXTROSE 5 % IV SOLN
1.0000 g | Freq: Once | INTRAVENOUS | Status: AC
  Administered 2017-04-09: 1 g via INTRAVENOUS
  Filled 2017-04-09: qty 10

## 2017-04-09 MED ORDER — CEPHALEXIN 500 MG PO CAPS: 500.0000 mg | ORAL_CAPSULE | Freq: Four times a day (QID) | ORAL | 0 refills | Status: DC

## 2017-04-09 MED ORDER — OXYCODONE-ACETAMINOPHEN 5-325 MG PO TABS: 2.0000 | ORAL_TABLET | ORAL | 0 refills | Status: DC | PRN

## 2017-04-09 MED ORDER — FENTANYL CITRATE (PF) 100 MCG/2ML IJ SOLN
100.0000 ug | Freq: Once | INTRAMUSCULAR | Status: AC
  Administered 2017-04-09: 100 ug via INTRAVENOUS
  Filled 2017-04-09: qty 2

## 2017-04-09 MED ORDER — ONDANSETRON 8 MG PO TBDP: 8.0000 mg | ORAL_TABLET | Freq: Three times a day (TID) | ORAL | 0 refills | Status: DC | PRN

## 2017-04-09 NOTE — ED Provider Notes (Signed)
[facility] DEPT Provider Note: Lowella DellJ. Lane Syretta Kochel, MD, FACEP  CSN: [id number] MRN: [id number] ARRIVAL: 04/08/17 at 2224 ROOM: WA10/WA10   CHIEF COMPLAINT  Urinary Tract Infection   HISTORY OF PRESENT ILLNESS  Tonya Davidson is a 26 y.o. female with a history of nephrolithiasis and recurrent urinary tract infections. She was seen by urology in June and placed on 10 days of Macrobid. She did not tolerate the Macrobid due to stomach upset and she was switched to daily trimethoprim 100 milligrams to control her recurrent UTIs. She is here with a 2 day history of fever to 101.3, left flank pain which she rates as a 7 out of 10, pain in her urethra after she voids, nausea, vomiting and general malaise. She denies urinary urgency or frequency. Symptoms are consistent with previous urinary tract infections. CT scan in December 2017 showed no renal or ureteral stones. A previous CT scan in January 2016 had shown a 3 millimeter nonobstructive renal stone which was no longer seen on the more recent study.   [facility] [place] [facility] reveals the patient has received one prescription for oxycodone in December of last year.   Past Medical History:  Diagnosis Date  . Abnormal Pap smear    colposcopy  . Anemia   . Anxiety   . Arthritis   . Bipolar 1 disorder (HCC)   . C. difficile diarrhea   . Chlamydia 04/29/2012  . Depression   . Fibromyalgia   . HPV in female   . IBS (irritable bowel syndrome)   . Kidney stones   . Mononucleosis   . Pelvic pain in female 12/14/2014  . PID (acute pelvic inflammatory disease) 04/28/2012  . Ulcer     Past Surgical History:  Procedure Laterality Date  . COLPOSCOPY    . INNER EAR SURGERY    . KIDNEY STONE SURGERY    . LAPAROSCOPIC CHOLECYSTECTOMY    . TONSILLECTOMY    . WISDOM TOOTH EXTRACTION      Family History  Problem Relation Age of Onset  . Bipolar disorder Mother   . Cancer Father   . ADD / ADHD  Brother   . Cancer Maternal Grandmother   . Cancer Maternal Grandfather   . Cancer Paternal Grandmother   . Cancer Paternal Grandfather     Social History  Substance Use Topics  . Smoking status: Current Every Day Smoker    Packs/day: 0.50    Years: 5.00    Types: Cigarettes  . Smokeless tobacco: Never Used  . Alcohol use No    Prior to Admission medications   Medication Sig Start Date End Date Taking? Authorizing Provider  DiphenhydrAMINE HCl, Sleep, (ZZZQUIL) 25 MG CAPS Take 50 mg by mouth at bedtime.    Yes [provider]  drospirenone-ethinyl estradiol (YAZ,GIANVI,LORYNA) 3-0.02 MG tablet Take 1 tablet by mouth daily.   Yes [provider]  Homeopathic Products (AZO YEAST PLUS PO) Take 1 tablet by mouth at bedtime.   Yes [provider]  ibuprofen (ADVIL,MOTRIN) 200 MG tablet Take 200 mg by mouth every 6 (six) hours as needed for moderate pain.   Yes [provider]  trimethoprim (TRIMPEX) 100 MG tablet Take 100 mg by mouth daily. 03/20/17  Yes [provider]    Allergies Azithromycin; Prednisone; Doxycycline hyclate; Flagyl [metronidazole]; Naproxen; Toradol [ketorolac tromethamine]; and Morphine and related   REVIEW OF SYSTEMS  Negative except as noted here or in the History of Present  Illness.   PHYSICAL EXAMINATION  Initial Vital Signs Blood pressure 106/70, pulse 85, temperature 98.5 F (36.9 C), temperature source Oral, resp. rate 18, height 5\' 4"  (1.626 m), weight 50.8 kg (112 lb), last menstrual period 03/25/2017, SpO2 100 %.  Examination General: Well-developed, well-nourished female in no acute distress; appearance consistent with age of record HENT: normocephalic; atraumatic Eyes: pupils equal, round and reactive to light; extraocular muscles intact Neck: supple Heart: regular rate and rhythm Lungs: clear to auscultation bilaterally Abdomen: soft; nondistended; suprapubic tenderness; no masses or  hepatosplenomegaly; bowel sounds present GU: Left CVA tenderness Extremities: No deformity; full range of motion; pulses normal Neurologic: Awake, alert and oriented; motor function intact in all extremities and symmetric; no facial droop Skin: Warm and dry Psychiatric: Pressured speech   RESULTS  Summary of this visit's results, reviewed by myself:   EKG Interpretation  Date/Time:    Ventricular Rate:    PR Interval:    QRS Duration:   QT Interval:    QTC Calculation:   R Axis:     Text Interpretation:        Laboratory Studies: Results for orders placed or performed during the hospital encounter of 04/09/17 (from the past 24 hour(s))  Urinalysis, Routine w reflex microscopic- may I&O cath if menses     Status: Abnormal   Collection Time: 04/08/17 10:53 PM  Result Value Ref Range   Color, Urine YELLOW YELLOW   APPearance HAZY (A) CLEAR   Specific Gravity, Urine 1.005 1.005 - 1.030   pH 7.0 5.0 - 8.0   Glucose, UA NEGATIVE NEGATIVE mg/dL   Hgb urine dipstick LARGE (A) NEGATIVE   Bilirubin Urine NEGATIVE NEGATIVE   Ketones, ur NEGATIVE NEGATIVE mg/dL   Protein, ur NEGATIVE NEGATIVE mg/dL   Nitrite NEGATIVE NEGATIVE   Leukocytes, UA LARGE (A) NEGATIVE   RBC / HPF 6-30 0 - 5 RBC/hpf   WBC, UA TOO NUMEROUS TO COUNT 0 - 5 WBC/hpf   Bacteria, UA RARE (A) NONE SEEN   Squamous Epithelial / LPF 0-5 (A) NONE SEEN   Mucous PRESENT   Pregnancy, urine     Status: None   Collection Time: 04/08/17 10:53 PM  Result Value Ref Range   Preg Test, Ur NEGATIVE NEGATIVE  CBC with Differential     Status: None   Collection Time: 04/09/17 12:36 AM  Result Value Ref Range   WBC 6.4 4.0 - 10.5 K/uL   RBC 4.60 3.87 - 5.11 MIL/uL   Hemoglobin 14.8 12.0 - 15.0 g/dL   HCT 74.2 59.5 - 63.8 %   MCV 90.2 78.0 - 100.0 fL   MCH 32.2 26.0 - 34.0 pg   MCHC 35.7 30.0 - 36.0 g/dL   RDW 75.6 43.3 - 29.5 %   Platelets 238 150 - 400 K/uL   Neutrophils Relative % 44 %   Neutro Abs 2.8 1.7 - 7.7  K/uL   Lymphocytes Relative 47 %   Lymphs Abs 3.1 0.7 - 4.0 K/uL   Monocytes Relative 6 %   Monocytes Absolute 0.4 0.1 - 1.0 K/uL   Eosinophils Relative 2 %   Eosinophils Absolute 0.1 0.0 - 0.7 K/uL   Basophils Relative 1 %   Basophils Absolute 0.1 0.0 - 0.1 K/uL  Comprehensive metabolic panel     Status: Abnormal   Collection Time: 04/09/17 12:36 AM  Result Value Ref Range   Sodium 138 135 - 145 mmol/L   Potassium 3.9 3.5 - 5.1 mmol/L   Chloride  106 101 - 111 mmol/L   CO2 22 22 - 32 mmol/L   Glucose, Bld 98 65 - 99 mg/dL   BUN 10 6 - 20 mg/dL   Creatinine, Ser 1.61 0.44 - 1.00 mg/dL   Calcium 9.2 8.9 - 09.6 mg/dL   Total Protein 8.2 (H) 6.5 - 8.1 g/dL   Albumin 4.3 3.5 - 5.0 g/dL   AST 23 15 - 41 U/L   ALT 12 (L) 14 - 54 U/L   Alkaline Phosphatase 51 38 - 126 U/L   Total Bilirubin 1.0 0.3 - 1.2 mg/dL   GFR calc non Af Amer >60 >60 mL/min   GFR calc Af Amer >60 >60 mL/min   Anion gap 10 5 - 15   Imaging Studies: No results found.  ED COURSE  Nursing notes and initial vitals signs, including pulse oximetry, reviewed.  Vitals:   04/08/17 2258 04/08/17 2300 04/09/17 0059 04/09/17 0257  BP:  107/71 106/70 110/70  Pulse:  92 85 84  Resp:  16 18 18   Temp:  98.5 F (36.9 C)    TempSrc:  Oral    SpO2:   100% 99%  Weight: 50.8 kg (112 lb)     Height: 5\' 4"  (1.626 m)      4:34 AM Patient feeling better after IV fluids, Rocephin and Dilaudid. She states she has received Keflex for kidney infections in the past. Will start on Keflex as we wish to avoid fluoroquinolones as first-line agents. She has follow-up planned with a urologist, Dr. Logan Bores, in [facility].  PROCEDURES    ED DIAGNOSES     ICD-10-CM   1. Pyelonephritis N12        Ralyn Stlaurent, MD 04/09/17 [phone]

## 2017-04-11 LAB — URINE CULTURE: Culture: 60000 — AB

## 2017-04-12 ENCOUNTER — Telehealth: Payer: Self-pay | Admitting: Emergency Medicine

## 2017-04-12 NOTE — Telephone Encounter (Signed)
Post ED Visit - Positive Culture Follow-up: Successful Patient Follow-Up  Culture assessed and recommendations reviewed by: []  Enzo BiNathan Batchelder, Pharm.D. []  Celedonio MiyamotoJeremy Frens, Pharm.D., BCPS AQ-ID [x]  Garvin FilaMike Maccia, Pharm.D., BCPS []  Georgina PillionElizabeth Martin, Pharm.D., BCPS []  Rock PointMinh Pham, [address] BoulevardPharm.D., BCPS, AAHIVP []  Estella HuskMichelle Turner, Pharm.D., BCPS, AAHIVP []  Lysle Pearlachel Rumbarger, PharmD, BCPS []  Casilda Carlsaylor Stone, PharmD, BCPS []  Pollyann SamplesAndy Johnston, PharmD, BCPS  Positive blood culture  []  Patient discharged without antimicrobial prescription and treatment is now indicated []  Organism is resistant to prescribed ED discharge antimicrobial [x]  Patient with positive blood cultures  Changes discussed with ED provider: McDonald PA Patient needs to return to ED for reeval, IV antibiotics  Attempting to contact patient   Berle MullMiller, Tacoya Altizer 04/12/2017, 1:07 PM

## 2017-04-12 NOTE — Progress Notes (Signed)
ED Antimicrobial Stewardship Positive Culture Follow Up   Tonya ApleyLindsay M Davidson is an 26 y.o. female who [facility] with a chief complaint of  Chief Complaint  Patient presents with  . Urinary Tract Infection    Recent Results (from the past 720 hour(s))  Urine culture     Status: Abnormal   Collection Time: 04/08/17 10:53 PM  Result Value Ref Range Status   Specimen Description URINE, RANDOM  Final   Special Requests NONE  Final   Culture (A)  Final    60,000 COLONIES/mL ESCHERICHIA COLI Confirmed Extended Spectrum Beta-Lactamase Producer (ESBL) Performed at [facility], [address] [place], [place] [address]    Report Status 04/11/2017 FINAL  Final   Organism ID, Bacteria ESCHERICHIA COLI (A)  Final      Susceptibility   Escherichia coli - MIC*    AMPICILLIN >=32 RESISTANT Resistant     CEFAZOLIN >=64 RESISTANT Resistant     CEFTRIAXONE >=64 RESISTANT Resistant     CIPROFLOXACIN >=4 RESISTANT Resistant     GENTAMICIN <=1 SENSITIVE Sensitive     IMIPENEM <=0.25 SENSITIVE Sensitive     NITROFURANTOIN <=16 SENSITIVE Sensitive     TRIMETH/SULFA >=320 RESISTANT Resistant     AMPICILLIN/SULBACTAM 16 INTERMEDIATE Intermediate     PIP/TAZO <=4 SENSITIVE Sensitive     Extended ESBL POSITIVE Resistant     * 60,000 COLONIES/mL ESCHERICHIA COLI   Patient should return to hospital for IV abx  ED Provider: Frederik PearMia McDonald, PA-C  Bertram MillardMichael A Cary Wilford 04/12/2017, 8:07 AM Infectious Diseases Pharmacist Phone# [phone]

## 2017-04-27 ENCOUNTER — Telehealth: Payer: Self-pay | Admitting: *Deleted

## 2017-04-27 NOTE — Telephone Encounter (Signed)
Contacted by patient in response to letter sent to address on file.  Advised to return to ED for IV antibiotics per Lilian KapurMcDonald, PA.  Verbalized understanding of instruction.

## 2017-04-28 ENCOUNTER — Emergency Department (HOSPITAL_COMMUNITY)
Admission: EM | Admit: 2017-04-28 | Discharge: 2017-04-28 | Disposition: A | Discharge status: Home / Self Care | Payer: BLUE CROSS/BLUE SHIELD | Attending: Emergency Medicine | Admitting: Emergency Medicine

## 2017-04-28 ENCOUNTER — Encounter (HOSPITAL_COMMUNITY): Payer: Self-pay

## 2017-04-28 DIAGNOSIS — F17210 Nicotine dependence, cigarettes, uncomplicated: Secondary | ICD-10-CM | POA: Insufficient documentation

## 2017-04-28 DIAGNOSIS — Z79899 Other long term (current) drug therapy: Secondary | ICD-10-CM | POA: Diagnosis not present

## 2017-04-28 DIAGNOSIS — N390 Urinary tract infection, site not specified: Secondary | ICD-10-CM

## 2017-04-28 DIAGNOSIS — R109 Unspecified abdominal pain: Secondary | ICD-10-CM | POA: Diagnosis not present

## 2017-04-28 LAB — COMPREHENSIVE METABOLIC PANEL
ALT: 13 U/L — AB (ref 14–54)
AST: 22 U/L (ref 15–41)
Albumin: 3.8 g/dL (ref 3.5–5.0)
Alkaline Phosphatase: 44 U/L (ref 38–126)
Anion gap: 9 (ref 5–15)
BILIRUBIN TOTAL: 0.6 mg/dL (ref 0.3–1.2)
BUN: 9 mg/dL (ref 6–20)
CO2: 23 mmol/L (ref 22–32)
CREATININE: 0.72 mg/dL (ref 0.44–1.00)
Calcium: 8.9 mg/dL (ref 8.9–10.3)
Chloride: 104 mmol/L (ref 101–111)
GFR calc Af Amer: >60 mL/min (ref >60)
Glucose, Bld: 106 mg/dL — ABNORMAL HIGH (ref 65–99)
Potassium: 3.8 mmol/L (ref 3.5–5.1)
Sodium: 136 mmol/L (ref 135–145)
TOTAL PROTEIN: 7.2 g/dL (ref 6.5–8.1)

## 2017-04-28 LAB — URINALYSIS, ROUTINE W REFLEX MICROSCOPIC
Bilirubin Urine: NEGATIVE
Glucose, UA: NEGATIVE mg/dL
Ketones, ur: NEGATIVE mg/dL
Nitrite: NEGATIVE
Protein, ur: NEGATIVE mg/dL
SPECIFIC GRAVITY, URINE: 1.013 (ref 1.005–1.030)
pH: 6 (ref 5.0–8.0)

## 2017-04-28 LAB — CBC
HCT: 34.8 % — ABNORMAL LOW (ref 36.0–46.0)
HEMOGLOBIN: 12.2 g/dL (ref 12.0–15.0)
MCH: 31.5 pg (ref 26.0–34.0)
MCHC: 35.1 g/dL (ref 30.0–36.0)
MCV: 89.9 fL (ref 78.0–100.0)
Platelets: 218 10*3/uL (ref 150–400)
RBC: 3.87 MIL/uL (ref 3.87–5.11)
RDW: 11.9 % (ref 11.5–15.5)
WBC: 5.6 10*3/uL (ref 4.0–10.5)

## 2017-04-28 LAB — LIPASE, BLOOD: LIPASE: 29 U/L (ref 11–51)

## 2017-04-28 MED ORDER — HYDROCODONE-ACETAMINOPHEN 5-325 MG PO TABS: 2.0000 | ORAL_TABLET | ORAL | 0 refills | Status: DC | PRN

## 2017-04-28 MED ORDER — ONDANSETRON 4 MG PO TBDP: 4.0000 mg | ORAL_TABLET | Freq: Three times a day (TID) | ORAL | 0 refills | Status: DC | PRN

## 2017-04-28 MED ORDER — OXYCODONE-ACETAMINOPHEN 5-325 MG PO TABS: 1.0000 | ORAL_TABLET | Freq: Four times a day (QID) | ORAL | 0 refills | Status: DC | PRN

## 2017-04-28 MED ORDER — HYDROMORPHONE HCL 1 MG/ML IJ SOLN
1.0000 mg | Freq: Once | INTRAMUSCULAR | Status: DC
  Filled 2017-04-28: qty 1

## 2017-04-28 MED ORDER — ONDANSETRON HCL 4 MG/2ML IJ SOLN
4.0000 mg | Freq: Once | INTRAMUSCULAR | Status: AC
  Administered 2017-04-28: 4 mg via INTRAVENOUS
  Filled 2017-04-28: qty 2

## 2017-04-28 MED ORDER — SODIUM CHLORIDE 0.9 % IV BOLUS (SEPSIS)
1000.0000 mL | Freq: Once | INTRAVENOUS | Status: AC
  Administered 2017-04-28: 1000 mL via INTRAVENOUS

## 2017-04-28 MED ORDER — NITROFURANTOIN MONOHYD MACRO 100 MG PO CAPS: 100.0000 mg | ORAL_CAPSULE | Freq: Two times a day (BID) | ORAL | 0 refills | Status: DC

## 2017-04-28 MED ORDER — HYDROMORPHONE HCL 1 MG/ML IJ SOLN
1.0000 mg | Freq: Once | INTRAMUSCULAR | Status: AC
  Administered 2017-04-28: 1 mg via INTRAVENOUS
  Filled 2017-04-28: qty 1

## 2017-04-28 MED ORDER — FENTANYL CITRATE (PF) 100 MCG/2ML IJ SOLN
50.0000 ug | Freq: Once | INTRAMUSCULAR | Status: AC
  Administered 2017-04-28: 50 ug via INTRAVENOUS
  Filled 2017-04-28: qty 2

## 2017-04-28 NOTE — Discharge Instructions (Signed)
Return to the ED with any concerns including vomiting and not able to keep down liquids, fever/chills, decreased level of alertness/lethargy, or any other alarming symptoms °

## 2017-04-28 NOTE — ED Provider Notes (Signed)
[facility]-EMERGENCY DEPT Provider Note   CSN: [id number] Arrival date & time: 04/28/17  4098     History   Chief Complaint Chief Complaint  Patient presents with  . Flank Pain    LEFT  . Abdominal Pain    LOWER    HPI Tonya Davidson is a 26 y.o. female.  HPI  Pt presenting with c/o left flank pain, burning after urination which has been ongoing since visit 7/22.  She states she took keflex as prescribed but this did not help, she went out of town and upon return last week started taking macrobid. She states that she continues to have flank pain, nausea, and pain after urination.  No fever.  No vomiting, but a lot of nausea.  No change in stools.  Per chart review her urine culture from 7/22 is resistant to keflex but is sensitive to macrobid.  There are no other associated systemic symptoms, there are no other alleviating or modifying factors.   Past Medical History:  Diagnosis Date  . Abnormal Pap smear    colposcopy  . Anemia   . Anxiety   . Arthritis   . Bipolar 1 disorder (HCC)   . C. difficile diarrhea   . Chlamydia 04/29/2012  . Depression   . Fibromyalgia   . HPV in female   . IBS (irritable bowel syndrome)   . Kidney stones   . Mononucleosis   . Pelvic pain in female 12/14/2014  . PID (acute pelvic inflammatory disease) 04/28/2012  . Ulcer     Patient Active Problem List   Diagnosis Date Noted  . Pyelonephritis 09/15/2016  . Right flank pain   . Pelvic pain in female 12/14/2014  . Major depressive disorder, recurrent episode, severe, without mention of psychotic behavior 10/03/2013  . GAD (generalized anxiety disorder) 09/30/2013  . PID (acute pelvic inflammatory disease) 04/28/2012  . ANXIETY 05/18/2009  . IBS 05/18/2009    Past Surgical History:  Procedure Laterality Date  . COLPOSCOPY    . INNER EAR SURGERY    . KIDNEY STONE SURGERY    . LAPAROSCOPIC CHOLECYSTECTOMY    . TONSILLECTOMY    . WISDOM TOOTH EXTRACTION      OB History    Gravida Para  Term Preterm AB Living   0             SAB TAB Ectopic Multiple Live Births                   Home Medications    Prior to Admission medications   Medication Sig Start Date End Date Taking? Authorizing Provider  DiphenhydrAMINE HCl, Sleep, (ZZZQUIL) 25 MG CAPS Take 50 mg by mouth at bedtime.    Yes [provider]  drospirenone-ethinyl estradiol (YAZ,GIANVI,LORYNA) 3-0.02 MG tablet Take 1 tablet by mouth daily.   Yes [provider]  ibuprofen (ADVIL,MOTRIN) 200 MG tablet Take 400 mg by mouth every 6 (six) hours as needed for moderate pain.    Yes [provider]  Lactobacillus (PROBIOTIC ACIDOPHILUS PO) Take 1 capsule by mouth daily.   Yes [provider]  Homeopathic Products (AZO YEAST PLUS PO) Take 1 tablet by mouth at bedtime.    [provider]  nitrofurantoin, macrocrystal-monohydrate, (MACROBID) 100 MG capsule Take 1 capsule (100 mg total) by mouth 2 (two) times daily. 04/28/17   Emmajane Altamura, Latanya Maudlin, MD  ondansetron (ZOFRAN ODT) 4 MG disintegrating tablet Take 1 tablet (4 mg total) by mouth every 8 (eight)  hours as needed for nausea or vomiting. 04/28/17   Davinci Glotfelty, Latanya Maudlin, MD  oxyCODONE-acetaminophen (PERCOCET/ROXICET) 5-325 MG tablet Take 1-2 tablets by mouth every 6 (six) hours as needed for severe pain. 04/28/17   Dael Howland, Latanya Maudlin, MD    Family History Family History  Problem Relation Age of Onset  . Bipolar disorder Mother   . Cancer Father   . ADD / ADHD Brother   . Cancer Maternal Grandmother   . Cancer Maternal Grandfather   . Cancer Paternal Grandmother   . Cancer Paternal Grandfather     Social History Social History  Substance Use Topics  . Smoking status: Current Every Day Smoker    Packs/day: 0.50    Years: 5.00    Types: Cigarettes  . Smokeless tobacco: Never Used  . Alcohol use No     Allergies   Azithromycin; Doxycycline; Prednisone; Flagyl [metronidazole]; Naproxen; Toradol [ketorolac tromethamine]; and Morphine  and related   Review of Systems Review of Systems  ROS reviewed and all otherwise negative except for mentioned in HPI   Physical Exam Updated Vital Signs BP 100/68 (BP Location: Left Arm)   Pulse 78   Temp 98.3 F (36.8 C) (Oral)   Resp 17   Ht 5\' 4"  (1.626 m)   Wt 49.4 kg (109 lb)   LMP 04/03/2017   SpO2 100%   BMI 18.71 kg/m  Vitals reivewed Physical Exam Physical Examination: General appearance - alert, well appearing, and in no distress Mental status - alert, oriented to person, place, and time Eyes -no conjunctival injection, no scleral icterus Mouth - mucous membranes moist, pharynx normal without lesions Neck - supple, no significant adenopathy Chest - clear to auscultation, no wheezes, rales or rhonchi, symmetric air entry Heart - normal rate, regular rhythm, normal S1, S2, no murmurs, rubs, clicks or gallops Abdomen - soft, nontender, nondistended, no masses or organomegaly Back- left CVA tenderness, no midline tenderness Neurological - alert, oriented, normal speech, no focal findings or movement disorder noted Musculoskeletal - no joint tenderness, deformity or swelling Extremities - peripheral pulses normal, no pedal edema, no clubbing or cyanosis Skin - normal coloration and turgor, no rashes  ED Treatments / Results  Labs (all labs ordered are listed, but only abnormal results are displayed) Labs Reviewed  URINALYSIS, ROUTINE W REFLEX MICROSCOPIC - Abnormal; Notable for the following:       Result Value   APPearance HAZY (*)    Hgb urine dipstick LARGE (*)    Leukocytes, UA TRACE (*)    Bacteria, UA RARE (*)    Squamous Epithelial / LPF 6-30 (*)    All other components within normal limits  CBC - Abnormal; Notable for the following:    HCT 34.8 (*)    All other components within normal limits  COMPREHENSIVE METABOLIC PANEL - Abnormal; Notable for the following:    Glucose, Bld 106 (*)    ALT 13 (*)    All other components within normal limits    URINE CULTURE  LIPASE, BLOOD    EKG  EKG Interpretation None       Radiology No results found.  Procedures Procedures (including critical care time)  Medications Ordered in ED Medications  HYDROmorphone (DILAUDID) injection 1 mg (1 mg Intravenous Not Given 04/28/17 1038)  sodium chloride 0.9 % bolus 1,000 mL (0 mLs Intravenous Stopped 04/28/17 1035)  ondansetron (ZOFRAN) injection 4 mg (4 mg Intravenous Given 04/28/17 0911)  fentaNYL (SUBLIMAZE) injection 50 mcg (50 mcg Intravenous Given  04/28/17 0911)  HYDROmorphone (DILAUDID) injection 1 mg (1 mg Intravenous Given 04/28/17 1001)     Initial Impression / Assessment and Plan / ED Course  I have reviewed the triage vital signs and the nursing notes.  Pertinent labs & imaging results that were available during my care of the patient were reviewed by me and considered in my medical decision making (see chart for details).     Pt presenting with c/o ongoing dysuria, left flank pain- she was notified to come back in due to being given rx for keflex and urine culture is not senstive to keflex. On workup today her urine seems to be improving, no elevation of WBC, renal function normal as well as vital signs.  Pt is able to tolerate po fluids after zofran.  She states fentanyl does not help her pain, requested more pain meds after dilaudid.  I had a long discussion with her about her results and need for urology followup.  I do not feel she needs admission at this time.  She has been taking macrobid and urine culture is sensitive to that only in terms of oral antibiotics.  Resent culture today as well.  Discharged with strict return precautions.  Pt agreeable with plan.  Final Clinical Impressions(s) / ED Diagnoses   Final diagnoses:  Urinary tract infection without hematuria, site unspecified    New Prescriptions Discharge Medication List as of 04/28/2017 10:36 AM    START taking these medications   Details  nitrofurantoin,  macrocrystal-monohydrate, (MACROBID) 100 MG capsule Take 1 capsule (100 mg total) by mouth 2 (two) times daily., Starting Sat 04/28/2017, Print    HYDROcodone-acetaminophen (NORCO/VICODIN) 5-325 MG tablet Take 2 tablets by mouth every 4 (four) hours as needed., Starting Sat 04/28/2017, Print         Lillianah Swartzentruber, Latanya MaudlinMartha L, MD 04/28/17 1331

## 2017-04-28 NOTE — ED Triage Notes (Signed)
PT C/O CONTINUED LEFT FLANK AND LOWER ABDOMINAL PAIN. PT STS SHE WAS SEEN HERE ON 7/25 AND GIVEN RX FOR KEFLEX, IN WHICH SHE FINISHED, BUT STS SHE IS STILL IN PAIN AFTER URINATING. PT STS SHE RECEIVED A LETTER IN THE MAIL TO CONTACT THIS FACILITY DUE OT THM NOT BEING ABLE TO REACH HER. SHE STS WHEN SHE CALLED, SHE WAS TOLD TO RETURN FOR FURTHER TREATMENT.

## 2017-04-29 LAB — URINE CULTURE

## 2017-05-15 DIAGNOSIS — N390 Urinary tract infection, site not specified: Secondary | ICD-10-CM | POA: Diagnosis not present

## 2017-05-15 DIAGNOSIS — R3989 Other symptoms and signs involving the genitourinary system: Secondary | ICD-10-CM | POA: Diagnosis not present

## 2017-05-15 DIAGNOSIS — N3010 Interstitial cystitis (chronic) without hematuria: Secondary | ICD-10-CM | POA: Diagnosis not present

## 2017-05-28 DIAGNOSIS — N390 Urinary tract infection, site not specified: Secondary | ICD-10-CM | POA: Diagnosis not present

## 2017-05-28 DIAGNOSIS — N3010 Interstitial cystitis (chronic) without hematuria: Secondary | ICD-10-CM | POA: Diagnosis not present

## 2017-05-29 DIAGNOSIS — J209 Acute bronchitis, unspecified: Secondary | ICD-10-CM | POA: Diagnosis not present

## 2017-05-29 DIAGNOSIS — R062 Wheezing: Secondary | ICD-10-CM | POA: Diagnosis not present

## 2017-05-30 DIAGNOSIS — K581 Irritable bowel syndrome with constipation: Secondary | ICD-10-CM | POA: Diagnosis not present

## 2017-05-30 DIAGNOSIS — F330 Major depressive disorder, recurrent, mild: Secondary | ICD-10-CM | POA: Diagnosis not present

## 2017-05-30 DIAGNOSIS — M797 Fibromyalgia: Secondary | ICD-10-CM | POA: Diagnosis not present

## 2017-05-30 DIAGNOSIS — F319 Bipolar disorder, unspecified: Secondary | ICD-10-CM | POA: Diagnosis not present

## 2017-06-07 DIAGNOSIS — R109 Unspecified abdominal pain: Secondary | ICD-10-CM | POA: Diagnosis not present

## 2017-06-07 DIAGNOSIS — G8929 Other chronic pain: Secondary | ICD-10-CM | POA: Diagnosis not present

## 2017-06-19 DIAGNOSIS — F419 Anxiety disorder, unspecified: Secondary | ICD-10-CM | POA: Diagnosis not present

## 2017-06-19 DIAGNOSIS — F321 Major depressive disorder, single episode, moderate: Secondary | ICD-10-CM | POA: Diagnosis not present

## 2017-06-19 DIAGNOSIS — R300 Dysuria: Secondary | ICD-10-CM | POA: Diagnosis not present

## 2017-06-19 DIAGNOSIS — G8929 Other chronic pain: Secondary | ICD-10-CM | POA: Diagnosis not present

## 2017-06-25 DIAGNOSIS — Z793 Long term (current) use of hormonal contraceptives: Secondary | ICD-10-CM | POA: Diagnosis not present

## 2017-06-25 DIAGNOSIS — Z888 Allergy status to other drugs, medicaments and biological substances status: Secondary | ICD-10-CM | POA: Diagnosis not present

## 2017-06-25 DIAGNOSIS — R1031 Right lower quadrant pain: Secondary | ICD-10-CM | POA: Diagnosis not present

## 2017-06-25 DIAGNOSIS — F17210 Nicotine dependence, cigarettes, uncomplicated: Secondary | ICD-10-CM | POA: Diagnosis not present

## 2017-06-25 DIAGNOSIS — Z79899 Other long term (current) drug therapy: Secondary | ICD-10-CM | POA: Diagnosis not present

## 2017-06-25 DIAGNOSIS — R109 Unspecified abdominal pain: Secondary | ICD-10-CM | POA: Diagnosis not present

## 2017-06-25 DIAGNOSIS — Z881 Allergy status to other antibiotic agents status: Secondary | ICD-10-CM | POA: Diagnosis not present

## 2017-06-25 DIAGNOSIS — Z886 Allergy status to analgesic agent status: Secondary | ICD-10-CM | POA: Diagnosis not present

## 2017-06-25 DIAGNOSIS — Z79891 Long term (current) use of opiate analgesic: Secondary | ICD-10-CM | POA: Diagnosis not present

## 2017-06-25 DIAGNOSIS — F319 Bipolar disorder, unspecified: Secondary | ICD-10-CM | POA: Diagnosis not present

## 2017-06-25 DIAGNOSIS — Z885 Allergy status to narcotic agent status: Secondary | ICD-10-CM | POA: Diagnosis not present

## 2017-06-25 DIAGNOSIS — R300 Dysuria: Secondary | ICD-10-CM | POA: Diagnosis not present

## 2017-06-25 DIAGNOSIS — M797 Fibromyalgia: Secondary | ICD-10-CM | POA: Diagnosis not present

## 2017-07-08 DIAGNOSIS — Z5320 Procedure and treatment not carried out because of patient's decision for unspecified reasons: Secondary | ICD-10-CM | POA: Diagnosis not present

## 2017-07-08 DIAGNOSIS — R1031 Right lower quadrant pain: Secondary | ICD-10-CM | POA: Diagnosis not present

## 2017-07-08 DIAGNOSIS — K5909 Other constipation: Secondary | ICD-10-CM | POA: Diagnosis not present

## 2017-07-08 DIAGNOSIS — Z3202 Encounter for pregnancy test, result negative: Secondary | ICD-10-CM | POA: Diagnosis not present

## 2017-07-19 DIAGNOSIS — F429 Obsessive-compulsive disorder, unspecified: Secondary | ICD-10-CM | POA: Diagnosis not present

## 2017-07-19 DIAGNOSIS — G8929 Other chronic pain: Secondary | ICD-10-CM | POA: Diagnosis not present

## 2017-07-19 DIAGNOSIS — N200 Calculus of kidney: Secondary | ICD-10-CM | POA: Diagnosis not present

## 2017-08-04 ENCOUNTER — Emergency Department (HOSPITAL_COMMUNITY): Payer: BLUE CROSS/BLUE SHIELD

## 2017-08-04 ENCOUNTER — Other Ambulatory Visit: Payer: Self-pay

## 2017-08-04 ENCOUNTER — Emergency Department (HOSPITAL_COMMUNITY)
Admission: EM | Admit: 2017-08-04 | Discharge: 2017-08-05 | Disposition: A | Discharge status: Home / Self Care | Payer: BLUE CROSS/BLUE SHIELD | Attending: Emergency Medicine | Admitting: Emergency Medicine

## 2017-08-04 ENCOUNTER — Encounter (HOSPITAL_COMMUNITY): Payer: Self-pay | Admitting: Emergency Medicine

## 2017-08-04 DIAGNOSIS — Z79899 Other long term (current) drug therapy: Secondary | ICD-10-CM | POA: Insufficient documentation

## 2017-08-04 DIAGNOSIS — R109 Unspecified abdominal pain: Secondary | ICD-10-CM | POA: Insufficient documentation

## 2017-08-04 DIAGNOSIS — F17210 Nicotine dependence, cigarettes, uncomplicated: Secondary | ICD-10-CM | POA: Diagnosis not present

## 2017-08-04 DIAGNOSIS — R319 Hematuria, unspecified: Secondary | ICD-10-CM | POA: Diagnosis not present

## 2017-08-04 LAB — URINALYSIS, ROUTINE W REFLEX MICROSCOPIC
Bacteria, UA: NONE SEEN
Bilirubin Urine: NEGATIVE
Glucose, UA: NEGATIVE mg/dL
Ketones, ur: NEGATIVE mg/dL
Nitrite: NEGATIVE
Protein, ur: NEGATIVE mg/dL
Specific Gravity, Urine: 1.014 (ref 1.005–1.030)
pH: 7 (ref 5.0–8.0)

## 2017-08-04 MED ORDER — FENTANYL CITRATE (PF) 100 MCG/2ML IJ SOLN
50.0000 ug | Freq: Once | INTRAMUSCULAR | Status: AC
  Administered 2017-08-05: 50 ug via INTRAVENOUS
  Filled 2017-08-04: qty 2

## 2017-08-04 MED ORDER — SODIUM CHLORIDE 0.9 % IV BOLUS (SEPSIS)
1000.0000 mL | Freq: Once | INTRAVENOUS | Status: AC
  Administered 2017-08-05: 1000 mL via INTRAVENOUS

## 2017-08-04 MED ORDER — KETOROLAC TROMETHAMINE 15 MG/ML IJ SOLN
15.0000 mg | Freq: Once | INTRAMUSCULAR | Status: AC
  Administered 2017-08-05: 15 mg via INTRAVENOUS
  Filled 2017-08-04: qty 1

## 2017-08-04 NOTE — ED Triage Notes (Signed)
Patient states she was seen a week and four days ago at her regular doctor. Patient states she was given an antibiotic and it is not working. Patient states she has blood in her urine.

## 2017-08-04 NOTE — ED Provider Notes (Signed)
[facility]-EMERGENCY DEPT Provider Note   CSN: [id number] Arrival date & time: 08/04/17  2153     History   Chief Complaint Chief Complaint  Patient presents with  . Hematuria  . Back Pain    HPI Tonya Davidson is a 26 y.o. female.  HPI  This is a 26 year old female with a history of kidney stones and PID who presents with left flank pain.  Patient reports left flank pain for the last 10 days.  She has seen her primary physician.  She gave a urine sample but is unsure what the results were.  She states that she was started on Macrobid and took all of her antibiotic.  She improved for some time but now the pain has worsened.  She reports 10 out of 10 left flank pain that is stabbing and nonradiating.  Consistent with her prior kidney stones.  She reports fevers up to 101.2 over the last several days.  She denies any dysuria but she reports decreased urine output and hematuria.  Denies any vomiting but does endorse nausea.  Denies abdominal pain, chest pain, shortness of breath.  Past Medical History:  Diagnosis Date  . Abnormal Pap smear    colposcopy  . Anemia   . Anxiety   . Arthritis   . Bipolar 1 disorder (HCC)   . C. difficile diarrhea   . Chlamydia 04/29/2012  . Depression   . Fibromyalgia   . HPV in female   . IBS (irritable bowel syndrome)   . Kidney stones   . Mononucleosis   . Pelvic pain in female 12/14/2014  . PID (acute pelvic inflammatory disease) 04/28/2012  . Ulcer     Patient Active Problem List   Diagnosis Date Noted  . Pyelonephritis 09/15/2016  . Right flank pain   . Pelvic pain in female 12/14/2014  . Major depressive disorder, recurrent episode, severe, without mention of psychotic behavior 10/03/2013  . GAD (generalized anxiety disorder) 09/30/2013  . PID (acute pelvic inflammatory disease) 04/28/2012  . ANXIETY 05/18/2009  . IBS 05/18/2009    Past Surgical History:  Procedure Laterality Date  . COLPOSCOPY    .  INNER EAR SURGERY    . KIDNEY STONE SURGERY    . LAPAROSCOPIC CHOLECYSTECTOMY    . TONSILLECTOMY    . WISDOM TOOTH EXTRACTION      OB History    Gravida Para Term Preterm AB Living   0             SAB TAB Ectopic Multiple Live Births                   Home Medications    Prior to Admission medications   Medication Sig Start Date End Date Taking? Authorizing Provider  cephALEXin (KEFLEX) 500 MG capsule Take 1 capsule (500 mg total) 3 (three) times daily by mouth. 08/05/17   Horton, Mayer Masker, MD  cyclobenzaprine (FLEXERIL) 5 MG tablet Take 1 tablet (5 mg total) 2 (two) times daily as needed by mouth for muscle spasms. 08/05/17   Horton, Mayer Masker, MD  DiphenhydrAMINE HCl, Sleep, (ZZZQUIL) 25 MG CAPS Take 50 mg by mouth at bedtime.     [provider]  drospirenone-ethinyl estradiol (YAZ,GIANVI,LORYNA) 3-0.02 MG tablet Take 1 tablet by mouth daily.    [provider]  Homeopathic Products (AZO YEAST PLUS PO) Take 1 tablet by mouth at bedtime.    [provider]  ibuprofen (ADVIL,MOTRIN) 200 MG tablet Take  400 mg by mouth every 6 (six) hours as needed for moderate pain.     [provider]  ibuprofen (ADVIL,MOTRIN) 600 MG tablet Take 1 tablet (600 mg total) every 6 (six) hours as needed by mouth. 08/05/17   Horton, Mayer Maskerourtney F, MD  Lactobacillus (PROBIOTIC ACIDOPHILUS PO) Take 1 capsule by mouth daily.    [provider]  nitrofurantoin, macrocrystal-monohydrate, (MACROBID) 100 MG capsule Take 1 capsule (100 mg total) by mouth 2 (two) times daily. 04/28/17   Mabe, Latanya MaudlinMartha L, MD  ondansetron (ZOFRAN ODT) 4 MG disintegrating tablet Take 1 tablet (4 mg total) by mouth every 8 (eight) hours as needed for nausea or vomiting. 04/28/17   Mabe, Latanya MaudlinMartha L, MD  oxyCODONE-acetaminophen (PERCOCET/ROXICET) 5-325 MG tablet Take 1-2 tablets by mouth every 6 (six) hours as needed for severe pain. 04/28/17   Mabe, Latanya MaudlinMartha L, MD    Family History Family History    Problem Relation Age of Onset  . Bipolar disorder Mother   . Cancer Father   . ADD / ADHD Brother   . Cancer Maternal Grandmother   . Cancer Maternal Grandfather   . Cancer Paternal Grandmother   . Cancer Paternal Grandfather     Social History Social History   Tobacco Use  . Smoking status: Current Every Day Smoker    Packs/day: 0.50    Years: 5.00    Pack years: 2.50    Types: Cigarettes  . Smokeless tobacco: Never Used  Substance Use Topics  . Alcohol use: No  . Drug use: Yes    Types: Marijuana    Comment: quit in June 2015 per patient      Allergies   Azithromycin; Doxycycline; Prednisone; Flagyl [metronidazole]; Naproxen; Toradol [ketorolac tromethamine]; and Morphine and related   Review of Systems Review of Systems  Constitutional: Positive for fever.  Respiratory: Negative for shortness of breath.   Cardiovascular: Negative for chest pain.  Gastrointestinal: Positive for nausea. Negative for abdominal pain and vomiting.  Genitourinary: Positive for difficulty urinating, flank pain and hematuria. Negative for dysuria.  All other systems reviewed and are negative.    Physical Exam Updated Vital Signs BP (!) 92/59 (BP Location: Left Arm)   Pulse 76   Temp 98.5 F (36.9 C) (Oral)   Resp 16   Ht 5\' 4"  (1.626 m)   Wt 49 kg (108 lb)   LMP 07/20/2017 Comment: HCG <5, 08/05/2017  SpO2 100%   BMI 18.54 kg/m   Physical Exam  Constitutional: She is oriented to person, place, and time. She appears well-developed and well-nourished. No distress.  HENT:  Head: Normocephalic and atraumatic.  Cardiovascular: Normal rate, regular rhythm and normal heart sounds.  Pulmonary/Chest: Effort normal and breath sounds normal. No respiratory distress.  Abdominal: Soft. She exhibits no mass. There is no tenderness.  Genitourinary:  Genitourinary Comments: Left CVA tenderness  Neurological: She is alert and oriented to person, place, and time.  Skin: Skin is warm and  dry.  Psychiatric: She has a normal mood and affect.  Nursing note and vitals reviewed.    ED Treatments / Results  Labs (all labs ordered are listed, but only abnormal results are displayed) Labs Reviewed  URINALYSIS, ROUTINE W REFLEX MICROSCOPIC - Abnormal; Notable for the following components:      Result Value   Hgb urine dipstick LARGE (*)    Leukocytes, UA SMALL (*)    Squamous Epithelial / LPF 0-5 (*)    All other components within normal limits  BASIC METABOLIC PANEL - Abnormal; Notable for the following components:   Glucose, Bld 117 (*)    Calcium 8.8 (*)    All other components within normal limits  URINE CULTURE  CBC WITH DIFFERENTIAL/PLATELET  I-STAT BETA HCG BLOOD, ED ([facility], [facility], [facility] ONLY)    EKG  EKG Interpretation None       Radiology Dg Abdomen 1 View  Result Date: 08/05/2017 CLINICAL DATA:  Acute onset of left flank pain and hematuria. EXAM: ABDOMEN - 1 VIEW COMPARISON:  CT of the abdomen and pelvis from 09/15/2016 FINDINGS: The visualized bowel gas pattern is unremarkable. Scattered air and stool filled loops of colon are seen; no abnormal dilatation of small bowel loops is seen to suggest small bowel obstruction. No free intra-abdominal air is identified, though evaluation for free air is limited on a single supine view. Clips are noted within the right upper quadrant, reflecting prior cholecystectomy. The visualized osseous structures are within normal limits; the sacroiliac joints are unremarkable in appearance. IMPRESSION: Unremarkable bowel gas pattern; no free intra-abdominal air seen. Small to moderate amount of stool noted in the colon. Electronically Signed   By: Roanna RaiderJeffery  Chang M.D.   On: 08/05/2017 00:32   [place] Renal  Result Date: 08/05/2017 CLINICAL DATA:  Left flank pain EXAM: RENAL / URINARY TRACT ULTRASOUND COMPLETE COMPARISON:  None. FINDINGS: Right Kidney: Length: 10 cm. Echogenicity within normal limits. No mass or hydronephrosis visualized. Left  Kidney: Length: 8.9 cm. Echogenicity within normal limits. No mass or hydronephrosis visualized. Bladder: Appears normal for degree of bladder distention. IMPRESSION: No sonographic findings for the patient's left flank pain. No obstructive uropathy or nephrolithiasis. Electronically Signed   By: Tollie Ethavid  Kwon M.D.   On: 08/05/2017 01:39   Ct Renal Stone Study  Result Date: 08/05/2017 CLINICAL DATA:  Subacute onset of left-sided abdominal pain. EXAM: CT ABDOMEN AND PELVIS WITHOUT CONTRAST TECHNIQUE: Multidetector CT imaging of the abdomen and pelvis was performed following the standard protocol without IV contrast. COMPARISON:  CT of the abdomen and pelvis from 09/15/2016, and renal ultrasound performed earlier today at 1:23 a.m. FINDINGS: Lower chest: The visualized lung bases are grossly clear. The visualized portions of the mediastinum are unremarkable. Hepatobiliary: The liver is unremarkable in appearance. The patient is status post cholecystectomy, with clips noted at the gallbladder fossa. The common bile duct remains normal in caliber. Pancreas: The pancreas is within normal limits. Spleen: The spleen is unremarkable in appearance. Adrenals/Urinary Tract: The adrenal glands are unremarkable in appearance. The kidneys are within normal limits. There is no evidence of hydronephrosis. No renal or ureteral stones are identified. No perinephric stranding is seen. Stomach/Bowel: The stomach is unremarkable in appearance. The small bowel is within normal limits. The appendix is normal in caliber, without evidence of appendicitis. The colon is unremarkable in appearance. Vascular/Lymphatic: The abdominal aorta is unremarkable in appearance. The inferior vena cava is grossly unremarkable. No retroperitoneal lymphadenopathy is seen. No pelvic sidewall lymphadenopathy is identified. Reproductive: The bladder is mildly distended. Mild bladder wall thickening could reflect cystitis, depending on the patient's  symptoms. No stones are identified within the bladder. The uterus is grossly unremarkable. The ovaries are relatively symmetric. No suspicious adnexal masses are seen. Other: Unremarkable bowel gas pattern; no free intra-abdominal air seen. Small amount of stool noted in the colon. Musculoskeletal: No acute osseous abnormalities are identified. The visualized musculature is unremarkable in appearance. IMPRESSION: 1. No evidence of hydronephrosis. No renal or ureteral stone seen at this time. 2.  Mild bladder wall thickening could reflect cystitis, depending on the patient's symptoms. Electronically Signed   By: Roanna Raider M.D.   On: 08/05/2017 02:31    Procedures Procedures (including critical care time)  Medications Ordered in ED Medications  sodium chloride 0.9 % bolus 1,000 mL (0 mLs Intravenous Stopped 08/05/17 0216)  ketorolac (TORADOL) 15 MG/ML injection 15 mg (15 mg Intravenous Given 08/05/17 0012)  fentaNYL (SUBLIMAZE) injection 50 mcg (50 mcg Intravenous Given 08/05/17 0032)  oxyCODONE-acetaminophen (PERCOCET/ROXICET) 5-325 MG per tablet 2 tablet (2 tablets Oral Given 08/05/17 0158)     Initial Impression / Assessment and Plan / ED Course  I have reviewed the triage vital signs and the nursing notes.  Pertinent labs & imaging results that were available during my care of the patient were reviewed by me and considered in my medical decision making (see chart for details).     Patient presents with left flank pain and urinary symptoms.  She is nontoxic on exam.  She does report at home.  Vital signs are reassuring.  Lab work notable for blood in the urine.  No obvious infection.  Initially KUB and ultrasound were obtained.  These are negative.  Patient on multiple occasions requesting Dilaudid.  Do not feel like this is clinically indicated.  She did she does have some thickening of the bladder wall.  Urinalysis appears negative.  Culture sent.  Given symptoms, will elect to treat  with antibiotics.  Recommend anti-inflammatories.  She was given a short course of muscle relaxant.  After history, exam, and medical workup I feel the patient has been appropriately medically screened and is safe for discharge home. Pertinent diagnoses were discussed with the patient. Patient was given return precautions.   Final Clinical Impressions(s) / ED Diagnoses   Final diagnoses:  Flank pain  Hematuria, unspecified type    ED Discharge Orders        Ordered    ibuprofen (ADVIL,MOTRIN) 600 MG tablet  Every 6 hours PRN     08/05/17 0246    cyclobenzaprine (FLEXERIL) 5 MG tablet  2 times daily PRN     08/05/17 0246    cephALEXin (KEFLEX) 500 MG capsule  3 times daily     08/05/17 0247       Horton, Mayer Masker, MD 08/05/17 [phone]

## 2017-08-04 NOTE — ED Notes (Signed)
ED Provider at bedside. 

## 2017-08-04 NOTE — ED Notes (Signed)
Attempted to draw labs, but EDP in room.

## 2017-08-05 ENCOUNTER — Emergency Department (HOSPITAL_COMMUNITY): Payer: BLUE CROSS/BLUE SHIELD

## 2017-08-05 DIAGNOSIS — R109 Unspecified abdominal pain: Secondary | ICD-10-CM | POA: Diagnosis not present

## 2017-08-05 LAB — CBC WITH DIFFERENTIAL/PLATELET
Basophils Absolute: 0.1 10*3/uL (ref 0.0–0.1)
Basophils Relative: 1 %
EOS ABS: 0.2 10*3/uL (ref 0.0–0.7)
Eosinophils Relative: 2 %
HEMATOCRIT: 36.6 % (ref 36.0–46.0)
HEMOGLOBIN: 12.5 g/dL (ref 12.0–15.0)
LYMPHS ABS: 2.9 10*3/uL (ref 0.7–4.0)
Lymphocytes Relative: 36 %
MCH: 31.6 pg (ref 26.0–34.0)
MCHC: 34.2 g/dL (ref 30.0–36.0)
MCV: 92.7 fL (ref 78.0–100.0)
MONOS PCT: 6 %
Monocytes Absolute: 0.4 10*3/uL (ref 0.1–1.0)
NEUTROS ABS: 4.4 10*3/uL (ref 1.7–7.7)
NEUTROS PCT: 55 %
Platelets: 295 10*3/uL (ref 150–400)
RBC: 3.95 MIL/uL (ref 3.87–5.11)
RDW: 12 % (ref 11.5–15.5)
WBC: 8 10*3/uL (ref 4.0–10.5)

## 2017-08-05 LAB — BASIC METABOLIC PANEL
Anion gap: 7 (ref 5–15)
BUN: 11 mg/dL (ref 6–20)
CHLORIDE: 104 mmol/L (ref 101–111)
CO2: 24 mmol/L (ref 22–32)
CREATININE: 0.68 mg/dL (ref 0.44–1.00)
Calcium: 8.8 mg/dL — ABNORMAL LOW (ref 8.9–10.3)
GFR calc Af Amer: >60 mL/min (ref >60)
GFR calc non Af Amer: >60 mL/min (ref >60)
GLUCOSE: 117 mg/dL — AB (ref 65–99)
Potassium: 3.6 mmol/L (ref 3.5–5.1)
Sodium: 135 mmol/L (ref 135–145)

## 2017-08-05 LAB — I-STAT BETA HCG BLOOD, ED (MC, WL, AP ONLY): I-stat hCG, quantitative: <5 m[IU]/mL (ref <5)

## 2017-08-05 MED ORDER — CEPHALEXIN 500 MG PO CAPS: 500.0000 mg | ORAL_CAPSULE | Freq: Three times a day (TID) | ORAL | 0 refills | Status: DC

## 2017-08-05 MED ORDER — IBUPROFEN 600 MG PO TABS: 600.0000 mg | ORAL_TABLET | Freq: Four times a day (QID) | ORAL | 0 refills | Status: DC | PRN

## 2017-08-05 MED ORDER — OXYCODONE-ACETAMINOPHEN 5-325 MG PO TABS
2.0000 | ORAL_TABLET | Freq: Once | ORAL | Status: AC
  Administered 2017-08-05: 2 via ORAL
  Filled 2017-08-05: qty 2

## 2017-08-05 MED ORDER — CYCLOBENZAPRINE HCL 5 MG PO TABS: 5.0000 mg | ORAL_TABLET | Freq: Two times a day (BID) | ORAL | 0 refills | Status: DC | PRN

## 2017-08-05 NOTE — Discharge Instructions (Signed)
You were seen today for flank pain and urinary symptoms.  Your workup is reassuring.  There is no evidence of pyelonephritis or kidney stone.  Your urine appears clean.  However, given your symptoms, will trial a course of antibiotics.  Follow-up with your primary physician.

## 2017-08-07 DIAGNOSIS — F419 Anxiety disorder, unspecified: Secondary | ICD-10-CM | POA: Diagnosis not present

## 2017-08-07 DIAGNOSIS — G8929 Other chronic pain: Secondary | ICD-10-CM | POA: Diagnosis not present

## 2017-08-07 DIAGNOSIS — M545 Low back pain: Secondary | ICD-10-CM | POA: Diagnosis not present

## 2017-08-07 LAB — URINE CULTURE

## 2017-08-08 ENCOUNTER — Telehealth: Payer: Self-pay | Admitting: *Deleted

## 2017-08-08 NOTE — Telephone Encounter (Signed)
Post ED Visit - Positive Culture Follow-up  Culture report reviewed by antimicrobial stewardship pharmacist:  []  Tonya Davidson, Pharm.D. []  Tonya Davidson, Pharm.D., BCPS AQ-ID []  Tonya Davidson, Pharm.D., BCPS []  Tonya Davidson, Pharm.D., BCPS []  Tonya Davidson, [address]Pharm[address]D., BCPS, AAHIVP []  Tonya Davidson, Pharm.D., BCPS, AAHIVP []  Tonya Davidson, PharmD, BCPS []  Tonya Davidson, PharmD, BCPS []  Tonya Davidson, PharmD, BCPS  Positive urine culture Treated with Cephalexin, organism sensitive to the same and no further patient follow-up is required at this time.  Tonya Davidson, Tonya Davidson [facility] 08/08/2017, 9:50 AM

## 2017-08-20 DIAGNOSIS — F419 Anxiety disorder, unspecified: Secondary | ICD-10-CM | POA: Diagnosis not present

## 2017-08-20 DIAGNOSIS — G4700 Insomnia, unspecified: Secondary | ICD-10-CM | POA: Diagnosis not present

## 2017-08-20 DIAGNOSIS — M797 Fibromyalgia: Secondary | ICD-10-CM | POA: Diagnosis not present

## 2017-09-14 DIAGNOSIS — M7918 Myalgia, other site: Secondary | ICD-10-CM | POA: Diagnosis not present

## 2017-09-14 DIAGNOSIS — Z79899 Other long term (current) drug therapy: Secondary | ICD-10-CM | POA: Diagnosis not present

## 2017-09-14 DIAGNOSIS — G894 Chronic pain syndrome: Secondary | ICD-10-CM | POA: Diagnosis not present

## 2017-09-14 DIAGNOSIS — Z5181 Encounter for therapeutic drug level monitoring: Secondary | ICD-10-CM | POA: Diagnosis not present

## 2017-09-20 DIAGNOSIS — F419 Anxiety disorder, unspecified: Secondary | ICD-10-CM | POA: Diagnosis not present

## 2017-09-20 DIAGNOSIS — F319 Bipolar disorder, unspecified: Secondary | ICD-10-CM | POA: Diagnosis not present

## 2017-09-20 DIAGNOSIS — M797 Fibromyalgia: Secondary | ICD-10-CM | POA: Diagnosis not present

## 2017-09-28 DIAGNOSIS — M7918 Myalgia, other site: Secondary | ICD-10-CM | POA: Diagnosis not present

## 2017-09-28 DIAGNOSIS — R1031 Right lower quadrant pain: Secondary | ICD-10-CM | POA: Diagnosis not present

## 2017-09-28 DIAGNOSIS — G894 Chronic pain syndrome: Secondary | ICD-10-CM | POA: Diagnosis not present

## 2017-10-11 DIAGNOSIS — R05 Cough: Secondary | ICD-10-CM | POA: Diagnosis not present

## 2017-10-11 DIAGNOSIS — G4700 Insomnia, unspecified: Secondary | ICD-10-CM | POA: Diagnosis not present

## 2017-10-11 DIAGNOSIS — R635 Abnormal weight gain: Secondary | ICD-10-CM | POA: Diagnosis not present

## 2017-10-18 DIAGNOSIS — G4700 Insomnia, unspecified: Secondary | ICD-10-CM | POA: Diagnosis not present

## 2017-10-18 DIAGNOSIS — R05 Cough: Secondary | ICD-10-CM | POA: Diagnosis not present

## 2017-10-18 DIAGNOSIS — R300 Dysuria: Secondary | ICD-10-CM | POA: Diagnosis not present

## 2017-10-19 DIAGNOSIS — G894 Chronic pain syndrome: Secondary | ICD-10-CM | POA: Diagnosis not present

## 2017-10-19 DIAGNOSIS — G5701 Lesion of sciatic nerve, right lower limb: Secondary | ICD-10-CM | POA: Diagnosis not present

## 2017-10-19 DIAGNOSIS — M7918 Myalgia, other site: Secondary | ICD-10-CM | POA: Diagnosis not present

## 2017-11-05 DIAGNOSIS — F319 Bipolar disorder, unspecified: Secondary | ICD-10-CM | POA: Diagnosis not present

## 2017-11-05 DIAGNOSIS — Z882 Allergy status to sulfonamides status: Secondary | ICD-10-CM | POA: Diagnosis not present

## 2017-11-05 DIAGNOSIS — R112 Nausea with vomiting, unspecified: Secondary | ICD-10-CM | POA: Diagnosis not present

## 2017-11-05 DIAGNOSIS — F17210 Nicotine dependence, cigarettes, uncomplicated: Secondary | ICD-10-CM | POA: Diagnosis not present

## 2017-11-05 DIAGNOSIS — Z9049 Acquired absence of other specified parts of digestive tract: Secondary | ICD-10-CM | POA: Diagnosis not present

## 2017-11-05 DIAGNOSIS — G4700 Insomnia, unspecified: Secondary | ICD-10-CM | POA: Diagnosis not present

## 2017-11-05 DIAGNOSIS — R9341 Abnormal radiologic findings on diagnostic imaging of renal pelvis, ureter, or bladder: Secondary | ICD-10-CM | POA: Diagnosis not present

## 2017-11-05 DIAGNOSIS — R630 Anorexia: Secondary | ICD-10-CM | POA: Diagnosis not present

## 2017-11-05 DIAGNOSIS — Z885 Allergy status to narcotic agent status: Secondary | ICD-10-CM | POA: Diagnosis not present

## 2017-11-05 DIAGNOSIS — R1011 Right upper quadrant pain: Secondary | ICD-10-CM | POA: Diagnosis not present

## 2017-11-05 DIAGNOSIS — Z793 Long term (current) use of hormonal contraceptives: Secondary | ICD-10-CM | POA: Diagnosis not present

## 2017-11-05 DIAGNOSIS — R110 Nausea: Secondary | ICD-10-CM | POA: Diagnosis not present

## 2017-11-05 DIAGNOSIS — Z888 Allergy status to other drugs, medicaments and biological substances status: Secondary | ICD-10-CM | POA: Diagnosis not present

## 2017-11-05 DIAGNOSIS — Z79899 Other long term (current) drug therapy: Secondary | ICD-10-CM | POA: Diagnosis not present

## 2017-11-06 DIAGNOSIS — M7918 Myalgia, other site: Secondary | ICD-10-CM | POA: Diagnosis not present

## 2017-11-06 DIAGNOSIS — G894 Chronic pain syndrome: Secondary | ICD-10-CM | POA: Diagnosis not present

## 2017-11-06 DIAGNOSIS — R1031 Right lower quadrant pain: Secondary | ICD-10-CM | POA: Diagnosis not present

## 2017-11-06 DIAGNOSIS — G5701 Lesion of sciatic nerve, right lower limb: Secondary | ICD-10-CM | POA: Diagnosis not present

## 2017-11-12 DIAGNOSIS — M7918 Myalgia, other site: Secondary | ICD-10-CM | POA: Diagnosis not present

## 2017-11-12 DIAGNOSIS — G5701 Lesion of sciatic nerve, right lower limb: Secondary | ICD-10-CM | POA: Diagnosis not present

## 2017-11-20 DIAGNOSIS — M797 Fibromyalgia: Secondary | ICD-10-CM | POA: Diagnosis not present

## 2017-11-20 DIAGNOSIS — F419 Anxiety disorder, unspecified: Secondary | ICD-10-CM | POA: Diagnosis not present

## 2017-11-20 DIAGNOSIS — F319 Bipolar disorder, unspecified: Secondary | ICD-10-CM | POA: Diagnosis not present

## 2017-11-20 DIAGNOSIS — R05 Cough: Secondary | ICD-10-CM | POA: Diagnosis not present

## 2017-11-23 DIAGNOSIS — G894 Chronic pain syndrome: Secondary | ICD-10-CM | POA: Diagnosis not present

## 2017-11-23 DIAGNOSIS — Z5181 Encounter for therapeutic drug level monitoring: Secondary | ICD-10-CM | POA: Diagnosis not present

## 2017-11-23 DIAGNOSIS — R1031 Right lower quadrant pain: Secondary | ICD-10-CM | POA: Diagnosis not present

## 2017-11-23 DIAGNOSIS — Z79899 Other long term (current) drug therapy: Secondary | ICD-10-CM | POA: Diagnosis not present

## 2017-11-23 DIAGNOSIS — G5701 Lesion of sciatic nerve, right lower limb: Secondary | ICD-10-CM | POA: Diagnosis not present

## 2017-11-23 DIAGNOSIS — M7918 Myalgia, other site: Secondary | ICD-10-CM | POA: Diagnosis not present

## 2017-12-03 DIAGNOSIS — M25552 Pain in left hip: Secondary | ICD-10-CM | POA: Diagnosis not present

## 2017-12-03 DIAGNOSIS — M25551 Pain in right hip: Secondary | ICD-10-CM | POA: Diagnosis not present

## 2017-12-03 DIAGNOSIS — M545 Low back pain: Secondary | ICD-10-CM | POA: Diagnosis not present

## 2017-12-06 DIAGNOSIS — M533 Sacrococcygeal disorders, not elsewhere classified: Secondary | ICD-10-CM | POA: Diagnosis not present

## 2017-12-06 DIAGNOSIS — G894 Chronic pain syndrome: Secondary | ICD-10-CM | POA: Diagnosis not present

## 2017-12-06 DIAGNOSIS — G5701 Lesion of sciatic nerve, right lower limb: Secondary | ICD-10-CM | POA: Diagnosis not present

## 2017-12-06 DIAGNOSIS — R1031 Right lower quadrant pain: Secondary | ICD-10-CM | POA: Diagnosis not present

## 2018-01-01 DIAGNOSIS — G5701 Lesion of sciatic nerve, right lower limb: Secondary | ICD-10-CM | POA: Diagnosis not present

## 2018-01-01 DIAGNOSIS — G894 Chronic pain syndrome: Secondary | ICD-10-CM | POA: Diagnosis not present

## 2018-01-01 DIAGNOSIS — Z5181 Encounter for therapeutic drug level monitoring: Secondary | ICD-10-CM | POA: Diagnosis not present

## 2018-01-01 DIAGNOSIS — R1031 Right lower quadrant pain: Secondary | ICD-10-CM | POA: Diagnosis not present

## 2018-01-01 DIAGNOSIS — Z79899 Other long term (current) drug therapy: Secondary | ICD-10-CM | POA: Diagnosis not present

## 2018-01-02 DIAGNOSIS — F3161 Bipolar disorder, current episode mixed, mild: Secondary | ICD-10-CM | POA: Diagnosis not present

## 2018-01-11 DIAGNOSIS — M5441 Lumbago with sciatica, right side: Secondary | ICD-10-CM | POA: Diagnosis not present

## 2018-01-14 ENCOUNTER — Emergency Department (HOSPITAL_COMMUNITY)
Admission: EM | Admit: 2018-01-14 | Discharge: 2018-01-14 | Disposition: A | Discharge status: Home / Self Care | Payer: BLUE CROSS/BLUE SHIELD | Attending: Emergency Medicine | Admitting: Emergency Medicine

## 2018-01-14 DIAGNOSIS — M549 Dorsalgia, unspecified: Secondary | ICD-10-CM | POA: Insufficient documentation

## 2018-01-14 DIAGNOSIS — R339 Retention of urine, unspecified: Secondary | ICD-10-CM | POA: Diagnosis not present

## 2018-01-14 DIAGNOSIS — Z5321 Procedure and treatment not carried out due to patient leaving prior to being seen by health care provider: Secondary | ICD-10-CM | POA: Diagnosis not present

## 2018-01-17 ENCOUNTER — Encounter (HOSPITAL_COMMUNITY): Payer: Self-pay | Admitting: Emergency Medicine

## 2018-01-17 ENCOUNTER — Emergency Department (HOSPITAL_COMMUNITY)
Admission: EM | Admit: 2018-01-17 | Discharge: 2018-01-18 | Discharge status: Against Medical Advice | Payer: BLUE CROSS/BLUE SHIELD | Attending: Emergency Medicine | Admitting: Emergency Medicine

## 2018-01-17 DIAGNOSIS — R109 Unspecified abdominal pain: Secondary | ICD-10-CM

## 2018-01-17 DIAGNOSIS — F17210 Nicotine dependence, cigarettes, uncomplicated: Secondary | ICD-10-CM | POA: Diagnosis not present

## 2018-01-17 DIAGNOSIS — Z79899 Other long term (current) drug therapy: Secondary | ICD-10-CM | POA: Insufficient documentation

## 2018-01-17 DIAGNOSIS — B3731 Acute candidiasis of vulva and vagina: Secondary | ICD-10-CM

## 2018-01-17 DIAGNOSIS — B373 Candidiasis of vulva and vagina: Secondary | ICD-10-CM | POA: Diagnosis not present

## 2018-01-17 LAB — URINALYSIS, ROUTINE W REFLEX MICROSCOPIC
Bacteria, UA: NONE SEEN
GLUCOSE, UA: NEGATIVE mg/dL
KETONES UR: NEGATIVE mg/dL
Nitrite: NEGATIVE
PROTEIN: NEGATIVE mg/dL
RBC / HPF: >50 RBC/hpf — ABNORMAL HIGH (ref 0–5)
Specific Gravity, Urine: 1.026 (ref 1.005–1.030)
pH: 5 (ref 5.0–8.0)

## 2018-01-17 NOTE — ED Triage Notes (Signed)
Pt reports lower abd pain X several days, went to her PCP who thought she might have UTI so they prescribed her Macrobid. Pt has seen no improvement in pain.   Pt also refusing blood work in triage.

## 2018-01-18 LAB — CBC
HEMATOCRIT: 40.3 % (ref 36.0–46.0)
HEMOGLOBIN: 13.5 g/dL (ref 12.0–15.0)
MCH: 32.1 pg (ref 26.0–34.0)
MCHC: 33.5 g/dL (ref 30.0–36.0)
MCV: 95.7 fL (ref 78.0–100.0)
Platelets: 311 10*3/uL (ref 150–400)
RBC: 4.21 MIL/uL (ref 3.87–5.11)
RDW: 12.2 % (ref 11.5–15.5)
WBC: 7.9 10*3/uL (ref 4.0–10.5)

## 2018-01-18 LAB — COMPREHENSIVE METABOLIC PANEL
ALT: 17 U/L (ref 14–54)
ANION GAP: 9 (ref 5–15)
AST: 25 U/L (ref 15–41)
Albumin: 3.7 g/dL (ref 3.5–5.0)
Alkaline Phosphatase: 94 U/L (ref 38–126)
BILIRUBIN TOTAL: 0.3 mg/dL (ref 0.3–1.2)
BUN: 8 mg/dL (ref 6–20)
CALCIUM: 8.8 mg/dL — AB (ref 8.9–10.3)
CO2: 23 mmol/L (ref 22–32)
Chloride: 105 mmol/L (ref 101–111)
Creatinine, Ser: 0.76 mg/dL (ref 0.44–1.00)
Glucose, Bld: 96 mg/dL (ref 65–99)
POTASSIUM: 3.6 mmol/L (ref 3.5–5.1)
Sodium: 137 mmol/L (ref 135–145)
Total Protein: 7.8 g/dL (ref 6.5–8.1)

## 2018-01-18 LAB — GC/CHLAMYDIA PROBE AMP (~~LOC~~) NOT AT ARMC
Chlamydia: NEGATIVE
Neisseria Gonorrhea: NEGATIVE

## 2018-01-18 LAB — WET PREP, GENITAL
Clue Cells Wet Prep HPF POC: NONE SEEN
SPERM: NONE SEEN
TRICH WET PREP: NONE SEEN

## 2018-01-18 LAB — LIPASE, BLOOD: Lipase: 36 U/L (ref 11–51)

## 2018-01-18 LAB — PREGNANCY, URINE: PREG TEST UR: NEGATIVE

## 2018-01-18 MED ORDER — KETOROLAC TROMETHAMINE 30 MG/ML IJ SOLN
30.0000 mg | Freq: Once | INTRAMUSCULAR | Status: DC
  Filled 2018-01-18: qty 1

## 2018-01-18 MED ORDER — ONDANSETRON HCL 4 MG/2ML IJ SOLN
4.0000 mg | Freq: Once | INTRAMUSCULAR | Status: AC
Start: 2018-01-18 — End: 2018-01-18
  Administered 2018-01-18: 4 mg via INTRAVENOUS
  Filled 2018-01-18: qty 2

## 2018-01-18 MED ORDER — MORPHINE SULFATE (PF) 4 MG/ML IV SOLN
4.0000 mg | Freq: Once | INTRAVENOUS | Status: AC
  Administered 2018-01-18: 4 mg via INTRAVENOUS
  Filled 2018-01-18: qty 1

## 2018-01-18 NOTE — ED Provider Notes (Signed)
Received patient at signout from [facility].  Refer to provider note for full history and physical examination.  Briefly, patient is a 27 year old female presenting for complaint of right flank pain and dysuria.  She has a history of pyelonephritis, kidney stones.  She did also have complaint of new vaginal discharge.  We are waiting results of pelvic exam and CT renal stone study.  If CT renal stone study shows no acute changes, plan to treat for PID and discussed with pharmacy due to patient's numerous medication allergies.  MDM   8:04 AM Patient wants to leave against medical advice.  She states "if you are not going to give me any pain medicine, I am going to leave here and blow my brains out ".  When I expressed concern regarding this statement she states "I was being facetious.  I just want to go home and sleep ".  She denies suicidal or homicidal ideation at this time.  She has been requesting Dilaudid.  I explained to her that Toradol is ineffective pain reliever for kidney stones and she states "I do not care I do not want that".  She is currently in pain management and her overdose risk score per the [place] controlled substance registry is 700.  She has not yet received her CT scan and I explained to her that I cannot in good conscience give her even more narcotic pain medicine as she has already had 4 mg of morphine IV without evaluating for acute pathology.  She refuses to stay for the scan and requests that RN remove the IV.  I explain to the patient that her work-up thus far is concerning for yeast infection and possible PID.  She refuses any treatment at this time and states she just wants to leave.  Patient understands that her actions will lead to inadequate medical workup, and that she is at risk of complications of missed diagnosis, which includes morbidity and mortality.   Alternative options discussed,  Opportunity to change mind given.  Discussion witnessed by patient  significant other and RN Tobi Bastos. Patient is demonstrating good capacity to make decision. Patient understands that she needs to return to the ER immediately if her symptoms get worse.         Jeanie Sewer, PA-C 01/18/18 0820    Zadie Rhine, MD 01/19/18 [phone]

## 2018-01-18 NOTE — ED Notes (Signed)
  Pt upset that she can not have Dilaudid. Pt is a pain management pt. PA informed and will speak with the pt.

## 2018-01-18 NOTE — ED Notes (Signed)
Pt. Moved to roonm, vital signs stable. Family @ bedside

## 2018-01-18 NOTE — ED Provider Notes (Signed)
[facility] EMERGENCY DEPARTMENT Provider Note   CSN: [id number] Arrival date & time: 01/17/18  2307     History   Chief Complaint Chief Complaint  Patient presents with  . Abdominal Pain    HPI Tonya Davidson is a 27 y.o. female.  Patient presents to the emergency department with a chief complaint of patient presents to the emergency department with a chief complaint of flank pain and dysuria.  She states the symptoms started yesterday evening.  She states that it feels like she has a kidney infection.  She does report a history of kidney stones.  She also reports new vaginal discharge.  She has had some associated nausea, but denies any vomiting.  She denies any fevers or chills.  She denies any other associated symptoms.  Her symptoms are worse immediately after urinating.  The history is provided by the patient. No language interpreter was used.    Past Medical History:  Diagnosis Date  . Abnormal Pap smear    colposcopy  . Anemia   . Anxiety   . Arthritis   . Bipolar 1 disorder (HCC)   . C. difficile diarrhea   . Chlamydia 04/29/2012  . Depression   . Fibromyalgia   . HPV in female   . IBS (irritable bowel syndrome)   . Kidney stones   . Mononucleosis   . Pelvic pain in female 12/14/2014  . PID (acute pelvic inflammatory disease) 04/28/2012  . Ulcer     Patient Active Problem List   Diagnosis Date Noted  . Pyelonephritis 09/15/2016  . Right flank pain   . Pelvic pain in female 12/14/2014  . Major depressive disorder, recurrent episode, severe, without mention of psychotic behavior 10/03/2013  . GAD (generalized anxiety disorder) 09/30/2013  . PID (acute pelvic inflammatory disease) 04/28/2012  . ANXIETY 05/18/2009  . IBS 05/18/2009    Past Surgical History:  Procedure Laterality Date  . COLPOSCOPY    . INNER EAR SURGERY    . KIDNEY STONE SURGERY    . LAPAROSCOPIC CHOLECYSTECTOMY    . TONSILLECTOMY    . WISDOM TOOTH EXTRACTION        OB History    Gravida  0   Para      Term      Preterm      AB      Living        SAB      TAB      Ectopic      Multiple      Live Births               Home Medications    Prior to Admission medications   Medication Sig Start Date End Date Taking? Authorizing Provider  ARIPiprazole (ABILIFY) 5 MG tablet Take 5 mg by mouth daily. 12/17/17  Yes [provider]  BELSOMRA 10 MG TABS Take 10 mg by mouth at bedtime. 01/16/18  Yes [provider]  buprenorphine (BUTRANS - DOSED MCG/HR) 20 MCG/HR PTWK patch Place 20 mcg onto the skin once a week.  12/14/17  Yes [provider]  clonazePAM (KLONOPIN) 0.5 MG tablet Take 0.5 mg by mouth 2 (two) times daily. 12/31/17  Yes [provider]  drospirenone-ethinyl estradiol (YAZ,GIANVI,LORYNA) 3-0.02 MG tablet Take 1 tablet by mouth daily.   Yes [provider]  mirtazapine (REMERON) 15 MG tablet Take 15 mg by mouth daily. 01/06/18  Yes [provider]  oxyCODONE-acetaminophen (PERCOCET) 10-325 MG  tablet Take 1 tablet by mouth every 6 (six) hours as needed for pain.  01/05/18 02/04/18 Yes [provider]  sertraline (ZOLOFT) 25 MG tablet Take 25 mg by mouth daily. 01/06/18  Yes [provider]  VRAYLAR capsule Take 3 mg by mouth daily.  01/11/18  Yes [provider]  cephALEXin (KEFLEX) 500 MG capsule Take 1 capsule (500 mg total) 3 (three) times daily by mouth. Patient not taking: Reported on 01/18/2018 08/05/17   Horton, Mayer Masker, MD  cyclobenzaprine (FLEXERIL) 5 MG tablet Take 1 tablet (5 mg total) 2 (two) times daily as needed by mouth for muscle spasms. Patient not taking: Reported on 01/18/2018 08/05/17   Horton, Mayer Masker, MD  ibuprofen (ADVIL,MOTRIN) 600 MG tablet Take 1 tablet (600 mg total) every 6 (six) hours as needed by mouth. Patient not taking: Reported on 01/18/2018 08/05/17   Horton, Mayer Masker, MD  nitrofurantoin, macrocrystal-monohydrate,  (MACROBID) 100 MG capsule Take 1 capsule (100 mg total) by mouth 2 (two) times daily. Patient not taking: Reported on 01/18/2018 04/28/17   Phillis Haggis, MD  ondansetron (ZOFRAN ODT) 4 MG disintegrating tablet Take 1 tablet (4 mg total) by mouth every 8 (eight) hours as needed for nausea or vomiting. Patient not taking: Reported on 01/18/2018 04/28/17   Phillis Haggis, MD  oxyCODONE-acetaminophen (PERCOCET/ROXICET) 5-325 MG tablet Take 1-2 tablets by mouth every 6 (six) hours as needed for severe pain. Patient not taking: Reported on 01/18/2018 04/28/17   Mabe, Latanya Maudlin, MD    Family History Family History  Problem Relation Age of Onset  . Bipolar disorder Mother   . Cancer Father   . ADD / ADHD Brother   . Cancer Maternal Grandmother   . Cancer Maternal Grandfather   . Cancer Paternal Grandmother   . Cancer Paternal Grandfather     Social History Social History   Tobacco Use  . Smoking status: Current Every Day Smoker    Packs/day: 0.50    Years: 5.00    Pack years: 2.50    Types: Cigarettes  . Smokeless tobacco: Never Used  Substance Use Topics  . Alcohol use: No  . Drug use: Yes    Types: Marijuana    Comment: quit in June 2015 per patient      Allergies   Azithromycin; Doxycycline; Prednisone; Flagyl [metronidazole]; Naproxen; Toradol [ketorolac tromethamine]; and Morphine and related   Review of Systems Review of Systems  All other systems reviewed and are negative.    Physical Exam Updated Vital Signs BP 106/81 (BP Location: Right Arm)   Pulse 94   Temp 98.4 F (36.9 C) (Oral)   Resp 18   Ht  (1.626 m)   Wt 63.5 kg (140 lb)   SpO2 99%   BMI 24.03 kg/m   Physical Exam  Constitutional: She is oriented to person, place, and time. She appears well-developed and well-nourished.  HENT:  Head: Normocephalic and atraumatic.  Eyes: Pupils are equal, round, and reactive to light. Conjunctivae and EOM are normal.  Neck: Normal range of motion. Neck supple.    Cardiovascular: Normal rate and regular rhythm. Exam reveals no gallop and no friction rub.  No murmur heard. Pulmonary/Chest: Effort normal and breath sounds normal. No respiratory distress. She has no wheezes. She has no rales. She exhibits no tenderness.  Abdominal: Soft. Bowel sounds are normal. She exhibits no distension and no mass. There is no tenderness. There is no rebound and no guarding.  Genitourinary:  Genitourinary Comments: Thick white vaginal discharge, no bleeding, right adnexal tenderness and uterine tenderness, no other abnormality  Musculoskeletal: Normal range of motion. She exhibits no edema or tenderness.  Neurological: She is alert and oriented to person, place, and time.  Skin: Skin is warm and dry.  Psychiatric: She has a normal mood and affect. Her behavior is normal. Judgment and thought content normal.  Nursing note and vitals reviewed.    ED Treatments / Results  Labs (all labs ordered are listed, but only abnormal results are displayed) Labs Reviewed  URINALYSIS, ROUTINE W REFLEX MICROSCOPIC - Abnormal; Notable for the following components:      Result Value   APPearance HAZY (*)    Hgb urine dipstick MODERATE (*)    Bilirubin Urine MODERATE (*)    Leukocytes, UA SMALL (*)    RBC / HPF >50 (*)    All other components within normal limits  WET PREP, GENITAL  LIPASE, BLOOD  COMPREHENSIVE METABOLIC PANEL  CBC  PREGNANCY, URINE  I-STAT BETA HCG BLOOD, ED ([facility], [facility], [facility] ONLY)  GC/CHLAMYDIA PROBE AMP () NOT AT [facility]    EKG None  Radiology No results found.  Procedures Procedures (including critical care time)  Medications Ordered in ED Medications  morphine 4 MG/ML injection 4 mg (has no administration in time range)  ondansetron (ZOFRAN) injection 4 mg (has no administration in time range)     Initial Impression / Assessment and Plan / ED Course  I have reviewed the triage vital signs and the nursing notes.  Pertinent labs &  imaging results that were available during my care of the patient were reviewed by me and considered in my medical decision making (see chart for details).     Patient with hematuria, flank pain, and dysuria.  No evidence of infection in urine, however there is concern for kidney stone.  Will check CT renal study.  Patient also has a fair amount of vaginal discharge.  We will treat this accordingly, swabs are pending.  Patient signed out to [place], [place].  Final Clinical Impressions(s) / ED Diagnoses   Final diagnoses:  None    ED Discharge Orders    None       Roxy Horseman, PA-C 01/18/18 1610

## 2018-01-21 DIAGNOSIS — M549 Dorsalgia, unspecified: Secondary | ICD-10-CM | POA: Diagnosis not present

## 2018-01-28 DIAGNOSIS — B373 Candidiasis of vulva and vagina: Secondary | ICD-10-CM | POA: Diagnosis not present

## 2018-01-28 DIAGNOSIS — F319 Bipolar disorder, unspecified: Secondary | ICD-10-CM | POA: Diagnosis not present

## 2018-01-28 DIAGNOSIS — G8929 Other chronic pain: Secondary | ICD-10-CM | POA: Diagnosis not present

## 2018-01-28 DIAGNOSIS — M797 Fibromyalgia: Secondary | ICD-10-CM | POA: Diagnosis not present

## 2018-02-27 DIAGNOSIS — M797 Fibromyalgia: Secondary | ICD-10-CM | POA: Diagnosis not present

## 2018-02-27 DIAGNOSIS — N390 Urinary tract infection, site not specified: Secondary | ICD-10-CM | POA: Diagnosis not present

## 2018-02-27 DIAGNOSIS — R300 Dysuria: Secondary | ICD-10-CM | POA: Diagnosis not present

## 2018-02-27 DIAGNOSIS — F319 Bipolar disorder, unspecified: Secondary | ICD-10-CM | POA: Diagnosis not present

## 2018-02-27 DIAGNOSIS — M545 Low back pain: Secondary | ICD-10-CM | POA: Diagnosis not present

## 2018-03-06 DIAGNOSIS — M5441 Lumbago with sciatica, right side: Secondary | ICD-10-CM | POA: Diagnosis not present

## 2018-03-06 DIAGNOSIS — G8929 Other chronic pain: Secondary | ICD-10-CM | POA: Diagnosis not present

## 2018-03-06 DIAGNOSIS — M5442 Lumbago with sciatica, left side: Secondary | ICD-10-CM | POA: Diagnosis not present

## 2018-03-06 DIAGNOSIS — M549 Dorsalgia, unspecified: Secondary | ICD-10-CM | POA: Diagnosis not present

## 2018-04-02 ENCOUNTER — Other Ambulatory Visit: Payer: Self-pay | Admitting: Physician Assistant

## 2018-04-02 DIAGNOSIS — M5442 Lumbago with sciatica, left side: Principal | ICD-10-CM

## 2018-04-02 DIAGNOSIS — M5441 Lumbago with sciatica, right side: Principal | ICD-10-CM

## 2018-04-02 DIAGNOSIS — G8929 Other chronic pain: Secondary | ICD-10-CM

## 2018-04-07 ENCOUNTER — Ambulatory Visit
Admission: RE | Admit: 2018-04-07 | Discharge: 2018-04-07 | Disposition: A | Discharge status: Home / Self Care | Payer: BLUE CROSS/BLUE SHIELD | Source: Ambulatory Visit | Attending: Physician Assistant | Admitting: Physician Assistant

## 2018-04-07 DIAGNOSIS — M5442 Lumbago with sciatica, left side: Principal | ICD-10-CM

## 2018-04-07 DIAGNOSIS — G8929 Other chronic pain: Secondary | ICD-10-CM

## 2018-04-07 DIAGNOSIS — M5441 Lumbago with sciatica, right side: Principal | ICD-10-CM

## 2018-04-07 DIAGNOSIS — M545 Low back pain: Secondary | ICD-10-CM | POA: Diagnosis not present

## 2018-04-11 DIAGNOSIS — M25551 Pain in right hip: Secondary | ICD-10-CM | POA: Diagnosis not present

## 2018-04-11 DIAGNOSIS — M25552 Pain in left hip: Secondary | ICD-10-CM | POA: Diagnosis not present

## 2018-04-17 DIAGNOSIS — M797 Fibromyalgia: Secondary | ICD-10-CM | POA: Diagnosis not present

## 2018-04-17 DIAGNOSIS — L0390 Cellulitis, unspecified: Secondary | ICD-10-CM | POA: Diagnosis not present

## 2018-04-17 DIAGNOSIS — M545 Low back pain: Secondary | ICD-10-CM | POA: Diagnosis not present

## 2018-04-18 ENCOUNTER — Other Ambulatory Visit: Payer: Self-pay | Admitting: Physician Assistant

## 2018-04-18 DIAGNOSIS — M25551 Pain in right hip: Secondary | ICD-10-CM

## 2018-04-18 DIAGNOSIS — M25552 Pain in left hip: Principal | ICD-10-CM

## 2018-04-27 ENCOUNTER — Other Ambulatory Visit: Payer: Self-pay

## 2018-04-27 ENCOUNTER — Inpatient Hospital Stay: Admission: RE | Admit: 2018-04-27 | Payer: Self-pay | Source: Ambulatory Visit

## 2018-05-16 DIAGNOSIS — F1120 Opioid dependence, uncomplicated: Secondary | ICD-10-CM | POA: Diagnosis not present

## 2018-05-16 DIAGNOSIS — Z79899 Other long term (current) drug therapy: Secondary | ICD-10-CM | POA: Diagnosis not present

## 2018-05-16 DIAGNOSIS — R350 Frequency of micturition: Secondary | ICD-10-CM | POA: Diagnosis not present

## 2018-05-16 DIAGNOSIS — N390 Urinary tract infection, site not specified: Secondary | ICD-10-CM | POA: Diagnosis not present

## 2018-05-16 DIAGNOSIS — R05 Cough: Secondary | ICD-10-CM | POA: Diagnosis not present

## 2018-05-27 DIAGNOSIS — F17210 Nicotine dependence, cigarettes, uncomplicated: Secondary | ICD-10-CM | POA: Diagnosis not present

## 2018-05-27 DIAGNOSIS — R109 Unspecified abdominal pain: Secondary | ICD-10-CM | POA: Diagnosis not present

## 2018-05-27 DIAGNOSIS — N3001 Acute cystitis with hematuria: Secondary | ICD-10-CM | POA: Insufficient documentation

## 2018-05-27 DIAGNOSIS — Z79899 Other long term (current) drug therapy: Secondary | ICD-10-CM | POA: Diagnosis not present

## 2018-05-27 DIAGNOSIS — N390 Urinary tract infection, site not specified: Secondary | ICD-10-CM | POA: Diagnosis not present

## 2018-05-28 ENCOUNTER — Emergency Department (HOSPITAL_COMMUNITY): Payer: BLUE CROSS/BLUE SHIELD

## 2018-05-28 ENCOUNTER — Other Ambulatory Visit: Payer: Self-pay

## 2018-05-28 ENCOUNTER — Encounter (HOSPITAL_COMMUNITY): Payer: Self-pay | Admitting: *Deleted

## 2018-05-28 ENCOUNTER — Emergency Department (HOSPITAL_COMMUNITY)
Admission: EM | Admit: 2018-05-28 | Discharge: 2018-05-28 | Disposition: A | Discharge status: Home / Self Care | Payer: BLUE CROSS/BLUE SHIELD | Attending: Emergency Medicine | Admitting: Emergency Medicine

## 2018-05-28 DIAGNOSIS — N3001 Acute cystitis with hematuria: Secondary | ICD-10-CM

## 2018-05-28 DIAGNOSIS — N390 Urinary tract infection, site not specified: Secondary | ICD-10-CM | POA: Diagnosis not present

## 2018-05-28 LAB — COMPREHENSIVE METABOLIC PANEL
ALK PHOS: 92 U/L (ref 38–126)
ALT: 16 U/L (ref 0–44)
AST: 21 U/L (ref 15–41)
Albumin: 4.3 g/dL (ref 3.5–5.0)
Anion gap: 11 (ref 5–15)
BILIRUBIN TOTAL: 0.4 mg/dL (ref 0.3–1.2)
BUN: 10 mg/dL (ref 6–20)
CALCIUM: 9.5 mg/dL (ref 8.9–10.3)
CO2: 23 mmol/L (ref 22–32)
CREATININE: 0.72 mg/dL (ref 0.44–1.00)
Chloride: 106 mmol/L (ref 98–111)
GFR calc Af Amer: >60 mL/min (ref >60)
GFR calc non Af Amer: >60 mL/min (ref >60)
Glucose, Bld: 84 mg/dL (ref 70–99)
POTASSIUM: 3.7 mmol/L (ref 3.5–5.1)
Sodium: 140 mmol/L (ref 135–145)
TOTAL PROTEIN: 8.8 g/dL — AB (ref 6.5–8.1)

## 2018-05-28 LAB — PREGNANCY, URINE: PREG TEST UR: NEGATIVE

## 2018-05-28 LAB — URINALYSIS, ROUTINE W REFLEX MICROSCOPIC
BILIRUBIN URINE: NEGATIVE
Glucose, UA: NEGATIVE mg/dL
Hgb urine dipstick: NEGATIVE
KETONES UR: NEGATIVE mg/dL
Nitrite: NEGATIVE
PROTEIN: NEGATIVE mg/dL
Specific Gravity, Urine: 1.018 (ref 1.005–1.030)
pH: 6 (ref 5.0–8.0)

## 2018-05-28 LAB — LIPASE, BLOOD: Lipase: 50 U/L (ref 11–51)

## 2018-05-28 LAB — CBC
HEMATOCRIT: 40.2 % (ref 36.0–46.0)
HEMOGLOBIN: 13.9 g/dL (ref 12.0–15.0)
MCH: 31.9 pg (ref 26.0–34.0)
MCHC: 34.6 g/dL (ref 30.0–36.0)
MCV: 92.2 fL (ref 78.0–100.0)
Platelets: 420 10*3/uL — ABNORMAL HIGH (ref 150–400)
RBC: 4.36 MIL/uL (ref 3.87–5.11)
RDW: 12.1 % (ref 11.5–15.5)
WBC: 9.5 10*3/uL (ref 4.0–10.5)

## 2018-05-28 MED ORDER — CEPHALEXIN 500 MG PO CAPS: 500.0000 mg | ORAL_CAPSULE | Freq: Two times a day (BID) | ORAL | 0 refills | Status: AC

## 2018-05-28 MED ORDER — MORPHINE SULFATE (PF) 4 MG/ML IV SOLN
4.0000 mg | Freq: Once | INTRAVENOUS | Status: AC
  Administered 2018-05-28: 4 mg via INTRAVENOUS
  Filled 2018-05-28: qty 1

## 2018-05-28 MED ORDER — KETOROLAC TROMETHAMINE 30 MG/ML IJ SOLN: 30.0000 mg | Freq: Once | INTRAMUSCULAR | Status: DC

## 2018-05-28 MED ORDER — ACETAMINOPHEN 500 MG PO TABS
1000.0000 mg | ORAL_TABLET | Freq: Once | ORAL | Status: DC
  Filled 2018-05-28: qty 2

## 2018-05-28 MED ORDER — ONDANSETRON HCL 4 MG/2ML IJ SOLN
4.0000 mg | Freq: Once | INTRAMUSCULAR | Status: AC
  Administered 2018-05-28: 4 mg via INTRAVENOUS
  Filled 2018-05-28: qty 2

## 2018-05-28 MED ORDER — SODIUM CHLORIDE 0.9 % IV SOLN
1.0000 g | Freq: Once | INTRAVENOUS | Status: AC
  Administered 2018-05-28: 1 g via INTRAVENOUS
  Filled 2018-05-28: qty 10

## 2018-05-28 MED ORDER — FENTANYL CITRATE (PF) 100 MCG/2ML IJ SOLN: 50.0000 ug | Freq: Once | INTRAMUSCULAR | Status: DC

## 2018-05-28 MED ORDER — SODIUM CHLORIDE 0.9 % IV BOLUS
1000.0000 mL | Freq: Once | INTRAVENOUS | Status: AC
  Administered 2018-05-28: 1000 mL via INTRAVENOUS

## 2018-05-28 NOTE — ED Provider Notes (Signed)
[facility]-EMERGENCY DEPT Provider Note   CSN: [id number] Arrival date & time: 05/27/18  2342     History   Chief Complaint Chief Complaint  Patient presents with  . Flank Pain    HPI Tonya Davidson is a 27 y.o. female with PMH/o anemia, Bipolar, Fibromyalgia, Kidney stone, PID who presents for evaluation of dysuria, hematuria, subjective fever/chills, nausea.  Patient reports that approximately 2 weeks ago, she was diagnosed with a urinary tract infection was started on Bactrim.  Patient states that she only took 2 days worth of Bactrim before stopping.  She states she was having a reaction and felt like she could not take it anymore.  She never completed the course of antibiotics.  Patient reports that over the last 5 days, she has still continued to have dysuria.  Additionally, she states she started feeling worse over the last 2 to 3 days.  She states she has had hematuria, subjective fever chills, nausea and diarrhea.  She also reports that today, she started having some right flank pain that radiates to the right side.  Patient reports that she took Tylenol for her pain with minimal improvement.  Patient reports she has not had any vomiting.  The history is provided by the patient.    Past Medical History:  Diagnosis Date  . Abnormal Pap smear    colposcopy  . Anemia   . Anxiety   . Arthritis   . Bipolar 1 disorder (HCC)   . C. difficile diarrhea   . Chlamydia 04/29/2012  . Depression   . Fibromyalgia   . HPV in female   . IBS (irritable bowel syndrome)   . Kidney stones   . Mononucleosis   . Pelvic pain in female 12/14/2014  . PID (acute pelvic inflammatory disease) 04/28/2012  . Ulcer     Patient Active Problem List   Diagnosis Date Noted  . Pyelonephritis 09/15/2016  . Right flank pain   . Pelvic pain in female 12/14/2014  . Major depressive disorder, recurrent episode, severe, without mention of psychotic behavior 10/03/2013  . GAD  (generalized anxiety disorder) 09/30/2013  . PID (acute pelvic inflammatory disease) 04/28/2012  . ANXIETY 05/18/2009  . IBS 05/18/2009    Past Surgical History:  Procedure Laterality Date  . COLPOSCOPY    . INNER EAR SURGERY    . KIDNEY STONE SURGERY    . LAPAROSCOPIC CHOLECYSTECTOMY    . TONSILLECTOMY    . WISDOM TOOTH EXTRACTION       OB History    Gravida  0   Para      Term      Preterm      AB      Living        SAB      TAB      Ectopic      Multiple      Live Births               Home Medications    Prior to Admission medications   Medication Sig Start Date End Date Taking? Authorizing Provider  acetaminophen (TYLENOL) 500 MG tablet Take 1,500 mg by mouth every 6 (six) hours as needed for moderate pain.   Yes [provider]  ARIPiprazole (ABILIFY) 2 MG tablet Take 1 mg by mouth daily.   Yes [provider]  BELSOMRA 10 MG TABS Take 10 mg by mouth at bedtime. 01/16/18  Yes [provider]  clonazePAM Scarlette Calico)  0.5 MG tablet Take 0.5 mg by mouth 2 (two) times daily. 12/31/17  Yes [provider]  drospirenone-ethinyl estradiol (YAZ,GIANVI,LORYNA) 3-0.02 MG tablet Take 1 tablet by mouth daily.   Yes [provider]  Phenyleph-Doxylamine-DM-APAP (NYQUIL SEVERE COLD/FLU) 5-6.25-10-325 MG CAPS Take 2 capsules by mouth at bedtime.   Yes [provider]  sertraline (ZOLOFT) 25 MG tablet Take 12.5 mg by mouth daily.  01/06/18  Yes [provider]  cephALEXin (KEFLEX) 500 MG capsule Take 1 capsule (500 mg total) by mouth 2 (two) times daily for 7 days. 05/28/18 06/04/18  Maxwell Caul, PA-C  cyclobenzaprine (FLEXERIL) 5 MG tablet Take 1 tablet (5 mg total) 2 (two) times daily as needed by mouth for muscle spasms. Patient not taking: Reported on 01/18/2018 08/05/17   Horton, Mayer Masker, MD  ibuprofen (ADVIL,MOTRIN) 600 MG tablet Take 1 tablet (600 mg total) every 6 (six) hours as needed by  mouth. Patient not taking: Reported on 01/18/2018 08/05/17   Horton, Mayer Masker, MD  nitrofurantoin, macrocrystal-monohydrate, (MACROBID) 100 MG capsule Take 1 capsule (100 mg total) by mouth 2 (two) times daily. Patient not taking: Reported on 01/18/2018 04/28/17   Phillis Haggis, MD  ondansetron (ZOFRAN ODT) 4 MG disintegrating tablet Take 1 tablet (4 mg total) by mouth every 8 (eight) hours as needed for nausea or vomiting. Patient not taking: Reported on 01/18/2018 04/28/17   Phillis Haggis, MD  oxyCODONE-acetaminophen (PERCOCET/ROXICET) 5-325 MG tablet Take 1-2 tablets by mouth every 6 (six) hours as needed for severe pain. Patient not taking: Reported on 01/18/2018 04/28/17   Mabe, Latanya Maudlin, MD    Family History Family History  Problem Relation Age of Onset  . Bipolar disorder Mother   . Cancer Father   . ADD / ADHD Brother   . Cancer Maternal Grandmother   . Cancer Maternal Grandfather   . Cancer Paternal Grandmother   . Cancer Paternal Grandfather     Social History Social History   Tobacco Use  . Smoking status: Current Every Day Smoker    Packs/day: 0.50    Years: 5.00    Pack years: 2.50    Types: Cigarettes  . Smokeless tobacco: Never Used  Substance Use Topics  . Alcohol use: No  . Drug use: Yes    Types: Marijuana    Comment: quit in June 2015 per patient      Allergies   Azithromycin; Doxycycline; Prednisone; Flagyl [metronidazole]; Naproxen; Toradol [ketorolac tromethamine]; and Morphine and related   Review of Systems Review of Systems  Constitutional: Negative for fever (Subjective).  Respiratory: Negative for cough and shortness of breath.   Cardiovascular: Negative for chest pain.  Gastrointestinal: Positive for diarrhea and nausea. Negative for abdominal pain and vomiting.  Genitourinary: Positive for dysuria, flank pain and hematuria.  Neurological: Negative for headaches.  All other systems reviewed and are negative.    Physical Exam Updated Vital  Signs BP 103/74   Pulse 91   Temp 98.4 F (36.9 C)   Resp 16   SpO2 100%   Physical Exam  Constitutional: She is oriented to person, place, and time. She appears well-developed and well-nourished.  Sitting comfortably on examination table  HENT:  Head: Normocephalic and atraumatic.  Mouth/Throat: Oropharynx is clear and moist and mucous membranes are normal.  Eyes: Pupils are equal, round, and reactive to light. Conjunctivae, EOM and lids are normal.  Neck: Full passive range of motion without pain.  Cardiovascular: Normal rate, regular rhythm,  normal heart sounds and normal pulses. Exam reveals no gallop and no friction rub.  No murmur heard. Pulmonary/Chest: Effort normal and breath sounds normal.  Abdominal: Soft. Normal appearance. There is tenderness in the right lower quadrant, suprapubic area and left lower quadrant. There is CVA tenderness (Right). There is no rigidity and no guarding.  Abdomen is soft, non-distended. Diffuse tenderness noted to the lower abdomen. Right sided CVA tenderness.  No rigidity, No guarding. No peritoneal signs.  Musculoskeletal: Normal range of motion.  Neurological: She is alert and oriented to person, place, and time.  Skin: Skin is warm and dry. Capillary refill takes less than 2 seconds.  Psychiatric: She has a normal mood and affect. Her speech is normal.  Nursing note and vitals reviewed.    ED Treatments / Results  Labs (all labs ordered are listed, but only abnormal results are displayed) Labs Reviewed  COMPREHENSIVE METABOLIC PANEL - Abnormal; Notable for the following components:      Result Value   Total Protein 8.8 (*)    All other components within normal limits  CBC - Abnormal; Notable for the following components:   Platelets 420 (*)    All other components within normal limits  URINALYSIS, ROUTINE W REFLEX MICROSCOPIC - Abnormal; Notable for the following components:   APPearance HAZY (*)    Leukocytes, UA TRACE (*)     Bacteria, UA MANY (*)    All other components within normal limits  URINE CULTURE  WET PREP, GENITAL  LIPASE, BLOOD  PREGNANCY, URINE  GC/CHLAMYDIA PROBE AMP ([place]) NOT AT [facility]    EKG None  Radiology Ct Renal Stone Study  Result Date: 05/28/2018 CLINICAL DATA:  28 y/o F; hematuria, chills, diarrhea, nausea, right flank pain, recent urinary tract infection. EXAM: CT ABDOMEN AND PELVIS WITHOUT CONTRAST TECHNIQUE: Multidetector CT imaging of the abdomen and pelvis was performed following the standard protocol without IV contrast. COMPARISON:  08/05/2017 CT abdomen and pelvis. FINDINGS: Lower chest: Stable left lower lobe 4 mm perifissural nodule compatible with intrapulmonary lymph node. Hepatobiliary: No focal liver abnormality is seen. Status post cholecystectomy. No biliary dilatation. Pancreas: Unremarkable. No pancreatic ductal dilatation or surrounding inflammatory changes. Spleen: Normal in size without focal abnormality. Adrenals/Urinary Tract: Adrenal glands are unremarkable. Kidneys are normal, without renal calculi, focal lesion, or hydronephrosis. Bladder wall thickening. Stomach/Bowel: Stomach is within normal limits. Appendix appears normal. No evidence of bowel wall thickening, distention, or inflammatory changes. Vascular/Lymphatic: No significant vascular findings are present. No enlarged abdominal or pelvic lymph nodes. Reproductive: Uterus and bilateral adnexa are unremarkable. Other: No abdominal wall hernia or abnormality. No abdominopelvic ascites. Musculoskeletal: No acute or significant osseous findings. IMPRESSION: Bladder wall thickening compatible with history of urinary tract infection. Otherwise unremarkable CT of abdomen and pelvis. Electronically Signed   By: Mitzi Hansen M.D.   On: 05/28/2018 02:57    Procedures Procedures (including critical care time)  Medications Ordered in ED Medications  sodium chloride 0.9 % bolus 1,000 mL (0 mLs  Intravenous Stopped 05/28/18 0355)  cefTRIAXone (ROCEPHIN) 1 g in sodium chloride 0.9 % 100 mL IVPB (0 g Intravenous Stopped 05/28/18 0321)  ondansetron (ZOFRAN) injection 4 mg (4 mg Intravenous Given 05/28/18 0238)  morphine 4 MG/ML injection 4 mg (4 mg Intravenous Given 05/28/18 0239)     Initial Impression / Assessment and Plan / ED Course  I have reviewed the triage vital signs and the nursing notes.  Pertinent labs & imaging results that were available during  my care of the patient were reviewed by me and considered in my medical decision making (see chart for details).     27 y.o. F with PMH/o anemia, Bipolar, Fibromyalgia, Kidney stone, PID who presents for evaluation of hematuria, dysuria, fever/chills.  Recently diagnosed with UTI and was started on Bactrim but patient states she did not finish the antibiotics.  Reports getting worse over the last 3 days.  Also reports some right flank pain. Patient is afebrile, non-toxic appearing, sitting comfortably on examination table. Vital signs reviewed and stable.  Consider UTI versus pyelonephritis versus kidney stone.  Initial labs and urine ordered at triage.  UA shows trace leukocytes, pyuria.  Urine pregnancy negative.  Patient reviewed on [place] PMP. Overdose risk score is 690.  Patient received 70 oxycodone on 05/16/18, and 50 oxycodone on 04/17/2018, 150 oxycodone noted on 03/26/2018, 106 the oxycodone numbness on 02/27/2018.  Prior to that, she has oxycodone prescriptions noted for every month.  CT renal study showed no evidence of stones, pyelonephritis. There is bladder wall thickening noted. Otherwise unremarkable.   Discussed results with patient.  Patient reports she was still having some pain she was requesting for further narcotic pain medication.  Given no acute findings, no indication for further narcotic pain medication.  Instructed patient that we could try p.o. medications to see if that would help with pain.  Additionally, I discussed  with patient that given negative findings on CT scan, we should proceed with a pelvic exam for evaluation of any pelvic etiology of her symptoms.  Patient was agreeable.  RN informed me that she was sitting up in the pelvic exam, patient states that she refused the pelvic exam.  Patient did not want any more further work-up done in the ED.  Additionally, patient refused p.o. medications.  Patient is hemodynamically stable for discharge at this time. Patient had ample opportunity for questions and discussion. All patient's questions were answered with full understanding.  Strict return precautions discussed. Patient expresses understanding and agreement to plan.   Final Clinical Impressions(s) / ED Diagnoses   Final diagnoses:  Acute cystitis with hematuria    ED Discharge Orders         Ordered    cephALEXin (KEFLEX) 500 MG capsule  2 times daily     05/28/18 0357           Maxwell Caul, PA-C 05/28/18 8119    Geoffery Lyons, MD 05/28/18 2306

## 2018-05-28 NOTE — Discharge Instructions (Signed)
You can take Tylenol or Ibuprofen as directed for pain. You can alternate Tylenol and Ibuprofen every 4 hours. If you take Tylenol at 1pm, then you can take Ibuprofen at 5pm. Then you can take Tylenol again at 9pm.   Take antibiotics as directed. Please take all of your antibiotics until finished.  Make sure you are drinking plenty of fluids and staying hydrated.   Return to the Emergency Department for any worsening pain, fever, or any other worsening or concerning symptoms.

## 2018-05-28 NOTE — ED Notes (Signed)
Pt refused wet prep/GC Chlamydia ordered by EDP. EDP notified. Pt also refused Tylenol 1000mg .

## 2018-05-28 NOTE — ED Triage Notes (Signed)
Pt was seen for UTI and started on Bactrim. She was not getting any better. She now has hematuria, hot/cold chills, diarrhea, nausea. She has right flank pain.

## 2018-05-28 NOTE — ED Notes (Signed)
Pt does not want blood drawn in triage. She wants to wait to see if the doctor will order an IV first.

## 2018-05-28 NOTE — ED Notes (Signed)
Pt was given a ginger ale for PO challenge 

## 2018-05-30 DIAGNOSIS — F319 Bipolar disorder, unspecified: Secondary | ICD-10-CM | POA: Diagnosis not present

## 2018-05-30 DIAGNOSIS — M545 Low back pain: Secondary | ICD-10-CM | POA: Diagnosis not present

## 2018-05-30 LAB — URINE CULTURE
Culture: >=100000 — AB
SPECIAL REQUESTS: NORMAL

## 2018-05-31 ENCOUNTER — Telehealth: Payer: Self-pay | Admitting: *Deleted

## 2018-05-31 NOTE — Telephone Encounter (Signed)
Post ED Visit - Positive Culture Follow-up: Unsuccessful Patient Follow-up  Culture assessed and recommendations reviewed by:  []  Enzo BiNathan Batchelder, Pharm.D. []  Celedonio MiyamotoJeremy Frens, Pharm.D., BCPS AQ-ID []  Garvin FilaMike Maccia, Pharm.D., BCPS []  Georgina PillionElizabeth Martin, Pharm.D., BCPS []  BrendaMinh Pham, [address] BoulevardPharm.D., BCPS, AAHIVP []  Estella HuskMichelle Turner, Pharm.D., BCPS, AAHIVP []  Sherlynn CarbonAustin Lucas, PharmD []  Pollyann SamplesAndy Johnston, PharmD, BCPS  Positive urine culture, reviewed by Lyndel SafeElizabeth Hammond, PA-C  []  Patient discharged without antimicrobial prescription and treatment is now indicated [x]  Organism is resistant to prescribed ED discharge antimicrobial []  Patient with positive blood cultures  Plan:  D/C Keflex, if still with flank pain, painful urination and nausea, return to ED for IV antibiotics Unable to contact patient after 3 attempts, letter will be sent to address on file  Lysle PearlRobertson, Dayra Rapley Talley 05/31/2018, 1:00 PM

## 2018-05-31 NOTE — Progress Notes (Signed)
ED Antimicrobial Stewardship Positive Culture Follow Up   Tonya ApleyLindsay M Davidson is an 27 y.o. female who presented to [facility] on 05/28/2018 with a chief complaint of  Chief Complaint  Patient presents with  . Flank Pain    Recent Results (from the past 720 hour(s))  Urine culture     Status: Abnormal   Collection Time: 05/28/18 12:25 AM  Result Value Ref Range Status   Specimen Description   Final    URINE, CLEAN CATCH Performed at [facility], [address] [place], [place] [address]    Special Requests   Final    Normal Performed at [facility], [address] [place], [place] [address]    Culture (A)  Final    >=100,000 COLONIES/mL ESCHERICHIA COLI Confirmed Extended Spectrum Beta-Lactamase Producer (ESBL).  In bloodstream infections from ESBL organisms, carbapenems are preferred over piperacillin/tazobactam. They are shown to have a lower risk of mortality.    Report Status 05/30/2018 FINAL  Final   Organism ID, Bacteria ESCHERICHIA COLI (A)  Final      Susceptibility   Escherichia coli - MIC*    AMPICILLIN >=32 RESISTANT Resistant     CEFAZOLIN >=64 RESISTANT Resistant     CEFTRIAXONE >=64 RESISTANT Resistant     CIPROFLOXACIN >=4 RESISTANT Resistant     GENTAMICIN <=1 SENSITIVE Sensitive     IMIPENEM <=0.25 SENSITIVE Sensitive     NITROFURANTOIN <=16 SENSITIVE Sensitive     TRIMETH/SULFA >=320 RESISTANT Resistant     AMPICILLIN/SULBACTAM >=32 RESISTANT Resistant     PIP/TAZO <=4 SENSITIVE Sensitive     Extended ESBL POSITIVE Resistant     * >=100,000 COLONIES/mL ESCHERICHIA COLI   DC keflex - resistant If patient with continued symptoms, she will need to return to ED for IV abx  ED Provider: Lyndel SafeElizabeth Hammond, PA-C  Bertram MillardMichael A Shantrice Rodenberg 05/31/2018, 10:58 AM Clinical Pharmacist Monday - Friday phone -  [phone] Saturday - Sunday phone - [phone]

## 2018-06-23 ENCOUNTER — Emergency Department (HOSPITAL_COMMUNITY): Payer: BLUE CROSS/BLUE SHIELD

## 2018-06-23 ENCOUNTER — Emergency Department (HOSPITAL_COMMUNITY)
Admission: EM | Admit: 2018-06-23 | Discharge: 2018-06-23 | Disposition: A | Discharge status: Home / Self Care | Payer: BLUE CROSS/BLUE SHIELD | Attending: Emergency Medicine | Admitting: Emergency Medicine

## 2018-06-23 ENCOUNTER — Encounter (HOSPITAL_COMMUNITY): Payer: Self-pay | Admitting: Emergency Medicine

## 2018-06-23 DIAGNOSIS — N898 Other specified noninflammatory disorders of vagina: Secondary | ICD-10-CM | POA: Diagnosis not present

## 2018-06-23 DIAGNOSIS — F17210 Nicotine dependence, cigarettes, uncomplicated: Secondary | ICD-10-CM | POA: Insufficient documentation

## 2018-06-23 DIAGNOSIS — Z79899 Other long term (current) drug therapy: Secondary | ICD-10-CM | POA: Insufficient documentation

## 2018-06-23 DIAGNOSIS — B373 Candidiasis of vulva and vagina: Secondary | ICD-10-CM | POA: Diagnosis not present

## 2018-06-23 DIAGNOSIS — R102 Pelvic and perineal pain: Secondary | ICD-10-CM | POA: Diagnosis not present

## 2018-06-23 DIAGNOSIS — A6004 Herpesviral vulvovaginitis: Secondary | ICD-10-CM | POA: Diagnosis not present

## 2018-06-23 DIAGNOSIS — R1030 Lower abdominal pain, unspecified: Secondary | ICD-10-CM | POA: Diagnosis not present

## 2018-06-23 DIAGNOSIS — B3731 Acute candidiasis of vulva and vagina: Secondary | ICD-10-CM

## 2018-06-23 LAB — URINALYSIS, ROUTINE W REFLEX MICROSCOPIC
BILIRUBIN URINE: NEGATIVE
Glucose, UA: NEGATIVE mg/dL
Hgb urine dipstick: NEGATIVE
KETONES UR: NEGATIVE mg/dL
Nitrite: NEGATIVE
PROTEIN: NEGATIVE mg/dL
Specific Gravity, Urine: 1.021 (ref 1.005–1.030)
pH: 5 (ref 5.0–8.0)

## 2018-06-23 LAB — WET PREP, GENITAL
Sperm: NONE SEEN
TRICH WET PREP: NONE SEEN

## 2018-06-23 LAB — POC URINE PREG, ED: Preg Test, Ur: NEGATIVE

## 2018-06-23 MED ORDER — LIDOCAINE HCL URETHRAL/MUCOSAL 2 % EX GEL: 1.0000 "application " | CUTANEOUS | 0 refills | Status: DC | PRN

## 2018-06-23 MED ORDER — LIDOCAINE HCL URETHRAL/MUCOSAL 2 % EX GEL
1.0000 "application " | Freq: Once | CUTANEOUS | Status: AC
  Administered 2018-06-23: 1 via URETHRAL
  Filled 2018-06-23: qty 5

## 2018-06-23 MED ORDER — FLUCONAZOLE 150 MG PO TABS: 150.0000 mg | ORAL_TABLET | Freq: Every day | ORAL | 0 refills | Status: AC

## 2018-06-23 MED ORDER — FLUCONAZOLE 150 MG PO TABS
150.0000 mg | ORAL_TABLET | Freq: Once | ORAL | Status: DC
  Filled 2018-06-23: qty 1

## 2018-06-23 MED ORDER — VALACYCLOVIR HCL 1 G PO TABS: 1000.0000 mg | ORAL_TABLET | Freq: Two times a day (BID) | ORAL | 0 refills | Status: AC

## 2018-06-23 MED ORDER — SODIUM CHLORIDE 0.9 % IV BOLUS: 1000.0000 mL | Freq: Once | INTRAVENOUS | Status: DC

## 2018-06-23 MED ORDER — VALACYCLOVIR HCL 500 MG PO TABS: 1000.0000 mg | ORAL_TABLET | Freq: Once | ORAL | Status: DC

## 2018-06-23 MED ORDER — ONDANSETRON HCL 4 MG PO TABS: 4.0000 mg | ORAL_TABLET | Freq: Once | ORAL | Status: DC

## 2018-06-23 MED ORDER — TRAMADOL HCL 50 MG PO TABS
50.0000 mg | ORAL_TABLET | Freq: Once | ORAL | Status: DC
  Filled 2018-06-23: qty 1

## 2018-06-23 MED ORDER — HYDROCODONE-ACETAMINOPHEN 5-325 MG PO TABS
2.0000 | ORAL_TABLET | Freq: Once | ORAL | Status: AC
  Administered 2018-06-23: 2 via ORAL
  Filled 2018-06-23: qty 2

## 2018-06-23 NOTE — Discharge Instructions (Signed)
You were treated for yeast infection today.  He will take a second dose on day 3. Use a condom with every sexual encounter Follow up with your doctor OBGYN in regards to today's visit.   Please return to the ER for worsening symptoms, high fevers or persistent vomiting.  You have been tested for gonorrhea and chlamydia.  These results will be available in approximately 3 days. You will be notified if they are positive.   You will take Valtrex twice a day for 7 days.  Do not have any intercourse as long as you have lesions.  You can spread it as long as you have the blisters.  Once they fully go away, you are no longer infectious.  It is very important to practice safe sex and use condoms when sexually active. If your results are positive you need to notify all sexual partners so they can be treated as well. The website https://garcia.net/ can be used to send anonymous text messages or emails to alert sexual contacts.   SEEK IMMEDIATE MEDICAL CARE IF:  You develop an oral temperature above 102 F (38.9 C), not controlled by medications or lasting more than 2 days.  You develop an increase in pain.  You develop vaginal bleeding and it is not time for your period.  You develop painful intercourse.

## 2018-06-23 NOTE — ED Notes (Signed)
Pelvic cart at bedside, if needed.

## 2018-06-23 NOTE — ED Provider Notes (Signed)
[facility]-EMERGENCY DEPT Provider Note   CSN: [id number] Arrival date & time: 06/23/18  1610     History   Chief Complaint Chief Complaint  Patient presents with  . vaginal pain  . Groin Pain    HPI Tonya Davidson is a 27 y.o. female.  HPI  Patient is a 27 year old female with a history of bipolar 1 disorder, IBS, myofascial pain syndrome, recurrent urinary tract infection and pyelonephritis presenting for pelvic pain, "lumps" in the vaginal region, and vaginal discharge.  Patient reports that symptoms began yesterday.  She describes that the pain is worse in the left pelvis.  She noticed a "bump" in the pelvic region yesterday.  She describes the pain as sharp and stabbing.  Patient reports that it was worse after intercourse.  Patient reports nausea but no vomiting.  Patient reports burning with urination, but denies urinary urgency, frequency.  No fever or chills.  Abdominal surgical history significant for laparoscopy and cholecystectomy.  Past Medical History:  Diagnosis Date  . Abnormal Pap smear    colposcopy  . Anemia   . Anxiety   . Arthritis   . Bipolar 1 disorder (HCC)   . C. difficile diarrhea   . Chlamydia 04/29/2012  . Depression   . Fibromyalgia   . HPV in female   . IBS (irritable bowel syndrome)   . Kidney stones   . Mononucleosis   . Pelvic pain in female 12/14/2014  . PID (acute pelvic inflammatory disease) 04/28/2012  . Ulcer     Patient Active Problem List   Diagnosis Date Noted  . Pyelonephritis 09/15/2016  . Right flank pain   . Pelvic pain in female 12/14/2014  . Major depressive disorder, recurrent episode, severe, without mention of psychotic behavior 10/03/2013  . GAD (generalized anxiety disorder) 09/30/2013  . PID (acute pelvic inflammatory disease) 04/28/2012  . ANXIETY 05/18/2009  . IBS 05/18/2009    Past Surgical History:  Procedure Laterality Date  . COLPOSCOPY    . INNER EAR SURGERY    . KIDNEY STONE  SURGERY    . LAPAROSCOPIC CHOLECYSTECTOMY    . TONSILLECTOMY    . WISDOM TOOTH EXTRACTION       OB History    Gravida  0   Para      Term      Preterm      AB      Living        SAB      TAB      Ectopic      Multiple      Live Births               Home Medications    Prior to Admission medications   Medication Sig Start Date End Date Taking? Authorizing Provider  acetaminophen (TYLENOL) 500 MG tablet Take 1,500 mg by mouth every 6 (six) hours as needed for moderate pain.    [provider]  ARIPiprazole (ABILIFY) 2 MG tablet Take 1 mg by mouth daily.    [provider]  BELSOMRA 10 MG TABS Take 10 mg by mouth at bedtime. 01/16/18   [provider]  clonazePAM (KLONOPIN) 0.5 MG tablet Take 0.5 mg by mouth 2 (two) times daily. 12/31/17   [provider]  cyclobenzaprine (FLEXERIL) 5 MG tablet Take 1 tablet (5 mg total) 2 (two) times daily as needed by mouth for muscle spasms. Patient not taking: Reported on 01/18/2018 08/05/17   Horton, Toni Amend  F, MD  drospirenone-ethinyl estradiol (YAZ,GIANVI,LORYNA) 3-0.02 MG tablet Take 1 tablet by mouth daily.    [provider]  ibuprofen (ADVIL,MOTRIN) 600 MG tablet Take 1 tablet (600 mg total) every 6 (six) hours as needed by mouth. Patient not taking: Reported on 01/18/2018 08/05/17   Horton, Mayer Masker, MD  nitrofurantoin, macrocrystal-monohydrate, (MACROBID) 100 MG capsule Take 1 capsule (100 mg total) by mouth 2 (two) times daily. Patient not taking: Reported on 01/18/2018 04/28/17   Phillis Haggis, MD  ondansetron (ZOFRAN ODT) 4 MG disintegrating tablet Take 1 tablet (4 mg total) by mouth every 8 (eight) hours as needed for nausea or vomiting. Patient not taking: Reported on 01/18/2018 04/28/17   Phillis Haggis, MD  oxyCODONE-acetaminophen (PERCOCET/ROXICET) 5-325 MG tablet Take 1-2 tablets by mouth every 6 (six) hours as needed for severe pain. Patient not taking: Reported on 01/18/2018  04/28/17   Phillis Haggis, MD  Phenyleph-Doxylamine-DM-APAP (NYQUIL SEVERE COLD/FLU) 5-6.25-10-325 MG CAPS Take 2 capsules by mouth at bedtime.    [provider]  sertraline (ZOLOFT) 25 MG tablet Take 12.5 mg by mouth daily.  01/06/18   [provider]    Family History Family History  Problem Relation Age of Onset  . Bipolar disorder Mother   . Cancer Father   . ADD / ADHD Brother   . Cancer Maternal Grandmother   . Cancer Maternal Grandfather   . Cancer Paternal Grandmother   . Cancer Paternal Grandfather     Social History Social History   Tobacco Use  . Smoking status: Current Every Day Smoker    Packs/day: 0.50    Years: 5.00    Pack years: 2.50    Types: Cigarettes  . Smokeless tobacco: Never Used  Substance Use Topics  . Alcohol use: No  . Drug use: Yes    Types: Marijuana    Comment: quit in June 2015 per patient      Allergies   Azithromycin; Doxycycline; Prednisone; Flagyl [metronidazole]; Naproxen; Toradol [ketorolac tromethamine]; and Morphine and related   Review of Systems Review of Systems  Constitutional: Negative for chills and fever.  Gastrointestinal: Positive for abdominal pain. Negative for nausea and vomiting.  Genitourinary: Positive for pelvic pain and vaginal discharge. Negative for dysuria, frequency and urgency.  All other systems reviewed and are negative.    Physical Exam Updated Vital Signs BP 115/77   Pulse 92   Temp 98.5 F (36.9 C) (Oral)   Resp 18   Ht 5\' 4"  (1.626 m)   Wt 59 kg   LMP 06/09/2018 (Exact Date)   SpO2 100%   BMI 22.31 kg/m   Physical Exam  Constitutional: She appears well-developed and well-nourished. No distress.  HENT:  Head: Normocephalic and atraumatic.  Mouth/Throat: Oropharynx is clear and moist.  Eyes: Pupils are equal, round, and reactive to light. Conjunctivae and EOM are normal.  Neck: Normal range of motion. Neck supple.  Cardiovascular: Normal rate, regular rhythm, S1  normal and S2 normal.  No murmur heard. Pulmonary/Chest: Effort normal and breath sounds normal. She has no wheezes. She has no rales.  Abdominal: Soft. She exhibits no distension. There is tenderness. There is no guarding.  Suprapubic and left lower abdominal tenderness to palpation.  Genitourinary:  Genitourinary Comments: Examination performed with RN chaperone present.  No lymphadenopathy.  Patient has vesicular lesions lateral to the left labia.  No lesions noted in the vaginal tissue.  Cervix is erythematous. Large amount of thick, white discharge in vaginal  vault.   Musculoskeletal: Normal range of motion. She exhibits no edema or deformity.  Neurological: She is alert.  Cranial nerves grossly intact. Patient moves extremities symmetrically and with good coordination.  Skin: Skin is warm and dry. No rash noted. No erythema.  Psychiatric: She has a normal mood and affect. Her behavior is normal. Judgment and thought content normal.  Nursing note and vitals reviewed.    ED Treatments / Results  Labs (all labs ordered are listed, but only abnormal results are displayed) Labs Reviewed  WET PREP, GENITAL - Abnormal; Notable for the following components:      Result Value   Yeast Wet Prep HPF POC PRESENT (*)    Clue Cells Wet Prep HPF POC PRESENT (*)    WBC, Wet Prep HPF POC MANY (*)    All other components within normal limits  URINALYSIS, ROUTINE W REFLEX MICROSCOPIC - Abnormal; Notable for the following components:   Leukocytes, UA SMALL (*)    Bacteria, UA RARE (*)    All other components within normal limits  URINE CULTURE  POC URINE PREG, ED  GC/CHLAMYDIA PROBE AMP ([place]) NOT AT [facility]    EKG None  Radiology No results found.  Procedures Procedures (including critical care time)  Medications Ordered in ED Medications  ondansetron (ZOFRAN) tablet 4 mg (has no administration in time range)  HYDROcodone-acetaminophen (NORCO/VICODIN) 5-325 MG per tablet 2  tablet (2 tablets Oral Given 06/23/18 1854)  lidocaine (XYLOCAINE) 2 % jelly 1 application (1 application Urethral Given 06/23/18 1930)     Initial Impression / Assessment and Plan / ED Course  I have reviewed the triage vital signs and the nursing notes.  Pertinent labs & imaging results that were available during my care of the patient were reviewed by me and considered in my medical decision making (see chart for details).  Clinical Course as of Jun 23 2117  Wynelle Link Jun 23, 2018  4098 Patient verbally verified a safe ride from the ED. Proceeded with administering Tramadol for pain/relaxtion/muscle relaxation in the ED.   [AM]  2049 Urinalysis, Routine w reflex microscopic(!) [AM]  2118 All information was printed for patient, however patient left without receiving it stating that she did not want to wait any longer to receive her prescriptions.  All precautions were given to patient prior to discharge.   [AM]    Clinical Course User Index [AM] Elisha Ponder, PA-C    Patient nontoxic-appearing and with benign abdomen.  Differential diagnosis includes STI, PID, HSV vulvovaginitis, candidal vaginitis, bacterial vaginosis, ovarian torsion, ovarian cyst  Work-up significant for wet prep with yeast, WBCs, and clue cells.  Will treat.  Pelvic ultrasound was limited due to patient's refusal of intravaginal, however it showed no evidence suggestive of torsion nor adnexal mass.  Clinically, patient has active herpes simplex infection.  Patient was given topical lidocaine.   No evidence of PID. Do not suspect acute abdominal process.   Patient was instructed not to have sexual intercourse until the lesions are scabbed over, as there is still infectious.  Return precautions given for any new or worsening symptoms including fever, chills, worsening pain, nausea, vomiting.  Patient is in understanding and agrees with plan of care.  Final Clinical Impressions(s) / ED Diagnoses   Final diagnoses:    Vaginal discharge  Vaginal yeast infection  Herpes simplex vulvovaginitis    ED Discharge Orders         Ordered    valACYclovir (VALTREX) 1000 MG  tablet  2 times daily     06/23/18 2111    fluconazole (DIFLUCAN) 150 MG tablet  Daily     06/23/18 2111    lidocaine (XYLOCAINE) 2 % jelly  As needed     06/23/18 2111           Delia Chimes 06/23/18 2128    Gwyneth Sprout, MD 06/23/18 2212

## 2018-06-23 NOTE — ED Notes (Signed)
This staff member went into the pt room to give her medication and her discharge instructions. Pt's gown was found on the bed as well as blood pressure cuff and blanket. Dept and lobby were searched to find pt with no success. PA notified pt left without receiving medication and discharge paperwork/ prescription.

## 2018-06-23 NOTE — ED Notes (Addendum)
ED Provider at bedside. 

## 2018-06-23 NOTE — ED Notes (Addendum)
Pt requesting pain medication. Pa notified

## 2018-06-23 NOTE — ED Triage Notes (Signed)
Pt c/o L groin pain that pt states is severe. Pt also c/o severe vaginal pain with discharge.

## 2018-06-23 NOTE — ED Notes (Signed)
Pt refusing ultram and to get blood drawn

## 2018-06-23 NOTE — ED Notes (Signed)
Pt requesting to speak to EDPA or RN, states that she would [facility] than be given non narcotic pain medication. States that she also does not want to have a pelvic done if she not going to be given something other than tramodol

## 2018-06-23 NOTE — ED Notes (Addendum)
Pt states that she is in sevre pain, upon seeing pelvic cart supplies asked if she could get pain medication before any examination and requested to see the the EDP.

## 2018-06-23 NOTE — ED Notes (Signed)
Dayton Scrape, MD made aware of patient refusal

## 2018-06-23 NOTE — ED Notes (Signed)
US at bedside

## 2018-06-23 NOTE — ED Notes (Addendum)
Pt refusing to be stuck with any needles. Agrees to give urine sample for I-stat beta/poc urine preg

## 2018-06-24 LAB — GC/CHLAMYDIA PROBE AMP (~~LOC~~) NOT AT ARMC
Chlamydia: NEGATIVE
Neisseria Gonorrhea: NEGATIVE

## 2018-06-25 LAB — URINE CULTURE: Culture: NO GROWTH

## 2018-06-26 DIAGNOSIS — Z79899 Other long term (current) drug therapy: Secondary | ICD-10-CM | POA: Diagnosis not present

## 2018-06-26 DIAGNOSIS — M79604 Pain in right leg: Secondary | ICD-10-CM | POA: Diagnosis not present

## 2018-06-26 DIAGNOSIS — M545 Low back pain: Secondary | ICD-10-CM | POA: Diagnosis not present

## 2018-06-26 DIAGNOSIS — Z79891 Long term (current) use of opiate analgesic: Secondary | ICD-10-CM | POA: Diagnosis not present

## 2018-06-26 DIAGNOSIS — G894 Chronic pain syndrome: Secondary | ICD-10-CM | POA: Diagnosis not present

## 2018-07-01 ENCOUNTER — Other Ambulatory Visit: Payer: Self-pay | Admitting: Pain Medicine

## 2018-07-01 DIAGNOSIS — R292 Abnormal reflex: Secondary | ICD-10-CM

## 2018-07-21 ENCOUNTER — Encounter (HOSPITAL_COMMUNITY): Payer: Self-pay

## 2018-07-21 ENCOUNTER — Other Ambulatory Visit: Payer: Self-pay

## 2018-07-21 ENCOUNTER — Emergency Department (HOSPITAL_COMMUNITY)
Admission: EM | Admit: 2018-07-21 | Discharge: 2018-07-21 | Disposition: A | Discharge status: Home / Self Care | Payer: BLUE CROSS/BLUE SHIELD | Attending: Emergency Medicine | Admitting: Emergency Medicine

## 2018-07-21 ENCOUNTER — Emergency Department (HOSPITAL_COMMUNITY): Payer: BLUE CROSS/BLUE SHIELD

## 2018-07-21 DIAGNOSIS — Z87442 Personal history of urinary calculi: Secondary | ICD-10-CM | POA: Insufficient documentation

## 2018-07-21 DIAGNOSIS — D649 Anemia, unspecified: Secondary | ICD-10-CM | POA: Diagnosis not present

## 2018-07-21 DIAGNOSIS — F319 Bipolar disorder, unspecified: Secondary | ICD-10-CM | POA: Insufficient documentation

## 2018-07-21 DIAGNOSIS — R300 Dysuria: Secondary | ICD-10-CM | POA: Diagnosis not present

## 2018-07-21 DIAGNOSIS — Z79899 Other long term (current) drug therapy: Secondary | ICD-10-CM | POA: Diagnosis not present

## 2018-07-21 DIAGNOSIS — N200 Calculus of kidney: Secondary | ICD-10-CM | POA: Diagnosis not present

## 2018-07-21 DIAGNOSIS — N12 Tubulo-interstitial nephritis, not specified as acute or chronic: Secondary | ICD-10-CM

## 2018-07-21 DIAGNOSIS — F17210 Nicotine dependence, cigarettes, uncomplicated: Secondary | ICD-10-CM | POA: Insufficient documentation

## 2018-07-21 LAB — CBC WITH DIFFERENTIAL/PLATELET
ABS IMMATURE GRANULOCYTES: 0.01 10*3/uL (ref 0.00–0.07)
Basophils Absolute: 0.1 10*3/uL (ref 0.0–0.1)
Basophils Relative: 1 %
EOS PCT: 7 %
Eosinophils Absolute: 0.6 10*3/uL — ABNORMAL HIGH (ref 0.0–0.5)
HCT: 38.4 % (ref 36.0–46.0)
HEMOGLOBIN: 12.4 g/dL (ref 12.0–15.0)
Immature Granulocytes: 0 %
LYMPHS ABS: 3 10*3/uL (ref 0.7–4.0)
LYMPHS PCT: 38 %
MCH: 30.6 pg (ref 26.0–34.0)
MCHC: 32.3 g/dL (ref 30.0–36.0)
MCV: 94.8 fL (ref 80.0–100.0)
MONO ABS: 0.5 10*3/uL (ref 0.1–1.0)
Monocytes Relative: 6 %
NEUTROS ABS: 3.8 10*3/uL (ref 1.7–7.7)
Neutrophils Relative %: 48 %
Platelets: 313 10*3/uL (ref 150–400)
RBC: 4.05 MIL/uL (ref 3.87–5.11)
RDW: 11.9 % (ref 11.5–15.5)
WBC: 7.9 10*3/uL (ref 4.0–10.5)
nRBC: 0 % (ref 0.0–0.2)

## 2018-07-21 LAB — COMPREHENSIVE METABOLIC PANEL
ALK PHOS: 70 U/L (ref 38–126)
ALT: 12 U/L (ref 0–44)
ANION GAP: 8 (ref 5–15)
AST: 17 U/L (ref 15–41)
Albumin: 3.8 g/dL (ref 3.5–5.0)
BILIRUBIN TOTAL: 0.5 mg/dL (ref 0.3–1.2)
BUN: 9 mg/dL (ref 6–20)
CO2: 23 mmol/L (ref 22–32)
CREATININE: 0.73 mg/dL (ref 0.44–1.00)
Calcium: 8.8 mg/dL — ABNORMAL LOW (ref 8.9–10.3)
Chloride: 106 mmol/L (ref 98–111)
GFR calc non Af Amer: >60 mL/min (ref >60)
GLUCOSE: 89 mg/dL (ref 70–99)
Potassium: 3.6 mmol/L (ref 3.5–5.1)
Sodium: 137 mmol/L (ref 135–145)
TOTAL PROTEIN: 7.4 g/dL (ref 6.5–8.1)

## 2018-07-21 LAB — URINALYSIS, ROUTINE W REFLEX MICROSCOPIC
BILIRUBIN URINE: NEGATIVE
GLUCOSE, UA: NEGATIVE mg/dL
Ketones, ur: NEGATIVE mg/dL
LEUKOCYTES UA: NEGATIVE
Nitrite: NEGATIVE
Protein, ur: NEGATIVE mg/dL
RBC / HPF: >50 RBC/hpf — ABNORMAL HIGH (ref 0–5)
Specific Gravity, Urine: 1.011 (ref 1.005–1.030)
pH: 6 (ref 5.0–8.0)

## 2018-07-21 LAB — PREGNANCY, URINE: Preg Test, Ur: NEGATIVE

## 2018-07-21 MED ORDER — FENTANYL CITRATE (PF) 100 MCG/2ML IJ SOLN
50.0000 ug | Freq: Once | INTRAMUSCULAR | Status: AC
  Administered 2018-07-21: 50 ug via INTRAVENOUS
  Filled 2018-07-21: qty 2

## 2018-07-21 MED ORDER — SODIUM CHLORIDE 0.9 % IV BOLUS
500.0000 mL | Freq: Once | INTRAVENOUS | Status: AC
  Administered 2018-07-21: 500 mL via INTRAVENOUS

## 2018-07-21 MED ORDER — OXYCODONE-ACETAMINOPHEN 5-325 MG PO TABS
1.0000 | ORAL_TABLET | Freq: Once | ORAL | Status: AC
  Administered 2018-07-21: 1 via ORAL
  Filled 2018-07-21: qty 1

## 2018-07-21 MED ORDER — ACETAMINOPHEN 325 MG PO TABS
650.0000 mg | ORAL_TABLET | Freq: Once | ORAL | Status: DC
  Filled 2018-07-21: qty 2

## 2018-07-21 MED ORDER — ONDANSETRON HCL 4 MG/2ML IJ SOLN
4.0000 mg | Freq: Once | INTRAMUSCULAR | Status: AC
  Administered 2018-07-21: 4 mg via INTRAVENOUS
  Filled 2018-07-21: qty 2

## 2018-07-21 MED ORDER — CEPHALEXIN 500 MG PO CAPS: 500.0000 mg | ORAL_CAPSULE | Freq: Two times a day (BID) | ORAL | 0 refills | Status: AC

## 2018-07-21 NOTE — ED Notes (Signed)
US at bedside

## 2018-07-21 NOTE — ED Provider Notes (Signed)
[facility]-EMERGENCY DEPT Provider Note   CSN: [id number] Arrival date & time: 07/21/18  1524     History   Chief Complaint Chief Complaint  Patient presents with  . Urinary Tract Infection  . Flank Pain    HPI Tonya Davidson is a 27 y.o. female with past medical history of bipolar 1, kidney stones, fibromyalgia, PID, anxiety, who presents today for evaluation of burning and pain with urination, blood in her urine along with left-sided flank pain.  She states that she was here to drop off her boyfriend and decided to get checked out also.  She denies any abnormal vaginal discharge.  She denies any anterior abdominal pain.  She has not been sick recently, no cough, fever, shortness of breath or chest pain.  She denies concern for STD.  Does not feel like she needs a pelvic exam today.  She is not currently mensurating.   HPI  Past Medical History:  Diagnosis Date  . Abnormal Pap smear    colposcopy  . Anemia   . Anxiety   . Arthritis   . Bipolar 1 disorder (HCC)   . C. difficile diarrhea   . Chlamydia 04/29/2012  . Depression   . Fibromyalgia   . HPV in female   . IBS (irritable bowel syndrome)   . Kidney stones   . Mononucleosis   . Pelvic pain in female 12/14/2014  . PID (acute pelvic inflammatory disease) 04/28/2012  . Ulcer     Patient Active Problem List   Diagnosis Date Noted  . Pyelonephritis 09/15/2016  . Right flank pain   . Pelvic pain in female 12/14/2014  . Major depressive disorder, recurrent episode, severe, without mention of psychotic behavior 10/03/2013  . GAD (generalized anxiety disorder) 09/30/2013  . PID (acute pelvic inflammatory disease) 04/28/2012  . ANXIETY 05/18/2009  . IBS 05/18/2009    Past Surgical History:  Procedure Laterality Date  . COLPOSCOPY    . INNER EAR SURGERY    . KIDNEY STONE SURGERY    . LAPAROSCOPIC CHOLECYSTECTOMY    . TONSILLECTOMY    . WISDOM TOOTH EXTRACTION       OB History    Gravida    0   Para      Term      Preterm      AB      Living        SAB      TAB      Ectopic      Multiple      Live Births               Home Medications    Prior to Admission medications   Medication Sig Start Date End Date Taking? Authorizing Provider  acetaminophen (TYLENOL) 500 MG tablet Take 1,500 mg by mouth every 6 (six) hours as needed for moderate pain.   Yes [provider]  ARIPiprazole (ABILIFY) 2 MG tablet Take 1 mg by mouth daily.   Yes [provider]  BELSOMRA 10 MG TABS Take 10 mg by mouth at bedtime. 01/16/18  Yes [provider]  clonazePAM (KLONOPIN) 0.5 MG tablet Take 0.5 mg by mouth 2 (two) times daily. 12/31/17  Yes [provider]  drospirenone-ethinyl estradiol (YAZ,GIANVI,LORYNA) 3-0.02 MG tablet Take 1 tablet by mouth daily.   Yes [provider]  ibuprofen (ADVIL,MOTRIN) 200 MG tablet Take 800 mg by mouth every 6 (six) hours as needed for moderate pain.  Yes [provider]  sertraline (ZOLOFT) 25 MG tablet Take 12.5 mg by mouth daily.  01/06/18  Yes [provider]  cephALEXin (KEFLEX) 500 MG capsule Take 1 capsule (500 mg total) by mouth 2 (two) times daily for 14 days. 07/21/18 08/04/18  Cristina Gong, PA-C  cyclobenzaprine (FLEXERIL) 5 MG tablet Take 1 tablet (5 mg total) 2 (two) times daily as needed by mouth for muscle spasms. Patient not taking: Reported on 01/18/2018 08/05/17   Horton, Mayer Masker, MD  ibuprofen (ADVIL,MOTRIN) 600 MG tablet Take 1 tablet (600 mg total) every 6 (six) hours as needed by mouth. Patient not taking: Reported on 01/18/2018 08/05/17   Horton, Mayer Masker, MD  lidocaine (XYLOCAINE) 2 % jelly Apply 1 application topically as needed. Patient not taking: Reported on 07/21/2018 06/23/18   Elisha Ponder, PA-C  nitrofurantoin, macrocrystal-monohydrate, (MACROBID) 100 MG capsule Take 1 capsule (100 mg total) by mouth 2 (two) times daily. Patient not taking:  Reported on 01/18/2018 04/28/17   Phillis Haggis, MD  ondansetron (ZOFRAN ODT) 4 MG disintegrating tablet Take 1 tablet (4 mg total) by mouth every 8 (eight) hours as needed for nausea or vomiting. Patient not taking: Reported on 01/18/2018 04/28/17   Phillis Haggis, MD  oxyCODONE-acetaminophen (PERCOCET/ROXICET) 5-325 MG tablet Take 1-2 tablets by mouth every 6 (six) hours as needed for severe pain. Patient not taking: Reported on 01/18/2018 04/28/17   Mabe, Latanya Maudlin, MD    Family History Family History  Problem Relation Age of Onset  . Bipolar disorder Mother   . Cancer Father   . ADD / ADHD Brother   . Cancer Maternal Grandmother   . Cancer Maternal Grandfather   . Cancer Paternal Grandmother   . Cancer Paternal Grandfather     Social History Social History   Tobacco Use  . Smoking status: Current Every Day Smoker    Packs/day: 0.50    Years: 5.00    Pack years: 2.50    Types: Cigarettes  . Smokeless tobacco: Never Used  Substance Use Topics  . Alcohol use: No  . Drug use: Yes    Types: Marijuana    Comment: quit in June 2015 per patient      Allergies   Azithromycin; Doxycycline; Prednisone; Bactrim [sulfamethoxazole-trimethoprim]; Flagyl [metronidazole]; Naproxen; Toradol [ketorolac tromethamine]; and Morphine and related   Review of Systems Review of Systems  Constitutional: Negative for chills and fever.  Respiratory: Negative for chest tightness and shortness of breath.   Gastrointestinal: Negative for abdominal pain, diarrhea, nausea and vomiting.  Genitourinary: Positive for dysuria, flank pain, frequency and hematuria. Negative for difficulty urinating, pelvic pain, vaginal bleeding, vaginal discharge and vaginal pain.  All other systems reviewed and are negative.    Physical Exam Updated Vital Signs BP (!) 105/93   Pulse 98   Temp 98.3 F (36.8 C) (Oral)   Resp 18   Wt 62.6 kg   LMP 07/07/2018 (Approximate)   SpO2 98%   BMI 23.69 kg/m   Physical Exam   Constitutional: She appears well-developed and well-nourished. No distress.  HENT:  Head: Normocephalic and atraumatic.  Mouth/Throat: Oropharynx is clear and moist.  Eyes: Conjunctivae are normal.  Neck: Normal range of motion. Neck supple.  Cardiovascular: Normal rate, regular rhythm, normal heart sounds and intact distal pulses.  No murmur heard. Pulmonary/Chest: Effort normal and breath sounds normal. No respiratory distress.  Abdominal: Soft. Bowel sounds are normal. She exhibits no distension. There is no tenderness. There  is no guarding.  Left-sided CVA tenderness to percussion  Musculoskeletal: She exhibits no edema.  Neurological: She is alert.  Skin: Skin is warm and dry.  Psychiatric: She has a normal mood and affect. Her behavior is normal.  Nursing note and vitals reviewed.    ED Treatments / Results  Labs (all labs ordered are listed, but only abnormal results are displayed) Labs Reviewed  URINALYSIS, ROUTINE W REFLEX MICROSCOPIC - Abnormal; Notable for the following components:      Result Value   Hgb urine dipstick MODERATE (*)    RBC / HPF >50 (*)    Bacteria, UA RARE (*)    All other components within normal limits  CBC WITH DIFFERENTIAL/PLATELET - Abnormal; Notable for the following components:   Eosinophils Absolute 0.6 (*)    All other components within normal limits  COMPREHENSIVE METABOLIC PANEL - Abnormal; Notable for the following components:   Calcium 8.8 (*)    All other components within normal limits  URINE CULTURE  PREGNANCY, URINE    EKG None  Radiology US Renal  Result Date: 07/21/2018 CLINICAL DATA:  Left costovertebral angle tenderness. History of nephrolithiasis. EXAM: RENAL / URINARY TRACT ULTRASOUND COMPLETE COMPARISON:  05/28/2018 CT abdomen/pelvis. FINDINGS: Right Kidney: Renal measurements: 10.2 x 3.5 x 3.7 cm = volume: 133 mL. Echogenicity within normal limits. No mass or hydronephrosis visualized. No stones large enough to cause  acoustic shadowing. Left Kidney: Renal measurements: 9.5 x 4.8 x 4.9 = volume: 109 mL. Echogenicity within normal limits. No mass or hydronephrosis visualized. No stones large enough to cause acoustic shadowing. Bladder: Appears normal for degree of bladder distention. No convincing focal bladder abnormality. The small structure measured by the technologist in the left posterior bladder wall is favored to be artifactual upon review of the cine clip. IMPRESSION: Normal ultrasound of the kidneys and bladder. No hydronephrosis. No renal stones large enough to cause acoustic shadowing. Electronically Signed   By: Delbert Phenix M.D.   On: 07/21/2018 17:31    Procedures Procedures (including critical care time)  Medications Ordered in ED Medications  fentaNYL (SUBLIMAZE) injection 50 mcg (50 mcg Intravenous Given 07/21/18 1735)  ondansetron (ZOFRAN) injection 4 mg (4 mg Intravenous Given 07/21/18 1732)  sodium chloride 0.9 % bolus 500 mL (0 mLs Intravenous Stopped 07/21/18 1844)  oxyCODONE-acetaminophen (PERCOCET/ROXICET) 5-325 MG per tablet 1 tablet (1 tablet Oral Given 07/21/18 1850)   PMP reviewed. Fill DateIDWrittenDrugQtyDaysPrescriberRx #PharmacyRefillDaily Dose *Pymt Type PMP 07/17/2018  1  02/27/2018  Belsomra 10 Mg Tablet  30.00 30 Ja Kim  [id number]  [facility] ([id number])  2/5 0.20 LME Comm Ins  [place] 06/28/2018  1  06/28/2018  Oxycodone-Acetaminophen 10-325  150.00 30 Da Col  [id number]  [facility] ([id number])  0/0 75.00 MME Comm Ins  [place] 06/27/2018  1  04/26/2018  Clonazepam 0.5 Mg Tablet  60.00 30 Enos Fling  [id number]  [facility] ([id number])  1/2 2.00 LME Comm Ins  [place] 06/13/2018  1  02/27/2018  Belsomra 10 Mg Tablet  30.00 30 Ja Kim  [id number]  [facility] ([id number])  1/5 0.20 LME Comm Ins  [place] 05/30/2018  1  05/30/2018  Oxycodone-Acetaminophen 10-325  150.00 30 Da Col  [id number]  [facility] ([id number])  0/0 75.00 MME Comm Ins    Initial Impression / Assessment and Plan / ED Course  I have reviewed the triage  vital signs and the nursing notes.  Pertinent labs & imaging results that were available during my care of the patient were reviewed by  me and considered in my medical decision making (see chart for details).    Patient presents today for evaluation of concern for urinary tract infection.  She has left-sided CVA tenderness to percussion.  She has a previous history of kidney stones in addition to pyelonephritis.  He had a CT scan of her abdomen performed in September which did not show any evidence of renal stones at that time.  Even that lower suspicion for kidney stone.  Her pain was treated while in the department ultrasound was ordered which did not show any stones large enough to cause shadow.  Her urine showed moderate blood, over 50 red blood cells (she is not currently menstruating )with rare bacteria and 0-5 whites.  This is not clearly infected, therefore urine was sent for culture.  Will preemptively start treating patient with Keflex for presumed Pyelonephritis pending culture.  White count is not elevated, she is not tachycardic or tachypneic or febrile while in the department.  Pregnancy test is negative.    Return precautions were discussed with patient who states their understanding.  At the time of discharge patient denied any unaddressed complaints or concerns.  Patient is agreeable for discharge home.   PMP was reviewed for patient.  She received a 30-day supply of oxycodone 10 acetaminophen on 06/28/2018.  Therefore additional narcotic pain medicines at home were not prescribed  Final Clinical Impressions(s) / ED Diagnoses   Final diagnoses:  Pyelonephritis    ED Discharge Orders         Ordered    cephALEXin (KEFLEX) 500 MG capsule  2 times daily     07/21/18 1837           Cristina Gong, PA-C 07/22/18 0103    Terrilee Files, MD 07/22/18 1409

## 2018-07-21 NOTE — Discharge Instructions (Addendum)
Please take Ibuprofen (Advil, motrin) and Tylenol (acetaminophen) to relieve your pain.  You may take up to 600 MG (3 pills) of normal strength ibuprofen every 8 hours as needed.  In between doses of ibuprofen you make take tylenol, up to 1,000 mg (two extra strength pills).  Do not take more than 3,000 mg tylenol in a 24 hour period.  Please check all medication labels as many medications such as pain and cold medications may contain tylenol.  Do not drink alcohol while taking these medications.  Do not take other NSAID'S while taking ibuprofen (such as aleve or naproxen).  Please take ibuprofen with food to decrease stomach upset.  You may have diarrhea from the antibiotics.  It is very important that you continue to take the antibiotics even if you get diarrhea unless a medical professional tells you that you may stop taking them.  If you stop too early the bacteria you are being treated for will become stronger and you may need different, more powerful antibiotics that have more side effects and worsening diarrhea.  Please stay well hydrated and consider probiotics as they may decrease the severity of your diarrhea.  Please be aware that if you take any hormonal contraception (birth control pills, nexplanon, the ring, etc) that your birth control will not work while you are taking antibiotics and you need to use back up protection as directed on the birth control medication information insert.    Please continue to take your baseline narcotic medications.

## 2018-07-21 NOTE — ED Triage Notes (Signed)
PT reports that she has been having pain and burning with urination and reports some noted blood in urine. Pt is c/o rt sided flank pain

## 2018-07-22 ENCOUNTER — Telehealth: Payer: Self-pay | Admitting: Emergency Medicine

## 2018-07-22 LAB — URINE CULTURE: Culture: NO GROWTH

## 2018-07-22 NOTE — Telephone Encounter (Signed)
Lost to followup 

## 2018-07-27 DIAGNOSIS — Z79899 Other long term (current) drug therapy: Secondary | ICD-10-CM | POA: Insufficient documentation

## 2018-07-27 DIAGNOSIS — R109 Unspecified abdominal pain: Secondary | ICD-10-CM | POA: Diagnosis not present

## 2018-07-27 DIAGNOSIS — N739 Female pelvic inflammatory disease, unspecified: Secondary | ICD-10-CM | POA: Diagnosis not present

## 2018-07-27 DIAGNOSIS — F17210 Nicotine dependence, cigarettes, uncomplicated: Secondary | ICD-10-CM | POA: Diagnosis not present

## 2018-07-28 ENCOUNTER — Other Ambulatory Visit: Payer: Self-pay

## 2018-07-28 ENCOUNTER — Encounter (HOSPITAL_COMMUNITY): Payer: Self-pay | Admitting: *Deleted

## 2018-07-28 ENCOUNTER — Emergency Department (HOSPITAL_COMMUNITY): Payer: BLUE CROSS/BLUE SHIELD

## 2018-07-28 ENCOUNTER — Emergency Department (HOSPITAL_COMMUNITY)
Admission: EM | Admit: 2018-07-28 | Discharge: 2018-07-28 | Disposition: A | Discharge status: Home / Self Care | Payer: BLUE CROSS/BLUE SHIELD | Attending: Emergency Medicine | Admitting: Emergency Medicine

## 2018-07-28 DIAGNOSIS — R109 Unspecified abdominal pain: Secondary | ICD-10-CM | POA: Diagnosis not present

## 2018-07-28 DIAGNOSIS — N730 Acute parametritis and pelvic cellulitis: Secondary | ICD-10-CM

## 2018-07-28 LAB — BASIC METABOLIC PANEL
Anion gap: 11 (ref 5–15)
BUN: 9 mg/dL (ref 6–20)
CO2: 21 mmol/L — AB (ref 22–32)
Calcium: 8.9 mg/dL (ref 8.9–10.3)
Chloride: 104 mmol/L (ref 98–111)
Creatinine, Ser: 0.65 mg/dL (ref 0.44–1.00)
GFR calc Af Amer: >60 mL/min (ref >60)
GLUCOSE: 109 mg/dL — AB (ref 70–99)
Potassium: 3.7 mmol/L (ref 3.5–5.1)
Sodium: 136 mmol/L (ref 135–145)

## 2018-07-28 LAB — CBC WITH DIFFERENTIAL/PLATELET
Abs Immature Granulocytes: 0.02 10*3/uL (ref 0.00–0.07)
BASOS PCT: 1 %
Basophils Absolute: 0.1 10*3/uL (ref 0.0–0.1)
EOS ABS: 0.6 10*3/uL — AB (ref 0.0–0.5)
EOS PCT: 6 %
HEMATOCRIT: 41.1 % (ref 36.0–46.0)
Hemoglobin: 13.7 g/dL (ref 12.0–15.0)
Immature Granulocytes: 0 %
LYMPHS ABS: 3.7 10*3/uL (ref 0.7–4.0)
Lymphocytes Relative: 41 %
MCH: 31.1 pg (ref 26.0–34.0)
MCHC: 33.3 g/dL (ref 30.0–36.0)
MCV: 93.4 fL (ref 80.0–100.0)
MONOS PCT: 6 %
Monocytes Absolute: 0.5 10*3/uL (ref 0.1–1.0)
NRBC: 0 % (ref 0.0–0.2)
Neutro Abs: 4.3 10*3/uL (ref 1.7–7.7)
Neutrophils Relative %: 46 %
PLATELETS: 366 10*3/uL (ref 150–400)
RBC: 4.4 MIL/uL (ref 3.87–5.11)
RDW: 11.9 % (ref 11.5–15.5)
WBC: 9.2 10*3/uL (ref 4.0–10.5)

## 2018-07-28 LAB — URINALYSIS, ROUTINE W REFLEX MICROSCOPIC
Bilirubin Urine: NEGATIVE
Glucose, UA: NEGATIVE mg/dL
KETONES UR: NEGATIVE mg/dL
Nitrite: NEGATIVE
PROTEIN: NEGATIVE mg/dL
RBC / HPF: >50 RBC/hpf — ABNORMAL HIGH (ref 0–5)
Specific Gravity, Urine: 1.013 (ref 1.005–1.030)
pH: 7 (ref 5.0–8.0)

## 2018-07-28 LAB — POC URINE PREG, ED: PREG TEST UR: NEGATIVE

## 2018-07-28 LAB — WET PREP, GENITAL
CLUE CELLS WET PREP: NONE SEEN
Sperm: NONE SEEN
TRICH WET PREP: NONE SEEN
YEAST WET PREP: NONE SEEN

## 2018-07-28 MED ORDER — KETAMINE HCL 50 MG/5ML IJ SOSY
10.0000 mg | PREFILLED_SYRINGE | Freq: Once | INTRAMUSCULAR | Status: AC
  Administered 2018-07-28: 10 mg via INTRAVENOUS
  Filled 2018-07-28: qty 5

## 2018-07-28 MED ORDER — IBUPROFEN 800 MG PO TABS
800.0000 mg | ORAL_TABLET | Freq: Once | ORAL | Status: DC
  Filled 2018-07-28: qty 1

## 2018-07-28 MED ORDER — ACETAMINOPHEN 500 MG PO TABS
1000.0000 mg | ORAL_TABLET | Freq: Once | ORAL | Status: DC
  Filled 2018-07-28: qty 2

## 2018-07-28 MED ORDER — ONDANSETRON HCL 4 MG/2ML IJ SOLN: 4.0000 mg | Freq: Once | INTRAMUSCULAR | Status: DC

## 2018-07-28 MED ORDER — AZITHROMYCIN 250 MG PO TABS: 1000.0000 mg | ORAL_TABLET | Freq: Once | ORAL | Status: DC

## 2018-07-28 MED ORDER — SODIUM CHLORIDE 0.9 % IV SOLN
1.0000 g | Freq: Once | INTRAVENOUS | Status: AC
  Administered 2018-07-28: 1 g via INTRAVENOUS
  Filled 2018-07-28: qty 10

## 2018-07-28 MED ORDER — HYDROMORPHONE HCL 1 MG/ML IJ SOLN
0.5000 mg | Freq: Once | INTRAMUSCULAR | Status: AC
  Administered 2018-07-28: 0.5 mg via INTRAVENOUS
  Filled 2018-07-28: qty 1

## 2018-07-28 MED ORDER — ONDANSETRON HCL 4 MG/2ML IJ SOLN
4.0000 mg | Freq: Once | INTRAMUSCULAR | Status: AC
  Administered 2018-07-28: 4 mg via INTRAVENOUS
  Filled 2018-07-28: qty 2

## 2018-07-28 NOTE — ED Notes (Signed)
Urine culture sent to lab.

## 2018-07-28 NOTE — ED Notes (Signed)
Patient educated about not driving or performing other critical tasks (such as operating heavy machinery, caring for infant/toddler/child) due to sedative nature of narcotic medications received while in the ED.  Pt/caregiver verbalized understanding.   

## 2018-07-28 NOTE — ED Triage Notes (Signed)
Pt seen recently for kidney infection, has taken abx without relief, was supposed to come back in after a couple days if not feeling better.

## 2018-07-28 NOTE — ED Provider Notes (Signed)
[facility]-EMERGENCY DEPT Provider Note   CSN: [id number] Arrival date & time: 07/27/18  2332     History   Chief Complaint Chief Complaint  Patient presents with  . Flank Pain    left  . Dysuria    HPI Tonya Davidson is a 26 y.o. female.  27 yo F with a chief complaint of left flank pain.  This been going on since about 8 days ago.  She was seen in the ED about 7 days ago and diagnosed with pyelonephritis.  Was started on Keflex.  She had no improvement with Keflex and she took some Bactrim that she had at home.  Is continuing to have fevers as high as 102 at home nausea having trouble keeping things down.  She is having trouble sleeping as well.  Has a history of recurrent UTIs in the past.  The history is provided by the patient.  Flank Pain  This is a new problem. The current episode started more than 1 week ago. The problem occurs constantly. The problem has been gradually worsening. Associated symptoms include abdominal pain. Pertinent negatives include no chest pain, no headaches and no shortness of breath. The symptoms are aggravated by bending and twisting (urination). Nothing relieves the symptoms. She has tried nothing for the symptoms. The treatment provided no relief.  Dysuria   Associated symptoms include flank pain. Pertinent negatives include no chills, no nausea, no vomiting and no urgency.    Past Medical History:  Diagnosis Date  . Abnormal Pap smear    colposcopy  . Anemia   . Anxiety   . Arthritis   . Bipolar 1 disorder (HCC)   . C. difficile diarrhea   . Chlamydia 04/29/2012  . Depression   . Fibromyalgia   . HPV in female   . IBS (irritable bowel syndrome)   . Kidney stones   . Mononucleosis   . Pelvic pain in female 12/14/2014  . PID (acute pelvic inflammatory disease) 04/28/2012  . Ulcer     Patient Active Problem List   Diagnosis Date Noted  . Pyelonephritis 09/15/2016  . Right flank pain   . Pelvic pain in female  12/14/2014  . Major depressive disorder, recurrent episode, severe, without mention of psychotic behavior 10/03/2013  . GAD (generalized anxiety disorder) 09/30/2013  . PID (acute pelvic inflammatory disease) 04/28/2012  . ANXIETY 05/18/2009  . IBS 05/18/2009    Past Surgical History:  Procedure Laterality Date  . COLPOSCOPY    . INNER EAR SURGERY    . KIDNEY STONE SURGERY    . LAPAROSCOPIC CHOLECYSTECTOMY    . TONSILLECTOMY    . WISDOM TOOTH EXTRACTION       OB History    Gravida  0   Para      Term      Preterm      AB      Living        SAB      TAB      Ectopic      Multiple      Live Births               Home Medications    Prior to Admission medications   Medication Sig Start Date End Date Taking? Authorizing Provider  acetaminophen (TYLENOL) 500 MG tablet Take 1,500 mg by mouth every 6 (six) hours as needed for moderate pain.    [provider]  ARIPiprazole (ABILIFY) 2 MG tablet Take  1 mg by mouth daily.    [provider]  BELSOMRA 10 MG TABS Take 10 mg by mouth at bedtime. 01/16/18   [provider]  cephALEXin (KEFLEX) 500 MG capsule Take 1 capsule (500 mg total) by mouth 2 (two) times daily for 14 days. 07/21/18 08/04/18  Cristina Gong, PA-C  clonazePAM (KLONOPIN) 0.5 MG tablet Take 0.5 mg by mouth 2 (two) times daily. 12/31/17   [provider]  cyclobenzaprine (FLEXERIL) 5 MG tablet Take 1 tablet (5 mg total) 2 (two) times daily as needed by mouth for muscle spasms. Patient not taking: Reported on 01/18/2018 08/05/17   Horton, Mayer Masker, MD  drospirenone-ethinyl estradiol (YAZ,GIANVI,LORYNA) 3-0.02 MG tablet Take 1 tablet by mouth daily.    [provider]  ibuprofen (ADVIL,MOTRIN) 200 MG tablet Take 800 mg by mouth every 6 (six) hours as needed for moderate pain.    [provider]  ibuprofen (ADVIL,MOTRIN) 600 MG tablet Take 1 tablet (600 mg total) every 6 (six) hours as needed by  mouth. Patient not taking: Reported on 01/18/2018 08/05/17   Horton, Mayer Masker, MD  lidocaine (XYLOCAINE) 2 % jelly Apply 1 application topically as needed. Patient not taking: Reported on 07/21/2018 06/23/18   Elisha Ponder, PA-C  nitrofurantoin, macrocrystal-monohydrate, (MACROBID) 100 MG capsule Take 1 capsule (100 mg total) by mouth 2 (two) times daily. Patient not taking: Reported on 01/18/2018 04/28/17   Phillis Haggis, MD  ondansetron (ZOFRAN ODT) 4 MG disintegrating tablet Take 1 tablet (4 mg total) by mouth every 8 (eight) hours as needed for nausea or vomiting. Patient not taking: Reported on 01/18/2018 04/28/17   Phillis Haggis, MD  oxyCODONE-acetaminophen (PERCOCET/ROXICET) 5-325 MG tablet Take 1-2 tablets by mouth every 6 (six) hours as needed for severe pain. Patient not taking: Reported on 01/18/2018 04/28/17   Phillis Haggis, MD  sertraline (ZOLOFT) 25 MG tablet Take 12.5 mg by mouth daily.  01/06/18   [provider]    Family History Family History  Problem Relation Age of Onset  . Bipolar disorder Mother   . Cancer Father   . ADD / ADHD Brother   . Cancer Maternal Grandmother   . Cancer Maternal Grandfather   . Cancer Paternal Grandmother   . Cancer Paternal Grandfather     Social History Social History   Tobacco Use  . Smoking status: Current Every Day Smoker    Packs/day: 0.50    Years: 5.00    Pack years: 2.50    Types: Cigarettes  . Smokeless tobacco: Never Used  Substance Use Topics  . Alcohol use: No  . Drug use: Yes    Types: Marijuana    Comment: quit in June 2015 per patient      Allergies   Azithromycin; Doxycycline; Prednisone; Bactrim [sulfamethoxazole-trimethoprim]; Flagyl [metronidazole]; Naproxen; Toradol [ketorolac tromethamine]; and Morphine and related   Review of Systems Review of Systems  Constitutional: Negative for chills and fever.  HENT: Negative for congestion and rhinorrhea.   Eyes: Negative for redness and visual  disturbance.  Respiratory: Negative for shortness of breath and wheezing.   Cardiovascular: Negative for chest pain and palpitations.  Gastrointestinal: Positive for abdominal pain. Negative for nausea and vomiting.  Genitourinary: Positive for dysuria and flank pain. Negative for urgency.  Musculoskeletal: Negative for arthralgias and myalgias.  Skin: Negative for pallor and wound.  Neurological: Negative for dizziness and headaches.     Physical Exam Updated Vital Signs BP 104/72  Pulse 91   Temp 98.4 F (36.9 C) (Oral)   Resp (!) 21   Ht 5\' 4"  (1.626 m)   Wt 62.6 kg   LMP 07/07/2018 (Approximate)   SpO2 96%   BMI 23.69 kg/m   Physical Exam  Constitutional: She is oriented to person, place, and time. She appears well-developed and well-nourished. No distress.  HENT:  Head: Normocephalic and atraumatic.  Eyes: Pupils are equal, round, and reactive to light. EOM are normal.  Neck: Normal range of motion. Neck supple.  Cardiovascular: Normal rate and regular rhythm. Exam reveals no gallop and no friction rub.  No murmur heard. Pulmonary/Chest: Effort normal. She has no wheezes. She has no rales.  Abdominal: Soft. She exhibits no distension and no mass. There is tenderness (LLQ). There is no guarding.  Flank pain to percussion to the left CVA  Genitourinary:    Musculoskeletal: She exhibits no edema or tenderness.  Neurological: She is alert and oriented to person, place, and time.  Skin: Skin is warm and dry. She is not diaphoretic.  Psychiatric: She has a normal mood and affect. Her behavior is normal.  Nursing note and vitals reviewed.    ED Treatments / Results  Labs (all labs ordered are listed, but only abnormal results are displayed) Labs Reviewed  WET PREP, GENITAL - Abnormal; Notable for the following components:      Result Value   WBC, Wet Prep HPF POC MANY (*)    All other components within normal limits  URINALYSIS, ROUTINE W REFLEX MICROSCOPIC -  Abnormal; Notable for the following components:   Hgb urine dipstick LARGE (*)    Leukocytes, UA LARGE (*)    RBC / HPF >50 (*)    Bacteria, UA MANY (*)    All other components within normal limits  CBC WITH DIFFERENTIAL/PLATELET - Abnormal; Notable for the following components:   Eosinophils Absolute 0.6 (*)    All other components within normal limits  BASIC METABOLIC PANEL - Abnormal; Notable for the following components:   CO2 21 (*)    Glucose, Bld 109 (*)    All other components within normal limits  URINE CULTURE  POC URINE PREG, ED  GC/CHLAMYDIA PROBE AMP ([place]) NOT AT [facility]    EKG None  Radiology Ct Renal Stone Study  Result Date: 07/28/2018 CLINICAL DATA:  Flank pain. History of kidney infection not better on antibiotics. EXAM: CT ABDOMEN AND PELVIS WITHOUT CONTRAST TECHNIQUE: Multidetector CT imaging of the abdomen and pelvis was performed following the standard protocol without IV contrast. COMPARISON:  05/28/2018 FINDINGS: Lower chest: Patchy mosaic attenuation pattern could be due to reactive airways disease, respiratory bronchiolitis or hypersensitivity pneumonitis. No focal airspace consolidation or pleural effusion. The heart is normal in size. Hepatobiliary: No focal hepatic lesions or intrahepatic biliary dilatation. The gallbladder is surgically absent. No common bile duct dilatation. Pancreas: No mass, inflammation or ductal dilatation. Spleen: Normal size. No focal lesions. Adrenals/Urinary Tract: The adrenal glands and kidneys are unremarkable. No renal, ureteral or bladder calculi or mass. Stomach/Bowel: The stomach, duodenum, small bowel and colon are grossly normal without oral contrast. No inflammatory changes, mass lesions or obstructive findings. The terminal ileum and appendix are normal. Vascular/Lymphatic: The aorta is normal in caliber. No atheroscerlotic calcifications. No mesenteric of retroperitoneal mass or adenopathy. Small scattered lymph nodes  are noted. Reproductive: The uterus and ovaries are unremarkable. Other: No pelvic mass or adenopathy. No free pelvic fluid collections. No inguinal mass or adenopathy. No  abdominal wall hernia or subcutaneous lesions. Musculoskeletal: No significant bony findings. IMPRESSION: 1. No acute abdominal/pelvic findings, mass lesions or adenopathy. 2. No renal, ureteral or bladder calculi or mass. 3. Status post cholecystectomy. No biliary dilatation. Electronically Signed   By: Rudie Meyer M.D.   On: 07/28/2018 02:45    Procedures Procedures (including critical care time)  Medications Ordered in ED Medications  acetaminophen (TYLENOL) tablet 1,000 mg (1,000 mg Oral Refused 07/28/18 0229)  ibuprofen (ADVIL,MOTRIN) tablet 800 mg (800 mg Oral Refused 07/28/18 0229)  azithromycin (ZITHROMAX) tablet 1,000 mg (1,000 mg Oral Refused 07/28/18 0439)  ondansetron (ZOFRAN) injection 4 mg (4 mg Intravenous Refused 07/28/18 0439)  ondansetron (ZOFRAN) injection 4 mg (4 mg Intravenous Given 07/28/18 0229)  cefTRIAXone (ROCEPHIN) 1 g in sodium chloride 0.9 % 100 mL IVPB (0 g Intravenous Stopped 07/28/18 0337)  ketamine 50 mg in normal saline 5 mL (10 mg/mL) syringe (10 mg Intravenous Given 07/28/18 0250)  HYDROmorphone (DILAUDID) injection 0.5 mg (0.5 mg Intravenous Given 07/28/18 0440)     Initial Impression / Assessment and Plan / ED Course  I have reviewed the triage vital signs and the nursing notes.  Pertinent labs & imaging results that were available during my care of the patient were reviewed by me and considered in my medical decision making (see chart for details).     27 yo F with a chief complaint of likely pyelonephritis.  Patient was recently diagnosed with a urinary tract infection with flank pain she was treated with Keflex.  She is worsened since then.  Still having fevers at home.  Afebrile here and well-appearing though the patient is complaining of terrible pain.  UA here with large  leukocyte esterase too numerous to count bacteria.  Large blood.  He does appear to be contaminated with a moderate number of squamous cells.  Will obtain a CT stone study.  Will check lab work give a dose of Rocephin.   Upon reviewing the patient's records her last UA was not consistent with infection.  Her culture was also negative.  Looking back at her old charts it seems she has had an issue with recurrent pelvic inflammatory disease.  Pelvic exam with exquisite tenderness, purulent drainage from the cervix positive chandelier sign.  She unfortunately has an intolerance to doxycycline as well as azithromycin.  I discussed with her at length that this is likely required for full treatment.  At this time she is declining either of those medications.  I suggested that she follow-up with OB/GYN as she is seen them in the past for this issue.  As the patient is having reported fevers at home and concern for pelvic inflammatory disease I discussed the stone study with Dr. Cherly Hensen who was in the radiology reading room.  He personally reviewed the images and did not feel that a contrasted study would add anything.  He felt it was negative for tubo-ovarian abscess in particular.  The patient was given ketamine for pain because she has had what looks like issues with narcotics in the past and is currently being seen by pain management clinic.  The patient asked multiple times specifically for Dilaudid, I am wondering if part of her reason for the visit was more narcotic seeking.  When I brought up that she sees the pain clinic she lied to me and said that she is only seen him once when I can see in the record that she has seen them much more often.   4:59  AM:  I have discussed the diagnosis/risks/treatment options with the patient and family and believe the pt to be eligible for discharge home to follow-up with GYN, PCP, pain managment. We also discussed returning to the ED immediately if new or worsening sx occur.  We discussed the sx which are most concerning (e.g., sudden worsening pain, fever, inability to tolerate by mouth) that necessitate immediate return. Medications administered to the patient during their visit and any new prescriptions provided to the patient are listed below.  Medications given during this visit Medications  acetaminophen (TYLENOL) tablet 1,000 mg (1,000 mg Oral Refused 07/28/18 0229)  ibuprofen (ADVIL,MOTRIN) tablet 800 mg (800 mg Oral Refused 07/28/18 0229)  azithromycin (ZITHROMAX) tablet 1,000 mg (1,000 mg Oral Refused 07/28/18 0439)  ondansetron (ZOFRAN) injection 4 mg (4 mg Intravenous Refused 07/28/18 0439)  ondansetron (ZOFRAN) injection 4 mg (4 mg Intravenous Given 07/28/18 0229)  cefTRIAXone (ROCEPHIN) 1 g in sodium chloride 0.9 % 100 mL IVPB (0 g Intravenous Stopped 07/28/18 0337)  ketamine 50 mg in normal saline 5 mL (10 mg/mL) syringe (10 mg Intravenous Given 07/28/18 0250)  HYDROmorphone (DILAUDID) injection 0.5 mg (0.5 mg Intravenous Given 07/28/18 0440)      The patient appears reasonably screen and/or stabilized for discharge and I doubt any other medical condition or other [facility] requiring further screening, evaluation, or treatment in the ED at this time prior to discharge.      Final Clinical Impressions(s) / ED Diagnoses   Final diagnoses:  PID (acute pelvic inflammatory disease)    ED Discharge Orders    None       Melene Plan, DO 07/28/18 [phone]

## 2018-07-29 LAB — URINE CULTURE

## 2018-07-29 LAB — GC/CHLAMYDIA PROBE AMP (~~LOC~~) NOT AT ARMC
CHLAMYDIA, DNA PROBE: NEGATIVE
Neisseria Gonorrhea: NEGATIVE

## 2018-07-30 DIAGNOSIS — M79609 Pain in unspecified limb: Secondary | ICD-10-CM | POA: Diagnosis not present

## 2018-07-30 DIAGNOSIS — M545 Low back pain: Secondary | ICD-10-CM | POA: Diagnosis not present

## 2018-08-02 ENCOUNTER — Ambulatory Visit
Admission: RE | Admit: 2018-08-02 | Discharge: 2018-08-02 | Disposition: A | Discharge status: Home / Self Care | Payer: BLUE CROSS/BLUE SHIELD | Source: Ambulatory Visit | Attending: Pain Medicine | Admitting: Pain Medicine

## 2018-08-02 DIAGNOSIS — R292 Abnormal reflex: Secondary | ICD-10-CM

## 2018-08-02 DIAGNOSIS — M542 Cervicalgia: Secondary | ICD-10-CM | POA: Diagnosis not present

## 2018-08-02 DIAGNOSIS — M546 Pain in thoracic spine: Secondary | ICD-10-CM | POA: Diagnosis not present

## 2018-08-05 ENCOUNTER — Other Ambulatory Visit: Payer: Self-pay

## 2018-08-05 ENCOUNTER — Encounter (HOSPITAL_COMMUNITY): Payer: Self-pay | Admitting: Emergency Medicine

## 2018-08-05 DIAGNOSIS — R300 Dysuria: Secondary | ICD-10-CM | POA: Insufficient documentation

## 2018-08-05 DIAGNOSIS — R112 Nausea with vomiting, unspecified: Secondary | ICD-10-CM | POA: Insufficient documentation

## 2018-08-05 DIAGNOSIS — Z79899 Other long term (current) drug therapy: Secondary | ICD-10-CM | POA: Insufficient documentation

## 2018-08-05 DIAGNOSIS — F17210 Nicotine dependence, cigarettes, uncomplicated: Secondary | ICD-10-CM | POA: Insufficient documentation

## 2018-08-05 DIAGNOSIS — R109 Unspecified abdominal pain: Secondary | ICD-10-CM | POA: Insufficient documentation

## 2018-08-05 DIAGNOSIS — M545 Low back pain: Secondary | ICD-10-CM | POA: Diagnosis not present

## 2018-08-05 LAB — POC URINE PREG, ED: PREG TEST UR: NEGATIVE

## 2018-08-05 NOTE — ED Triage Notes (Signed)
Patient c/o left lower back pain, dysuria, intermittent fever, and N/V x 1 month. Seen for same x2 this month.

## 2018-08-06 ENCOUNTER — Emergency Department (HOSPITAL_COMMUNITY)
Admission: EM | Admit: 2018-08-06 | Discharge: 2018-08-06 | Disposition: A | Discharge status: Home / Self Care | Payer: BLUE CROSS/BLUE SHIELD | Attending: Emergency Medicine | Admitting: Emergency Medicine

## 2018-08-06 DIAGNOSIS — R109 Unspecified abdominal pain: Secondary | ICD-10-CM

## 2018-08-06 LAB — URINALYSIS, ROUTINE W REFLEX MICROSCOPIC
Bilirubin Urine: NEGATIVE
Glucose, UA: NEGATIVE mg/dL
Ketones, ur: NEGATIVE mg/dL
Nitrite: NEGATIVE
Protein, ur: NEGATIVE mg/dL
RBC / HPF: >50 RBC/hpf — ABNORMAL HIGH (ref 0–5)
SPECIFIC GRAVITY, URINE: 1.018 (ref 1.005–1.030)
pH: 5 (ref 5.0–8.0)

## 2018-08-06 MED ORDER — HYDROMORPHONE HCL 1 MG/ML IJ SOLN
1.0000 mg | Freq: Once | INTRAMUSCULAR | Status: AC
  Administered 2018-08-06: 1 mg via INTRAVENOUS
  Filled 2018-08-06: qty 1

## 2018-08-06 MED ORDER — SODIUM CHLORIDE 0.9 % IV BOLUS
1000.0000 mL | Freq: Once | INTRAVENOUS | Status: AC
  Administered 2018-08-06: 1000 mL via INTRAVENOUS

## 2018-08-06 MED ORDER — ONDANSETRON HCL 4 MG/2ML IJ SOLN
4.0000 mg | Freq: Once | INTRAMUSCULAR | Status: AC
  Administered 2018-08-06: 4 mg via INTRAVENOUS
  Filled 2018-08-06: qty 2

## 2018-08-06 NOTE — ED Notes (Signed)
Patient refused vital signs 

## 2018-08-06 NOTE — ED Provider Notes (Addendum)
[facility]-EMERGENCY DEPT Provider Note   CSN: [id number] Arrival date & time: 08/05/18  2209     History   Chief Complaint Chief Complaint  Patient presents with  . Dysuria  . Back Pain    HPI Tonya Davidson is a 27 y.o. female.  Patient presents to the emergency department for evaluation of left lower back pain.  Patient reports that this is been ongoing for a month.  She has been seen in the ER multiple times and treated with several different antibiotics for possible UTI.  Patient reports that she has had nausea and vomiting associated with the pain.  Tonight she vomited several times and one time there was blood in her vomit.     Past Medical History:  Diagnosis Date  . Abnormal Pap smear    colposcopy  . Anemia   . Anxiety   . Arthritis   . Bipolar 1 disorder (HCC)   . C. difficile diarrhea   . Chlamydia 04/29/2012  . Depression   . Fibromyalgia   . HPV in female   . IBS (irritable bowel syndrome)   . Kidney stones   . Mononucleosis   . Pelvic pain in female 12/14/2014  . PID (acute pelvic inflammatory disease) 04/28/2012  . Ulcer     Patient Active Problem List   Diagnosis Date Noted  . Pyelonephritis 09/15/2016  . Right flank pain   . Pelvic pain in female 12/14/2014  . Major depressive disorder, recurrent episode, severe, without mention of psychotic behavior 10/03/2013  . GAD (generalized anxiety disorder) 09/30/2013  . PID (acute pelvic inflammatory disease) 04/28/2012  . ANXIETY 05/18/2009  . IBS 05/18/2009    Past Surgical History:  Procedure Laterality Date  . COLPOSCOPY    . INNER EAR SURGERY    . KIDNEY STONE SURGERY    . LAPAROSCOPIC CHOLECYSTECTOMY    . TONSILLECTOMY    . WISDOM TOOTH EXTRACTION       OB History    Gravida  0   Para      Term      Preterm      AB      Living        SAB      TAB      Ectopic      Multiple      Live Births               Home Medications    Prior to  Admission medications   Medication Sig Start Date End Date Taking? Authorizing Provider  acetaminophen (TYLENOL) 500 MG tablet Take 1,500 mg by mouth every 6 (six) hours as needed for moderate pain.    [provider]  ARIPiprazole (ABILIFY) 2 MG tablet Take 1 mg by mouth daily.    [provider]  BELSOMRA 10 MG TABS Take 10 mg by mouth at bedtime. 01/16/18   [provider]  clonazePAM (KLONOPIN) 0.5 MG tablet Take 0.5 mg by mouth 2 (two) times daily. 12/31/17   [provider]  cyclobenzaprine (FLEXERIL) 5 MG tablet Take 1 tablet (5 mg total) 2 (two) times daily as needed by mouth for muscle spasms. Patient not taking: Reported on 01/18/2018 08/05/17   Horton, Mayer Maskerourtney F, MD  drospirenone-ethinyl estradiol (YAZ,GIANVI,LORYNA) 3-0.02 MG tablet Take 1 tablet by mouth daily.    [provider]  ibuprofen (ADVIL,MOTRIN) 200 MG tablet Take 800 mg by mouth every 6 (six) hours as needed for moderate pain.  [provider]  ibuprofen (ADVIL,MOTRIN) 600 MG tablet Take 1 tablet (600 mg total) every 6 (six) hours as needed by mouth. Patient not taking: Reported on 01/18/2018 08/05/17   Horton, Mayer Masker, MD  lidocaine (XYLOCAINE) 2 % jelly Apply 1 application topically as needed. Patient not taking: Reported on 07/21/2018 06/23/18   Elisha Ponder, PA-C  nitrofurantoin, macrocrystal-monohydrate, (MACROBID) 100 MG capsule Take 1 capsule (100 mg total) by mouth 2 (two) times daily. Patient not taking: Reported on 01/18/2018 04/28/17   Phillis Haggis, MD  ondansetron (ZOFRAN ODT) 4 MG disintegrating tablet Take 1 tablet (4 mg total) by mouth every 8 (eight) hours as needed for nausea or vomiting. Patient not taking: Reported on 01/18/2018 04/28/17   Phillis Haggis, MD  oxyCODONE-acetaminophen (PERCOCET/ROXICET) 5-325 MG tablet Take 1-2 tablets by mouth every 6 (six) hours as needed for severe pain. Patient not taking: Reported on 01/18/2018 04/28/17   Phillis Haggis,  MD  sertraline (ZOLOFT) 25 MG tablet Take 12.5 mg by mouth daily.  01/06/18   [provider]    Family History Family History  Problem Relation Age of Onset  . Bipolar disorder Mother   . Cancer Father   . ADD / ADHD Brother   . Cancer Maternal Grandmother   . Cancer Maternal Grandfather   . Cancer Paternal Grandmother   . Cancer Paternal Grandfather     Social History Social History   Tobacco Use  . Smoking status: Current Every Day Smoker    Packs/day: 0.50    Years: 5.00    Pack years: 2.50    Types: Cigarettes  . Smokeless tobacco: Never Used  Substance Use Topics  . Alcohol use: No  . Drug use: Yes    Types: Marijuana    Comment: quit in June 2015 per patient      Allergies   Azithromycin; Doxycycline; Prednisone; Bactrim [sulfamethoxazole-trimethoprim]; Flagyl [metronidazole]; Naproxen; Toradol [ketorolac tromethamine]; and Morphine and related   Review of Systems Review of Systems  Gastrointestinal: Positive for nausea and vomiting.  Genitourinary: Positive for flank pain.  All other systems reviewed and are negative.    Physical Exam Updated Vital Signs BP 110/90 (BP Location: Left Arm)   Pulse (!) 114   Temp 98.9 F (37.2 C) (Oral)   Resp 18   LMP 07/07/2018 (Approximate)   SpO2 100%   Physical Exam  Constitutional: She is oriented to person, place, and time. She appears well-developed and well-nourished. No distress.  HENT:  Head: Normocephalic and atraumatic.  Right Ear: Hearing normal.  Left Ear: Hearing normal.  Nose: Nose normal.  Mouth/Throat: Oropharynx is clear and moist and mucous membranes are normal.  Eyes: Pupils are equal, round, and reactive to light. Conjunctivae and EOM are normal.  Neck: Normal range of motion. Neck supple.  Cardiovascular: Regular rhythm, S1 normal and S2 normal. Exam reveals no gallop and no friction rub.  No murmur heard. Pulmonary/Chest: Effort normal and breath sounds normal. No respiratory  distress. She exhibits no tenderness.  Abdominal: Soft. Normal appearance and bowel sounds are normal. There is no hepatosplenomegaly. There is no tenderness. There is no rebound, no guarding, no tenderness at McBurney's point and negative Murphy's sign. No hernia.  Musculoskeletal: Normal range of motion.       Lumbar back: She exhibits tenderness.       Back:  Neurological: She is alert and oriented to person, place, and time. She has normal strength. No cranial nerve deficit or  sensory deficit. Coordination normal. GCS eye subscore is 4. GCS verbal subscore is 5. GCS motor subscore is 6.  Skin: Skin is warm, dry and intact. No rash noted. No cyanosis.  Psychiatric: She has a normal mood and affect. Her speech is normal and behavior is normal. Thought content normal.  Nursing note and vitals reviewed.    ED Treatments / Results  Labs (all labs ordered are listed, but only abnormal results are displayed) Labs Reviewed  URINALYSIS, ROUTINE W REFLEX MICROSCOPIC - Abnormal; Notable for the following components:      Result Value   APPearance HAZY (*)    Hgb urine dipstick LARGE (*)    Leukocytes, UA SMALL (*)    RBC / HPF >50 (*)    Bacteria, UA RARE (*)    Crystals PRESENT (*)    All other components within normal limits  POC URINE PREG, ED    EKG None  Radiology No results found.  Procedures Procedures (including critical care time)  Medications Ordered in ED Medications  sodium chloride 0.9 % bolus 1,000 mL (has no administration in time range)  HYDROmorphone (DILAUDID) injection 1 mg (has no administration in time range)  ondansetron (ZOFRAN) injection 4 mg (has no administration in time range)     Initial Impression / Assessment and Plan / ED Course  I have reviewed the triage vital signs and the nursing notes.  Pertinent labs & imaging results that were available during my care of the patient were reviewed by me and considered in my medical decision making (see  chart for details).     Patient presents to the emergency department for evaluation of flank pain.  This is her fifth visit since September 10 for this same complaint.  Patient has been treated for possible PID, pyelonephritis without any improvement.  She had a renal ultrasound on November 3 and a renal CT on November 10, neither of which showed obstruction, hydronephrosis or renal stones.  Reviewing her records reveals that she has been suspected to have interstitial cystitis.  She was seen by Dr. Logan Bores, an IC specialist at [facility] 1 year ago.  She never followed up.  Patient tells me that she is currently being worked up for the possibility of MS.  Because of this, her pain management specialist has stopped all of her pain meds.  I suspect that her increase in ER frequency is secondary to the removal of her chronic pain medications.  She has a known chronic pain syndrome.    Patient reports nausea and vomiting today.  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  Patient administered IV fluids and antiemetics.  ADL was made with her for 1 dose of pain medication, no outpatient pain medication to be prescribed.  She was counseled that she needs to follow-up with Dr. Logan Bores and she reports that she has an appointment next month.  Addendum: After patient received IV Dilaudid and Zofran she began to request ketamine.  This is concerning behavior and a former pain management patient who is no longer receiving opioid analgesics as an outpatient.  Patient probably should not receive any further analgesia in the ER for this complaint.  Final Clinical Impressions(s) / ED Diagnoses   Final diagnoses:  Flank pain    ED Discharge Orders    None       Lowry Bala, Canary Brim, MD 08/06/18 0246    Gilda Crease, MD 08/06/18 [phone]

## 2018-08-06 NOTE — Discharge Instructions (Signed)
This pain is a chronic pain problem.  You need to see your specialist at [facility] for any further treatment of your chronic pain.

## 2018-08-12 DIAGNOSIS — M79604 Pain in right leg: Secondary | ICD-10-CM | POA: Diagnosis not present

## 2018-08-12 DIAGNOSIS — G894 Chronic pain syndrome: Secondary | ICD-10-CM | POA: Diagnosis not present

## 2018-08-12 DIAGNOSIS — R200 Anesthesia of skin: Secondary | ICD-10-CM | POA: Diagnosis not present

## 2018-08-12 DIAGNOSIS — R292 Abnormal reflex: Secondary | ICD-10-CM | POA: Diagnosis not present

## 2018-09-27 ENCOUNTER — Emergency Department (HOSPITAL_COMMUNITY): Payer: Self-pay

## 2018-09-27 ENCOUNTER — Emergency Department (HOSPITAL_COMMUNITY)
Admission: EM | Admit: 2018-09-27 | Discharge: 2018-09-27 | Disposition: A | Discharge status: Home / Self Care | Payer: Self-pay | Attending: Emergency Medicine | Admitting: Emergency Medicine

## 2018-09-27 ENCOUNTER — Other Ambulatory Visit: Payer: Self-pay

## 2018-09-27 ENCOUNTER — Encounter (HOSPITAL_COMMUNITY): Payer: Self-pay

## 2018-09-27 DIAGNOSIS — Z79899 Other long term (current) drug therapy: Secondary | ICD-10-CM | POA: Insufficient documentation

## 2018-09-27 DIAGNOSIS — N390 Urinary tract infection, site not specified: Secondary | ICD-10-CM | POA: Insufficient documentation

## 2018-09-27 DIAGNOSIS — F17210 Nicotine dependence, cigarettes, uncomplicated: Secondary | ICD-10-CM | POA: Insufficient documentation

## 2018-09-27 DIAGNOSIS — R1031 Right lower quadrant pain: Secondary | ICD-10-CM | POA: Insufficient documentation

## 2018-09-27 HISTORY — DX: Other chronic pain: G89.29

## 2018-09-27 HISTORY — DX: Interstitial cystitis (chronic) without hematuria: N30.10

## 2018-09-27 LAB — URINALYSIS, ROUTINE W REFLEX MICROSCOPIC
Bilirubin Urine: NEGATIVE
GLUCOSE, UA: NEGATIVE mg/dL
Ketones, ur: NEGATIVE mg/dL
Nitrite: POSITIVE — AB
PROTEIN: NEGATIVE mg/dL
Specific Gravity, Urine: 1.013 (ref 1.005–1.030)
WBC, UA: >50 WBC/hpf — ABNORMAL HIGH (ref 0–5)
pH: 6 (ref 5.0–8.0)

## 2018-09-27 LAB — PREGNANCY, URINE: Preg Test, Ur: NEGATIVE

## 2018-09-27 MED ORDER — CEPHALEXIN 500 MG PO CAPS: 500.0000 mg | ORAL_CAPSULE | Freq: Four times a day (QID) | ORAL | 0 refills | Status: DC

## 2018-09-27 MED ORDER — PROMETHAZINE HCL 25 MG PO TABS: 25.0000 mg | ORAL_TABLET | Freq: Four times a day (QID) | ORAL | 0 refills | Status: DC | PRN

## 2018-09-27 MED ORDER — NITROFURANTOIN MONOHYD MACRO 100 MG PO CAPS: 100.0000 mg | ORAL_CAPSULE | Freq: Two times a day (BID) | ORAL | 0 refills | Status: DC

## 2018-09-27 MED ORDER — PROMETHAZINE HCL 25 MG/ML IJ SOLN
25.0000 mg | Freq: Once | INTRAMUSCULAR | Status: DC
  Filled 2018-09-27: qty 1

## 2018-09-27 MED ORDER — CEPHALEXIN 500 MG PO CAPS
500.0000 mg | ORAL_CAPSULE | Freq: Once | ORAL | Status: DC
  Filled 2018-09-27: qty 1

## 2018-09-27 MED ORDER — ACETAMINOPHEN 325 MG PO TABS
650.0000 mg | ORAL_TABLET | Freq: Once | ORAL | Status: DC
  Filled 2018-09-27: qty 2

## 2018-09-27 MED ORDER — NITROFURANTOIN MONOHYD MACRO 100 MG PO CAPS
100.0000 mg | ORAL_CAPSULE | ORAL | Status: DC
  Filled 2018-09-27: qty 1

## 2018-09-27 NOTE — ED Notes (Signed)
Pt refused Keflex, requests Macrobid.

## 2018-09-27 NOTE — ED Notes (Signed)
Pt refused CT Scan 

## 2018-09-27 NOTE — ED Notes (Signed)
Pt requesting IV.

## 2018-09-27 NOTE — ED Notes (Signed)
Pt asking if she is going to get an IV for blood work, pain medication, and nausea medication.

## 2018-09-27 NOTE — ED Provider Notes (Signed)
[facility]-EMERGENCY DEPT Provider Note   CSN: [id number] Arrival date & time: 09/27/18  2016     History   Chief Complaint Chief Complaint  Patient presents with  . Flank Pain    HPI Tonya Davidson is a 28 y.o. female.   Flank Pain     Pt was seen at 2110. Per pt, c/o gradual onset and persistence of constant dysuria for the past 3 days. Pt states she has felt like she has felt like she has had a fever for the past 8 days. Has been associated with several episodes of N/V for the past 4 days. Denies abd pain, no diarrhea, no vaginal bleeding/discharge, no rash, no CP/SOB, no cough. The symptoms have been associated with no other complaints. The patient has a significant history of similar symptoms previously, recently being evaluated for this complaint and multiple prior evals for same.      Past Medical History:  Diagnosis Date  . Abnormal Pap smear    colposcopy  . Anemia   . Anxiety   . Arthritis   . Bipolar 1 disorder (HCC)   . C. difficile diarrhea   . Chlamydia 04/29/2012  . Chronic pain   . Depression   . Fibromyalgia   . HPV in female   . IBS (irritable bowel syndrome)   . Interstitial cystitis   . Kidney stones   . Mononucleosis   . Pelvic pain in female 12/14/2014  . PID (acute pelvic inflammatory disease) 04/28/2012  . Ulcer     Patient Active Problem List   Diagnosis Date Noted  . Pyelonephritis 09/15/2016  . Right flank pain   . Pelvic pain in female 12/14/2014  . Major depressive disorder, recurrent episode, severe, without mention of psychotic behavior 10/03/2013  . GAD (generalized anxiety disorder) 09/30/2013  . PID (acute pelvic inflammatory disease) 04/28/2012  . ANXIETY 05/18/2009  . IBS 05/18/2009    Past Surgical History:  Procedure Laterality Date  . COLPOSCOPY    . INNER EAR SURGERY    . KIDNEY STONE SURGERY    . LAPAROSCOPIC CHOLECYSTECTOMY    . TONSILLECTOMY    . WISDOM TOOTH EXTRACTION       OB  History    Gravida  0   Para      Term      Preterm      AB      Living        SAB      TAB      Ectopic      Multiple      Live Births               Home Medications    Prior to Admission medications   Medication Sig Start Date End Date Taking? Authorizing Provider  ARIPiprazole (ABILIFY) 2 MG tablet Take 1 mg by mouth daily.   Yes [provider]  clonazePAM (KLONOPIN) 0.5 MG tablet Take 0.5 mg by mouth 2 (two) times daily. 12/31/17  Yes [provider]  drospirenone-ethinyl estradiol (YAZ,GIANVI,LORYNA) 3-0.02 MG tablet Take 1 tablet by mouth daily.   Yes [provider]  ibuprofen (ADVIL,MOTRIN) 200 MG tablet Take 400 mg by mouth every 6 (six) hours as needed for moderate pain.    Yes [provider]  mirtazapine (REMERON) 15 MG tablet Take 15 mg by mouth at bedtime.  07/23/18  Yes [provider]  sertraline (ZOLOFT) 25 MG tablet Take 12.5 mg by mouth daily.  01/06/18  Yes [provider]  acetaminophen (TYLENOL) 500 MG tablet Take 1,500 mg by mouth every 6 (six) hours as needed for moderate pain.    [provider]  cyclobenzaprine (FLEXERIL) 5 MG tablet Take 1 tablet (5 mg total) 2 (two) times daily as needed by mouth for muscle spasms. Patient not taking: Reported on 01/18/2018 08/05/17   Horton, Mayer Maskerourtney F, MD  ibuprofen (ADVIL,MOTRIN) 600 MG tablet Take 1 tablet (600 mg total) every 6 (six) hours as needed by mouth. Patient not taking: Reported on 01/18/2018 08/05/17   Horton, Mayer Maskerourtney F, MD  lidocaine (XYLOCAINE) 2 % jelly Apply 1 application topically as needed. Patient not taking: Reported on 07/21/2018 06/23/18   Elisha PonderMurray, Alyssa B, PA-C  nitrofurantoin, macrocrystal-monohydrate, (MACROBID) 100 MG capsule Take 1 capsule (100 mg total) by mouth 2 (two) times daily. Patient not taking: Reported on 01/18/2018 04/28/17   Phillis HaggisMabe, Martha L, MD  ondansetron (ZOFRAN ODT) 4 MG disintegrating tablet Take 1 tablet (4  mg total) by mouth every 8 (eight) hours as needed for nausea or vomiting. Patient not taking: Reported on 01/18/2018 04/28/17   Phillis HaggisMabe, Martha L, MD  oxyCODONE-acetaminophen (PERCOCET/ROXICET) 5-325 MG tablet Take 1-2 tablets by mouth every 6 (six) hours as needed for severe pain. Patient not taking: Reported on 01/18/2018 04/28/17   Mabe, Latanya MaudlinMartha L, MD    Family History Family History  Problem Relation Age of Onset  . Bipolar disorder Mother   . Cancer Father   . ADD / ADHD Brother   . Cancer Maternal Grandmother   . Cancer Maternal Grandfather   . Cancer Paternal Grandmother   . Cancer Paternal Grandfather     Social History Social History   Tobacco Use  . Smoking status: Current Every Day Smoker    Packs/day: 0.50    Years: 5.00    Pack years: 2.50    Types: Cigarettes  . Smokeless tobacco: Never Used  Substance Use Topics  . Alcohol use: No  . Drug use: Yes    Types: Marijuana    Comment: quit in June 2015 per patient      Allergies   Azithromycin; Doxycycline; Prednisone; Bactrim [sulfamethoxazole-trimethoprim]; Flagyl [metronidazole]; Naproxen; Toradol [ketorolac tromethamine]; and Morphine and related   Review of Systems Review of Systems  Genitourinary: Positive for flank pain.  ROS: Statement: All systems negative except as marked or noted in the HPI; Constitutional: Negative for fever and chills. ; ; Eyes: Negative for eye pain, redness and discharge. ; ; ENMT: Negative for ear pain, hoarseness, nasal congestion, sinus pressure and sore throat. ; ; Cardiovascular: Negative for chest pain, palpitations, diaphoresis, dyspnea and peripheral edema. ; ; Respiratory: Negative for cough, wheezing and stridor. ; ; Gastrointestinal: +N/V. Negative for diarrhea, abdominal pain, blood in stool, hematemesis, jaundice and rectal bleeding. . ; ; Genitourinary: +dysuria. Negative for hematuria. ; ; GYN:  No pelvic pain, no vaginal bleeding, no vaginal discharge, no vulvar pain. ;;  Musculoskeletal: +back pain. Negative for neck pain. Negative for swelling and trauma.; ; Skin: Negative for pruritus, rash, abrasions, blisters, bruising and skin lesion.; ; Neuro: Negative for headache, lightheadedness and neck stiffness. Negative for weakness, altered level of consciousness, altered mental status, extremity weakness, paresthesias, involuntary movement, seizure and syncope.      Physical Exam Updated Vital Signs BP 110/71 (BP Location: Right Arm)   Pulse 89   Temp 98.3 F (36.8 C) (Oral) Comment: Pt states she took 600 mg ibprofen at 18:00  Resp 16  Ht 5\' 4"  (1.626 m)   Wt 63.5 kg   SpO2 99%   BMI 24.03 kg/m   Physical Exam 2115: Physical examination:  Nursing notes reviewed; Vital signs and O2 SAT reviewed;  Constitutional: Well developed, Well nourished, Well hydrated, In no acute distress; Head:  Normocephalic, atraumatic; Eyes: EOMI, PERRL, No scleral icterus; ENMT: Mouth and pharynx normal, Mucous membranes moist; Neck: Supple, Full range of motion, No lymphadenopathy; Cardiovascular: Regular rate and rhythm, No gallop; Respiratory: Breath sounds clear & equal bilaterally, No wheezes.  Speaking full sentences with ease, Normal respiratory effort/excursion; Chest: Nontender, Movement normal; Abdomen: Soft, Nontender, Nondistended, Normal bowel sounds; Genitourinary: No CVA tenderness; Spine:  No midline CS, TS, LS tenderness. +TTP bilat lumbar paraspinal muscles.;; Extremities: Peripheral pulses normal, No tenderness, No edema, No calf edema or asymmetry.; Neuro: AA&Ox3, Major CN grossly intact.  Speech clear. No gross focal motor or sensory deficits in extremities. Climbs on and off stretcher easily by herself. Gait steady..; Skin: Color normal, Warm, Dry.   ED Treatments / Results  Labs (all labs ordered are listed, but only abnormal results are displayed)   EKG None  Radiology   Procedures Procedures (including critical care time)  Medications Ordered in  ED Medications  acetaminophen (TYLENOL) tablet 650 mg (650 mg Oral Refused 09/27/18 2146)  promethazine (PHENERGAN) injection 25 mg (25 mg Intramuscular Refused 09/27/18 2146)     Initial Impression / Assessment and Plan / ED Course  I have reviewed the triage vital signs and the nursing notes.  Pertinent labs & imaging results that were available during my care of the patient were reviewed by me and considered in my medical decision making (see chart for details).  MDM Reviewed: previous chart, nursing note and vitals Reviewed previous: labs and CT scan Interpretation: labs and CT scan     Results for orders placed or performed during the hospital encounter of 09/27/18  Urinalysis, Routine w reflex microscopic- may I&O cath if menses  Result Value Ref Range   Color, Urine YELLOW YELLOW   APPearance HAZY (A) CLEAR   Specific Gravity, Urine 1.013 1.005 - 1.030   pH 6.0 5.0 - 8.0   Glucose, UA NEGATIVE NEGATIVE mg/dL   Hgb urine dipstick MODERATE (A) NEGATIVE   Bilirubin Urine NEGATIVE NEGATIVE   Ketones, ur NEGATIVE NEGATIVE mg/dL   Protein, ur NEGATIVE NEGATIVE mg/dL   Nitrite POSITIVE (A) NEGATIVE   Leukocytes, UA MODERATE (A) NEGATIVE   RBC / HPF 21-50 0 - 5 RBC/hpf   WBC, UA >50 (H) 0 - 5 WBC/hpf   Bacteria, UA MANY (A) NONE SEEN   Squamous Epithelial / LPF 0-5 0 - 5   Mucus PRESENT   Pregnancy, urine  Result Value Ref Range   Preg Test, Ur NEGATIVE NEGATIVE    Ct Renal Stone Study Result Date: 09/27/2018 CLINICAL DATA:  28 y/o  F; flank pain. EXAM: CT ABDOMEN AND PELVIS WITHOUT CONTRAST TECHNIQUE: Multidetector CT imaging of the abdomen and pelvis was performed following the standard protocol without IV contrast. COMPARISON:  07/28/2018 CT abdomen and pelvis FINDINGS: Lower chest: No acute abnormality. Hepatobiliary: No focal liver abnormality is seen. Status post cholecystectomy. No biliary dilatation. Pancreas: Unremarkable. No pancreatic ductal dilatation or  surrounding inflammatory changes. Spleen: Normal in size without focal abnormality. Adrenals/Urinary Tract: Adrenal glands are unremarkable. Kidneys are normal, without renal calculi, focal lesion, or hydronephrosis. Bladder is unremarkable. Stomach/Bowel: Stomach is within normal limits. Appendix not identified, no pericecal inflammation. No evidence of  bowel wall thickening, distention, or inflammatory changes. Vascular/Lymphatic: No significant vascular findings are present. No enlarged abdominal or pelvic lymph nodes. Reproductive: Uterus and bilateral adnexa are unremarkable. Other: No abdominal wall hernia or abnormality. No abdominopelvic ascites. Musculoskeletal: No acute or significant osseous findings. IMPRESSION: No urinary stone disease, obstructive uropathy, or acute process identified. Stable unremarkable CT of abdomen and pelvis. Electronically Signed   By: Mitzi Hansen M.D.   On: 09/27/2018 23:10     2225:  Pt requesting narcotic pain medications and an IV. IM and PO non-narcotic medications ordered. Pt became agitated and cursed at ED staff, refused CT scan because she was not receiving what she wanted. Explained to pt that she would be receiving pain/nausea meds, but not be receiving narcotic medications in the ED or by prescription, and would need to leave AMA if she refused workup ordered. Pt then agreed to have CT scan performed.   2210:  Pt has tol PO well while in the ED without N/V.  No stooling while in the ED.  Abd remains benign, resps easy, VSS. CT reassuring. Will tx for UTI on Udip; UC pending.  Dx and testing d/w pt.  Questions answered.  Verb understanding, agreeable to d/c home with outpt f/u.  2240:  Pt refuses keflex, stating "the only thing that works is macrobid." Unable to explain MDM/clinical  reasoning for abx choice with pt due to her aggression. Pt still very agitated and cursing ED staff regarding not receiving narcotics, esp by IV. Highly concerned for  drug seeking behavior (see previous EDP note below).     "Addendum: After patient received IV Dilaudid and Zofran she began to request ketamine.  This is concerning behavior and a former pain management patient who is no longer receiving opioid analgesics as an outpatient.  Patient probably should not receive any further analgesia in the ER for this complaint.  Final diagnoses:  Flank pain   Gilda Crease, MD 08/06/18 0246"        Final Clinical Impressions(s) / ED Diagnoses     Final Clinical Impressions(s) / ED Diagnoses   Final diagnoses:  None    ED Discharge Orders    None       Samuel Jester, DO 09/29/18 1650

## 2018-09-27 NOTE — ED Notes (Signed)
RN read pt discharge instructions. Pt stated "I'm not stupid. Just leave them here." Pt did verbalize understanding use of prescriptions, and educated on finding a PCP. Pt refuses to sign discharge paper work, as well as discharge vital signs.

## 2018-09-27 NOTE — ED Triage Notes (Signed)
Pt reports fever x8 days that she has been treating with ibuprofen. She is also complaining of bilateral flank pain and pain with urination. Hx of kidney stones and pylo. She also states that she has been vomiting for 4 days. She declines blood draw until she gets to the back.

## 2018-09-27 NOTE — Discharge Instructions (Signed)
Take the prescriptions as directed.  Increase your fluid intake (ie:  Gatoraide) for the next few days, as discussed.  Eat a bland diet and advance to your regular diet slowly as you can tolerate it.  Call your regular medical doctor and your Urologist on Monday to schedule a follow up appointment this week.  Return to the Emergency Department immediately sooner if worsening.

## 2018-09-27 NOTE — ED Notes (Signed)
Pt refused discharge vital signs. RN made aware.  

## 2018-09-29 ENCOUNTER — Emergency Department (HOSPITAL_COMMUNITY)
Admission: EM | Admit: 2018-09-29 | Discharge: 2018-09-29 | Disposition: A | Discharge status: Home / Self Care | Payer: BLUE CROSS/BLUE SHIELD | Attending: Emergency Medicine | Admitting: Emergency Medicine

## 2018-09-29 ENCOUNTER — Other Ambulatory Visit: Payer: Self-pay

## 2018-09-29 ENCOUNTER — Encounter (HOSPITAL_COMMUNITY): Payer: Self-pay | Admitting: Emergency Medicine

## 2018-09-29 DIAGNOSIS — F17210 Nicotine dependence, cigarettes, uncomplicated: Secondary | ICD-10-CM | POA: Insufficient documentation

## 2018-09-29 DIAGNOSIS — K047 Periapical abscess without sinus: Secondary | ICD-10-CM | POA: Insufficient documentation

## 2018-09-29 DIAGNOSIS — K029 Dental caries, unspecified: Secondary | ICD-10-CM

## 2018-09-29 DIAGNOSIS — Z79899 Other long term (current) drug therapy: Secondary | ICD-10-CM | POA: Insufficient documentation

## 2018-09-29 MED ORDER — PENICILLIN V POTASSIUM 500 MG PO TABS: 1000.0000 mg | ORAL_TABLET | Freq: Two times a day (BID) | ORAL | 0 refills | Status: DC

## 2018-09-29 MED ORDER — HYDROCODONE-ACETAMINOPHEN 5-325 MG PO TABS
1.0000 | ORAL_TABLET | Freq: Once | ORAL | Status: AC
  Administered 2018-09-29: 1 via ORAL
  Filled 2018-09-29: qty 1

## 2018-09-29 NOTE — Discharge Instructions (Signed)
Apply warm or cool compresses to jaw throughout the day. Take antibiotic until finished. Alternate between tylenol and motrin as needed for pain. Perform salt water swishes to help with pain/swelling. Use over the counter oragel as needed for additional relief. Followup with a dentist is very important for ongoing evaluation and management of recurrent dental pain, call the dentist listed above in the next 24-48 hours to schedule ongoing dental care, or use the list below to find a dentist in the next 24-48 hours for ongoing management of your dental issue. Return to emergency department for emergent changing or worsening symptoms.

## 2018-09-29 NOTE — ED Triage Notes (Signed)
Reports right lower and upper dental pain x 8 days.  Taking OTC meds without relief.

## 2018-09-29 NOTE — ED Provider Notes (Signed)
[facility]-EMERGENCY DEPT Provider Note   CSN: [id number] Arrival date & time: 09/29/18  1111     History   Chief Complaint Chief Complaint  Patient presents with  . Dental Pain    HPI Tonya Davidson is a 28 y.o. female with a PMHx of anemia, chronic pain, fibromyalgia, IBS, kidney stones, and other conditions listed below, who presents to the ED with complaints of right upper and lower dental pain that began 8 days ago.  She describes the pain as 9/10 constant throbbing right upper and lower dental pain that radiates to her right ear, worse with chewing and hot or cold exposure, and unrelieved with Orajel, ice, heat, and Advil.  She reports a fever of 102.3 as well as associated right facial/gum swelling.  She denies any gum drainage, drooling, trismus, ear drainage, or any other complaints at this time.  She does not currently have a dentist because hers retired recently.  The history is provided by the patient and medical records. No language interpreter was used.  Dental Pain  Associated symptoms: fever (102.3)   Associated symptoms: no drooling     Past Medical History:  Diagnosis Date  . Abnormal Pap smear    colposcopy  . Anemia   . Anxiety   . Arthritis   . Bipolar 1 disorder (HCC)   . C. difficile diarrhea   . Chlamydia 04/29/2012  . Chronic pain   . Depression   . Fibromyalgia   . HPV in female   . IBS (irritable bowel syndrome)   . Interstitial cystitis   . Kidney stones   . Mononucleosis   . Pelvic pain in female 12/14/2014  . PID (acute pelvic inflammatory disease) 04/28/2012  . Ulcer     Patient Active Problem List   Diagnosis Date Noted  . Pyelonephritis 09/15/2016  . Right flank pain   . Pelvic pain in female 12/14/2014  . Major depressive disorder, recurrent episode, severe, without mention of psychotic behavior 10/03/2013  . GAD (generalized anxiety disorder) 09/30/2013  . PID (acute pelvic inflammatory disease) 04/28/2012    . ANXIETY 05/18/2009  . IBS 05/18/2009    Past Surgical History:  Procedure Laterality Date  . COLPOSCOPY    . INNER EAR SURGERY    . KIDNEY STONE SURGERY    . LAPAROSCOPIC CHOLECYSTECTOMY    . TONSILLECTOMY    . WISDOM TOOTH EXTRACTION       OB History    Gravida  0   Para      Term      Preterm      AB      Living        SAB      TAB      Ectopic      Multiple      Live Births               Home Medications    Prior to Admission medications   Medication Sig Start Date End Date Taking? Authorizing Provider  acetaminophen (TYLENOL) 500 MG tablet Take 1,500 mg by mouth every 6 (six) hours as needed for moderate pain.    [provider]  ARIPiprazole (ABILIFY) 2 MG tablet Take 1 mg by mouth daily.    [provider]  clonazePAM (KLONOPIN) 0.5 MG tablet Take 0.5 mg by mouth 2 (two) times daily. 12/31/17   [provider]  cyclobenzaprine (FLEXERIL) 5 MG tablet Take 1 tablet (5 mg total) 2 (  two) times daily as needed by mouth for muscle spasms. Patient not taking: Reported on 01/18/2018 08/05/17   Horton, Mayer Maskerourtney F, MD  drospirenone-ethinyl estradiol (YAZ,GIANVI,LORYNA) 3-0.02 MG tablet Take 1 tablet by mouth daily.    [provider]  ibuprofen (ADVIL,MOTRIN) 200 MG tablet Take 400 mg by mouth every 6 (six) hours as needed for moderate pain.     [provider]  ibuprofen (ADVIL,MOTRIN) 600 MG tablet Take 1 tablet (600 mg total) every 6 (six) hours as needed by mouth. Patient not taking: Reported on 01/18/2018 08/05/17   Horton, Mayer Maskerourtney F, MD  lidocaine (XYLOCAINE) 2 % jelly Apply 1 application topically as needed. Patient not taking: Reported on 07/21/2018 06/23/18   Aviva KluverMurray, Alyssa B, PA-C  mirtazapine (REMERON) 15 MG tablet Take 15 mg by mouth at bedtime.  07/23/18   [provider]  nitrofurantoin, macrocrystal-monohydrate, (MACROBID) 100 MG capsule Take 1 capsule (100 mg total) by mouth 2 (two) times daily.  09/27/18   Samuel JesterMcManus, Kathleen, DO  ondansetron (ZOFRAN ODT) 4 MG disintegrating tablet Take 1 tablet (4 mg total) by mouth every 8 (eight) hours as needed for nausea or vomiting. Patient not taking: Reported on 01/18/2018 04/28/17   Phillis HaggisMabe, Martha L, MD  oxyCODONE-acetaminophen (PERCOCET/ROXICET) 5-325 MG tablet Take 1-2 tablets by mouth every 6 (six) hours as needed for severe pain. Patient not taking: Reported on 01/18/2018 04/28/17   Phillis HaggisMabe, Martha L, MD  promethazine (PHENERGAN) 25 MG tablet Take 1 tablet (25 mg total) by mouth every 6 (six) hours as needed for nausea or vomiting. 09/27/18   Samuel JesterMcManus, Kathleen, DO  sertraline (ZOLOFT) 25 MG tablet Take 12.5 mg by mouth daily.  01/06/18   [provider]    Family History Family History  Problem Relation Age of Onset  . Bipolar disorder Mother   . Cancer Father   . ADD / ADHD Brother   . Cancer Maternal Grandmother   . Cancer Maternal Grandfather   . Cancer Paternal Grandmother   . Cancer Paternal Grandfather     Social History Social History   Tobacco Use  . Smoking status: Current Every Day Smoker    Packs/day: 0.50    Years: 5.00    Pack years: 2.50    Types: Cigarettes  . Smokeless tobacco: Never Used  Substance Use Topics  . Alcohol use: No  . Drug use: Yes    Types: Marijuana    Comment: quit in June 2015 per patient      Allergies   Azithromycin; Doxycycline; Prednisone; Bactrim [sulfamethoxazole-trimethoprim]; Flagyl [metronidazole]; Naproxen; Toradol [ketorolac tromethamine]; and Morphine and related   Review of Systems Review of Systems  Constitutional: Positive for fever (102.3).  HENT: Positive for dental problem and ear pain. Negative for drooling, ear discharge and trouble swallowing.   Allergic/Immunologic: Negative for immunocompromised state.     Physical Exam Updated Vital Signs BP (!) 130/109 (BP Location: Right Arm)   Pulse (!) 101   Temp 98.2 F (36.8 C) (Oral)   Resp 16   Ht 5\' 4"  (1.626 m)    Wt 63.5 kg   LMP 09/29/2018 (Exact Date)   SpO2 98%   BMI 24.03 kg/m   Physical Exam Vitals signs and nursing note reviewed.  Constitutional:      General: She is not in acute distress.    Appearance: Normal appearance. She is well-developed. She is not toxic-appearing.     Comments: Afebrile, nontoxic, NAD  HENT:     Head: Normocephalic and  atraumatic.     Right Ear: Hearing, tympanic membrane, ear canal and external ear normal.     Left Ear: Hearing, tympanic membrane, ear canal and external ear normal.     Mouth/Throat:     Dentition: Dental tenderness and dental caries present. No gingival swelling or dental abscesses.      Comments: R upper and lower teeth/molars decayed with caries and lower tooth cracked in half with moderate TTP, with no surrounding gingival swelling or erythema, no definite abscess, no evidence of ludwig's.  Oropharynx clear and moist, without uvular swelling or deviation, no trismus or drooling, no tonsillar swelling or erythema, no exudates.   Ears clear.   Eyes:     General:        Right eye: No discharge.        Left eye: No discharge.     Conjunctiva/sclera: Conjunctivae normal.  Neck:     Musculoskeletal: Normal range of motion and neck supple.  Cardiovascular:     Rate and Rhythm: Normal rate.     Pulses: Normal pulses.     Comments: No tachycardia on exam Pulmonary:     Effort: Pulmonary effort is normal. No respiratory distress.  Abdominal:     General: There is no distension.  Musculoskeletal: Normal range of motion.  Skin:    General: Skin is warm and dry.     Findings: No rash.  Neurological:     Mental Status: She is alert and oriented to person, place, and time.     Sensory: Sensation is intact. No sensory deficit.     Motor: Motor function is intact.  Psychiatric:        Mood and Affect: Mood and affect normal.        Behavior: Behavior normal.      ED Treatments / Results  Labs (all labs ordered are listed, but only  abnormal results are displayed) Labs Reviewed - No data to display  EKG None  Radiology Ct Renal Stone Study  Result Date: 09/27/2018 CLINICAL DATA:  28 y/o  F; flank pain. EXAM: CT ABDOMEN AND PELVIS WITHOUT CONTRAST TECHNIQUE: Multidetector CT imaging of the abdomen and pelvis was performed following the standard protocol without IV contrast. COMPARISON:  07/28/2018 CT abdomen and pelvis FINDINGS: Lower chest: No acute abnormality. Hepatobiliary: No focal liver abnormality is seen. Status post cholecystectomy. No biliary dilatation. Pancreas: Unremarkable. No pancreatic ductal dilatation or surrounding inflammatory changes. Spleen: Normal in size without focal abnormality. Adrenals/Urinary Tract: Adrenal glands are unremarkable. Kidneys are normal, without renal calculi, focal lesion, or hydronephrosis. Bladder is unremarkable. Stomach/Bowel: Stomach is within normal limits. Appendix not identified, no pericecal inflammation. No evidence of bowel wall thickening, distention, or inflammatory changes. Vascular/Lymphatic: No significant vascular findings are present. No enlarged abdominal or pelvic lymph nodes. Reproductive: Uterus and bilateral adnexa are unremarkable. Other: No abdominal wall hernia or abnormality. No abdominopelvic ascites. Musculoskeletal: No acute or significant osseous findings. IMPRESSION: No urinary stone disease, obstructive uropathy, or acute process identified. Stable unremarkable CT of abdomen and pelvis. Electronically Signed   By: Mitzi Hansen M.D.   On: 09/27/2018 23:10    Procedures Procedures (including critical care time)  Medications Ordered in ED Medications  HYDROcodone-acetaminophen (NORCO/VICODIN) 5-325 MG per tablet 1 tablet (1 tablet Oral Given 09/29/18 1231)     Initial Impression / Assessment and Plan / ED Course  I have reviewed the triage vital signs and the nursing notes.  Pertinent labs & imaging results that were available  during my  care of the patient were reviewed by me and considered in my medical decision making (see chart for details).     29 y.o. female here with dental pain associated with dental caries/broken tooth, and possible dental infection but no dental abscess, with patient afebrile, non toxic appearing and swallowing secretions well, no evidence of ludwig's. I gave patient referral to dentist/resource guide of dentists and stressed the importance of dental follow up for ultimate management of dental pain.  I have also discussed reasons to return immediately to the ER.  Patient expresses understanding and agrees with plan.  I will also give PCN VK and discussed OTC remedies for pain control. Of note, she's already on macrobid for a UTI, will have her continue this since she already started, and since there isn't a good abx choice for oral and UTI infections.    Final Clinical Impressions(s) / ED Diagnoses   Final diagnoses:  Dental caries  Pain due to dental caries  Dental infection    ED Discharge Orders         Ordered    penicillin v potassium (VEETID) 500 MG tablet  2 times daily     09/29/18 [address] Kahli Fitzgerald, [place], [place] 09/29/18 1237    Pricilla Loveless, MD 09/29/18 [phone]

## 2018-09-30 LAB — URINE CULTURE: Culture: >=100000 — AB

## 2018-10-01 ENCOUNTER — Telehealth: Payer: Self-pay | Admitting: Emergency Medicine

## 2018-10-01 NOTE — Telephone Encounter (Signed)
Post ED Visit - Positive Culture Follow-up  Culture report reviewed by antimicrobial stewardship pharmacist:  []  Enzo Bi, Pharm.D. []  Celedonio Miyamoto, Pharm.D., BCPS AQ-ID []  Garvin Fila, Pharm.D., BCPS []  Georgina Pillion, [address].D., BCPS []  [place], [address].D., BCPS, AAHIVP []  Estella Husk, Pharm.D., BCPS, AAHIVP []  Lysle Pearl, PharmD, BCPS []  Phillips Climes, PharmD, BCPS []  Agapito Games, PharmD, BCPS []  Verlan Friends, PharmD Ewing Schlein PharmD  Positive urine culture Treated with cephalexin and nitrofurantoin,organism sensitive to the same and no further patient follow-up is required at this time.  Berle Mull 10/01/2018, 10:24 AM

## 2018-10-12 ENCOUNTER — Ambulatory Visit (HOSPITAL_COMMUNITY)
Admission: RE | Admit: 2018-10-12 | Discharge: 2018-10-12 | Disposition: A | Discharge status: Home / Self Care | Payer: Self-pay | Source: Home / Self Care | Attending: Psychiatry | Admitting: Psychiatry

## 2018-10-12 ENCOUNTER — Other Ambulatory Visit: Payer: Self-pay

## 2018-10-12 ENCOUNTER — Encounter (HOSPITAL_COMMUNITY): Payer: Self-pay | Admitting: Emergency Medicine

## 2018-10-12 ENCOUNTER — Emergency Department (HOSPITAL_COMMUNITY)
Admission: EM | Admit: 2018-10-12 | Discharge: 2018-10-13 | Disposition: A | Discharge status: Other Acute Inpatient Hospital | Payer: BLUE CROSS/BLUE SHIELD | Source: Home / Self Care | Attending: Emergency Medicine | Admitting: Emergency Medicine

## 2018-10-12 DIAGNOSIS — F17210 Nicotine dependence, cigarettes, uncomplicated: Secondary | ICD-10-CM | POA: Insufficient documentation

## 2018-10-12 DIAGNOSIS — F1910 Other psychoactive substance abuse, uncomplicated: Secondary | ICD-10-CM | POA: Diagnosis present

## 2018-10-12 DIAGNOSIS — F3113 Bipolar disorder, current episode manic without psychotic features, severe: Secondary | ICD-10-CM | POA: Insufficient documentation

## 2018-10-12 DIAGNOSIS — Z79899 Other long term (current) drug therapy: Secondary | ICD-10-CM

## 2018-10-12 DIAGNOSIS — F332 Major depressive disorder, recurrent severe without psychotic features: Secondary | ICD-10-CM | POA: Diagnosis present

## 2018-10-12 DIAGNOSIS — F1120 Opioid dependence, uncomplicated: Secondary | ICD-10-CM

## 2018-10-12 DIAGNOSIS — R102 Pelvic and perineal pain: Secondary | ICD-10-CM | POA: Insufficient documentation

## 2018-10-12 DIAGNOSIS — T50902A Poisoning by unspecified drugs, medicaments and biological substances, intentional self-harm, initial encounter: Secondary | ICD-10-CM

## 2018-10-12 LAB — CBC
HEMATOCRIT: 36 % (ref 36.0–46.0)
Hemoglobin: 11.8 g/dL — ABNORMAL LOW (ref 12.0–15.0)
MCH: 31.4 pg (ref 26.0–34.0)
MCHC: 32.8 g/dL (ref 30.0–36.0)
MCV: 95.7 fL (ref 80.0–100.0)
Platelets: 290 10*3/uL (ref 150–400)
RBC: 3.76 MIL/uL — ABNORMAL LOW (ref 3.87–5.11)
RDW: 11.7 % (ref 11.5–15.5)
WBC: 9.4 10*3/uL (ref 4.0–10.5)
nRBC: 0 % (ref 0.0–0.2)

## 2018-10-12 LAB — COMPREHENSIVE METABOLIC PANEL
ALK PHOS: 61 U/L (ref 38–126)
ALT: 14 U/L (ref 0–44)
AST: 19 U/L (ref 15–41)
Albumin: 4.2 g/dL (ref 3.5–5.0)
Anion gap: 7 (ref 5–15)
BUN: 10 mg/dL (ref 6–20)
CALCIUM: 8.9 mg/dL (ref 8.9–10.3)
CO2: 21 mmol/L — ABNORMAL LOW (ref 22–32)
Chloride: 110 mmol/L (ref 98–111)
Creatinine, Ser: 0.83 mg/dL (ref 0.44–1.00)
GFR calc Af Amer: >60 mL/min (ref >60)
GFR calc non Af Amer: >60 mL/min (ref >60)
Glucose, Bld: 90 mg/dL (ref 70–99)
Potassium: 3.9 mmol/L (ref 3.5–5.1)
Sodium: 138 mmol/L (ref 135–145)
Total Bilirubin: 0.9 mg/dL (ref 0.3–1.2)
Total Protein: 7.6 g/dL (ref 6.5–8.1)

## 2018-10-12 LAB — SALICYLATE LEVEL: Salicylate Lvl: <7 mg/dL (ref 2.8–30.0)

## 2018-10-12 LAB — ETHANOL: Alcohol, Ethyl (B): <10 mg/dL (ref <10)

## 2018-10-12 LAB — ACETAMINOPHEN LEVEL: Acetaminophen (Tylenol), Serum: 11 ug/mL (ref 10–30)

## 2018-10-12 LAB — CBG MONITORING, ED: Glucose-Capillary: 90 mg/dL (ref 70–99)

## 2018-10-12 LAB — I-STAT BETA HCG BLOOD, ED (MC, WL, AP ONLY): I-stat hCG, quantitative: <5 m[IU]/mL (ref <5)

## 2018-10-12 MED ORDER — SODIUM CHLORIDE 0.9 % IV BOLUS
1000.0000 mL | Freq: Once | INTRAVENOUS | Status: AC
  Administered 2018-10-12: 1000 mL via INTRAVENOUS

## 2018-10-12 MED ORDER — SODIUM CHLORIDE 0.9 % IV BOLUS: 1000.0000 mL | Freq: Once | INTRAVENOUS | Status: DC

## 2018-10-12 MED ORDER — ACETAMINOPHEN 325 MG PO TABS
650.0000 mg | ORAL_TABLET | Freq: Once | ORAL | Status: DC
  Filled 2018-10-12: qty 2

## 2018-10-12 MED ORDER — ONDANSETRON HCL 4 MG/2ML IJ SOLN
4.0000 mg | Freq: Once | INTRAMUSCULAR | Status: AC
  Administered 2018-10-13: 4 mg via INTRAVENOUS
  Filled 2018-10-12 (×2): qty 2

## 2018-10-12 NOTE — ED Notes (Addendum)
Pt. In burgundy scrubs. Pt. Has 2 belongings bags. Pt. Has 1pr. Brown boots, 1 pink coat, 1 black hat, 1 pr.black/white socks, 1 gray pant, 1 gray cell phone and no wallet(no money and no jewelry), 1 black/white pant. Pt. Searched and wanded by security and taken to secure area. Pt. Belongings locked up in cabinet 16-19 between RES A and B.

## 2018-10-12 NOTE — ED Notes (Signed)
Pt. Attempted urine specimen but unsuccessful. 

## 2018-10-12 NOTE — ED Notes (Addendum)
Client presented as a walk-in at [facility] when she disclosed to clinician that she was suicidal and had attempted overdose 5 hours ago by taking 4x .5mg  Clonazepams and "handful of Lucemyra". Patient then clarified 8x Lucemyra. Patient reported she was also in withdrawals for oxycodone, last usage 2 days ago. Clinician informed Caryl Ada, whom called EMS, patient transported to [facility] Emergency Room.

## 2018-10-12 NOTE — ED Notes (Signed)
Urine sample not taken.

## 2018-10-12 NOTE — ED Notes (Signed)
Bed: RESB Expected date:  Expected time:  Means of arrival:  Comments: EMS from [facility]-took 4 Klonopin BP 80/50 IVF-SI

## 2018-10-12 NOTE — ED Notes (Signed)
EKG given to EDP,Little,MD., for review. 

## 2018-10-12 NOTE — ED Provider Notes (Signed)
[facility]-EMERGENCY DEPT Provider Note   CSN: [id number] Arrival date & time: 10/12/18  2022     History   Chief Complaint No chief complaint on file.   HPI Tonya Davidson is a 28 y.o. female.  The history is provided by the patient, medical records and the EMS personnel. No language interpreter was used.  Drug Overdose      28 year old female with history of anxiety, depression bipolar, fibromyalgia presents with attempted overdose.  Per triage note, patient went to behavioral health clinic today voicing that she was trying to kill herself.  States that she took several Lurcema and Klonopin throughout the day today in an attempt to help with her persistent back pain and dysuria.  States she takes approximately 8 Lurcema throughout the day as well as 4 0.5mg  klonopin.  She mentions just trying to mask her pain without adequate relief.  When asked if she is suicidal patient denies.  She denies any homicidal ideation, auditory or visual hallucination.  She denies alcohol or tobacco abuse or drug abuse.  She mention having history of interstitial cystitis in which she has been managed by urologist but symptoms still persist.  She reports frustration with her pain but states she is not suicidal.  She denies any recent alcohol use.  Past Medical History:  Diagnosis Date  . Abnormal Pap smear    colposcopy  . Anemia   . Anxiety   . Arthritis   . Bipolar 1 disorder (HCC)   . C. difficile diarrhea   . Chlamydia 04/29/2012  . Chronic pain   . Depression   . Fibromyalgia   . HPV in female   . IBS (irritable bowel syndrome)   . Interstitial cystitis   . Kidney stones   . Mononucleosis   . Pelvic pain in female 12/14/2014  . PID (acute pelvic inflammatory disease) 04/28/2012  . Ulcer     Patient Active Problem List   Diagnosis Date Noted  . Pyelonephritis 09/15/2016  . Right flank pain   . Pelvic pain in female 12/14/2014  . Major depressive disorder,  recurrent episode, severe, without mention of psychotic behavior 10/03/2013  . GAD (generalized anxiety disorder) 09/30/2013  . PID (acute pelvic inflammatory disease) 04/28/2012  . ANXIETY 05/18/2009  . IBS 05/18/2009    Past Surgical History:  Procedure Laterality Date  . COLPOSCOPY    . INNER EAR SURGERY    . KIDNEY STONE SURGERY    . LAPAROSCOPIC CHOLECYSTECTOMY    . TONSILLECTOMY    . WISDOM TOOTH EXTRACTION       OB History    Gravida  0   Para      Term      Preterm      AB      Living        SAB      TAB      Ectopic      Multiple      Live Births               Home Medications    Prior to Admission medications   Medication Sig Start Date End Date Taking? Authorizing Provider  acetaminophen (TYLENOL) 500 MG tablet Take 1,500 mg by mouth every 6 (six) hours as needed for moderate pain.    [provider]  ARIPiprazole (ABILIFY) 2 MG tablet Take 1 mg by mouth daily.    [provider]  clonazePAM (KLONOPIN) 0.5 MG tablet Take 0.5  mg by mouth 2 (two) times daily. 12/31/17   [provider]  cyclobenzaprine (FLEXERIL) 5 MG tablet Take 1 tablet (5 mg total) 2 (two) times daily as needed by mouth for muscle spasms. Patient not taking: Reported on 01/18/2018 08/05/17   Horton, Mayer Masker, MD  drospirenone-ethinyl estradiol (YAZ,GIANVI,LORYNA) 3-0.02 MG tablet Take 1 tablet by mouth daily.    [provider]  ibuprofen (ADVIL,MOTRIN) 200 MG tablet Take 400 mg by mouth every 6 (six) hours as needed for moderate pain.     [provider]  ibuprofen (ADVIL,MOTRIN) 600 MG tablet Take 1 tablet (600 mg total) every 6 (six) hours as needed by mouth. Patient not taking: Reported on 01/18/2018 08/05/17   Horton, Mayer Masker, MD  lidocaine (XYLOCAINE) 2 % jelly Apply 1 application topically as needed. Patient not taking: Reported on 07/21/2018 06/23/18   Aviva Kluver B, PA-C  mirtazapine (REMERON) 15 MG tablet Take 15 mg by  mouth at bedtime.  07/23/18   [provider]  nitrofurantoin, macrocrystal-monohydrate, (MACROBID) 100 MG capsule Take 1 capsule (100 mg total) by mouth 2 (two) times daily. 09/27/18   Samuel Jester, DO  ondansetron (ZOFRAN ODT) 4 MG disintegrating tablet Take 1 tablet (4 mg total) by mouth every 8 (eight) hours as needed for nausea or vomiting. Patient not taking: Reported on 01/18/2018 04/28/17   Phillis Haggis, MD  oxyCODONE-acetaminophen (PERCOCET/ROXICET) 5-325 MG tablet Take 1-2 tablets by mouth every 6 (six) hours as needed for severe pain. Patient not taking: Reported on 01/18/2018 04/28/17   Phillis Haggis, MD  penicillin v potassium (VEETID) 500 MG tablet Take 2 tablets (1,000 mg total) by mouth 2 (two) times daily. X 7 days 09/29/18   Street, [place], PA-C  promethazine (PHENERGAN) 25 MG tablet Take 1 tablet (25 mg total) by mouth every 6 (six) hours as needed for nausea or vomiting. 09/27/18   Samuel Jester, DO  sertraline (ZOLOFT) 25 MG tablet Take 12.5 mg by mouth daily.  01/06/18   [provider]    Family History Family History  Problem Relation Age of Onset  . Bipolar disorder Mother   . Cancer Father   . ADD / ADHD Brother   . Cancer Maternal Grandmother   . Cancer Maternal Grandfather   . Cancer Paternal Grandmother   . Cancer Paternal Grandfather     Social History Social History   Tobacco Use  . Smoking status: Current Every Day Smoker    Packs/day: 0.50    Years: 5.00    Pack years: 2.50    Types: Cigarettes  . Smokeless tobacco: Never Used  Substance Use Topics  . Alcohol use: No  . Drug use: Yes    Types: Marijuana    Comment: quit in June 2015 per patient      Allergies   Azithromycin; Doxycycline; Prednisone; Bactrim [sulfamethoxazole-trimethoprim]; Flagyl [metronidazole]; Naproxen; Toradol [ketorolac tromethamine]; and Morphine and related   Review of Systems Review of Systems  All other systems reviewed and are  negative.    Physical Exam Updated Vital Signs BP 91/64 (BP Location: Right Arm)   Pulse 67   Temp 98 F (36.7 C) (Oral)   Resp (!) 23   Ht 5\' 4"  (1.626 m)   Wt 63.5 kg   LMP 09/29/2018 (Exact Date)   SpO2 96%   BMI 24.03 kg/m   Physical Exam Vitals signs and nursing note reviewed.  Constitutional:      General: She is not in acute  distress.    Appearance: She is well-developed.     Comments: Patient appears mildly drowsy but easily arousable and answer question appropriately.  HENT:     Head: Normocephalic and atraumatic.     Mouth/Throat:     Mouth: Mucous membranes are dry.  Eyes:     Conjunctiva/sclera: Conjunctivae normal.  Neck:     Musculoskeletal: Neck supple. No neck rigidity.  Cardiovascular:     Rate and Rhythm: Normal rate and regular rhythm.     Pulses: Normal pulses.     Heart sounds: Normal heart sounds.  Pulmonary:     Breath sounds: Normal breath sounds. No wheezing or rhonchi.  Abdominal:     Palpations: Abdomen is soft.     Tenderness: There is abdominal tenderness (Mild suprapubic tenderness without guarding or rebound tenderness).  Genitourinary:    Comments: Chaperone present during exam.  No inguinal lymphadenopathy or inguinal hernia.  Normal external genitalia.  Discomfort with speculum insertion.  Moderate amount of vaginal discharge noted.  Cervical os closed without lesion or rash.  R adnexal tenderness with CMT.  Musculoskeletal:        General: Tenderness (Tenderness to lumbar paraspinal muscle bilaterally) present.  Skin:    Findings: No rash.  Neurological:     Mental Status: She is oriented to person, place, and time.     GCS: GCS eye subscore is 4. GCS verbal subscore is 5. GCS motor subscore is 6.     Cranial Nerves: Cranial nerves are intact.     Sensory: Sensation is intact.     Motor: Motor function is intact.  Psychiatric:        Mood and Affect: Affect is blunt and flat.        Speech: Speech is slurred.        Behavior:  Behavior is slowed.        Thought Content: Thought content is not paranoid. Thought content does not include homicidal or suicidal ideation.      ED Treatments / Results  Labs (all labs ordered are listed, but only abnormal results are displayed) Labs Reviewed  COMPREHENSIVE METABOLIC PANEL - Abnormal; Notable for the following components:      Result Value   CO2 21 (*)    All other components within normal limits  CBC - Abnormal; Notable for the following components:   RBC 3.76 (*)    Hemoglobin 11.8 (*)    All other components within normal limits  ETHANOL  SALICYLATE LEVEL  ACETAMINOPHEN LEVEL  RAPID URINE DRUG SCREEN, HOSP PERFORMED  CBG MONITORING, ED  I-STAT BETA HCG BLOOD, ED ([facility], [facility], [facility] ONLY)    EKG None  ED ECG REPORT   Date: 10/12/2018  Rate: 62  Rhythm: normal sinus rhythm  QRS Axis: normal  Intervals: normal  ST/T Wave abnormalities: nonspecific T wave changes  Conduction Disutrbances:none  Narrative Interpretation:   Old EKG Reviewed: unchanged  I have personally reviewed the EKG tracing and agree with the computerized printout as noted.   Radiology No results found.  Procedures Procedures (including critical care time)  Medications Ordered in ED Medications  acetaminophen (TYLENOL) tablet 650 mg (has no administration in time range)  ondansetron (ZOFRAN) injection 4 mg (has no administration in time range)  sodium chloride 0.9 % bolus 1,000 mL (has no administration in time range)  sodium chloride 0.9 % bolus 1,000 mL (0 mLs Intravenous Stopped 10/12/18 2300)     Initial Impression / Assessment and Plan /  ED Course  I have reviewed the triage vital signs and the nursing notes.  Pertinent labs & imaging results that were available during my care of the patient were reviewed by me and considered in my medical decision making (see chart for details).     BP 95/65 (BP Location: Right Arm)   Pulse (!) 57   Temp 98 F (36.7 C) (Oral)   Resp  17   Ht 5\' 4"  (1.626 m)   Wt 63.5 kg   LMP 09/29/2018 (Exact Date)   SpO2 99%   BMI 24.03 kg/m    Final Clinical Impressions(s) / ED Diagnoses   Final diagnoses:  Drug overdose, undetermined intent, initial encounter  Pelvic pain    ED Discharge Orders    None     9:12 PM Patient states she did not intentionally try to overdose today while taking her Lucerma and Klonopin more than usual.  States that she only tried to use her lower back and abdominal pain related to her interstitial cystitis and chronic back pain.  Nursing note mention she voiced SI at behavioral health.  [facility] who recommend patient to be observed for 6 hours to ensure no prolonged QT or hypotension.  Will monitor appropriately.  IV fluid given.  Patient was found to be mildly hypotensive with blood pressure 91/61.  11:23 PM Pt requesting opiate pain medication, which I felt may not be the best option.  I offer tylenol/ibuprofen, pt declined.  Will continue with IVF, monitor closely and reassess.   11:38 PM Pt signed out to Elpidio AnisShari Upstill, PA-C who will reassess pt around 6 hrs mark (3am), once pt is medically cleared, TTS can be consult for further psychiatric assessment.   12:32 AM Pt c/o vaginal discharge for the past few days and request for pelvic examination.  On exam she does have moderate vaginal discharge and CMT suspicious of PID.  Will give rocephin/zithromax.  Pt will benefit from doxycycline as treatment of PID.    Fayrene Helperran, Reubin Bushnell, PA-C 10/13/18 [id number]    Benjiman CorePickering, Nathan, MD 10/13/18 2256

## 2018-10-12 NOTE — ED Triage Notes (Signed)
Patient took several lurcema and clonopin because she was trying to kill herself. Patient took it around 3 hours ago. Patient states she is going through to much to live.

## 2018-10-12 NOTE — ED Notes (Signed)
Pt refusing Acetaminophen, Zofran, and Sodium Chloride at this time. Provider notified and will continue to monitor.

## 2018-10-12 NOTE — ED Notes (Signed)
Provider notified of blood pressure

## 2018-10-13 ENCOUNTER — Inpatient Hospital Stay (HOSPITAL_COMMUNITY)
Admission: AD | Admit: 2018-10-13 | Discharge: 2018-10-15 | DRG: 885 | Disposition: A | Discharge status: Home / Self Care | Payer: BLUE CROSS/BLUE SHIELD | Source: Intra-hospital | Attending: Psychiatry | Admitting: Psychiatry

## 2018-10-13 ENCOUNTER — Encounter (HOSPITAL_COMMUNITY): Payer: Self-pay

## 2018-10-13 ENCOUNTER — Emergency Department (HOSPITAL_COMMUNITY): Payer: BLUE CROSS/BLUE SHIELD

## 2018-10-13 DIAGNOSIS — F1910 Other psychoactive substance abuse, uncomplicated: Secondary | ICD-10-CM | POA: Diagnosis present

## 2018-10-13 DIAGNOSIS — Z599 Problem related to housing and economic circumstances, unspecified: Secondary | ICD-10-CM

## 2018-10-13 DIAGNOSIS — Z62810 Personal history of physical and sexual abuse in childhood: Secondary | ICD-10-CM | POA: Diagnosis present

## 2018-10-13 DIAGNOSIS — F5104 Psychophysiologic insomnia: Secondary | ICD-10-CM | POA: Diagnosis present

## 2018-10-13 DIAGNOSIS — Z87442 Personal history of urinary calculi: Secondary | ICD-10-CM

## 2018-10-13 DIAGNOSIS — Z885 Allergy status to narcotic agent status: Secondary | ICD-10-CM

## 2018-10-13 DIAGNOSIS — I959 Hypotension, unspecified: Secondary | ICD-10-CM | POA: Diagnosis present

## 2018-10-13 DIAGNOSIS — Z818 Family history of other mental and behavioral disorders: Secondary | ICD-10-CM

## 2018-10-13 DIAGNOSIS — F419 Anxiety disorder, unspecified: Secondary | ICD-10-CM | POA: Diagnosis present

## 2018-10-13 DIAGNOSIS — Z79899 Other long term (current) drug therapy: Secondary | ICD-10-CM

## 2018-10-13 DIAGNOSIS — K0889 Other specified disorders of teeth and supporting structures: Secondary | ICD-10-CM | POA: Diagnosis present

## 2018-10-13 DIAGNOSIS — T424X2A Poisoning by benzodiazepines, intentional self-harm, initial encounter: Secondary | ICD-10-CM | POA: Diagnosis present

## 2018-10-13 DIAGNOSIS — F332 Major depressive disorder, recurrent severe without psychotic features: Principal | ICD-10-CM | POA: Diagnosis present

## 2018-10-13 DIAGNOSIS — G8929 Other chronic pain: Secondary | ICD-10-CM | POA: Diagnosis present

## 2018-10-13 DIAGNOSIS — F411 Generalized anxiety disorder: Secondary | ICD-10-CM | POA: Diagnosis present

## 2018-10-13 DIAGNOSIS — F1120 Opioid dependence, uncomplicated: Secondary | ICD-10-CM | POA: Diagnosis present

## 2018-10-13 DIAGNOSIS — K589 Irritable bowel syndrome without diarrhea: Secondary | ICD-10-CM | POA: Diagnosis present

## 2018-10-13 DIAGNOSIS — M797 Fibromyalgia: Secondary | ICD-10-CM | POA: Diagnosis present

## 2018-10-13 LAB — WET PREP, GENITAL
Sperm: NONE SEEN
Trich, Wet Prep: NONE SEEN
Yeast Wet Prep HPF POC: NONE SEEN

## 2018-10-13 LAB — RAPID URINE DRUG SCREEN, HOSP PERFORMED
Amphetamines: NOT DETECTED
Barbiturates: NOT DETECTED
Benzodiazepines: POSITIVE — AB
Cocaine: NOT DETECTED
Opiates: NOT DETECTED
Tetrahydrocannabinol: NOT DETECTED

## 2018-10-13 LAB — URINALYSIS, ROUTINE W REFLEX MICROSCOPIC
Bilirubin Urine: NEGATIVE
Glucose, UA: NEGATIVE mg/dL
Ketones, ur: NEGATIVE mg/dL
Nitrite: NEGATIVE
Protein, ur: NEGATIVE mg/dL
Specific Gravity, Urine: 1.015 (ref 1.005–1.030)
pH: 5 (ref 5.0–8.0)

## 2018-10-13 LAB — HIV ANTIBODY (ROUTINE TESTING W REFLEX): HIV Screen 4th Generation wRfx: NONREACTIVE

## 2018-10-13 LAB — RPR: RPR Ser Ql: NONREACTIVE

## 2018-10-13 MED ORDER — LOPERAMIDE HCL 2 MG PO CAPS: 2.0000 mg | ORAL_CAPSULE | ORAL | Status: DC | PRN

## 2018-10-13 MED ORDER — ONDANSETRON 4 MG PO TBDP: 4.0000 mg | ORAL_TABLET | Freq: Four times a day (QID) | ORAL | Status: DC | PRN

## 2018-10-13 MED ORDER — MIRTAZAPINE 15 MG PO TABS
15.0000 mg | ORAL_TABLET | Freq: Every day | ORAL | Status: DC
  Administered 2018-10-13 – 2018-10-14 (×2): 15 mg via ORAL
  Filled 2018-10-13: qty 1
  Filled 2018-10-13: qty 7
  Filled 2018-10-13: qty 1
  Filled 2018-10-13: qty 7
  Filled 2018-10-13 (×2): qty 1

## 2018-10-13 MED ORDER — STERILE WATER FOR INJECTION IJ SOLN
INTRAMUSCULAR | Status: AC
  Filled 2018-10-13: qty 10

## 2018-10-13 MED ORDER — HYDROXYZINE HCL 25 MG PO TABS: 25.0000 mg | ORAL_TABLET | Freq: Four times a day (QID) | ORAL | Status: DC | PRN

## 2018-10-13 MED ORDER — LOPERAMIDE HCL 2 MG PO CAPS
2.0000 mg | ORAL_CAPSULE | Freq: Once | ORAL | Status: AC
  Administered 2018-10-13: 2 mg via ORAL
  Filled 2018-10-13: qty 1

## 2018-10-13 MED ORDER — ARIPIPRAZOLE 2 MG PO TABS
1.0000 mg | ORAL_TABLET | Freq: Every day | ORAL | Status: DC
  Administered 2018-10-14 – 2018-10-15 (×2): 1 mg via ORAL
  Filled 2018-10-13 (×3): qty 1
  Filled 2018-10-13: qty 4
  Filled 2018-10-13: qty 1
  Filled 2018-10-13: qty 4
  Filled 2018-10-13: qty 1

## 2018-10-13 MED ORDER — QUETIAPINE FUMARATE 50 MG PO TABS
50.0000 mg | ORAL_TABLET | Freq: Once | ORAL | Status: AC
  Filled 2018-10-13: qty 1

## 2018-10-13 MED ORDER — ARIPIPRAZOLE 2 MG PO TABS: 1.0000 mg | ORAL_TABLET | Freq: Every day | ORAL | Status: DC

## 2018-10-13 MED ORDER — ADULT MULTIVITAMIN W/MINERALS CH
1.0000 | ORAL_TABLET | Freq: Every day | ORAL | Status: DC
  Filled 2018-10-13 (×4): qty 1

## 2018-10-13 MED ORDER — VITAMIN B-1 100 MG PO TABS
100.0000 mg | ORAL_TABLET | Freq: Every day | ORAL | Status: DC
  Filled 2018-10-13 (×3): qty 1

## 2018-10-13 MED ORDER — SERTRALINE HCL 25 MG PO TABS
12.5000 mg | ORAL_TABLET | Freq: Every day | ORAL | Status: DC
  Administered 2018-10-14 – 2018-10-15 (×2): 12.5 mg via ORAL
  Filled 2018-10-13: qty 0.5
  Filled 2018-10-13: qty 4
  Filled 2018-10-13: qty 0.5
  Filled 2018-10-13: qty 4
  Filled 2018-10-13: qty 0.5
  Filled 2018-10-13: qty 1
  Filled 2018-10-13: qty 0.5

## 2018-10-13 MED ORDER — MAGNESIUM HYDROXIDE 400 MG/5ML PO SUSP: 30.0000 mL | Freq: Every day | ORAL | Status: DC | PRN

## 2018-10-13 MED ORDER — MIRTAZAPINE 15 MG PO TABS: 15.0000 mg | ORAL_TABLET | Freq: Every day | ORAL | Status: DC

## 2018-10-13 MED ORDER — METHOCARBAMOL 500 MG PO TABS
500.0000 mg | ORAL_TABLET | Freq: Three times a day (TID) | ORAL | Status: DC | PRN
  Administered 2018-10-13 – 2018-10-14 (×2): 500 mg via ORAL
  Filled 2018-10-13 (×2): qty 1

## 2018-10-13 MED ORDER — ALUM & MAG HYDROXIDE-SIMETH 200-200-20 MG/5ML PO SUSP
30.0000 mL | Freq: Once | ORAL | Status: DC
  Filled 2018-10-13: qty 30

## 2018-10-13 MED ORDER — ALUM & MAG HYDROXIDE-SIMETH 200-200-20 MG/5ML PO SUSP: 30.0000 mL | ORAL | Status: DC | PRN

## 2018-10-13 MED ORDER — LORAZEPAM 1 MG PO TABS
1.0000 mg | ORAL_TABLET | Freq: Four times a day (QID) | ORAL | Status: DC | PRN
  Administered 2018-10-13: 1 mg via ORAL
  Filled 2018-10-13: qty 1

## 2018-10-13 MED ORDER — HYDROXYZINE HCL 25 MG PO TABS
25.0000 mg | ORAL_TABLET | Freq: Four times a day (QID) | ORAL | Status: DC | PRN
  Administered 2018-10-13: 25 mg via ORAL
  Filled 2018-10-13 (×2): qty 1
  Filled 2018-10-13: qty 10

## 2018-10-13 MED ORDER — LOPERAMIDE HCL 2 MG PO CAPS
2.0000 mg | ORAL_CAPSULE | ORAL | Status: DC | PRN
  Administered 2018-10-14 – 2018-10-15 (×3): 4 mg via ORAL
  Filled 2018-10-13 (×3): qty 2

## 2018-10-13 MED ORDER — SERTRALINE HCL 25 MG PO TABS: 12.5000 mg | ORAL_TABLET | Freq: Every day | ORAL | Status: DC

## 2018-10-13 MED ORDER — THIAMINE HCL 100 MG/ML IJ SOLN: 100.0000 mg | Freq: Once | INTRAMUSCULAR | Status: DC

## 2018-10-13 MED ORDER — ACETAMINOPHEN 500 MG PO TABS
1000.0000 mg | ORAL_TABLET | Freq: Four times a day (QID) | ORAL | Status: DC | PRN
  Administered 2018-10-13: 1000 mg via ORAL
  Filled 2018-10-13: qty 2

## 2018-10-13 MED ORDER — DROSPIRENONE-ETHINYL ESTRADIOL 3-0.02 MG PO TABS: 1.0000 | ORAL_TABLET | Freq: Every day | ORAL | Status: DC

## 2018-10-13 MED ORDER — AZITHROMYCIN 250 MG PO TABS
1000.0000 mg | ORAL_TABLET | Freq: Once | ORAL | Status: DC
  Filled 2018-10-13: qty 4

## 2018-10-13 MED ORDER — SERTRALINE HCL 25 MG PO TABS
12.5000 mg | ORAL_TABLET | Freq: Every day | ORAL | Status: DC
  Administered 2018-10-13: 12.5 mg via ORAL
  Filled 2018-10-13: qty 0.5

## 2018-10-13 MED ORDER — MIRTAZAPINE 7.5 MG PO TABS
15.0000 mg | ORAL_TABLET | Freq: Every day | ORAL | Status: DC
  Administered 2018-10-13: 15 mg via ORAL
  Filled 2018-10-13 (×2): qty 2

## 2018-10-13 MED ORDER — ARIPIPRAZOLE 2 MG PO TABS
1.0000 mg | ORAL_TABLET | Freq: Every day | ORAL | Status: DC
  Administered 2018-10-13: 1 mg via ORAL
  Filled 2018-10-13: qty 1

## 2018-10-13 MED ORDER — DICYCLOMINE HCL 20 MG PO TABS
20.0000 mg | ORAL_TABLET | Freq: Four times a day (QID) | ORAL | Status: DC | PRN
  Administered 2018-10-14 – 2018-10-15 (×3): 20 mg via ORAL
  Filled 2018-10-13 (×3): qty 1

## 2018-10-13 MED ORDER — ACETAMINOPHEN 325 MG PO TABS
650.0000 mg | ORAL_TABLET | Freq: Four times a day (QID) | ORAL | Status: DC | PRN
  Administered 2018-10-13 (×2): 650 mg via ORAL
  Filled 2018-10-13 (×2): qty 2

## 2018-10-13 MED ORDER — CEFTRIAXONE SODIUM 250 MG IJ SOLR
250.0000 mg | Freq: Once | INTRAMUSCULAR | Status: DC
  Filled 2018-10-13: qty 250

## 2018-10-13 MED ORDER — ACETAMINOPHEN 500 MG PO TABS: 1000.0000 mg | ORAL_TABLET | Freq: Four times a day (QID) | ORAL | Status: DC | PRN

## 2018-10-13 MED ORDER — DICYCLOMINE HCL 10 MG PO CAPS
10.0000 mg | ORAL_CAPSULE | Freq: Once | ORAL | Status: AC
  Administered 2018-10-13: 10 mg via ORAL
  Filled 2018-10-13 (×2): qty 1

## 2018-10-13 NOTE — ED Notes (Signed)
Pt. Being uncooperative and took self off the monitor, throwing her blood pressure cuff onto the floor because she wanted pain medication for her pelvic pain. Pt. Stated that "if we don't do anything she isn't participating in her care." RN,Ilene made aware.

## 2018-10-13 NOTE — ED Notes (Signed)
[place] transportation called  
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
## 2018-10-13 NOTE — Progress Notes (Signed)
Patient ID: Tonya Davidson, female   DOB: 1991/09/12, 28 y.o.   MRN: [id number] D: Patient arrived to [facility] Adult unit from [facility] voluntarily. Patient has been using oxycodone from a script she received back in October 2019, 8-12 tablets for the past 3 days. She reports withdrawal symptoms of "hot and cold" and diarrhea/abdominal cramps as her only symptoms. She is refusing medication to treat the diarrhea and abdominal spasms. She states she has PID and needs pain medication such as tramadol or something stronger. She received a pelvic exam in the ED, but refused a pelvic ultrasound. They confirmed tenderness and swelling and recommended starting on doxycycline, but she refused requesting pain medication per the ED note. She did report that her use of oxycodone was a suicide attempt in the ED, but later denied it. She denied it on [facility] admission. Shortly after admission, she signed a 72 hr request for discharge at 1631 1/26, as she felt that she was not receiving appropriate pain management. A: Obtained UA/UDS/Upreg. Skin assessment performed per protocol, no contraband noted, and revealed multiple tattoos, skin otherwise remarkable. Patient had belongings placed in locker. Unit orientation completed. Care plan and unit routines reviewed with patient, understanding verbalized. Emotional support offered to patient. Encouraged patient to voice concerns. Fluids offered to patient. Q15 minute checks initiated for safety on and off unit.  R: Patient presenting feeling hopeless about pain management. Will monitor closely.

## 2018-10-13 NOTE — ED Notes (Signed)
Transported to [facility] by [facility]. All belongings returned to pt who signed for same. Pt was cooperative and happy to be leaving.

## 2018-10-13 NOTE — Progress Notes (Signed)
Received Tonya Davidson from the main ED, loud, angry and demanding to have medication for her withdrawal symptoms.She was offered a sleeping pill, she refused. Then she threaten to bang her head on the wall. She laid down in her bed and was quiet. Later she requested the sleeping pill and tylenol per order.

## 2018-10-13 NOTE — Progress Notes (Signed)
Patient did attend the evening speaker AA meeting.  

## 2018-10-13 NOTE — Progress Notes (Signed)
Patient has been accepted to [facility] for today 1/26- patient can be transferred at anytime.   Accepting physician: Dr. Jama Flavors  Room: 304-2 Report: [phone]  Patient signed voluntary admission paperwork  Stacy Gardner, LCSW Clinical Social Worker  System Wide Float  [phone]

## 2018-10-13 NOTE — ED Notes (Signed)
Patient requesting something from abd cramping and heartburn. Refused Maalox.

## 2018-10-13 NOTE — ED Notes (Signed)
Report given.

## 2018-10-13 NOTE — ED Provider Notes (Signed)
Patient to ED with c/o Pain to pelvis and back, relates this to history of interstitial cystitis Taking more of meds than she should She denies suicidal or self harm, however, prior to arrival here she went to [facility] stating she was suicidal and took intentional overdose and was sent for medical clearance.  Per Poison Control - monitor for 6 hours (ingestion at 4:00 - obs period til 10:00) Looking for profound hypotension, prolonged QT On pelvic has discharge and tenderness - treated here for PID, will need Doxycycline x 7 days on discharge/admission H/O IC but will obtain US pelvis to eval: PID, TOA  Patient refusing [place]. Demanding pain medications. Is continuously belligerent and verbally abusive to staff.   Blood pressure improves to 98/64. Past blood pressures documented at 105/70.   Patient making threats to leave the hospital. Feel IVC is indicated as patient is considered a danger to herself with intentional overdose earlier today.   EKG to be reviewed to insure no QTc prolongation as per poison control recommendation.  EKG without QTc prolongation. She is cleared to go to psych unit, IVC papers filed, holding orders placed.     Elpidio Anis, PA-C 10/13/18 Jesusita Oka    Benjiman Core, MD 10/13/18 2256

## 2018-10-13 NOTE — ED Notes (Signed)
Pt refusing to take Rocephin and Azithromycin. Provider to be notified. Pt requesting to have IV removed.

## 2018-10-13 NOTE — ED Notes (Signed)
Patient cooperative, but [place] this morning. States that "this is all a big misunderstanding and I want to go home". Patient informed that team would be rounding shortly. Patient stated "this is the worse hospital".

## 2018-10-13 NOTE — Tx Team (Signed)
Initial Treatment Plan 10/13/2018 5:18 PM Tonya Davidson DPO:[id number]    PATIENT STRESSORS: Financial difficulties Health problems Substance abuse   PATIENT STRENGTHS: General fund of knowledge Supportive family/friends   PATIENT IDENTIFIED PROBLEMS: 1. "Help not to be in pain anymore"  2. Patient declines to share a second goal                   DISCHARGE CRITERIA:  Improved stabilization in mood, thinking, and/or behavior Withdrawal symptoms are absent or subacute and managed without 24-hour nursing intervention  PRELIMINARY DISCHARGE PLAN: Attend 12-step recovery group  PATIENT/FAMILY INVOLVEMENT: This treatment plan has been presented to and reviewed with the patient, Tonya Davidson.  The patient has given the opportunity to ask questions and make suggestions.  Kirstie Mirza, RN 10/13/2018, 5:18 PM

## 2018-10-14 DIAGNOSIS — F1124 Opioid dependence with opioid-induced mood disorder: Secondary | ICD-10-CM

## 2018-10-14 DIAGNOSIS — R45851 Suicidal ideations: Secondary | ICD-10-CM

## 2018-10-14 LAB — GC/CHLAMYDIA PROBE AMP (~~LOC~~) NOT AT ARMC
Chlamydia: NEGATIVE
Neisseria Gonorrhea: NEGATIVE

## 2018-10-14 MED ORDER — ACETAMINOPHEN 500 MG PO TABS
500.0000 mg | ORAL_TABLET | Freq: Once | ORAL | Status: AC
  Administered 2018-10-14: 500 mg via ORAL
  Filled 2018-10-14 (×2): qty 1

## 2018-10-14 MED ORDER — IBUPROFEN 400 MG PO TABS: 400.0000 mg | ORAL_TABLET | ORAL | Status: DC | PRN

## 2018-10-14 MED ORDER — ACETAMINOPHEN 325 MG PO TABS
650.0000 mg | ORAL_TABLET | Freq: Four times a day (QID) | ORAL | Status: DC | PRN
  Administered 2018-10-14 – 2018-10-15 (×3): 650 mg via ORAL
  Filled 2018-10-14 (×3): qty 2

## 2018-10-14 MED ORDER — QUETIAPINE FUMARATE 50 MG PO TABS
ORAL_TABLET | ORAL | Status: AC
  Administered 2018-10-14
  Filled 2018-10-14: qty 1

## 2018-10-14 MED ORDER — CLONAZEPAM 0.5 MG PO TABS
0.5000 mg | ORAL_TABLET | Freq: Two times a day (BID) | ORAL | Status: DC | PRN
  Administered 2018-10-14 – 2018-10-15 (×3): 0.5 mg via ORAL
  Filled 2018-10-14 (×3): qty 1

## 2018-10-14 MED ORDER — ACETAMINOPHEN 500 MG PO TABS
1000.0000 mg | ORAL_TABLET | Freq: Three times a day (TID) | ORAL | Status: DC | PRN
  Administered 2018-10-14 (×2): 1000 mg via ORAL
  Filled 2018-10-14 (×2): qty 2

## 2018-10-14 MED ORDER — QUETIAPINE FUMARATE 50 MG PO TABS
50.0000 mg | ORAL_TABLET | Freq: Once | ORAL | Status: AC
  Administered 2018-10-14: 50 mg via ORAL
  Filled 2018-10-14 (×2): qty 1

## 2018-10-14 NOTE — Progress Notes (Signed)
Patient ID: Tonya Davidson, female   DOB: [birth year], 29 y.o.   MRN: [id number] D: Patient observed playing card with a peer in the dayroom on approach. Pt preoccupied with rt upper tooth pain rating it 10 out of 10. Pt reports the tooth is infected and need to be extracted after discharge. Pt stated she is scheduled to d/c tomorrow. Pt denies any withdrawal symptoms. Denies  SI/HI/AVH.No behavioral issues noted.  A: Support and encouragement offered as needed to express needs. Medications administered as prescribed.  R: Patient is safe and cooperative on unit. Will continue to monitor  for safety and stability.

## 2018-10-14 NOTE — [facility] Group Notes (Signed)
LCSW Group Therapy Note   10/14/2018 1:15pm   Type of Therapy and Topic:  Group Therapy:  Overcoming Obstacles   Participation Level:  Active   Description of Group:    In this group patients will be encouraged to explore what they see as obstacles to their own wellness and recovery. They will be guided to discuss their thoughts, feelings, and behaviors related to these obstacles. The group will process together ways to cope with barriers, with attention given to specific choices patients can make. Each patient will be challenged to identify changes they are motivated to make in order to overcome their obstacles. This group will be process-oriented, with patients participating in exploration of their own experiences as well as giving and receiving support and challenge from other group members.   Therapeutic Goals: 1. Patient will identify personal and current obstacles as they relate to admission. 2. Patient will identify barriers that currently interfere with their wellness or overcoming obstacles.  3. Patient will identify feelings, thought process and behaviors related to these barriers. 4. Patient will identify two changes they are willing to make to overcome these obstacles:      Summary of Patient Progress   Tonya Davidson was attentive and engaged during today' processing group. She continues to be focused on discharge and shared "I don't know why I'm here still. That's my obstacle." Tonya Davidson was mildly agitated as she stated she had not yet seen a doctor and had a negative experience with her night shift RN. Tonya Davidson continues to demonstrate limited insight. She also thinks that she has the flu and is wanting to go home to take care of herself. MD notified.    Therapeutic Modalities:   Cognitive Behavioral Therapy Solution Focused Therapy Motivational Interviewing Relapse Prevention Therapy  Rona Ravens, LCSW 10/14/2018 11:00 AM

## 2018-10-14 NOTE — [facility] Suicide Risk Assessment (Addendum)
[facility] Admission Suicide Risk Assessment   Nursing information obtained from:  Patient Demographic factors:  Caucasian, Adolescent or young adult, Living alone Current Mental Status:  NA Loss Factors:  Financial problems / change in socioeconomic status Historical Factors:  Impulsivity(abusing oxycodone to treat chronic pain) Risk Reduction Factors:  Positive social support, Positive coping skills or problem solving skills  Total Time spent with patient: 30 minutes Principal Problem: Opiate use disorder  Diagnosis:  Active Problems:   Major depressive disorder, recurrent severe without psychotic features (HCC)  Subjective Data:   Continued Clinical Symptoms:  Alcohol Use Disorder Identification Test Final Score (AUDIT): 0 The "Alcohol Use Disorders Identification Test", Guidelines for Use in Primary Care, Second Edition.  World Science writer [facility]). Score between 0-7:  no or low risk or alcohol related problems. Score between 8-15:  moderate risk of alcohol related problems. Score between 16-19:  high risk of alcohol related problems. Score 20 or above:  warrants further diagnostic evaluation for alcohol dependence and treatment.   CLINICAL FACTORS:  28 year old single female, presented voluntarily with sponsor.  As per chart, patient had reported suicidal ideations and overdosing on clonazepam and on Lucemyra (a medication which have been prescribed by her pain specialist several months prior to a sister with opiate withdrawal but which she had not been taking recently).  Patient was initially found to be drowsy and hypotensive. At this time patient denies any suicidal plan or intent, and explains as follows: Reports that after a period of 16 months of sobriety, had relapsed on opiate analgesics about a week ago.  She states that on day of admission she had taken Lucemyra along with 4 Klonopin tablets in an attempt to minimize possible opiate withdrawal symptoms and to get some sleep.   She states "I was definitely not trying to kill myself".  She endorses recent anxiety regarding financial stressors and recent relapse but denies any significant neurovegetative symptoms or any suicidal ideations.  She describes a history of depression , anxiety, PTSD symptoms, states she has been psychiatrically stable on low-dose Abilify, low-dose Zoloft, Klonopin 0.5 mg twice daily, which she denies abusing or misusing, and Remeron 15 mg nightly.  States she has tolerated these medications well.     Psychiatric Specialty Exam: Physical Exam  ROS  Blood pressure 94/68, pulse 75, temperature 97.8 F (36.6 C), temperature source Oral, resp. rate 16, height 5\' 4"  (1.626 m), weight 59.9 kg, last menstrual period 09/29/2018, SpO2 100 %.Body mass index is 22.66 kg/m.  See admit note MSE  COGNITIVE FEATURES THAT CONTRIBUTE TO RISK:  Closed-mindedness and Loss of executive function    SUICIDE RISK:   Moderate:  Frequent suicidal ideation with limited intensity, and duration, some specificity in terms of plans, no associated intent, good self-control, limited dysphoria/symptomatology, some risk factors present, and identifiable protective factors, including available and accessible social support.  PLAN OF CARE: Patient will be admitted to inpatient psychiatric unit for stabilization and safety. Will provide and encourage milieu participation. Provide medication management and maked adjustments as needed.  Will follow daily.    I certify that inpatient services furnished can reasonably be expected to improve the patient's condition.   Craige Cotta, MD 10/14/2018, 3:52 PM

## 2018-10-14 NOTE — Plan of Care (Signed)
Progress note  D: pt found in the hallway; pt compliant with medication. Pt is still needy and med seeking. Pt states she is having generalized pain, and tooth pain that she rates at a 10/10. Pt states she slept poorly. Pt rates her depression/hopelessness/anxiety a 0/0/0 out of 10 respectively. Pt denies any other physical symptoms. Pt placed a "gabe" on the do not call list. Pt states her goal for today is to put smiles on everyone's "friggin" face and she will achieve this being happy and don't get discouraged. Pt denies si/hi/ah/vh and verbally agrees to approach staff if these become apparent or before harming herself or others while at [facility]. A: pt provided support and encouragement. Pt given medication per protocol and standing orders. Q63m safety checks implemented and continued.  R: pt safe on the unit. Will continue to monitor.   Pt progressing in the following metrics  Problem: Education: Goal: Ability to make informed decisions regarding treatment will improve Outcome: Progressing   Problem: Health Behavior/Discharge Planning: Goal: Identification of resources available to assist in meeting health care needs will improve Outcome: Progressing   Problem: Medication: Goal: Compliance with prescribed medication regimen will improve Outcome: Progressing   Problem: Self-Concept: Goal: Ability to disclose and discuss suicidal ideas will improve Outcome: Progressing

## 2018-10-14 NOTE — Progress Notes (Signed)
Recreation Therapy Notes  Date:  1.27.20 Time: 0930 Location: 300 [address] Dayroom  Group Topic: Stress Management  Goal Area(s) Addresses:  Patient will identify positive stress management techniques. Patient will identify benefits of using stress management post d/c.  Behavioral Response:  Engaged  Intervention:  Stress Management  Activity :  Meditation.  LRT introduced stress management technique of meditation.  LRT played Davidson meditation that focused on the possibilities the day could bring.  Patients were to follow along as meditation played in order to engage in meditation.  Education:  Stress Management, Discharge Planning.   Education Outcome: Acknowledges Education  Clinical Observations/Feedback: Pt attended and participated in activity.    Tonya Davidson, LRT/CTRS         Tonya Davidson, Tonya Davidson 10/14/2018 12:15 PM 

## 2018-10-14 NOTE — Progress Notes (Signed)
Patient has been constantly up at nursing station since shift change and again shortly after group. She received all medications at beginning of shift change to aid with her symptoms and was informed of what was available at hs. She took those medications after group and came back up c/o insomnia. Writer received order for 1 time dose of seroquel and she later returned c/o pain on her assessed tooth. Patient requested motrin but when writer explained to her that it was listed on her allergy list she became argumentative and request it to be given or she needed to be sent to the ED. Heat packs were offered and at first she declined but then changed her mind.Patient later received tylenol 1,000 mg and was encouraged to rest so that her roommate would not continually be disturbed by her getting up and down. Patient has med-seeking tendencies and gets upset quickly when she is not able to receive medications she request. Safety maintained with 15 min checks.
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
## 2018-10-14 NOTE — Progress Notes (Signed)
Did not attend group 

## 2018-10-14 NOTE — Tx Team (Signed)
Interdisciplinary Treatment and Diagnostic Plan Update  10/14/2018 Time of Session: 0830AM Tonya Davidson MRN: [id number]  Principal Diagnosis: MDD, recurrent, severe, without psychotic features  Secondary Diagnoses: Active Problems:   Major depressive disorder, recurrent severe without psychotic features (HCC)   Current Medications:  Current Facility-Administered Medications  Medication Dose Route Frequency Provider Last Rate Last Dose  . acetaminophen (TYLENOL) tablet 1,000 mg  1,000 mg Oral Q8H PRN Nira Conn A, NP   1,000 mg at 10/14/18 0744  . alum & mag hydroxide-simeth (MAALOX/MYLANTA) 200-200-20 MG/5ML suspension 30 mL  30 mL Oral Q4H PRN Charm Rings, NP      . ARIPiprazole (ABILIFY) tablet 1 mg  1 mg Oral Daily Charm Rings, NP   1 mg at 10/14/18 0744  . dicyclomine (BENTYL) tablet 20 mg  20 mg Oral Q6H PRN Money, Gerlene Burdock, FNP   20 mg at 10/14/18 9458  . drospirenone-ethinyl estradiol (YAZ,GIANVI,LORYNA) 3-0.02 MG per tablet 1 tablet  1 tablet Oral Daily Nanine Means Y, NP      . hydrOXYzine (ATARAX/VISTARIL) tablet 25 mg  25 mg Oral Q6H PRN Money, Gerlene Burdock, FNP   25 mg at 10/13/18 2002  . loperamide (IMODIUM) capsule 2-4 mg  2-4 mg Oral PRN Money, Gerlene Burdock, FNP   4 mg at 10/14/18 5929  . LORazepam (ATIVAN) tablet 1 mg  1 mg Oral Q6H PRN Money, Gerlene Burdock, FNP   1 mg at 10/13/18 2121  . magnesium hydroxide (MILK OF MAGNESIA) suspension 30 mL  30 mL Oral Daily PRN Charm Rings, NP      . methocarbamol (ROBAXIN) tablet 500 mg  500 mg Oral Q8H PRN Money, Gerlene Burdock, FNP   500 mg at 10/13/18 2002  . mirtazapine (REMERON) tablet 15 mg  15 mg Oral QHS Charm Rings, NP   15 mg at 10/13/18 2121  . multivitamin with minerals tablet 1 tablet  1 tablet Oral Daily Money, Travis B, FNP      . ondansetron (ZOFRAN-ODT) disintegrating tablet 4 mg  4 mg Oral Q6H PRN Money, Gerlene Burdock, FNP      . sertraline (ZOLOFT) tablet 12.5 mg  12.5 mg Oral Daily Charm Rings, NP   12.5 mg at  10/14/18 0744  . thiamine (B-1) injection 100 mg  100 mg Intramuscular Once Money, Feliz Beam B, FNP      . thiamine (VITAMIN B-1) tablet 100 mg  100 mg Oral Daily Money, Gerlene Burdock, FNP       PTA Medications: Medications Prior to Admission  Medication Sig Dispense Refill Last Dose  . acetaminophen (TYLENOL) 500 MG tablet Take 1,500 mg by mouth every 6 (six) hours as needed for moderate pain.   Past Week at Unknown time  . ARIPiprazole (ABILIFY) 2 MG tablet Take 1 mg by mouth daily.   10/11/2018 at Unknown time  . clonazePAM (KLONOPIN) 0.5 MG tablet Take 0.5 mg by mouth 2 (two) times daily.  0 10/12/2018 at Unknown time  . cyclobenzaprine (FLEXERIL) 5 MG tablet Take 1 tablet (5 mg total) 2 (two) times daily as needed by mouth for muscle spasms. (Patient not taking: Reported on 01/18/2018) 15 tablet 0 Not Taking at Unknown time  . drospirenone-ethinyl estradiol (YAZ,GIANVI,LORYNA) 3-0.02 MG tablet Take 1 tablet by mouth daily.   10/11/2018 at Unknown time  . ibuprofen (ADVIL,MOTRIN) 200 MG tablet Take 400 mg by mouth every 6 (six) hours as needed for moderate pain.    Past Week at  Unknown time  . ibuprofen (ADVIL,MOTRIN) 600 MG tablet Take 1 tablet (600 mg total) every 6 (six) hours as needed by mouth. (Patient not taking: Reported on 01/18/2018) 30 tablet 0 Not Taking at Unknown time  . lidocaine (XYLOCAINE) 2 % jelly Apply 1 application topically as needed. (Patient not taking: Reported on 07/21/2018) 30 mL 0 Not Taking at Unknown time  . mirtazapine (REMERON) 15 MG tablet Take 15 mg by mouth at bedtime.   5 10/11/2018 at Unknown time  . nitrofurantoin, macrocrystal-monohydrate, (MACROBID) 100 MG capsule Take 1 capsule (100 mg total) by mouth 2 (two) times daily. (Patient not taking: Reported on 10/12/2018) 14 capsule 0 Completed Course at Unknown time  . ondansetron (ZOFRAN ODT) 4 MG disintegrating tablet Take 1 tablet (4 mg total) by mouth every 8 (eight) hours as needed for nausea or vomiting. (Patient not  taking: Reported on 01/18/2018) 20 tablet 0 Not Taking at Unknown time  . oxyCODONE-acetaminophen (PERCOCET/ROXICET) 5-325 MG tablet Take 1-2 tablets by mouth every 6 (six) hours as needed for severe pain. (Patient not taking: Reported on 01/18/2018) 6 tablet 0 Not Taking at Unknown time  . penicillin v potassium (VEETID) 500 MG tablet Take 2 tablets (1,000 mg total) by mouth 2 (two) times daily. X 7 days (Patient not taking: Reported on 10/12/2018) 28 tablet 0 Not Taking at Unknown time  . promethazine (PHENERGAN) 25 MG tablet Take 1 tablet (25 mg total) by mouth every 6 (six) hours as needed for nausea or vomiting. (Patient not taking: Reported on 10/12/2018) 8 tablet 0 Completed Course at Unknown time  . sertraline (ZOLOFT) 25 MG tablet Take 12.5 mg by mouth daily.   5 10/11/2018 at Unknown time    Patient Stressors: Financial difficulties Health problems Substance abuse  Patient Strengths: General fund of knowledge Supportive family/friends  Treatment Modalities: Medication Management, Group therapy, Case management,  1 to 1 session with clinician, Psychoeducation, Recreational therapy.   Physician Treatment Plan for Primary Diagnosis: MDD, recurrent, severe, without psychotic features  Medication Management: Evaluate patient's response, side effects, and tolerance of medication regimen.  Therapeutic Interventions: 1 to 1 sessions, Unit Group sessions and Medication administration.  Evaluation of Outcomes: Progressing  Physician Treatment Plan for Secondary Diagnosis: Active Problems:   Major depressive disorder, recurrent severe without psychotic features (HCC)   Medication Management: Evaluate patient's response, side effects, and tolerance of medication regimen.  Therapeutic Interventions: 1 to 1 sessions, Unit Group sessions and Medication administration.  Evaluation of Outcomes: Progressing   RN Treatment Plan for Primary Diagnosis: MDD, recurrent, severe, without psychotic  features Long Term Goal(s): Knowledge of disease and therapeutic regimen to maintain health will improve  Short Term Goals: Ability to remain free from injury will improve, Ability to verbalize frustration and anger appropriately will improve, Ability to demonstrate self-control and Ability to disclose and discuss suicidal ideas  Medication Management: RN will administer medications as ordered by provider, will assess and evaluate patient's response and provide education to patient for prescribed medication. RN will report any adverse and/or side effects to prescribing provider.  Therapeutic Interventions: 1 on 1 counseling sessions, Psychoeducation, Medication administration, Evaluate responses to treatment, Monitor vital signs and CBGs as ordered, Perform/monitor CIWA, COWS, AIMS and Fall Risk screenings as ordered, Perform wound care treatments as ordered.  Evaluation of Outcomes: Progressing   LCSW Treatment Plan for Primary Diagnosis:MDD, recurrent, severe, without psychotic features Long Term Goal(s): Safe transition to appropriate next level of care at discharge, Engage patient in therapeutic  group addressing interpersonal concerns.  Short Term Goals: Engage patient in aftercare planning with referrals and resources, Facilitate patient progression through stages of change regarding substance use diagnoses and concerns and Identify triggers associated with mental health/substance abuse issues  Therapeutic Interventions: Assess for all discharge needs, 1 to 1 time with Social worker, Explore available resources and support systems, Assess for adequacy in community support network, Educate family and significant other(s) on suicide prevention, Complete Psychosocial Assessment, Interpersonal group therapy.  Evaluation of Outcomes: Progressing  Progress in Treatment: Attending groups: Yes. Participating in groups: Yes. Taking medication as prescribed: Yes. Toleration medication:  Yes. Family/Significant other contact made: No, will contact:  pt's boyfriend for SPE and collateral contact (with pt consent).  Patient understands diagnosis: Yes. Discussing patient identified problems/goals with staff: Yes. Medical problems stabilized or resolved: Yes. Denies suicidal/homicidal ideation: Yes. Issues/concerns per patient self-inventory: No. Other: n/a   New problem(s) identified: No, Describe:  n/a  New Short Term/Long Term Goal(s): detox, medication management for mood stabilization; elimination of SI thoughts; development of comprehensive mental wellness/sobriety plan.   Patient Goals:  "I need help to manage my pain."   Discharge Plan or Barriers: CSW assessing for appropriate referrals. Pt agreeable to [facility] referral (she reports that she does not have insurance). MHAG pamphlet, Mobile Crisis information, and AA/NA information provided to patient for additional community support and resources. Pt is active in NA and has a sponsor.   Reason for Continuation of Hospitalization: Anxiety Depression Medication stabilization Other; describe chronic pain issues  Estimated Length of Stay: Tuesday, 10/15/2018  Attendees: Patient: Tonya Davidson 10/14/2018 10:21 AM  Physician: Dr. Jama Flavors MD 10/14/2018 10:21 AM  Nursing: Arlyss Repress RN; Casimiro Needle RN 10/14/2018 10:21 AM  RN Care Manager:x 10/14/2018 10:21 AM  Social Worker: Corrie Mckusick LCSW 10/14/2018 10:21 AM  Recreational Therapist: x 10/14/2018 10:21 AM  Other: Corrie Mckusick LCSW 10/14/2018 10:21 AM  Other:  10/14/2018 10:21 AM  Other: 10/14/2018 10:21 AM    Scribe for Treatment Team: Rona Ravens, LCSW 10/14/2018 10:21 AM

## 2018-10-15 DIAGNOSIS — F332 Major depressive disorder, recurrent severe without psychotic features: Principal | ICD-10-CM

## 2018-10-15 MED ORDER — SERTRALINE HCL 25 MG PO TABS: 12.5000 mg | ORAL_TABLET | Freq: Every day | ORAL | 0 refills | Status: DC

## 2018-10-15 MED ORDER — HYDROXYZINE HCL 25 MG PO TABS: 25.0000 mg | ORAL_TABLET | Freq: Four times a day (QID) | ORAL | 0 refills | Status: AC | PRN

## 2018-10-15 MED ORDER — CLONAZEPAM 0.5 MG PO TABS: 0.5000 mg | ORAL_TABLET | Freq: Two times a day (BID) | ORAL | 0 refills | Status: AC | PRN

## 2018-10-15 MED ORDER — ACETAMINOPHEN 325 MG PO TABS: 650.0000 mg | ORAL_TABLET | Freq: Four times a day (QID) | ORAL | 0 refills | Status: AC | PRN

## 2018-10-15 MED ORDER — BENZOCAINE 10 % MT GEL
Freq: Four times a day (QID) | OROMUCOSAL | Status: DC | PRN
  Administered 2018-10-15: 01:00:00 via OROMUCOSAL
  Filled 2018-10-15: qty 9.4

## 2018-10-15 MED ORDER — MIRTAZAPINE 15 MG PO TABS: 15.0000 mg | ORAL_TABLET | Freq: Every day | ORAL | 0 refills | Status: DC

## 2018-10-15 MED ORDER — BENZOCAINE 10 % MT GEL: Freq: Four times a day (QID) | OROMUCOSAL | 0 refills | Status: DC | PRN

## 2018-10-15 MED ORDER — ARIPIPRAZOLE 2 MG PO TABS: 1.0000 mg | ORAL_TABLET | Freq: Every day | ORAL | 0 refills | Status: DC

## 2018-10-15 NOTE — Progress Notes (Signed)
  [facility] Adult Case Management Discharge Plan :  Will you be returning to the same living situation after discharge:  Yes,  home At discharge, do you have transportation home?: Yes,  boyfriend picking up Do you have the ability to pay for your medications: Yes,  income from employment  Release of information consent forms completed and in the chart.  Patient to Follow up at: Follow-up Information    [facility] Follow up on 10/23/2018.   Specialty:  Behavioral Health Why:  Hospital follow up appointment is 2/5 at 8:30a. Please bring your photo ID, proof of insurance, SSN, current medications, and discharge paperwork from this hospitalization.  Contact information: [address] [place] [address] [phone]           Next level of care provider has access to [facility] Link:no  Safety Planning and Suicide Prevention discussed: Yes,  with patient  Have you used any form of tobacco in the last 30 days? (Cigarettes, Smokeless Tobacco, Cigars, and/or Pipes): Yes  Has patient been referred to the Quitline?: Patient refused referral  Patient has been referred for addiction treatment: Yes  Darreld Mclean, LCSWA 10/15/2018, 9:43 AM
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
## 2018-10-15 NOTE — [facility] Suicide Risk Assessment (Signed)
[facility] Discharge Suicide Risk Assessment   Principal Problem: <principal problem not specified> Discharge Diagnoses: Active Problems:   Major depressive disorder, recurrent severe without psychotic features (HCC)   Total Time spent with patient: 30 minutes  Musculoskeletal: Strength & Muscle Tone: within normal limits Gait & Station: normal Patient leans: N/A  Psychiatric Specialty Exam: Review of Systems  All other systems reviewed and are negative.   Blood pressure 107/80, pulse 95, temperature 97.8 F (36.6 C), temperature source Oral, resp. rate 16, height 5\' 4"  (1.626 m), weight 59.9 kg, last menstrual period 09/29/2018, SpO2 100 %.Body mass index is 22.66 kg/m.  General Appearance: Casual  Eye Contact::  Good  Speech:  Normal Rate409  Volume:  Normal  Mood:  Anxious  Affect:  Congruent  Thought Process:  Coherent and Descriptions of Associations: Intact  Orientation:  Full (Time, Place, and Person)  Thought Content:  Logical  Suicidal Thoughts:  No  Homicidal Thoughts:  No  Memory:  Immediate;   Good Recent;   Good Remote;   Good  Judgement:  Intact  Insight:  Good  Psychomotor Activity:  Increased  Concentration:  Good  Recall:  Good  Fund of Knowledge:Good  Language: Good  Akathisia:  Negative  Handed:  Right  AIMS (if indicated):     Assets:  Communication Skills Desire for Improvement Housing Leisure Time Physical Health Resilience  Sleep:  Number of Hours: 4.25  Cognition: WNL  ADL's:  Intact   Mental Status Per Nursing Assessment::   On Admission:  NA  Demographic Factors:  Caucasian, Low socioeconomic status and Unemployed  Loss Factors: NA  Historical Factors: Impulsivity  Risk Reduction Factors:   Positive social support and Positive coping skills or problem solving skills  Continued Clinical Symptoms:  Depression:   Comorbid alcohol abuse/dependence Impulsivity Alcohol/Substance Abuse/Dependencies  Cognitive Features That  Contribute To Risk:  None    Suicide Risk:  Minimal: No identifiable suicidal ideation.  Patients presenting with no risk factors but with morbid ruminations; may be classified as minimal risk based on the severity of the depressive symptoms  Follow-up Information    [facility] Follow up on 10/23/2018.   Specialty:  Behavioral Health Why:  Hospital follow up appointment is 2/5 at 8:30a. Please bring your photo ID, proof of insurance, SSN, current medications, and discharge paperwork from this hospitalization.  Contact information: [address] [place] [address] [phone]           Plan Of Care/Follow-up recommendations:  Activity:  ad lib  Antonieta Pert, MD 10/15/2018, 8:13 AM

## 2018-10-15 NOTE — [facility] Suicide Risk Assessment (Signed)
[facility] INPATIENT:  Family/Significant Other Suicide Prevention Education  Suicide Prevention Education:  Patient Refusal for Family/Significant Other Suicide Prevention Education: The patient Tonya Davidson has refused to provide written consent for family/significant other to be provided Family/Significant Other Suicide Prevention Education during admission and/or prior to discharge.  Physician notified.  SPE completed with pt, as pt refused to consent to family contact. SPI pamphlet provided to pt and pt was encouraged to share information with support network, ask questions, and talk about any concerns relating to SPE. Pt denies access to guns/firearms and verbalized understanding of information provided. Mobile Crisis information also provided to pt.   Rona Ravens LCSW 10/15/2018, 8:39 AM

## 2018-10-15 NOTE — Discharge Summary (Signed)
Physician Discharge Summary Note  Patient:  Tonya [facility] is an 28 y.o., female  MRN:  [id number]  DOB:  1990-11-20  Patient phone:  [phone] (home)   Patient address:   [address] [place] [address],   Total Time spent with patient: Greater than 30 minutes  Date of Admission:  10/13/2018  Date of Discharge: 10-15-18  Reason for Admission: Suicide attempt by overdose & recent relapse on drugs.  Principal Problem: Major depressive disorder, recurrent severe without psychotic features [facility])  Discharge Diagnoses: Principal Problem:   Major depressive disorder, recurrent severe without psychotic features (HCC) Active Problems:   GAD (generalized anxiety disorder)  Past Psychiatric History: Major depressive disorder.  Past Medical History:  Past Medical History:  Diagnosis Date  . Abnormal Pap smear    colposcopy  . Anemia   . Anxiety   . Arthritis   . Bipolar 1 disorder (HCC)   . C. difficile diarrhea   . Chlamydia 04/29/2012  . Chronic pain   . Depression   . Fibromyalgia   . HPV in female   . IBS (irritable bowel syndrome)   . Interstitial cystitis   . Kidney stones   . Mononucleosis   . Pelvic pain in female 12/14/2014  . PID (acute pelvic inflammatory disease) 04/28/2012  . Ulcer     Past Surgical History:  Procedure Laterality Date  . COLPOSCOPY    . INNER EAR SURGERY    . KIDNEY STONE SURGERY    . LAPAROSCOPIC CHOLECYSTECTOMY    . TONSILLECTOMY    . WISDOM TOOTH EXTRACTION     Family History:  Family History  Problem Relation Age of Onset  . Bipolar disorder Mother   . Cancer Father   . ADD / ADHD Brother   . Cancer Maternal Grandmother   . Cancer Maternal Grandfather   . Cancer Paternal Grandmother   . Cancer Paternal Grandfather    Family Psychiatric  History: See H&P.  Social History:  Social History   Substance and Sexual Activity  Alcohol Use No     Social History   Substance and Sexual Activity  Drug Use Yes   . Types: Marijuana   Comment: quit in June 2015 per patient     Social History   Socioeconomic History  . Marital status: Single    Spouse name: Not on file  . Number of children: Not on file  . Years of education: Not on file  . Highest education level: Not on file  Occupational History  . Not on file  Social Needs  . Financial resource strain: Not on file  . Food insecurity:    Worry: Not on file    Inability: Not on file  . Transportation needs:    Medical: Not on file    Non-medical: Not on file  Tobacco Use  . Smoking status: Current Every Day Smoker    Packs/day: 0.50    Years: 5.00    Pack years: 2.50    Types: Cigarettes  . Smokeless tobacco: Never Used  Substance and Sexual Activity  . Alcohol use: No  . Drug use: Yes    Types: Marijuana    Comment: quit in June 2015 per patient   . Sexual activity: Not Currently    Birth control/protection: None, Pill  Lifestyle  . Physical activity:    Days per week: Not on file    Minutes per session: Not on file  . Stress: Not on file  Relationships  . Social connections:    Talks on phone: Not on file    Gets together: Not on file    Attends religious service: Not on file    Active member of club or organization: Not on file    Attends meetings of clubs or organizations: Not on file    Relationship status: Not on file  Other Topics Concern  . Not on file  Social History Narrative   ** [facility]       [facility] Course: (Per Md's admission evaluation): 28 year old single female, [facility] with [facility] sponsor related to concerns about suicidal ideations/overdose.  As per chart notes patient had reported suicidal ideations and overdosing on 4 Klonopin tablets as well as a "handful of Lucemyra" (reports she had been prescribed this medication in the past by her pain specialist to assist her with opiate detox, but had not been taking recently, " had some left ". At this time patient reports  that the above is a misunderstanding and she categorically denies any suicidal plan or intention. She explains she has a  history of opiate dependence, and had relapsed on oxycodone about a week ago following a period of  16 months of sobriety. States she recently resumed Lucemyra from a prior prescription she had, in an attempt to avoid withdrawal symptoms.  States "it works very well but it does make me tired and sleepy" ".  She does admit  taking  4 tablets of Klonopin ( 0.5 mgr tablets) prior to admission " to try to get some sleep". States her sponsor was concerned about her and encouraged her to come to [facility],  and that she was initially found to be hypotensive, which she attributes to [place]. Patient reports she has been anxious recently, mainly about financial issues, but denies any significant neuro-vegetative symptoms (other than chronic insomnia).  After the above admission assessment, Tonya AbedLindsay was recommended for mood stabilization treatments based on her presenting symptoms. The medication regimen for her presenting symptoms were discussed & initiated with her consent. She received, stabilized & was discharged on the medications as listed below. She was also enrolled & participated in the group counseling sessions being offered & held on this unit. She learned coping skills.  Tonya [facility]'s symptoms responded well to her treatment regimen. This is evidenced by her reports of improved mood & absence of suicidal thoughts, ideations or plans. She is currently mentally & medically stable to be discharged to continue mental health care on an outpatient basis as noted below.  Tonya AbedLindsay is seen today by his attending psychiatrist for discharge evaluation. She stated that she is pleased that she sought help. Says she is tolerating her medications well. No withdrawal symptoms. No craving for substances. She is no longer feeling depressed. Describes normal energy and ability to think. Able to focus on task.  No suicidal thoughts. No homicidal thoughts. No thoughts of violence. Patient reports normal biological functions.    The nursing staff reports that patient has been appropriate on the unit. Patient has been interacting well with peers & staff. No behavioral issues. Patient has not voiced any suicidal thoughts. Patient has not been observed to be internally stimulated or occupied. Patient has been adherent with treatment recommendations. Patient has been tolerating her medications well, denies any adverse reactions or side effects.    Patient was discussed at the team meeting this morning. Team members feel that patient is back to her baseline level of function. The  team agreed with plan to discharge patient today to continue mental health care on outpatient basis as noted below. Upon discharge, Londynn adamantly denies any SIHI, AVH, delusional thoughts, paranoia or substance withdrawal symptoms. She will continue further psychiatric follow-up care/medication management on an outpatient basis as noted below. She was provided with all the necessary information needed to make this appointment without problems. She was able to engage in safety planning including plan to return to [facility] or contact emergency services if she feels unable to maintain her own safety or the safety of others. Pt had no further questions, comments or concerns. She left [facility] with all personal belongings in no apparent distress.   Physical Findings: AIMS: Facial and Oral Movements Muscles of Facial Expression: None, normal Lips and Perioral Area: None, normal Jaw: None, normal Tongue: None, normal,Extremity Movements Upper (arms, wrists, hands, fingers): None, normal Lower (legs, knees, ankles, toes): None, normal, Trunk Movements Neck, shoulders, hips: None, normal, Overall Severity Severity of abnormal movements (highest score from questions above): None, normal Incapacitation due to abnormal movements: None, normal Patient's  awareness of abnormal movements (rate only patient's report): No Awareness, Dental Status Current problems with teeth and/or dentures?: No Does patient usually wear dentures?: No  CIWA:  CIWA-Ar Total: 3 COWS:  COWS Total Score: 2  Musculoskeletal: Strength & Muscle Tone: within normal limits Gait & Station: normal Patient leans: N/A  Psychiatric Specialty Exam: Physical Exam  Nursing note and vitals reviewed. Constitutional: She is oriented to person, place, and time. She appears well-developed.  HENT:  Head: Normocephalic.  Eyes: Pupils are equal, round, and reactive to light.  Neck: Normal range of motion.  Cardiovascular: Normal rate.  Respiratory: Effort normal.  GI: Soft.  Genitourinary:    Genitourinary Comments: Deferred   Musculoskeletal: Normal range of motion.  Neurological: She is alert and oriented to person, place, and time.  Skin: Skin is warm and dry.    Review of Systems  Constitutional: Negative.   HENT: Negative.   Eyes: Negative.   Respiratory: Negative.  Negative for cough and shortness of breath.   Cardiovascular: Negative.  Negative for chest pain and palpitations.  Gastrointestinal: Negative.  Negative for abdominal pain, heartburn, nausea and vomiting.  Genitourinary: Negative.   Musculoskeletal: Negative.   Skin: Negative.   Neurological: Negative.  Negative for dizziness and headaches.  Endo/Heme/Allergies: Negative.   Psychiatric/Behavioral: Positive for depression (Stable) and substance abuse. Negative for hallucinations (Hx. Benzodiazepine use), memory loss and suicidal ideas. The patient has insomnia (Stable). The patient is not nervous/anxious (Stable).     Blood pressure 107/80, pulse 95, temperature 97.8 F (36.6 C), temperature source Oral, resp. rate 16, height 5\' 4"  (1.626 m), weight 59.9 kg, last menstrual period 09/29/2018, SpO2 100 %.Body mass index is 22.66 kg/m.  See Md's discharge SRA   Metabolic Disorder Labs:  No results  found for: HGBA1C, MPG No results found for: PROLACTIN No results found for: CHOL, TRIG, HDL, CHOLHDL, VLDL, LDLCALC  See Psychiatric Specialty Exam and Suicide Risk Assessment completed by Attending Physician prior to discharge.  Discharge destination:  Home  Is patient on multiple antipsychotic therapies at discharge:  No   Has Patient had three or more failed trials of antipsychotic monotherapy by history:  No  Recommended Plan for Multiple Antipsychotic Therapies: NA  Discharge Instructions    Discharge instructions   Complete by:  As directed    Patient is instructed to take all prescribed medications as recommended. Report any side effects or  adverse reactions to your outpatient psychiatrist. Patient is instructed to abstain from alcohol and illegal drugs while on prescription medications. In the event of worsening symptoms, patient is instructed to call the crisis hotline, 911, or go to the nearest emergency department for evaluation and treatment.     Allergies as of 10/15/2018      Reactions   Azithromycin Other (See Comments)   GI upset and abdominal pain   Doxycycline Nausea And Vomiting   Pt states she gets violently sick    Prednisone Other (See Comments)   Impacts Bipolar symptoms    Bactrim [sulfamethoxazole-trimethoprim] Swelling   Flagyl [metronidazole] Nausea And Vomiting   Naproxen Swelling   Toradol [ketorolac Tromethamine] Other (See Comments)   Makes arms tingly   Morphine And Related Palpitations   Minor hives - pt is okay to take, hasn't needed benadryl in past      Medication List    STOP taking these medications   cyclobenzaprine 5 MG tablet Commonly known as:  FLEXERIL   ibuprofen 200 MG tablet Commonly known as:  ADVIL,MOTRIN   ibuprofen 600 MG tablet Commonly known as:  ADVIL,MOTRIN   lidocaine 2 % jelly Commonly known as:  XYLOCAINE   nitrofurantoin (macrocrystal-monohydrate) 100 MG capsule Commonly known as:  MACROBID    ondansetron 4 MG disintegrating tablet Commonly known as:  ZOFRAN ODT   oxyCODONE-acetaminophen 5-325 MG tablet Commonly known as:  PERCOCET/ROXICET   penicillin v potassium 500 MG tablet Commonly known as:  VEETID   promethazine 25 MG tablet Commonly known as:  PHENERGAN     TAKE these medications     Indication  acetaminophen 325 MG tablet Commonly known as:  TYLENOL Take 2 tablets (650 mg total) by mouth every 6 (six) hours as needed for mild pain or moderate pain. (May buy over the counter) What changed:    medication strength  how much to take  reasons to take this  additional instructions  Indication:  Pain   ARIPiprazole 2 MG tablet Commonly known as:  ABILIFY Take 0.5 tablets (1 mg total) by mouth daily. For mood What changed:  additional instructions  Indication:  Mood   benzocaine 10 % mucosal gel Commonly known as:  ORAJEL Use as directed in the mouth or throat 4 (four) times daily as needed for mouth pain. (May buy over the counter)  Indication:  Mouth pain   clonazePAM 0.5 MG tablet Commonly known as:  KLONOPIN Take 1 tablet (0.5 mg total) by mouth 2 (two) times daily as needed (anxiety or insomnia). What changed:    when to take this  reasons to take this  Indication:  Anxiety   drospirenone-ethinyl estradiol 3-0.02 MG tablet Commonly known as:  YAZ,GIANVI,LORYNA Take 1 tablet by mouth daily.  Indication:  Birth Control Treatment   hydrOXYzine 25 MG tablet Commonly known as:  ATARAX/VISTARIL Take 1 tablet (25 mg total) by mouth every 6 (six) hours as needed (anxiety/agitation or CIWA < or = 10).  Indication:  Feeling Anxious   mirtazapine 15 MG tablet Commonly known as:  REMERON Take 1 tablet (15 mg total) by mouth at bedtime. For mood What changed:  additional instructions  Indication:  Mood   sertraline 25 MG tablet Commonly known as:  ZOLOFT Take 0.5 tablets (12.5 mg total) by mouth daily. For mood What changed:  additional  instructions  Indication:  Mood      Follow-up Information    [facility] Follow up on 10/23/2018.   Specialty:  Behavioral Health Why:  [facility] follow up appointment is 2/5 at 8:30a. Please bring your photo ID, proof of insurance, SSN, current medications, and discharge paperwork from this hospitalization.  [address] [place] [address] [phone]          Follow-up recommendations: Activity:  As tolerated Diet: As recommended by your primary care doctor. Keep all scheduled follow-up appointments as recommended.  Comments: Patient is instructed prior to discharge to: Take all medications as prescribed by his/her mental healthcare provider. Report any adverse effects and or reactions from the medicines to his/her outpatient provider promptly. Patient has been instructed & cautioned: To not engage in alcohol and or illegal drug use while on prescription medicines. In the event of worsening symptoms, patient is instructed to call the crisis hotline, 911 and or go to the nearest ED for appropriate evaluation and treatment of symptoms. To follow-up with his/her primary care provider for your other medical issues, concerns and or health care needs.   Signed: Armandina Stammer, NP, PMHNP, FNP-BC 10/15/2018, 2:02 PM
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
## 2018-10-15 NOTE — Progress Notes (Signed)
  [facility] Adult Case Management Discharge Plan :  Will you be returning to the same living situation after discharge:  Yes,  home At discharge, do you have transportation home?: Yes,  boyfriend will pick her up at 12pm Do you have the ability to pay for your medications: Yes,  mental health. Pt states that she does not have BCBS as indicated by chart.   Release of information consent forms completed and submitted to medical records by CSW.  Patient to Follow up at: Follow-up Information    [facility] Follow up on 10/23/2018.   Specialty:  Behavioral Health Why:  Hospital follow up appointment is 2/5 at 8:30a. Please bring your photo ID, proof of insurance, SSN, current medications, and discharge paperwork from this hospitalization.  Contact information: [address] [place] [address] [phone]           Next level of care provider has access to [facility] Link:no  Safety Planning and Suicide Prevention discussed: Yes,  SPE completed with pt; pt declined to consent to collateral contact. SPI pamphlet and mobile crisis information provided.   Have you used any form of tobacco in the last 30 days? (Cigarettes, Smokeless Tobacco, Cigars, and/or Pipes): Yes  Has patient been referred to the Quitline?: Patient refused referral  Patient has been referred for addiction treatment: Yes  Rona Ravens, LCSW 10/15/2018, 9:42 AM
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
## 2018-10-15 NOTE — Progress Notes (Signed)
Patient ID: Tonya [facility], female   DOB: 02/20/1991, 28 y.o.   MRN: [id number]  Discharge Note  D) Patient discharged to lobby. Patient states readiness for discharge. Patient denies SI/HI, AVH and is not delusional or psychotic.   A) Written and verbal discharge instructions given to the patient. Patient accepting to information and verbalized understanding. Patient agrees to the discharge plan. Opportunity for questions and concerns presented to patient. Patient denied any further questions or concerns. All belongings returned to patient. Patient signed for return of belongings and discharge paperwork. Patient has completed their Suicide Safety Plan and has been provided Suicide Prevention Education. Patient provided an opportunity to complete and return Patient Satisfaction Survey.   R) Patient safely escorted to the lobby. Patient discharged from [facility] with medication samples, prescriptions, personal belongings, follow-up appointment in place and discharge paperwork.   **In the search room, patient inquired about a "purse and duffle bag" missing from her belongings. These items were not noted on admission inventory form or on items brought in by visitors. Patient states she had it with her in the emergency room. Patient then states "my friend took some things home, she must have them". Patient declines further concerns. These items were not present in overflow area of search room but these items also were not on listed on admission inventory list.

## 2018-10-15 NOTE — Progress Notes (Signed)
Patient ID: XEE GOARD, female   DOB: 03-30-1991, 30 y.o.   MRN: [id number]  Nursing Progress Note [phone]  Data: Patient is seen up in the dayroom playing cards with peers and is pleasant on approach. Patient completed self-inventory sheet and rates depression, hopelessness, and anxiety 3,0,9 respectively. Patient rates their sleep and appetite as poor/fair respectively. Patient states goal for today is to "get the friggin crap out of here". Patient currently denies SI/HI/AVH. Patient does endorse some anxiety relieved with use of PRN medications. Patient appears free of withdrawal symptoms. Patient is agreeable to discharge plan.  Action: Patient is educated about and provided medication per provider's orders. Patient safety maintained with q15 min safety checks and frequent rounding. Low fall risk precautions in place. Emotional support given. 1:1 interaction and active listening provided. Patient encouraged to attend meals, groups, and work on treatment plan and goals. Labs, vital signs and patient behavior monitored throughout shift.   Response: Patient remains safe on the unit at this time and agrees to come to staff with any issues/concerns. Patient is interacting with peers appropriately on the unit. Will continue to support and monitor.
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
## 2018-11-17 ENCOUNTER — Other Ambulatory Visit: Payer: Self-pay

## 2018-11-17 ENCOUNTER — Emergency Department (HOSPITAL_COMMUNITY)
Admission: EM | Admit: 2018-11-17 | Discharge: 2018-11-18 | Disposition: A | Discharge status: Home / Self Care | Payer: Self-pay | Attending: Emergency Medicine | Admitting: Emergency Medicine

## 2018-11-17 ENCOUNTER — Encounter (HOSPITAL_COMMUNITY): Payer: Self-pay | Admitting: Emergency Medicine

## 2018-11-17 DIAGNOSIS — F17210 Nicotine dependence, cigarettes, uncomplicated: Secondary | ICD-10-CM | POA: Insufficient documentation

## 2018-11-17 DIAGNOSIS — Z79899 Other long term (current) drug therapy: Secondary | ICD-10-CM | POA: Insufficient documentation

## 2018-11-17 DIAGNOSIS — R1031 Right lower quadrant pain: Secondary | ICD-10-CM | POA: Insufficient documentation

## 2018-11-17 DIAGNOSIS — R112 Nausea with vomiting, unspecified: Secondary | ICD-10-CM | POA: Insufficient documentation

## 2018-11-17 DIAGNOSIS — R197 Diarrhea, unspecified: Secondary | ICD-10-CM | POA: Insufficient documentation

## 2018-11-17 LAB — CBC
HCT: 36.6 % (ref 36.0–46.0)
Hemoglobin: 11.9 g/dL — ABNORMAL LOW (ref 12.0–15.0)
MCH: 31.8 pg (ref 26.0–34.0)
MCHC: 32.5 g/dL (ref 30.0–36.0)
MCV: 97.9 fL (ref 80.0–100.0)
Platelets: 315 K/uL (ref 150–400)
RBC: 3.74 MIL/uL — ABNORMAL LOW (ref 3.87–5.11)
RDW: 12.3 % (ref 11.5–15.5)
WBC: 9.4 K/uL (ref 4.0–10.5)
nRBC: 0 % (ref 0.0–0.2)

## 2018-11-17 LAB — URINALYSIS, ROUTINE W REFLEX MICROSCOPIC
Bilirubin Urine: NEGATIVE
Glucose, UA: NEGATIVE mg/dL
Hgb urine dipstick: NEGATIVE
Ketones, ur: NEGATIVE mg/dL
Leukocytes,Ua: NEGATIVE
Nitrite: NEGATIVE
Protein, ur: NEGATIVE mg/dL
Specific Gravity, Urine: 1.003 — ABNORMAL LOW (ref 1.005–1.030)
pH: 7 (ref 5.0–8.0)

## 2018-11-17 LAB — COMPREHENSIVE METABOLIC PANEL WITH GFR
ALT: 23 U/L (ref 0–44)
AST: 39 U/L (ref 15–41)
Albumin: 4.3 g/dL (ref 3.5–5.0)
Alkaline Phosphatase: 72 U/L (ref 38–126)
Anion gap: 8 (ref 5–15)
BUN: 11 mg/dL (ref 6–20)
CO2: 23 mmol/L (ref 22–32)
Calcium: 8.9 mg/dL (ref 8.9–10.3)
Chloride: 106 mmol/L (ref 98–111)
Creatinine, Ser: 0.8 mg/dL (ref 0.44–1.00)
GFR calc Af Amer: >60 mL/min (ref >60)
GFR calc non Af Amer: >60 mL/min (ref >60)
Glucose, Bld: 95 mg/dL (ref 70–99)
Potassium: 5.4 mmol/L — ABNORMAL HIGH (ref 3.5–5.1)
Sodium: 137 mmol/L (ref 135–145)
Total Bilirubin: 1.3 mg/dL — ABNORMAL HIGH (ref 0.3–1.2)
Total Protein: 7.5 g/dL (ref 6.5–8.1)

## 2018-11-17 LAB — LIPASE, BLOOD: Lipase: 40 U/L (ref 11–51)

## 2018-11-17 LAB — I-STAT BETA HCG BLOOD, ED (MC, WL, AP ONLY): I-stat hCG, quantitative: <5 m[IU]/mL (ref <5)

## 2018-11-17 MED ORDER — HYDROMORPHONE HCL 1 MG/ML IJ SOLN
1.0000 mg | Freq: Once | INTRAMUSCULAR | Status: AC
  Administered 2018-11-17: 1 mg via INTRAVENOUS
  Filled 2018-11-17: qty 1

## 2018-11-17 MED ORDER — MORPHINE SULFATE (PF) 4 MG/ML IV SOLN
4.0000 mg | Freq: Once | INTRAVENOUS | Status: AC
  Administered 2018-11-17: 4 mg via INTRAVENOUS
  Filled 2018-11-17: qty 1

## 2018-11-17 MED ORDER — ONDANSETRON HCL 4 MG/2ML IJ SOLN
4.0000 mg | Freq: Once | INTRAMUSCULAR | Status: AC
  Administered 2018-11-17: 4 mg via INTRAVENOUS
  Filled 2018-11-17: qty 2

## 2018-11-17 MED ORDER — SODIUM CHLORIDE 0.9% FLUSH
3.0000 mL | Freq: Once | INTRAVENOUS | Status: AC
  Administered 2018-11-17 – 2018-11-18 (×2): 3 mL via INTRAVENOUS

## 2018-11-17 NOTE — ED Provider Notes (Signed)
[facility]-EMERGENCY DEPT Provider Note   CSN: [id number] Arrival date & time: 11/17/18  2016    History   Chief Complaint Chief Complaint  Patient presents with  . Abdominal Pain    HPI Tonya Davidson is a 28 y.o. female.     The history is provided by the patient and medical records.  Abdominal Pain  Associated symptoms: diarrhea, nausea and vomiting      28 year old female with history of anemia, anxiety, bipolar disorder, chronic pain, depression, fibromyalgia, interstitial cystitis, presenting to the ED with right lower quadrant abdominal pain.  Patient reports pain began yesterday around her navel and has since transition to right side of her abdomen.  States pain has been worsening since onset.  She reports several episodes of nausea, vomiting, and diarrhea.  States she has been trying to eat and drink, however appetite is very poor.  She has not noticed any fevers.  No urinary symptoms, pelvic pain, or vaginal discharge.  She has been taking Tylenol and applying heat to her abdomen without much change.  Prior abdominal surgeries include cholecystectomy, kidney stone extraction, and several GYN biopsies.  Past Medical History:  Diagnosis Date  . Abnormal Pap smear    colposcopy  . Anemia   . Anxiety   . Arthritis   . Bipolar 1 disorder (HCC)   . C. difficile diarrhea   . Chlamydia 04/29/2012  . Chronic pain   . Depression   . Fibromyalgia   . HPV in female   . IBS (irritable bowel syndrome)   . Interstitial cystitis   . Kidney stones   . Mononucleosis   . Pelvic pain in female 12/14/2014  . PID (acute pelvic inflammatory disease) 04/28/2012  . Ulcer     Patient Active Problem List   Diagnosis Date Noted  . Polysubstance abuse (HCC) 10/13/2018  . 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 Pyelonephritis 09/15/2016  . Right flank pain   . Pelvic pain in female 12/14/2014  . Severe episode  of recurrent major depressive disorder (HCC) 10/03/2013  . GAD (generalized anxiety disorder) 09/30/2013  . PID (acute pelvic inflammatory disease) 04/28/2012  . ANXIETY 05/18/2009  . IBS 05/18/2009    Past Surgical History:  Procedure Laterality Date  . COLPOSCOPY    . INNER EAR SURGERY    . KIDNEY STONE SURGERY    . LAPAROSCOPIC CHOLECYSTECTOMY    . TONSILLECTOMY    . WISDOM TOOTH EXTRACTION       OB History    Gravida  0   Para      Term      Preterm      AB      Living        SAB      TAB      Ectopic      Multiple      Live Births               Home Medications    Prior to Admission medications   Medication Sig Start Date End Date Taking? Authorizing Provider  acetaminophen (TYLENOL) 325 MG tablet Take 2 tablets (650 mg total) by mouth every 6 (six) hours as needed for mild pain or moderate pain. (May buy over the counter) 10/15/18  Yes Aldean Baker, NP  clonazePAM (KLONOPIN) 0.5 MG tablet Take 1 tablet (0.5 mg total) by mouth 2 (two) times daily as needed (anxiety or insomnia). 10/15/18  Yes Aldean Baker, NP  drospirenone-ethinyl estradiol (YAZ,GIANVI,LORYNA) 3-0.02 MG tablet Take 1 tablet by mouth daily.   Yes [provider]  hydrOXYzine (ATARAX/VISTARIL) 25 MG tablet Take 1 tablet (25 mg total) by mouth every 6 (six) hours as needed (anxiety/agitation or CIWA < or = 10). 10/15/18  Yes Aldean Baker, NP  mirtazapine (REMERON) 15 MG tablet Take 1 tablet (15 mg total) by mouth at bedtime. For mood 10/15/18  Yes Aldean Baker, NP  sertraline (ZOLOFT) 25 MG tablet Take 0.5 tablets (12.5 mg total) by mouth daily. For mood Patient taking differently: Take 25 mg by mouth daily. For mood 10/15/18  Yes Aldean Baker, NP  ARIPiprazole (ABILIFY) 2 MG tablet Take 0.5 tablets (1 mg total) by mouth daily. For mood Patient not taking: Reported on 11/17/2018 10/15/18   Aldean Baker, NP  benzocaine (ORAJEL) 10 % mucosal gel Use as directed in the mouth or  throat 4 (four) times daily as needed for mouth pain. (May buy over the counter) Patient not taking: Reported on 11/17/2018 10/15/18   Aldean Baker, NP    Family History Family History  Problem Relation Age of Onset  . Bipolar disorder Mother   . Cancer Father   . ADD / ADHD Brother   . Cancer Maternal Grandmother   . Cancer Maternal Grandfather   . Cancer Paternal Grandmother   . Cancer Paternal Grandfather     Social History Social History   Tobacco Use  . Smoking status: Current Every Day Smoker    Packs/day: 0.50    Years: 5.00    Pack years: 2.50    Types: Cigarettes  . Smokeless tobacco: Never Used  Substance Use Topics  . Alcohol use: No  . Drug use: Yes    Types: Marijuana    Comment: quit in June 2015 per patient      Allergies   Azithromycin; Doxycycline; Prednisone; Bactrim [sulfamethoxazole-trimethoprim]; Flagyl [metronidazole]; Naproxen; Toradol [ketorolac tromethamine]; and Morphine and related   Review of Systems Review of Systems  Gastrointestinal: Positive for abdominal pain, diarrhea, nausea and vomiting.  All other systems reviewed and are negative.    Physical Exam Updated Vital Signs BP 113/78 (BP Location: Right Arm)   Pulse (!) 104   Temp 98.4 F (36.9 C)   Resp 18   LMP 11/10/2018 (Approximate)   SpO2 97%   Physical Exam Vitals signs and nursing note reviewed.  Constitutional:      Appearance: She is well-developed.  HENT:     Head: Normocephalic and atraumatic.  Eyes:     Conjunctiva/sclera: Conjunctivae normal.     Pupils: Pupils are equal, round, and reactive to light.  Neck:     Musculoskeletal: Normal range of motion.  Cardiovascular:     Rate and Rhythm: Normal rate and regular rhythm.     Heart sounds: Normal heart sounds.  Pulmonary:     Effort: Pulmonary effort is normal.     Breath sounds: Normal breath sounds.  Abdominal:     General: Bowel sounds are normal.     Palpations: Abdomen is soft.     Tenderness:  There is abdominal tenderness in the right lower quadrant and periumbilical area.    Musculoskeletal: Normal range of motion.  Skin:    General: Skin is warm and dry.  Neurological:     Mental Status: She is alert and oriented to person, place, and time.      ED Treatments / Results  Labs (all labs ordered are listed, but only abnormal results are displayed) Labs Reviewed  COMPREHENSIVE METABOLIC PANEL - Abnormal; Notable for the following components:      Result Value   Potassium 5.4 (*)    Total Bilirubin 1.3 (*)    All other components within normal limits  CBC - Abnormal; Notable for the following components:   RBC 3.74 (*)    Hemoglobin 11.9 (*)    All other components within normal limits  URINALYSIS, ROUTINE W REFLEX MICROSCOPIC - Abnormal; Notable for the following components:   Color, Urine STRAW (*)    Specific Gravity, Urine 1.003 (*)    All other components within normal limits  LIPASE, BLOOD  I-STAT BETA HCG BLOOD, ED ([facility], [facility], [facility] ONLY)    EKG None  Radiology Ct Abdomen Pelvis W Contrast  Result Date: 11/18/2018 CLINICAL DATA:  Initial evaluation for acute right lower quadrant abdominal pain. EXAM: CT ABDOMEN AND PELVIS WITH CONTRAST TECHNIQUE: Multidetector CT imaging of the abdomen and pelvis was performed using the standard protocol following bolus administration of intravenous contrast. CONTRAST:  ISOVUE-300 IOPAMIDOL (ISOVUE-300) INJECTION 61% COMPARISON:  Prior CT from 09/27/2018. FINDINGS: Lower chest: Scattered subsegmental atelectatic changes in air trapping present at the lung bases. Visualized lungs are otherwise clear. Hepatobiliary: Liver within normal limits. Gallbladder surgically absent. Mild intrahepatic biliary dilatation like related post cholecystectomy changes. Pancreas: Pancreas within normal limits. Spleen: Spleen within normal limits. Adrenals/Urinary Tract: Adrenal glands are normal. Kidneys equal in size with symmetric enhancement. No  nephrolithiasis, hydronephrosis, or focal enhancing renal mass. No appreciable hydroureter. Bladder within normal limits. Stomach/Bowel: Stomach within normal limits. No evidence for bowel obstruction. Appendix within normal limits without evidence for acute appendicitis. No abnormal wall thickening, mucosal enhancement, or inflammatory fat stranding seen about the bowels. Vascular/Lymphatic: Normal intravascular enhancement seen throughout the intra-abdominal aorta. Mesenteric vessels patent proximally. No adenopathy. Reproductive: Uterus and ovaries within normal limits for age. Other: No free air or fluid. Musculoskeletal: No acute osseous finding. No discrete lytic or blastic osseous lesions. IMPRESSION: 1. No CT evidence for acute intra-abdominal or pelvic process. 2. Normal appendix. 3. Status post cholecystectomy. Electronically Signed   By: Rise Mu M.D.   On: 11/18/2018 01:14    Procedures Procedures (including critical care time)  Medications Ordered in ED Medications  dicyclomine (BENTYL) capsule 20 mg (0 mg Oral Not Given 11/18/18 0159)  acetaminophen (TYLENOL) tablet 1,000 mg (0 mg Oral Not Given 11/18/18 0158)  sodium chloride flush (NS) 0.9 % injection 3 mL (3 mLs Intravenous Given by Other 11/18/18 0031)  morphine 4 MG/ML injection 4 mg (4 mg Intravenous Given 11/17/18 2318)  ondansetron (ZOFRAN) injection 4 mg (4 mg Intravenous Given 11/17/18 2318)  HYDROmorphone (DILAUDID) injection 1 mg (1 mg Intravenous Given 11/17/18 2354)  iopamidol (ISOVUE-300) 61 % injection 100 mL (100 mLs Intravenous Contrast Given 11/18/18 0032)     Initial Impression / Assessment and Plan / ED Course  I have reviewed the triage vital signs and the nursing notes.  Pertinent labs & imaging results that were available during my care of the patient were reviewed by me and considered in my medical decision making (see chart for details).  28 y.o. F here with N/V/D and RLQ pain since yesterday.  No urinary  symptoms or vaginal complaints.  She is afebrile, non-toxic.  Abdomen is soft but tender periumbilically and in the right lower quadrant.  Labs were obtained and are overall reassuring.  CT scan without  any acute findings.  Patient treated here with fluids and medications.  She has not had any active emesis or diarrhea here.  This may represent viral process.  Plan to discharge home with symptomatic care.  Follow-up with PCP.  Return here for any new or acute changes.  Final Clinical Impressions(s) / ED Diagnoses   Final diagnoses:  Right lower quadrant abdominal pain  Nausea vomiting and diarrhea    ED Discharge Orders         Ordered    ondansetron (ZOFRAN ODT) 4 MG disintegrating tablet  Every 8 hours PRN     11/18/18 0206    dicyclomine (BENTYL) 20 MG tablet  2 times daily     11/18/18 0206           Garlon Hatchet, PA-C 11/18/18 0229    Lorre Nick, MD 11/18/18 [phone]

## 2018-11-17 NOTE — ED Triage Notes (Signed)
Patient c/o RLQ pain with N/V/D since yesterday. Denies urinary sx.

## 2018-11-17 NOTE — ED Notes (Signed)
Patient refusing blood draw in triage. Requesting blood draw with IV start.

## 2018-11-18 ENCOUNTER — Encounter (HOSPITAL_COMMUNITY): Payer: Self-pay

## 2018-11-18 ENCOUNTER — Emergency Department (HOSPITAL_COMMUNITY): Payer: Self-pay

## 2018-11-18 MED ORDER — ACETAMINOPHEN 500 MG PO TABS
1000.0000 mg | ORAL_TABLET | Freq: Once | ORAL | Status: DC
  Filled 2018-11-18: qty 2

## 2018-11-18 MED ORDER — IOPAMIDOL (ISOVUE-300) INJECTION 61%
INTRAVENOUS | Status: AC
  Administered 2018-11-18: 100 mL via INTRAVENOUS
  Filled 2018-11-18: qty 100

## 2018-11-18 MED ORDER — DICYCLOMINE HCL 10 MG PO CAPS
20.0000 mg | ORAL_CAPSULE | Freq: Once | ORAL | Status: DC
  Filled 2018-11-18: qty 2

## 2018-11-18 MED ORDER — SODIUM CHLORIDE (PF) 0.9 % IJ SOLN
INTRAMUSCULAR | Status: AC
  Administered 2018-11-18: 3 mL via INTRAVENOUS
  Filled 2018-11-18: qty 50

## 2018-11-18 MED ORDER — IOPAMIDOL (ISOVUE-300) INJECTION 61%
100.0000 mL | Freq: Once | INTRAVENOUS | Status: AC | PRN
  Administered 2018-11-18: 100 mL via INTRAVENOUS

## 2018-11-18 MED ORDER — ONDANSETRON 4 MG PO TBDP: 4.0000 mg | ORAL_TABLET | Freq: Three times a day (TID) | ORAL | 0 refills | Status: DC | PRN

## 2018-11-18 MED ORDER — DICYCLOMINE HCL 20 MG PO TABS: 20.0000 mg | ORAL_TABLET | Freq: Two times a day (BID) | ORAL | 0 refills | Status: AC

## 2018-11-18 NOTE — Discharge Instructions (Signed)
Your CT today was normal. Can use the medication to help with symptoms. Follow-up with your primary care doctor. Return here for any new/acute changes.

## 2019-01-17 DIAGNOSIS — N83209 Unspecified ovarian cyst, unspecified side: Secondary | ICD-10-CM

## 2019-01-17 HISTORY — DX: Unspecified ovarian cyst, unspecified side: N83.209

## 2019-02-12 ENCOUNTER — Inpatient Hospital Stay (HOSPITAL_COMMUNITY)
Admission: AD | Admit: 2019-02-12 | Discharge: 2019-02-13 | Disposition: A | Discharge status: Home / Self Care | Payer: Medicaid Other | Attending: Family Medicine | Admitting: Family Medicine

## 2019-02-12 ENCOUNTER — Encounter (HOSPITAL_COMMUNITY): Payer: Self-pay

## 2019-02-12 ENCOUNTER — Other Ambulatory Visit: Payer: Self-pay

## 2019-02-12 DIAGNOSIS — Z885 Allergy status to narcotic agent status: Secondary | ICD-10-CM | POA: Diagnosis not present

## 2019-02-12 DIAGNOSIS — F17210 Nicotine dependence, cigarettes, uncomplicated: Secondary | ICD-10-CM | POA: Diagnosis not present

## 2019-02-12 DIAGNOSIS — Z881 Allergy status to other antibiotic agents status: Secondary | ICD-10-CM | POA: Diagnosis not present

## 2019-02-12 DIAGNOSIS — O26891 Other specified pregnancy related conditions, first trimester: Secondary | ICD-10-CM | POA: Diagnosis not present

## 2019-02-12 DIAGNOSIS — O99341 Other mental disorders complicating pregnancy, first trimester: Secondary | ICD-10-CM | POA: Diagnosis not present

## 2019-02-12 DIAGNOSIS — Z888 Allergy status to other drugs, medicaments and biological substances status: Secondary | ICD-10-CM | POA: Insufficient documentation

## 2019-02-12 DIAGNOSIS — Z1159 Encounter for screening for other viral diseases: Secondary | ICD-10-CM | POA: Diagnosis not present

## 2019-02-12 DIAGNOSIS — F419 Anxiety disorder, unspecified: Secondary | ICD-10-CM | POA: Insufficient documentation

## 2019-02-12 DIAGNOSIS — O26899 Other specified pregnancy related conditions, unspecified trimester: Secondary | ICD-10-CM

## 2019-02-12 DIAGNOSIS — R17 Unspecified jaundice: Secondary | ICD-10-CM | POA: Diagnosis present

## 2019-02-12 DIAGNOSIS — M797 Fibromyalgia: Secondary | ICD-10-CM | POA: Diagnosis not present

## 2019-02-12 DIAGNOSIS — M1990 Unspecified osteoarthritis, unspecified site: Secondary | ICD-10-CM | POA: Diagnosis not present

## 2019-02-12 DIAGNOSIS — Z79899 Other long term (current) drug therapy: Secondary | ICD-10-CM | POA: Insufficient documentation

## 2019-02-12 DIAGNOSIS — O9989 Other specified diseases and conditions complicating pregnancy, childbirth and the puerperium: Secondary | ICD-10-CM | POA: Diagnosis not present

## 2019-02-12 DIAGNOSIS — R197 Diarrhea, unspecified: Secondary | ICD-10-CM | POA: Diagnosis not present

## 2019-02-12 DIAGNOSIS — F319 Bipolar disorder, unspecified: Secondary | ICD-10-CM | POA: Diagnosis not present

## 2019-02-12 DIAGNOSIS — O99331 Smoking (tobacco) complicating pregnancy, first trimester: Secondary | ICD-10-CM | POA: Insufficient documentation

## 2019-02-12 DIAGNOSIS — F1910 Other psychoactive substance abuse, uncomplicated: Secondary | ICD-10-CM

## 2019-02-12 DIAGNOSIS — Z9049 Acquired absence of other specified parts of digestive tract: Secondary | ICD-10-CM | POA: Diagnosis not present

## 2019-02-12 DIAGNOSIS — Z3A10 10 weeks gestation of pregnancy: Secondary | ICD-10-CM | POA: Diagnosis not present

## 2019-02-12 DIAGNOSIS — R1031 Right lower quadrant pain: Secondary | ICD-10-CM | POA: Diagnosis present

## 2019-02-12 DIAGNOSIS — R102 Pelvic and perineal pain: Secondary | ICD-10-CM | POA: Diagnosis not present

## 2019-02-12 LAB — COMPREHENSIVE METABOLIC PANEL
ALT: 17 U/L (ref 0–44)
AST: 16 U/L (ref 15–41)
Albumin: 4.5 g/dL (ref 3.5–5.0)
Alkaline Phosphatase: 70 U/L (ref 38–126)
Anion gap: 13 (ref 5–15)
BUN: 7 mg/dL (ref 6–20)
CO2: 18 mmol/L — ABNORMAL LOW (ref 22–32)
Calcium: 9.2 mg/dL (ref 8.9–10.3)
Chloride: 105 mmol/L (ref 98–111)
Creatinine, Ser: 0.72 mg/dL (ref 0.44–1.00)
GFR calc Af Amer: >60 mL/min (ref >60)
GFR calc non Af Amer: >60 mL/min (ref >60)
Glucose, Bld: 82 mg/dL (ref 70–99)
Potassium: 3.2 mmol/L — ABNORMAL LOW (ref 3.5–5.1)
Sodium: 136 mmol/L (ref 135–145)
Total Bilirubin: 1.5 mg/dL — ABNORMAL HIGH (ref 0.3–1.2)
Total Protein: 7.7 g/dL (ref 6.5–8.1)

## 2019-02-12 LAB — CBC
HCT: 37.2 % (ref 36.0–46.0)
Hemoglobin: 12.8 g/dL (ref 12.0–15.0)
MCH: 30.8 pg (ref 26.0–34.0)
MCHC: 34.4 g/dL (ref 30.0–36.0)
MCV: 89.4 fL (ref 80.0–100.0)
Platelets: 258 10*3/uL (ref 150–400)
RBC: 4.16 MIL/uL (ref 3.87–5.11)
RDW: 11.5 % (ref 11.5–15.5)
WBC: 7.1 10*3/uL (ref 4.0–10.5)
nRBC: 0 % (ref 0.0–0.2)

## 2019-02-12 LAB — WET PREP, GENITAL
Clue Cells Wet Prep HPF POC: NONE SEEN
Sperm: NONE SEEN
Trich, Wet Prep: NONE SEEN
Yeast Wet Prep HPF POC: NONE SEEN

## 2019-02-12 LAB — DIFFERENTIAL
Basophils Absolute: 0.1 10*3/uL (ref 0.0–0.1)
Basophils Relative: 1 %
Eosinophils Absolute: 0.1 10*3/uL (ref 0.0–0.5)
Eosinophils Relative: 2 %
Lymphocytes Relative: 37 %
Lymphs Abs: 2.7 10*3/uL (ref 0.7–4.0)
Monocytes Absolute: 0.5 10*3/uL (ref 0.1–1.0)
Monocytes Relative: 6 %
Neutro Abs: 3.8 10*3/uL (ref 1.7–7.7)
Neutrophils Relative %: 54 %

## 2019-02-12 LAB — SARS CORONAVIRUS 2 BY RT PCR (HOSPITAL ORDER, PERFORMED IN ~~LOC~~ HOSPITAL LAB): SARS Coronavirus 2: NEGATIVE

## 2019-02-12 LAB — HCG, QUANTITATIVE, PREGNANCY: hCG, Beta Chain, Quant, S: 5474 m[IU]/mL — ABNORMAL HIGH (ref <5)

## 2019-02-12 MED ORDER — HYDROMORPHONE HCL 1 MG/ML IJ SOLN: 0.5000 mg | Freq: Once | INTRAMUSCULAR | Status: DC

## 2019-02-12 MED ORDER — SODIUM CHLORIDE 0.9 % IV SOLN
INTRAVENOUS | Status: DC
  Administered 2019-02-12: 23:00:00 via INTRAVENOUS

## 2019-02-12 MED ORDER — HYDROMORPHONE HCL 1 MG/ML IJ SOLN
0.2500 mg | Freq: Once | INTRAMUSCULAR | Status: AC
  Administered 2019-02-12: 0.25 mg via INTRAVENOUS
  Filled 2019-02-12: qty 1

## 2019-02-12 NOTE — MAU Note (Signed)
States she has been having chills for the past few weeks-had a fever of 103 about a week and a half ago.  States she hasn't seen anyone for her fevers.  No SOB/dry cough reported.  Found out she was pregnant about 3 days ago.  Started having lower abdominal pain in the middle and right side about the same time-states she's unable to sleep.  Some spotting for the past 3 days also.

## 2019-02-12 NOTE — MAU Provider Note (Addendum)
Chief Complaint: Abdominal Pain   First Provider Initiated Contact with Patient 02/12/19 2210        SUBJECTIVE HPI: Tonya Davidson is a 28 y.o. G1P0 at [redacted]w[redacted]d by LMP who presents to maternity admissions reporting severe right lower quadrant abdominal pain.  States has been having this pain all week but got worse tonight.  Told RN it started tonight.  Reports spotting to RN.  Marland Kitchen She denies vaginal itching/burning, urinary symptoms, h/a, dizziness, n/v   Reports fever for the past 3 days, mostly 101-103 degrees.  States has had a cough since March, thinks it is due to allergies.    States has not been able to eat for 1-2 weeks, no nausea, "just couldn't eat".   Also reports having Diarrhea for 2-3 days, attributes it to the vitamin she takes.  States it is green.   States has not been seen for this pregnancy due to not having insurance.  States only found out she was pregnant 3 days ago.    Review of Epic records shows multiple ER visits for various types of pain with belligerent behavior when not given the narcotics she wanted.  Review of Care Everywhere showed she has been seen within the last 2-3 days for this same RLQ pain and had a full workup including pelvic/cultures, A CT scan in April and 2 ultrasounds and an MRI 2 days ago.   She had initially denied being seen for this pain at all.   Asked RN "how did she know I was seen in [facility]?".    RN Note: States she has been having chills for the past few weeks-had a fever of 103 about a week and a half ago.  States she hasn't seen anyone for her fevers.  No SOB/dry cough reported.  Found out she was pregnant about 3 days ago.  Started having lower abdominal pain in the middle and right side about the same time-states she's unable to sleep.  Some spotting for the past 3 days also.    Past Medical History:  Diagnosis Date  . Abnormal Pap smear    colposcopy  . Anemia   . Anxiety   . Arthritis   . Bipolar 1 disorder (HCC)   . C. difficile  diarrhea   . Chlamydia 04/29/2012  . Chronic pain   . Depression   . Fibromyalgia   . HPV in female   . IBS (irritable bowel syndrome)   . Interstitial cystitis   . Kidney stones   . Mononucleosis   . Pelvic pain in female 12/14/2014  . PID (acute pelvic inflammatory disease) 04/28/2012  . Ulcer    Past Surgical History:  Procedure Laterality Date  . COLPOSCOPY    . INNER EAR SURGERY    . KIDNEY STONE SURGERY    . LAPAROSCOPIC CHOLECYSTECTOMY    . TONSILLECTOMY    . WISDOM TOOTH EXTRACTION     Social History   Socioeconomic History  . Marital status: Single    Spouse name: Not on file  . Number of children: Not on file  . Years of education: Not on file  . Highest education level: Not on file  Occupational History  . Not on file  Social Needs  . Financial resource strain: Not on file  . Food insecurity:    Worry: Not on file    Inability: Not on file  . Transportation needs:    Medical: Not on file    Non-medical: Not on file  Tobacco Use  . Smoking status: Current Every Day Smoker    Packs/day: 0.50    Years: 5.00    Pack years: 2.50    Types: Cigarettes  . Smokeless tobacco: Never Used  Substance and Sexual Activity  . Alcohol use: No  . Drug use: Yes    Types: Marijuana    Comment: quit in June 2015 per patient   . Sexual activity: Not Currently    Birth control/protection: None, Pill  Lifestyle  . Physical activity:    Days per week: Not on file    Minutes per session: Not on file  . Stress: Not on file  Relationships  . Social connections:    Talks on phone: Not on file    Gets together: Not on file    Attends religious service: Not on file    Active member of club or organization: Not on file    Attends meetings of clubs or organizations: Not on file    Relationship status: Not on file  . Intimate partner violence:    Fear of current or ex partner: Not on file    Emotionally abused: Not on file    Physically abused: Not on file    Forced  sexual activity: Not on file  Other Topics Concern  . Not on file  Social History Narrative   ** Merged History Encounter **       No current facility-administered medications on file prior to encounter.    Current Outpatient Medications on File Prior to Encounter  Medication Sig Dispense Refill  . acetaminophen (TYLENOL) 325 MG tablet Take 2 tablets (650 mg total) by mouth every 6 (six) hours as needed for mild pain or moderate pain. (May buy over the counter) 1 tablet 0  . ARIPiprazole (ABILIFY) 2 MG tablet Take 0.5 tablets (1 mg total) by mouth daily. For mood (Patient not taking: Reported on 11/17/2018) 15 tablet 0  . benzocaine (ORAJEL) 10 % mucosal gel Use as directed in the mouth or throat 4 (four) times daily as needed for mouth pain. (May buy over the counter) (Patient not taking: Reported on 11/17/2018) 5.3 g 0  . clonazePAM (KLONOPIN) 0.5 MG tablet Take 1 tablet (0.5 mg total) by mouth 2 (two) times daily as needed (anxiety or insomnia). 10 tablet 0  . dicyclomine (BENTYL) 20 MG tablet Take 1 tablet (20 mg total) by mouth 2 (two) times daily. 20 tablet 0  . drospirenone-ethinyl estradiol (YAZ,GIANVI,LORYNA) 3-0.02 MG tablet Take 1 tablet by mouth daily.    . hydrOXYzine (ATARAX/VISTARIL) 25 MG tablet Take 1 tablet (25 mg total) by mouth every 6 (six) hours as needed (anxiety/agitation or CIWA < or = 10). 30 tablet 0  . mirtazapine (REMERON) 15 MG tablet Take 1 tablet (15 mg total) by mouth at bedtime. For mood 30 tablet 0  . ondansetron (ZOFRAN ODT) 4 MG disintegrating tablet Take 1 tablet (4 mg total) by mouth every 8 (eight) hours as needed for nausea. 10 tablet 0  . sertraline (ZOLOFT) 25 MG tablet Take 0.5 tablets (12.5 mg total) by mouth daily. For mood (Patient taking differently: Take 25 mg by mouth daily. For mood) 15 tablet 0   Allergies  Allergen Reactions  . Azithromycin Other (See Comments)    GI upset and abdominal pain  . Doxycycline Nausea And Vomiting    Pt states  she gets violently sick   . Prednisone Other (See Comments)    Impacts Bipolar symptoms   .  Bactrim [Sulfamethoxazole-Trimethoprim] Swelling  . Flagyl [Metronidazole] Nausea And Vomiting  . Toradol [Ketorolac Tromethamine] Other (See Comments)    Makes arms tingly  . Morphine And Related Palpitations    Minor hives - pt is okay to take, hasn't needed benadryl in past    I have reviewed patient's Past Medical Hx, Surgical Hx, Family Hx, Social Hx, medications and allergies.   ROS:  Review of Systems  Constitutional: Positive for appetite change, chills and fever.  Eyes: Negative for visual disturbance.  Respiratory: Negative for cough and shortness of breath.   Gastrointestinal: Positive for abdominal pain. Negative for constipation, diarrhea and nausea.  Genitourinary: Positive for pelvic pain and vaginal pain. Negative for dysuria, vaginal bleeding and vaginal discharge.  Neurological: Negative for dizziness and weakness.   Review of Systems  Other systems negative   Physical Exam  Patient Vitals for the past 24 hrs:  BP Temp Temp src Pulse Resp SpO2 Weight  02/12/19 2143 106/63 - - 98 20 100 % 57.2 kg  02/12/19 2023 - 98.6 F (37 C) Oral - - - -   Constitutional: Well-developed, well-nourished female.  Rocking back and forth, crying.  Cardiovascular: normal rate Respiratory: normal effort GI: Abd soft, tender over lower abdomen.  States more on RLQ.  Slight guarding.Marland Kitchen Pos BS x 4 MS: Extremities nontender, no edema, normal ROM Neurologic: Alert and oriented x 4.  GU: Neg CVAT.  PELVIC EXAM: Cervix pink, visually closed, without lesion, scant white creamy discharge, vaginal walls and external genitalia normal Bimanual exam: Cervix 0/long/high, firm, anterior, neg CMT, uterus nontender, enlarged to 6 week size., adnexa without tenderness, enlargement, or mass   LAB RESULTS Results for orders placed or performed during the hospital encounter of 02/12/19 (from the past 24  hour(s))  SARS Coronavirus 2 (CEPHEID- Performed in [facility] hospital lab), Hosp Order     Status: None   Collection Time: 02/12/19  9:44 PM  Result Value Ref Range   SARS Coronavirus 2 NEGATIVE NEGATIVE  CBC     Status: None   Collection Time: 02/12/19 10:00 PM  Result Value Ref Range   WBC 7.1 4.0 - 10.5 K/uL   RBC 4.16 3.87 - 5.11 MIL/uL   Hemoglobin 12.8 12.0 - 15.0 g/dL   HCT 16.1 09.6 - 04.5 %   MCV 89.4 80.0 - 100.0 fL   MCH 30.8 26.0 - 34.0 pg   MCHC 34.4 30.0 - 36.0 g/dL   RDW 40.9 81.1 - 91.4 %   Platelets 258 150 - 400 K/uL   nRBC 0.0 0.0 - 0.2 %  Comprehensive metabolic panel     Status: Abnormal   Collection Time: 02/12/19 10:00 PM  Result Value Ref Range   Sodium 136 135 - 145 mmol/L   Potassium 3.2 (L) 3.5 - 5.1 mmol/L   Chloride 105 98 - 111 mmol/L   CO2 18 (L) 22 - 32 mmol/L   Glucose, Bld 82 70 - 99 mg/dL   BUN 7 6 - 20 mg/dL   Creatinine, Ser 7.82 0.44 - 1.00 mg/dL   Calcium 9.2 8.9 - 95.6 mg/dL   Total Protein 7.7 6.5 - 8.1 g/dL   Albumin 4.5 3.5 - 5.0 g/dL   AST 16 15 - 41 U/L   ALT 17 0 - 44 U/L   Alkaline Phosphatase 70 38 - 126 U/L   Total Bilirubin 1.5 (H) 0.3 - 1.2 mg/dL   GFR calc non Af Amer >60 >60 mL/min   GFR calc  Af Amer >60 >60 mL/min   Anion gap 13 5 - 15  hCG, quantitative, pregnancy     Status: Abnormal   Collection Time: 02/12/19 10:00 PM  Result Value Ref Range   hCG, Beta Chain, Quant, S 5,474 (H) <5 mIU/mL  Differential     Status: None   Collection Time: 02/12/19 10:00 PM  Result Value Ref Range   Neutrophils Relative % 54 %   Neutro Abs 3.8 1.7 - 7.7 K/uL   Lymphocytes Relative 37 %   Lymphs Abs 2.7 0.7 - 4.0 K/uL   Monocytes Relative 6 %   Monocytes Absolute 0.5 0.1 - 1.0 K/uL   Eosinophils Relative 2 %   Eosinophils Absolute 0.1 0.0 - 0.5 K/uL   Basophils Relative 1 %   Basophils Absolute 0.1 0.0 - 0.1 K/uL  Wet prep, genital     Status: Abnormal   Collection Time: 02/12/19 10:22 PM  Result Value Ref Range    Yeast Wet Prep HPF POC NONE SEEN NONE SEEN   Trich, Wet Prep NONE SEEN NONE SEEN   Clue Cells Wet Prep HPF POC NONE SEEN NONE SEEN   WBC, Wet Prep HPF POC FEW (A) NONE SEEN   Sperm NONE SEEN   Urine rapid drug screen (hosp performed)     Status: Abnormal   Collection Time: 02/12/19 11:13 PM  Result Value Ref Range   Opiates NONE DETECTED NONE DETECTED   Cocaine NONE DETECTED NONE DETECTED   Benzodiazepines POSITIVE (A) NONE DETECTED   Amphetamines NONE DETECTED NONE DETECTED   Tetrahydrocannabinol POSITIVE (A) NONE DETECTED   Barbiturates NONE DETECTED NONE DETECTED     IMAGING ** Copy/Pasted Imaging Results from Care Everywhere (done in [place] 2 days ago)  US FINDINGS: Intrauterine gestational sac: Single Yolk sac: Visualized. Embryo: Visualized. Cardiac Activity: Visualized. MSD: 1.7 mm estimated gestational age cannot be calculated secondary to small size. Subchorionic hemorrhage: None visualized. Maternal uterus/adnexae: 1.5 cm right ovarian cyst. No other adnexal mass. No pelvic free fluid.  IMPRESSION: Probable early intrauterine gestational sac, but no yolk sac, fetal pole, or cardiac activity yet visualized. Recommend follow-up quantitative B-HCG levels and follow-up US in 14 days to assess viability. This recommendation follows SRU consensus guidelines: Diagnostic Criteria for Nonviable Pregnancy Early in the First Trimester. Malva Limes Med 2013; 161:0960-45.  MRI 02/10/2019 IMPRESSION: The appendix is partially visualized and not dilated. No surrounding inflammatory changes to suggest appendicitis are noted.  Changes consistent with prior cholecystectomy.  Fluid in the endometrial canal consistent with the given clinical history of early pregnancy.  CT April 2020 IMPRESSION:  No evidence of renal calculi or hydronephrosis. No evidence of bowel obstruction free fluid or inflammatory stranding. Wall thickening of the urinary bladder unchanged since  12/12/2018 and correlation for cystitis is suggested. 0.2 cm left lower lobe subpleural pulmonary nodule. By the Fleischner criteria no further workup is recommended for the nonhigh risk patient. And for the high risk patient optional CT scan one year is suggested   MAU Management/MDM: Ordered usual first trimester r/o ectopic labs.   Pelvic exam and cultures done Was planning to check OB ultrasound. Patient denied having any prior US's but results found from [place] and MRI done 2 days ago [facility].  This bleeding/pain can represent a normal pregnancy with bleeding, spontaneous abortion or even an ectopic which can be life-threatening.  The process as listed above helps to determine which of these is present.  Given IV fluids and  0.25mg  Dilaudid prior to planned US (which we ended up not doing) so we could better examine her.  States got some relief then about a half hour later stated pain was back in full.    I reviewed findings of bloodwork done today and imaging from 2 days ago. Reviewed evidence of intrauterine pregnancy effectively excludes ectopic.   MRI and lack of leukocytosis rules out appendicitis.  Discussed pain may be gastrointestinal given history of diarrhea and fever (none while here).  This likely represents either IBS (has history of this) or viral gastroenteritis.  Pt states "so you are just going to leave me in agony with pain like someone is stabbing me?"  Has had no diarrhea while here. Discussed small CLC (1.5cm) may also be contributing to some cramping on right.  Suggested Tylenol and warm baths for pain.  Discussed there is no indication for Rx of narcotics.  Pt states "then I'll just get rid of the stupid thing" (referring to pregnancy).  I suggested she visit her OB/GYN for further referrals and followup.  She had told me that she did have an OB in [place] (evidence in Care Everywhere) but also tells me she has no OB provider  I gave her a list of [place] providers.   Drug  Database showed several Rxes for anxiolytics and narcotics.  A year or two ago she was receiving regular Rxes of 150tabs of Percocet.   ASSESSMENT Single intrauterine pregnancy at 7762w4d by LMP, 5-6 weeks by US (not measured) Pelvic pain Diarrhea  Hx of polysubstance use  PLAN Discharge home Conservative care Suggested she can take two [place] vitamins instead of her PNV she states gives her diarrhea Rx Simethicone for abdominal cramps Followup with OB provider for ongoing care Pt stable at time of discharge. Encouraged to return here or to other Urgent Care/ED if she develops worsening of symptoms, increase in pain, fever, or other concerning symptoms.    Wynelle BourgeoisMarie Shamarion Coots CNM, MSN Certified Nurse-Midwife 02/12/2019  10:10 PM

## 2019-02-13 DIAGNOSIS — R197 Diarrhea, unspecified: Secondary | ICD-10-CM | POA: Diagnosis present

## 2019-02-13 DIAGNOSIS — O26899 Other specified pregnancy related conditions, unspecified trimester: Secondary | ICD-10-CM | POA: Diagnosis present

## 2019-02-13 LAB — RAPID URINE DRUG SCREEN, HOSP PERFORMED
Amphetamines: NOT DETECTED
Barbiturates: NOT DETECTED
Benzodiazepines: POSITIVE — AB
Cocaine: NOT DETECTED
Opiates: NOT DETECTED
Tetrahydrocannabinol: POSITIVE — AB

## 2019-02-13 LAB — GC/CHLAMYDIA PROBE AMP (~~LOC~~) NOT AT ARMC
Chlamydia: NEGATIVE
Neisseria Gonorrhea: NEGATIVE

## 2019-02-13 LAB — HIV ANTIBODY (ROUTINE TESTING W REFLEX): HIV Screen 4th Generation wRfx: NONREACTIVE

## 2019-02-13 MED ORDER — SIMETHICONE 80 MG PO CHEW: 80.0000 mg | CHEWABLE_TABLET | Freq: Four times a day (QID) | ORAL | 0 refills | Status: AC | PRN

## 2019-02-13 NOTE — Discharge Instructions (Signed)
Abdominal Pain During Pregnancy ° °Abdominal pain is common during pregnancy, and has many possible causes. Some causes are more serious than others, and sometimes the cause is not known. Abdominal pain can be a sign that labor is starting. It can also be caused by normal growth and stretching of muscles and ligaments during pregnancy. Always tell your health care provider if you have any abdominal pain. °Follow these instructions at home: °· Do not have sex or put anything in your vagina until your pain goes away completely. °· Get plenty of rest until your pain improves. °· Drink enough fluid to keep your urine pale yellow. °· Take over-the-counter and prescription medicines only as told by your health care provider. °· Keep all follow-up visits as told by your health care provider. This is important. °Contact a health care provider if: °· Your pain continues or gets worse after resting. °· You have lower abdominal pain that: °? Comes and goes at regular intervals. °? Spreads to your back. °? Is similar to menstrual cramps. °· You have pain or burning when you urinate. °Get help right away if: °· You have a fever or chills. °· You have vaginal bleeding. °· You are leaking fluid from your vagina. °· You are passing tissue from your vagina. °· You have vomiting or diarrhea that lasts for more than 24 hours. °· Your baby is moving less than usual. °· You feel very weak or faint. °· You have shortness of breath. °· You develop severe pain in your upper abdomen. °Summary °· Abdominal pain is common during pregnancy, and has many possible causes. °· If you experience abdominal pain during pregnancy, tell your health care provider right away. °· Follow your health care provider's home care instructions and keep all follow-up visits as directed. °This information is not intended to replace advice given to you by your health care provider. Make sure you discuss any questions you have with your health care  provider. °Document Released: 09/04/2005 Document Revised: 12/07/2016 Document Reviewed: 12/07/2016 °Elsevier Interactive Patient Education © 2019 [employer] ° °

## 2019-02-16 ENCOUNTER — Other Ambulatory Visit: Payer: Self-pay

## 2019-02-16 ENCOUNTER — Encounter (HOSPITAL_COMMUNITY): Payer: Self-pay

## 2019-02-16 ENCOUNTER — Inpatient Hospital Stay (HOSPITAL_COMMUNITY): Payer: Medicaid Other

## 2019-02-16 ENCOUNTER — Inpatient Hospital Stay (HOSPITAL_COMMUNITY)
Admission: AD | Admit: 2019-02-16 | Discharge: 2019-02-17 | Disposition: A | Discharge status: Home / Self Care | Payer: Medicaid Other | Attending: Family Medicine | Admitting: Family Medicine

## 2019-02-16 DIAGNOSIS — Z888 Allergy status to other drugs, medicaments and biological substances status: Secondary | ICD-10-CM | POA: Diagnosis not present

## 2019-02-16 DIAGNOSIS — Z881 Allergy status to other antibiotic agents status: Secondary | ICD-10-CM | POA: Diagnosis not present

## 2019-02-16 DIAGNOSIS — Z3A11 11 weeks gestation of pregnancy: Secondary | ICD-10-CM | POA: Diagnosis not present

## 2019-02-16 DIAGNOSIS — Z885 Allergy status to narcotic agent status: Secondary | ICD-10-CM | POA: Insufficient documentation

## 2019-02-16 DIAGNOSIS — R1031 Right lower quadrant pain: Secondary | ICD-10-CM | POA: Diagnosis not present

## 2019-02-16 DIAGNOSIS — Z809 Family history of malignant neoplasm, unspecified: Secondary | ICD-10-CM | POA: Diagnosis not present

## 2019-02-16 DIAGNOSIS — Z818 Family history of other mental and behavioral disorders: Secondary | ICD-10-CM | POA: Diagnosis not present

## 2019-02-16 DIAGNOSIS — Z3490 Encounter for supervision of normal pregnancy, unspecified, unspecified trimester: Secondary | ICD-10-CM

## 2019-02-16 DIAGNOSIS — O99331 Smoking (tobacco) complicating pregnancy, first trimester: Secondary | ICD-10-CM | POA: Diagnosis not present

## 2019-02-16 DIAGNOSIS — F319 Bipolar disorder, unspecified: Secondary | ICD-10-CM | POA: Diagnosis not present

## 2019-02-16 DIAGNOSIS — O209 Hemorrhage in early pregnancy, unspecified: Secondary | ICD-10-CM | POA: Diagnosis not present

## 2019-02-16 DIAGNOSIS — O99341 Other mental disorders complicating pregnancy, first trimester: Secondary | ICD-10-CM | POA: Insufficient documentation

## 2019-02-16 DIAGNOSIS — Z886 Allergy status to analgesic agent status: Secondary | ICD-10-CM | POA: Insufficient documentation

## 2019-02-16 DIAGNOSIS — Z79899 Other long term (current) drug therapy: Secondary | ICD-10-CM | POA: Insufficient documentation

## 2019-02-16 DIAGNOSIS — N83201 Unspecified ovarian cyst, right side: Secondary | ICD-10-CM | POA: Insufficient documentation

## 2019-02-16 DIAGNOSIS — F17210 Nicotine dependence, cigarettes, uncomplicated: Secondary | ICD-10-CM | POA: Diagnosis not present

## 2019-02-16 DIAGNOSIS — O9989 Other specified diseases and conditions complicating pregnancy, childbirth and the puerperium: Secondary | ICD-10-CM | POA: Diagnosis not present

## 2019-02-16 DIAGNOSIS — M797 Fibromyalgia: Secondary | ICD-10-CM | POA: Insufficient documentation

## 2019-02-16 DIAGNOSIS — O3481 Maternal care for other abnormalities of pelvic organs, first trimester: Secondary | ICD-10-CM | POA: Diagnosis not present

## 2019-02-16 DIAGNOSIS — G8929 Other chronic pain: Secondary | ICD-10-CM

## 2019-02-16 LAB — URINALYSIS, ROUTINE W REFLEX MICROSCOPIC
Bilirubin Urine: NEGATIVE
Glucose, UA: NEGATIVE mg/dL
Hgb urine dipstick: NEGATIVE
Ketones, ur: NEGATIVE mg/dL
Leukocytes,Ua: NEGATIVE
Nitrite: NEGATIVE
Protein, ur: NEGATIVE mg/dL
Specific Gravity, Urine: 1.004 — ABNORMAL LOW (ref 1.005–1.030)
pH: 6 (ref 5.0–8.0)

## 2019-02-16 LAB — WET PREP, GENITAL
Clue Cells Wet Prep HPF POC: NONE SEEN
Sperm: NONE SEEN
Trich, Wet Prep: NONE SEEN
Yeast Wet Prep HPF POC: NONE SEEN

## 2019-02-16 MED ORDER — ONDANSETRON 4 MG PO TBDP
4.0000 mg | ORAL_TABLET | Freq: Once | ORAL | Status: AC
  Administered 2019-02-16: 4 mg via ORAL
  Filled 2019-02-16: qty 1

## 2019-02-16 MED ORDER — HYDROMORPHONE HCL 1 MG/ML IJ SOLN
0.5000 mg | Freq: Once | INTRAMUSCULAR | Status: AC
  Administered 2019-02-16: 0.5 mg via INTRAMUSCULAR
  Filled 2019-02-16: qty 1

## 2019-02-16 NOTE — MAU Provider Note (Signed)
History     CSN: [id number]  Arrival date and time: 02/16/19 2024   First Provider Initiated Contact with Patient 02/16/19 2128      Chief Complaint  Patient presents with  . Abdominal Pain   Tonya Davidson is a 28 y.o. G1P0 at [redacted]w[redacted]d who receives care at [facility]. [facility] [place].  She presents today for Abdominal Pain.  She states the pain started yesterday and she has tried lidocaine patches, tylenol, essential oils, and epsom salt without relief.  She rates the pain a 10/10 with no relieving factors, but laying flat makes it worse.  She reports that the pain was "so bad last night I passed out a couple times."  Patient describes the pain as constant stabbing pain that radiates to her back.  She reports taking tylenol 1.5 hrs ago without relief.  Patient also reports vaginal bleeding that was bright red and present upon arrival.  She does not know if she was passing clots, but remarks that the blood saturated two pairs of underwear.  Patient reports nausea.       OB History    Gravida  1   Para      Term      Preterm      AB      Living        SAB      TAB      Ectopic      Multiple      Live Births              Past Medical History:  Diagnosis Date  . Abnormal Pap smear    colposcopy  . Anemia   . Anxiety   . Arthritis   . Bipolar 1 disorder (HCC)   . C. difficile diarrhea   . Chlamydia 04/29/2012  . Chronic pain   . Depression   . Fibromyalgia   . HPV in female   . IBS (irritable bowel syndrome)   . Interstitial cystitis   . Kidney stones   . Mononucleosis   . Ovarian cyst 01/2019   right   . Pelvic pain in female 12/14/2014  . PID (acute pelvic inflammatory disease) 04/28/2012  . Ulcer     Past Surgical History:  Procedure Laterality Date  . COLPOSCOPY    . INNER EAR SURGERY    . KIDNEY STONE SURGERY    . LAPAROSCOPIC CHOLECYSTECTOMY    . TONSILLECTOMY    . WISDOM TOOTH EXTRACTION      Family History  Problem Relation Age of Onset  .  Bipolar disorder Mother   . Cancer Father   . ADD / ADHD Brother   . Cancer Maternal Grandmother   . Cancer Maternal Grandfather   . Cancer Paternal Grandmother   . Cancer Paternal Grandfather     Social History   Tobacco Use  . Smoking status: Current Every Day Smoker    Packs/day: 0.50    Years: 5.00    Pack years: 2.50    Types: Cigarettes  . Smokeless tobacco: Never Used  Substance Use Topics  . Alcohol use: No  . Drug use: Yes    Types: Marijuana    Comment: last used 3wks ago     Allergies:  Allergies  Allergen Reactions  . Azithromycin Other (See Comments)    GI upset and abdominal pain  . Doxycycline Nausea And Vomiting    Pt states she gets violently sick   . Prednisone Other (See Comments)  Impacts Bipolar symptoms   . Bactrim [Sulfamethoxazole-Trimethoprim] Swelling  . Flagyl [Metronidazole] Nausea And Vomiting  . Toradol [Ketorolac Tromethamine] Other (See Comments)    Makes arms tingly  . Morphine And Related Palpitations    Minor hives - pt is okay to take, hasn't needed benadryl in past    Medications Prior to Admission  Medication Sig Dispense Refill Last Dose  . acetaminophen (TYLENOL) 325 MG tablet Take 2 tablets (650 mg total) by mouth every 6 (six) hours as needed for mild pain or moderate pain. (May buy over the counter) 1 tablet 0 Past Week at Unknown time  . ARIPiprazole (ABILIFY) 2 MG tablet Take 0.5 tablets (1 mg total) by mouth daily. For mood (Patient not taking: Reported on 11/17/2018) 15 tablet 0 Not Taking at Unknown time  . benzocaine (ORAJEL) 10 % mucosal gel Use as directed in the mouth or throat 4 (four) times daily as needed for mouth pain. (May buy over the counter) (Patient not taking: Reported on 11/17/2018) 5.3 g 0 Completed Course at Unknown time  . clonazePAM (KLONOPIN) 0.5 MG tablet Take 1 tablet (0.5 mg total) by mouth 2 (two) times daily as needed (anxiety or insomnia). 10 tablet 0 11/17/2018 at Unknown time  . dicyclomine  (BENTYL) 20 MG tablet Take 1 tablet (20 mg total) by mouth 2 (two) times daily. 20 tablet 0   . drospirenone-ethinyl estradiol (YAZ,GIANVI,LORYNA) 3-0.02 MG tablet Take 1 tablet by mouth daily.   11/16/2018 at Unknown time  . hydrOXYzine (ATARAX/VISTARIL) 25 MG tablet Take 1 tablet (25 mg total) by mouth every 6 (six) hours as needed (anxiety/agitation or CIWA < or = 10). 30 tablet 0 Past Week at Unknown time  . mirtazapine (REMERON) 15 MG tablet Take 1 tablet (15 mg total) by mouth at bedtime. For mood 30 tablet 0 11/16/2018 at Unknown time  . ondansetron (ZOFRAN ODT) 4 MG disintegrating tablet Take 1 tablet (4 mg total) by mouth every 8 (eight) hours as needed for nausea. 10 tablet 0   . sertraline (ZOLOFT) 25 MG tablet Take 0.5 tablets (12.5 mg total) by mouth daily. For mood (Patient taking differently: Take 25 mg by mouth daily. For mood) 15 tablet 0 11/17/2018 at Unknown time  . simethicone (GAS-X) 80 MG chewable tablet Chew 1 tablet (80 mg total) by mouth every 6 (six) hours as needed for flatulence. 30 tablet 0     Review of Systems  Constitutional: Negative for chills and fever.  Respiratory: Negative for cough and shortness of breath.   Gastrointestinal: Positive for abdominal pain, diarrhea and nausea. Negative for constipation and vomiting.  Genitourinary: Positive for vaginal bleeding and vaginal discharge. Negative for difficulty urinating and dysuria.  Neurological: Positive for syncope (This morning-11am). Negative for dizziness, light-headedness and headaches.   Physical Exam   Blood pressure (!) 99/59, pulse 86, temperature 98.1 F (36.7 C), resp. rate 19, weight 57.6 kg, last menstrual period 12/01/2018.   Physical Exam  Constitutional: She is oriented to person, place, and time. She appears well-developed and well-nourished. She appears distressed.  HENT:  Head: Normocephalic and atraumatic.  Eyes: Conjunctivae are normal.  Neck: Normal range of motion.  Cardiovascular:  Normal rate.  Respiratory: Effort normal.  GI: Soft. She exhibits no distension and no mass. There is abdominal tenderness (RLQ). There is no rebound and no guarding.  Genitourinary: Uterus is not enlarged. Cervix exhibits no motion tenderness, no discharge and no friability.    Vaginal discharge present.  No vaginal bleeding.  No bleeding in the vagina.    Genitourinary Comments: Sterile Speculum Exam: -Vaginal Vault: Pink mucosa.  Moderate amt white discharge -wet prep collected -Cervix:Pink, no lesions, cysts, or polyps.  Appears closed. No active bleeding from os. -Bimanual Exam: Closed Tenderness in cul de sac-right side.  UTA adnexa   Musculoskeletal: Normal range of motion.  Neurological: She is alert and oriented to person, place, and time.  Skin: Skin is warm and dry.  Psychiatric: Her behavior is normal. Her mood appears anxious. Her affect is labile.  Patient tearful and with rapid breathing throughout pelvic exam, but able to calm with staff support.      MAU Course  Procedures Results for orders placed or performed during the hospital encounter of 02/16/19 (from the past 24 hour(s))  Urinalysis, Routine w reflex microscopic     Status: Abnormal   Collection Time: 02/16/19  9:17 PM  Result Value Ref Range   Color, Urine YELLOW YELLOW   APPearance CLEAR CLEAR   Specific Gravity, Urine 1.004 (L) 1.005 - 1.030   pH 6.0 5.0 - 8.0   Glucose, UA NEGATIVE NEGATIVE mg/dL   Hgb urine dipstick NEGATIVE NEGATIVE   Bilirubin Urine NEGATIVE NEGATIVE   Ketones, ur NEGATIVE NEGATIVE mg/dL   Protein, ur NEGATIVE NEGATIVE mg/dL   Nitrite NEGATIVE NEGATIVE   Leukocytes,Ua NEGATIVE NEGATIVE  Wet prep, genital     Status: Abnormal   Collection Time: 02/16/19 10:10 PM  Result Value Ref Range   Yeast Wet Prep HPF POC NONE SEEN NONE SEEN   Trich, Wet Prep NONE SEEN NONE SEEN   Clue Cells Wet Prep HPF POC NONE SEEN NONE SEEN   WBC, Wet Prep HPF POC MODERATE (A) NONE SEEN   Sperm  NONE SEEN   hCG, quantitative, pregnancy     Status: Abnormal   Collection Time: 02/16/19 11:48 PM  Result Value Ref Range   hCG, Beta Chain, Quant, S 21,252 (H) <5 mIU/mL  CBC with Differential/Platelet     Status: Abnormal   Collection Time: 02/16/19 11:48 PM  Result Value Ref Range   WBC 9.3 4.0 - 10.5 K/uL   RBC 3.87 3.87 - 5.11 MIL/uL   Hemoglobin 12.2 12.0 - 15.0 g/dL   HCT 16.1 (L) 09.6 - 04.5 %   MCV 91.7 80.0 - 100.0 fL   MCH 31.5 26.0 - 34.0 pg   MCHC 34.4 30.0 - 36.0 g/dL   RDW 40.9 81.1 - 91.4 %   Platelets 282 150 - 400 K/uL   nRBC 0.0 0.0 - 0.2 %   Neutrophils Relative % 51 %   Neutro Abs 4.7 1.7 - 7.7 K/uL   Lymphocytes Relative 41 %   Lymphs Abs 3.8 0.7 - 4.0 K/uL   Monocytes Relative 6 %   Monocytes Absolute 0.6 0.1 - 1.0 K/uL   Eosinophils Relative 1 %   Eosinophils Absolute 0.1 0.0 - 0.5 K/uL   Basophils Relative 1 %   Basophils Absolute 0.1 0.0 - 0.1 K/uL   Immature Granulocytes 0 %   Abs Immature Granulocytes 0.03 0.00 - 0.07 K/uL  Comprehensive metabolic panel     Status: Abnormal   Collection Time: 02/16/19 11:48 PM  Result Value Ref Range   Sodium 138 135 - 145 mmol/L   Potassium 3.3 (L) 3.5 - 5.1 mmol/L   Chloride 108 98 - 111 mmol/L   CO2 20 (L) 22 - 32 mmol/L   Glucose, Bld 90 70 - 99 mg/dL  BUN 6 6 - 20 mg/dL   Creatinine, Ser 1.61 0.44 - 1.00 mg/dL   Calcium 9.0 8.9 - 09.6 mg/dL   Total Protein 7.3 6.5 - 8.1 g/dL   Albumin 4.4 3.5 - 5.0 g/dL   AST 19 15 - 41 U/L   ALT 16 0 - 44 U/L   Alkaline Phosphatase 70 38 - 126 U/L   Total Bilirubin 1.1 0.3 - 1.2 mg/dL   GFR calc non Af Amer >60 >60 mL/min   GFR calc Af Amer >60 >60 mL/min   Anion gap 10 5 - 15   Mr Pelvis Wo Contrast  Result Date: 02/17/2019 CLINICAL DATA:  Right lower quadrant pain.  Eleven weeks pregnant. EXAM: MRI ABDOMEN AND PELVIS WITHOUT CONTRAST TECHNIQUE: Multiplanar multisequence MR imaging of the abdomen and pelvis was performed. No intravenous contrast was  administered. COMPARISON:  Pelvic ultrasound 02/16/2019 FINDINGS: COMBINED FINDINGS FOR BOTH MR ABDOMEN AND PELVIS Lower chest: Normal Hepatobiliary: Liver signal is normal. No biliary dilatation. The gallbladder is absent. Normal common bile duct. Pancreas: Normal pancreatic signal and contours. Normal pancreatic duct. Spleen:  Normal Adrenals/Urinary Tract: Normal adrenal glands and kidneys. Unremarkable urinary bladder. Stomach/Bowel: There is a large amount of gas in the cecum and ascending colon, causing magnetic susceptibility effect and degrading fat suppression. The appendix is not visualized. No dilated small bowel. No visible right lower quadrant inflammation. Vascular/Lymphatic:  Normal Reproductive: Early intrauterine pregnancy. There is an 11 mm cyst near the lower pole of the right ovary that appears in continuity with small amount of right pelvic fluid tracking into the posterior cul-de-sac. There is mild right ovarian edema. Other:  None Musculoskeletal: Normal marrow signal. IMPRESSION: 1. Nonvisualization of the appendix. The right lower quadrant is partially obscured by magnetic susceptibility effect from gas within the cecum and ascending colon. 2. Small right ovarian cyst in continuity with fluid tracking along the right pelvic sidewall into the posterior cul-de-sac, with mild right ovarian edema, possibly indicating recently ruptured ovarian cyst. 3. Early intrauterine pregnancy. Electronically Signed   By: Deatra Robinson M.D.   On: 02/17/2019 03:27   Mr Abdomen Wo Contrast  Result Date: 02/17/2019 CLINICAL DATA:  Right lower quadrant pain.  Eleven weeks pregnant. EXAM: MRI ABDOMEN AND PELVIS WITHOUT CONTRAST TECHNIQUE: Multiplanar multisequence MR imaging of the abdomen and pelvis was performed. No intravenous contrast was administered. COMPARISON:  Pelvic ultrasound 02/16/2019 FINDINGS: COMBINED FINDINGS FOR BOTH MR ABDOMEN AND PELVIS Lower chest: Normal Hepatobiliary: Liver signal is  normal. No biliary dilatation. The gallbladder is absent. Normal common bile duct. Pancreas: Normal pancreatic signal and contours. Normal pancreatic duct. Spleen:  Normal Adrenals/Urinary Tract: Normal adrenal glands and kidneys. Unremarkable urinary bladder. Stomach/Bowel: There is a large amount of gas in the cecum and ascending colon, causing magnetic susceptibility effect and degrading fat suppression. The appendix is not visualized. No dilated small bowel. No visible right lower quadrant inflammation. Vascular/Lymphatic:  Normal Reproductive: Early intrauterine pregnancy. There is an 11 mm cyst near the lower pole of the right ovary that appears in continuity with small amount of right pelvic fluid tracking into the posterior cul-de-sac. There is mild right ovarian edema. Other:  None Musculoskeletal: Normal marrow signal. IMPRESSION: 1. Nonvisualization of the appendix. The right lower quadrant is partially obscured by magnetic susceptibility effect from gas within the cecum and ascending colon. 2. Small right ovarian cyst in continuity with fluid tracking along the right pelvic sidewall into the posterior cul-de-sac, with mild right ovarian edema, possibly  indicating recently ruptured ovarian cyst. 3. Early intrauterine pregnancy. Electronically Signed   By: Deatra Robinson M.D.   On: 02/17/2019 03:27   US Ob Transvaginal  Result Date: 02/16/2019 CLINICAL DATA:  Right lower quadrant pain EXAM: TRANSVAGINAL OB ULTRASOUND TECHNIQUE: Transvaginal ultrasound was performed for complete evaluation of the gestation as well as the maternal uterus, adnexal regions, and pelvic cul-de-sac. COMPARISON:  02/09/2019 ultrasound FINDINGS: Intrauterine gestational sac: Single Yolk sac:  Visualized. Embryo:  Not Visualized. MSD: 11 mm   5 w   5 d Subchorionic hemorrhage:  None visualized. Maternal uterus/adnexae: Ovaries are within normal limits. The left ovary measures 2.1 x 1.1 x 1.3 cm. The right ovary measures 2.9 x 1.9  x 2.1 cm. Small amount of free fluid. IMPRESSION: Single intrauterine pregnancy with visible gestational sac and yolk sac. Consider follow-up ultrasound in 10-14 days to confirm viability. Small free fluid. Electronically Signed   By: Jasmine Pang M.D.   On: 02/16/2019 23:45   US Appendix (abdomen Limited)  Result Date: 02/16/2019 CLINICAL DATA:  Right lower quadrant pain EXAM: ULTRASOUND ABDOMEN LIMITED TECHNIQUE: Wallace Cullens scale imaging of the right lower quadrant was performed to evaluate for suspected appendicitis. Standard imaging planes and graded compression technique were utilized. COMPARISON:  MRI 02/10/2019 FINDINGS: Best seen on seen cine sagittal images is a tubular structure in the right lower quadrant, possibly blind ending measuring up to 9 mm in diameter. Sonographer reports focal tenderness to the region. Ancillary findings: None. Factors affecting image quality: Copious bowel gas. IMPRESSION: Possible dilated tubular structure up to 9 mm in the right lower quadrant, best seen on sagittal cine images, potentially represents enlarged appendix. If clinical suspicion for appendicitis remains high, repeat MRI should be considered. Electronically Signed   By: Jasmine Pang M.D.   On: 02/16/2019 23:42    MDM Pelvic Exam Labs; Wet Prep, UA, CBC, CMP, bhCG Pain Medication TV and Abdominal US MRI-Pelvic and Abdominal  Assessment and Plan  28 year old G1P0 IUS at Unknown GA Vaginal Bleeding RLQ Pain  -Exam findings discussed. -Patient requests pain medication via IV. -Provider informed that medication will be either IM or oral. -Dilaudid 0.5mg  given via IM  -Pelvic exam performed with collection of wet prep. -Will send for [place] -Will await results.   Follow Up (12:14 AM) Possible Enlarged Appendix IUGS  -CBC, CMP, and Beta hCG ordered. -Final [place] results return significant for possible enlarged appendix. -Will start IV and send for MRI to confirm. -In room to discuss results with  patient and POC. -Patient calm, with slurred speech, but reports continued pain.  -Questions regarding pregnancy [place] addressed and patient informed of need for f/u US in 10-14 weeks. -Insert Saline Lock per MRI protocol. -Abdominal MRI w/wo contrast ordered -Will await results.   Follow Up (1610) -Radiologist suggests modification in order: Abdominal and Pelvic MRI w/o contrast ordered. -Give Dilaudid 0.5mg  IV now -CBC and CMP return WNL. -Send for MRI when ready -Will await results.    Follow Up (3:38 AM) No apparent Appendicitis  -MRI results discussed with patient. -Informed findings c/w MRI completed on 02/10/2019 at [facility]. -Patient continues to report pain 9/10., but admits that it was initially improved with IV dilaudid dosing prior to MRI to 6/10. -Patient informed of discharge, but strongly encouraged and instructed to report back to hospital with any worsening pain or onset of new symptoms.  -Patient verbalized understanding and without questions or concerns.  -Patient requests medications prior to discharge-Percocet 5/325  one tablet given. -Rx for percocet 2.5/325 Disp 8, RF 0 sent to pharmacy on file.  -Patient instructed to follow up with primary ob or primary care provider in 3-4 days. -No other questions or concerns.  -Discharged to home in stable condition.  Cherre Robins  MSN, CNM 02/16/2019, 9:29 PM

## 2019-02-16 NOTE — MAU Note (Signed)
States her pain she was seen for the other day has gotten worse.  Reports she has tried everything and it is unbearable.  Last took tylenol at 1900 with no relief.  No increase in VB since last time.

## 2019-02-17 ENCOUNTER — Telehealth (HOSPITAL_COMMUNITY): Payer: Self-pay | Admitting: *Deleted

## 2019-02-17 ENCOUNTER — Inpatient Hospital Stay (HOSPITAL_COMMUNITY): Payer: Medicaid Other

## 2019-02-17 LAB — COMPREHENSIVE METABOLIC PANEL
ALT: 16 U/L (ref 0–44)
AST: 19 U/L (ref 15–41)
Albumin: 4.4 g/dL (ref 3.5–5.0)
Alkaline Phosphatase: 70 U/L (ref 38–126)
Anion gap: 10 (ref 5–15)
BUN: 6 mg/dL (ref 6–20)
CO2: 20 mmol/L — ABNORMAL LOW (ref 22–32)
Calcium: 9 mg/dL (ref 8.9–10.3)
Chloride: 108 mmol/L (ref 98–111)
Creatinine, Ser: 0.57 mg/dL (ref 0.44–1.00)
GFR calc Af Amer: >60 mL/min (ref >60)
GFR calc non Af Amer: >60 mL/min (ref >60)
Glucose, Bld: 90 mg/dL (ref 70–99)
Potassium: 3.3 mmol/L — ABNORMAL LOW (ref 3.5–5.1)
Sodium: 138 mmol/L (ref 135–145)
Total Bilirubin: 1.1 mg/dL (ref 0.3–1.2)
Total Protein: 7.3 g/dL (ref 6.5–8.1)

## 2019-02-17 LAB — CBC WITH DIFFERENTIAL/PLATELET
Abs Immature Granulocytes: 0.03 10*3/uL (ref 0.00–0.07)
Basophils Absolute: 0.1 10*3/uL (ref 0.0–0.1)
Basophils Relative: 1 %
Eosinophils Absolute: 0.1 10*3/uL (ref 0.0–0.5)
Eosinophils Relative: 1 %
HCT: 35.5 % — ABNORMAL LOW (ref 36.0–46.0)
Hemoglobin: 12.2 g/dL (ref 12.0–15.0)
Immature Granulocytes: 0 %
Lymphocytes Relative: 41 %
Lymphs Abs: 3.8 10*3/uL (ref 0.7–4.0)
MCH: 31.5 pg (ref 26.0–34.0)
MCHC: 34.4 g/dL (ref 30.0–36.0)
MCV: 91.7 fL (ref 80.0–100.0)
Monocytes Absolute: 0.6 10*3/uL (ref 0.1–1.0)
Monocytes Relative: 6 %
Neutro Abs: 4.7 10*3/uL (ref 1.7–7.7)
Neutrophils Relative %: 51 %
Platelets: 282 10*3/uL (ref 150–400)
RBC: 3.87 MIL/uL (ref 3.87–5.11)
RDW: 11.8 % (ref 11.5–15.5)
WBC: 9.3 10*3/uL (ref 4.0–10.5)
nRBC: 0 % (ref 0.0–0.2)

## 2019-02-17 LAB — HCG, QUANTITATIVE, PREGNANCY: hCG, Beta Chain, Quant, S: 21252 m[IU]/mL — ABNORMAL HIGH (ref <5)

## 2019-02-17 MED ORDER — OXYCODONE-ACETAMINOPHEN 5-325 MG PO TABS
1.0000 | ORAL_TABLET | Freq: Once | ORAL | Status: AC
  Administered 2019-02-17: 1 via ORAL
  Filled 2019-02-17: qty 1

## 2019-02-17 MED ORDER — OXYCODONE-ACETAMINOPHEN 2.5-325 MG PO TABS: 1.0000 | ORAL_TABLET | Freq: Four times a day (QID) | ORAL | 0 refills | Status: AC | PRN

## 2019-02-17 MED ORDER — HYDROMORPHONE HCL 1 MG/ML IJ SOLN
0.5000 mg | Freq: Once | INTRAMUSCULAR | Status: AC
  Administered 2019-02-17: 0.5 mg via INTRAVENOUS
  Filled 2019-02-17: qty 1

## 2019-02-17 MED ORDER — HYDROMORPHONE HCL 1 MG/ML IJ SOLN: 1.0000 mg | Freq: Once | INTRAMUSCULAR | Status: DC

## 2019-02-17 NOTE — Discharge Instructions (Signed)
Abdominal Pain During Pregnancy  Abdominal pain is common during pregnancy, and has many possible causes. Some causes are more serious than others, and sometimes the cause is not known. Abdominal pain can be a sign that labor is starting. It can also be caused by normal growth and stretching of muscles and ligaments during pregnancy. Always tell your health care provider if you have any abdominal pain. Follow these instructions at home:  Do not have sex or put anything in your vagina until your pain goes away completely.  Get plenty of rest until your pain improves.  Drink enough fluid to keep your urine pale yellow.  Take over-the-counter and prescription medicines only as told by your health care provider.  Keep all follow-up visits as told by your health care provider. This is important. Contact a health care provider if:  Your pain continues or gets worse after resting.  You have lower abdominal pain that: ? Comes and goes at regular intervals. ? Spreads to your back. ? Is similar to menstrual cramps.  You have pain or burning when you urinate. Get help right away if:  You have a fever or chills.  You have vaginal bleeding.  You are leaking fluid from your vagina.  You are passing tissue from your vagina.  You have vomiting or diarrhea that lasts for more than 24 hours.  Your baby is moving less than usual.  You feel very weak or faint.  You have shortness of breath.  You develop severe pain in your upper abdomen. Summary  Abdominal pain is common during pregnancy, and has many possible causes.  If you experience abdominal pain during pregnancy, tell your health care provider right away.  Follow your health care provider's home care instructions and keep all follow-up visits as directed. This information is not intended to replace advice given to you by your health care provider. Make sure you discuss any questions you have with your health care  provider. Document Released: 09/04/2005 Document Revised: 12/07/2016 Document Reviewed: 12/07/2016 Elsevier Interactive Patient Education  2019 [employer].  [facility] Prenatal Care Providers   [facility] for [employer] [facility] at [facility]       Phone: [phone]  [facility] for [employer] at [place] Phone: [phone]  [facility] for [employer] at [place]  Phone: [phone]  [facility] at [facility]  Phone: [phone]  [facility] for [facility] at [facility]  Phone: [phone]  [facility]       Phone: [phone]  [facility]    Phone: [phone]   [facility])    Phone: [phone]  [facility]    Phone: [phone]  [facility]    Phone: [phone]   [facility]  Phone: [phone]  [facility]    Phone: [phone]  [facility] [place]   Phone: [phone]  [facility]    Phone: [phone]  First Trimester of Pregnancy The first trimester of pregnancy is from week 1 until the end of week 13 (months 1 through 3). A week after a sperm fertilizes an egg, the egg will implant on the wall of the uterus. This embryo will begin to develop into a baby. Genes from you and your partner will form the baby. The female genes will determine whether the baby will be a boy or a girl. At 6-8 weeks, the eyes and face will be formed, and the heartbeat  can be seen on ultrasound. At the end of 12 weeks, all the baby's organs will be formed. Now that you are pregnant, you will want to do everything you can to have a healthy baby. Two of the most important things are to get good prenatal care and to follow your health care provider's instructions. Prenatal care is all the medical care you receive before the baby's birth. This care will  help prevent, find, and treat any problems during the pregnancy and childbirth. Body changes during your first trimester Your body goes through many changes during pregnancy. The changes vary from woman to woman.  You may gain or lose a couple of pounds at first.  You may feel sick to your stomach (nauseous) and you may throw up (vomit). If the vomiting is uncontrollable, call your health care provider.  You may tire easily.  You may develop headaches that can be relieved by medicines. All medicines should be approved by your health care provider.  You may urinate more often. Painful urination may mean you have a bladder infection.  You may develop heartburn as a result of your pregnancy.  You may develop constipation because certain hormones are causing the muscles that push stool through your intestines to slow down.  You may develop hemorrhoids or swollen veins (varicose veins).  Your breasts may begin to grow larger and become tender. Your nipples may stick out more, and the tissue that surrounds them (areola) may become darker.  Your gums may bleed and may be sensitive to brushing and flossing.  Dark spots or blotches (chloasma, mask of pregnancy) may develop on your face. This will likely fade after the baby is born.  Your menstrual periods will stop.  You may have a loss of appetite.  You may develop cravings for certain kinds of food.  You may have changes in your emotions from day to day, such as being excited to be pregnant or being concerned that something may go wrong with the pregnancy and baby.  You may have more vivid and strange dreams.  You may have changes in your hair. These can include thickening of your hair, rapid growth, and changes in texture. Some women also have hair loss during or after pregnancy, or hair that feels dry or thin. Your hair will most likely return to normal after your baby is born. What to expect at prenatal visits During a routine  prenatal visit:  You will be weighed to make sure you and the baby are growing normally.  Your blood pressure will be taken.  Your abdomen will be measured to track your baby's growth.  The fetal heartbeat will be listened to between weeks 10 and 14 of your pregnancy.  Test results from any previous visits will be discussed. Your health care provider may ask you:  How you are feeling.  If you are feeling the baby move.  If you have had any abnormal symptoms, such as leaking fluid, bleeding, severe headaches, or abdominal cramping.  If you are using any tobacco products, including cigarettes, chewing tobacco, and electronic cigarettes.  If you have any questions. Other tests that may be performed during your first trimester include:  Blood tests to find your blood type and to check for the presence of any previous infections. The tests will also be used to check for low iron levels (anemia) and protein on red blood cells (Rh antibodies). Depending on your risk factors, or if you previously had diabetes during pregnancy, you may  have tests to check for high blood sugar that affects pregnant women (gestational diabetes).  Urine tests to check for infections, diabetes, or protein in the urine.  An ultrasound to confirm the proper growth and development of the baby.  Fetal screens for spinal cord problems (spina bifida) and Down syndrome.  HIV (human immunodeficiency virus) testing. Routine prenatal testing includes screening for HIV, unless you choose not to have this test.  You may need other tests to make sure you and the baby are doing well. Follow these instructions at home: Medicines  Follow your health care provider's instructions regarding medicine use. Specific medicines may be either safe or unsafe to take during pregnancy.  Take a prenatal vitamin that contains at least 600 micrograms (mcg) of folic acid.  If you develop constipation, try taking a stool softener if  your health care provider approves. Eating and drinking   Eat a balanced diet that includes fresh fruits and vegetables, whole grains, good sources of protein such as meat, eggs, or tofu, and low-fat dairy. Your health care provider will help you determine the amount of weight gain that is right for you.  Avoid raw meat and uncooked cheese. These carry germs that can cause birth defects in the baby.  Eating four or five small meals rather than three large meals a day may help relieve nausea and vomiting. If you start to feel nauseous, eating a few soda crackers can be helpful. Drinking liquids between meals, instead of during meals, also seems to help ease nausea and vomiting.  Limit foods that are high in fat and processed sugars, such as fried and sweet foods.  To prevent constipation: ? Eat foods that are high in fiber, such as fresh fruits and vegetables, whole grains, and beans. ? Drink enough fluid to keep your urine clear or pale yellow. Activity  Exercise only as directed by your health care provider. Most women can continue their usual exercise routine during pregnancy. Try to exercise for 30 minutes at least 5 days a week. Exercising will help you: ? Control your weight. ? Stay in shape. ? Be prepared for labor and delivery.  Experiencing pain or cramping in the lower abdomen or lower back is a good sign that you should stop exercising. Check with your health care provider before continuing with normal exercises.  Try to avoid standing for long periods of time. Move your legs often if you must stand in one place for a long time.  Avoid heavy lifting.  Wear low-heeled shoes and practice good posture.  You may continue to have sex unless your health care provider tells you not to. Relieving pain and discomfort  Wear a good support bra to relieve breast tenderness.  Take warm sitz baths to soothe any pain or discomfort caused by hemorrhoids. Use hemorrhoid cream if your  health care provider approves.  Rest with your legs elevated if you have leg cramps or low back pain.  If you develop varicose veins in your legs, wear support hose. Elevate your feet for 15 minutes, 3-4 times a day. Limit salt in your diet. Prenatal care  Schedule your prenatal visits by the twelfth week of pregnancy. They are usually scheduled monthly at first, then more often in the last 2 months before delivery.  Write down your questions. Take them to your prenatal visits.  Keep all your prenatal visits as told by your health care provider. This is important. Safety  Wear your seat belt at all times when  driving.  Make a list of emergency phone numbers, including numbers for family, friends, the hospital, and police and fire departments. General instructions  Ask your health care provider for a referral to a local prenatal education class. Begin classes no later than the beginning of month 6 of your pregnancy.  Ask for help if you have counseling or nutritional needs during pregnancy. Your health care provider can offer advice or refer you to specialists for help with various needs.  Do not use hot tubs, steam rooms, or saunas.  Do not douche or use tampons or scented sanitary pads.  Do not cross your legs for long periods of time.  Avoid cat litter boxes and soil used by cats. These carry germs that can cause birth defects in the baby and possibly loss of the fetus by miscarriage or stillbirth.  Avoid all smoking, herbs, alcohol, and medicines not prescribed by your health care provider. Chemicals in these products affect the formation and growth of the baby.  Do not use any products that contain nicotine or tobacco, such as cigarettes and e-cigarettes. If you need help quitting, ask your health care provider. You may receive counseling support and other resources to help you quit.  Schedule a dentist appointment. At home, brush your teeth with a soft toothbrush and be gentle  when you floss. Contact a health care provider if:  You have dizziness.  You have mild pelvic cramps, pelvic pressure, or nagging pain in the abdominal area.  You have persistent nausea, vomiting, or diarrhea.  You have a bad smelling vaginal discharge.  You have pain when you urinate.  You notice increased swelling in your face, hands, legs, or ankles.  You are exposed to fifth disease or chickenpox.  You are exposed to [place] measles (rubella) and have never had it. Get help right away if:  You have a fever.  You are leaking fluid from your vagina.  You have spotting or bleeding from your vagina.  You have severe abdominal cramping or pain.  You have rapid weight gain or loss.  You vomit blood or material that looks like coffee grounds.  You develop a severe headache.  You have shortness of breath.  You have any kind of trauma, such as from a fall or a car accident. Summary  The first trimester of pregnancy is from week 1 until the end of week 13 (months 1 through 3).  Your body goes through many changes during pregnancy. The changes vary from woman to woman.  You will have routine prenatal visits. During those visits, your health care provider will examine you, discuss any test results you may have, and talk with you about how you are feeling. This information is not intended to replace advice given to you by your health care provider. Make sure you discuss any questions you have with your health care provider. Document Released: 08/29/2001 Document Revised: 08/16/2016 Document Reviewed: 08/16/2016 Elsevier Interactive Patient Education  2019 [employer].

## 2019-02-19 ENCOUNTER — Other Ambulatory Visit: Payer: Self-pay

## 2019-02-19 ENCOUNTER — Inpatient Hospital Stay (HOSPITAL_COMMUNITY)
Admission: AD | Admit: 2019-02-19 | Discharge: 2019-02-20 | Disposition: A | Discharge status: Home / Self Care | Payer: Medicaid Other | Attending: Emergency Medicine | Admitting: Emergency Medicine

## 2019-02-19 ENCOUNTER — Encounter (HOSPITAL_COMMUNITY): Payer: Self-pay

## 2019-02-19 DIAGNOSIS — F319 Bipolar disorder, unspecified: Secondary | ICD-10-CM | POA: Insufficient documentation

## 2019-02-19 DIAGNOSIS — R1031 Right lower quadrant pain: Secondary | ICD-10-CM

## 2019-02-19 DIAGNOSIS — F419 Anxiety disorder, unspecified: Secondary | ICD-10-CM | POA: Insufficient documentation

## 2019-02-19 DIAGNOSIS — Z3A00 Weeks of gestation of pregnancy not specified: Secondary | ICD-10-CM | POA: Insufficient documentation

## 2019-02-19 DIAGNOSIS — Z9049 Acquired absence of other specified parts of digestive tract: Secondary | ICD-10-CM | POA: Diagnosis not present

## 2019-02-19 DIAGNOSIS — O9989 Other specified diseases and conditions complicating pregnancy, childbirth and the puerperium: Secondary | ICD-10-CM | POA: Insufficient documentation

## 2019-02-19 DIAGNOSIS — R110 Nausea: Secondary | ICD-10-CM

## 2019-02-19 DIAGNOSIS — F17210 Nicotine dependence, cigarettes, uncomplicated: Secondary | ICD-10-CM | POA: Diagnosis not present

## 2019-02-19 DIAGNOSIS — O99340 Other mental disorders complicating pregnancy, unspecified trimester: Secondary | ICD-10-CM | POA: Diagnosis not present

## 2019-02-19 DIAGNOSIS — O99335 Smoking (tobacco) complicating the puerperium: Secondary | ICD-10-CM | POA: Diagnosis not present

## 2019-02-19 DIAGNOSIS — R197 Diarrhea, unspecified: Secondary | ICD-10-CM

## 2019-02-19 DIAGNOSIS — O26891 Other specified pregnancy related conditions, first trimester: Secondary | ICD-10-CM

## 2019-02-19 DIAGNOSIS — Z3A01 Less than 8 weeks gestation of pregnancy: Secondary | ICD-10-CM

## 2019-02-19 LAB — URINALYSIS, ROUTINE W REFLEX MICROSCOPIC
Bilirubin Urine: NEGATIVE
Glucose, UA: NEGATIVE mg/dL
Hgb urine dipstick: NEGATIVE
Ketones, ur: 80 mg/dL — AB
Leukocytes,Ua: NEGATIVE
Nitrite: NEGATIVE
Protein, ur: 30 mg/dL — AB
Specific Gravity, Urine: 1.027 (ref 1.005–1.030)
pH: 5 (ref 5.0–8.0)

## 2019-02-19 NOTE — MAU Note (Signed)
Pt reports she had an MRI when she was here last. Pt states that that she had fluid around her ovary and it ruptured. She was sent home with pain medication and told to come back if pain gets worse. Pain is worse.   Pt reports nausea

## 2019-02-19 NOTE — MAU Provider Note (Addendum)
History     CSN: [id number]  Arrival date and time: 02/19/19 [id number]   First Provider Initiated Contact with Patient 02/19/19 2028      Chief Complaint  Patient presents with  . Abdominal Pain   Tonya Davidson is a 28 y.o. G1P0 at 3037w1d who presents today with RLQ pain, nausea and diarrhea. This current episode has been ongoing for about one week. However on chart review since January pt has had 2 MRIs, 3 USs and 5 CT scans between here and [facility].   Abdominal Pain  This is a new problem. The current episode started in the past 7 days. The onset quality is gradual. The problem occurs constantly. The problem has been gradually worsening. The pain is located in the RLQ. The pain is at a severity of 10/10. The quality of the pain is sharp. The abdominal pain radiates to the back. Associated symptoms include diarrhea (started about 2 days ago) and nausea. Pertinent negatives include no dysuria, fever or frequency. The pain is aggravated by certain positions and movement. The pain is relieved by nothing. She has tried acetaminophen (lidocaine patches, epsom salt bath, essential oils, narcotic pain medicationa, heating pads ) for the symptoms. The treatment provided no relief. Prior diagnostic workup includes CT scan and ultrasound (MRI).    OB History    Gravida  1   Para      Term      Preterm      AB      Living        SAB      TAB      Ectopic      Multiple      Live Births              Past Medical History:  Diagnosis Date  . Abnormal Pap smear    colposcopy  . Anemia   . Anxiety   . Arthritis   . Bipolar 1 disorder (HCC)   . C. difficile diarrhea   . Chlamydia 04/29/2012  . Chronic pain   . Depression   . Fibromyalgia   . HPV in female   . IBS (irritable bowel syndrome)   . Interstitial cystitis   . Kidney stones   . Mononucleosis   . Ovarian cyst 01/2019   right   . Pelvic pain in female 12/14/2014  . PID (acute pelvic inflammatory disease) 04/28/2012   . Ulcer     Past Surgical History:  Procedure Laterality Date  . COLPOSCOPY    . INNER EAR SURGERY    . KIDNEY STONE SURGERY    . LAPAROSCOPIC CHOLECYSTECTOMY    . TONSILLECTOMY    . WISDOM TOOTH EXTRACTION      Family History  Problem Relation Age of Onset  . Bipolar disorder Mother   . Cancer Father   . ADD / ADHD Brother   . Cancer Maternal Grandmother   . Cancer Maternal Grandfather   . Cancer Paternal Grandmother   . Cancer Paternal Grandfather     Social History   Tobacco Use  . Smoking status: Current Every Day Smoker    Packs/day: 0.50    Years: 5.00    Pack years: 2.50    Types: Cigarettes  . Smokeless tobacco: Never Used  Substance Use Topics  . Alcohol use: No  . Drug use: Not Currently    Types: Marijuana    Comment: last used 3wks ago     Allergies:  Allergies  Allergen  Reactions  . Azithromycin Other (See Comments)    GI upset and abdominal pain  . Doxycycline Nausea And Vomiting    Pt states she gets violently sick   . Prednisone Other (See Comments)    Impacts Bipolar symptoms   . Bactrim [Sulfamethoxazole-Trimethoprim] Swelling  . Flagyl [Metronidazole] Nausea And Vomiting  . Toradol [Ketorolac Tromethamine] Other (See Comments)    Makes arms tingly  . Morphine And Related Palpitations    Minor hives - pt is okay to take, hasn't needed benadryl in past    Medications Prior to Admission  Medication Sig Dispense Refill Last Dose  . acetaminophen (TYLENOL) 325 MG tablet Take 2 tablets (650 mg total) by mouth every 6 (six) hours as needed for mild pain or moderate pain. (May buy over the counter) 1 tablet 0 02/19/2019 at Unknown time  . clonazePAM (KLONOPIN) 0.5 MG tablet Take 1 tablet (0.5 mg total) by mouth 2 (two) times daily as needed (anxiety or insomnia). 10 tablet 0 Past Month at Unknown time  . ondansetron (ZOFRAN ODT) 4 MG disintegrating tablet Take 1 tablet (4 mg total) by mouth every 8 (eight) hours as needed for nausea. 10 tablet 0  02/18/2019 at Unknown time  . oxycodone-acetaminophen (PERCOCET) 2.5-325 MG tablet Take 1-2 tablets by mouth every 6 (six) hours as needed for up to 2 days for pain. 8 tablet 0 02/18/2019 at Unknown time  . simethicone (GAS-X) 80 MG chewable tablet Chew 1 tablet (80 mg total) by mouth every 6 (six) hours as needed for flatulence. 30 tablet 0 02/18/2019 at Unknown time  . dicyclomine (BENTYL) 20 MG tablet Take 1 tablet (20 mg total) by mouth 2 (two) times daily. 20 tablet 0   . hydrOXYzine (ATARAX/VISTARIL) 25 MG tablet Take 1 tablet (25 mg total) by mouth every 6 (six) hours as needed (anxiety/agitation or CIWA < or = 10). 30 tablet 0 Past Week at Unknown time    Review of Systems  Constitutional: Negative for chills and fever.  Gastrointestinal: Positive for abdominal pain, diarrhea (started about 2 days ago) and nausea.  Genitourinary: Negative for dysuria, frequency, vaginal bleeding and vaginal discharge.  Musculoskeletal: Positive for back pain.   Physical Exam   Blood pressure 104/65, pulse (!) 103, temperature 98.5 F (36.9 C), resp. rate (!) 24, weight 55.9 kg, last menstrual period 12/01/2018, SpO2 98 %.  Physical Exam  Nursing note and vitals reviewed. Constitutional: She is oriented to person, place, and time. She appears well-developed and well-nourished. No distress.  HENT:  Head: Normocephalic.  Cardiovascular: Normal rate.  Respiratory: Effort normal.  GI: Soft. There is no abdominal tenderness. There is no rebound.  Neurological: She is alert and oriented to person, place, and time.  Skin: Skin is warm and dry.   Results for orders placed or performed during the hospital encounter of 02/19/19 (from the past 24 hour(s))  Urinalysis, Routine w reflex microscopic     Status: Abnormal   Collection Time: 02/19/19  7:37 PM  Result Value Ref Range   Color, Urine YELLOW YELLOW   APPearance CLEAR CLEAR   Specific Gravity, Urine 1.027 1.005 - 1.030   pH 5.0 5.0 - 8.0   Glucose,  UA NEGATIVE NEGATIVE mg/dL   Hgb urine dipstick NEGATIVE NEGATIVE   Bilirubin Urine NEGATIVE NEGATIVE   Ketones, ur 80 (A) NEGATIVE mg/dL   Protein, ur 30 (A) NEGATIVE mg/dL   Nitrite NEGATIVE NEGATIVE   Leukocytes,Ua NEGATIVE NEGATIVE   RBC / HPF 0-5  0 - 5 RBC/hpf   WBC, UA 0-5 0 - 5 WBC/hpf   Bacteria, UA RARE (A) NONE SEEN   Squamous Epithelial / LPF 0-5 0 - 5   Mucus PRESENT    Uric Acid Crys, UA PRESENT     MAU Course  Procedures  MDM Bedside US shows gestational sac. Difficult to fully assess due to early gestation however patient has already had confirmed IUP. Will send to ED for complete evaluation as this pain is more acute on chronic in nature and likely not related to the pregnancy at this time.   Assessment and Plan  RLQ abdominal pain Patient to ED for evaluation   Thressa Sheller DNP, CNM  02/19/19  8:35 PM

## 2019-02-20 ENCOUNTER — Encounter (HOSPITAL_COMMUNITY): Payer: Self-pay | Admitting: Emergency Medicine

## 2019-02-20 LAB — BASIC METABOLIC PANEL
Anion gap: 11 (ref 5–15)
BUN: 6 mg/dL (ref 6–20)
CO2: 20 mmol/L — ABNORMAL LOW (ref 22–32)
Calcium: 9.4 mg/dL (ref 8.9–10.3)
Chloride: 103 mmol/L (ref 98–111)
Creatinine, Ser: 0.51 mg/dL (ref 0.44–1.00)
GFR calc Af Amer: >60 mL/min (ref >60)
GFR calc non Af Amer: >60 mL/min (ref >60)
Glucose, Bld: 80 mg/dL (ref 70–99)
Potassium: 3.8 mmol/L (ref 3.5–5.1)
Sodium: 134 mmol/L — ABNORMAL LOW (ref 135–145)

## 2019-02-20 LAB — CBC
HCT: 35.6 % — ABNORMAL LOW (ref 36.0–46.0)
Hemoglobin: 12.3 g/dL (ref 12.0–15.0)
MCH: 31.1 pg (ref 26.0–34.0)
MCHC: 34.6 g/dL (ref 30.0–36.0)
MCV: 90.1 fL (ref 80.0–100.0)
Platelets: 308 10*3/uL (ref 150–400)
RBC: 3.95 MIL/uL (ref 3.87–5.11)
RDW: 11.9 % (ref 11.5–15.5)
WBC: 8.1 10*3/uL (ref 4.0–10.5)
nRBC: 0 % (ref 0.0–0.2)

## 2019-02-20 MED ORDER — SODIUM CHLORIDE 0.9 % IV BOLUS
1000.0000 mL | Freq: Once | INTRAVENOUS | Status: AC
  Administered 2019-02-20: 1000 mL via INTRAVENOUS

## 2019-02-20 MED ORDER — OXYCODONE-ACETAMINOPHEN 5-325 MG PO TABS
1.0000 | ORAL_TABLET | Freq: Once | ORAL | Status: AC
  Administered 2019-02-20: 1 via ORAL
  Filled 2019-02-20: qty 1

## 2019-02-20 MED ORDER — DOXYLAMINE-PYRIDOXINE 10-10 MG PO TBEC: 2.0000 | DELAYED_RELEASE_TABLET | Freq: Every evening | ORAL | 0 refills | Status: AC | PRN

## 2019-02-20 MED ORDER — HYDROMORPHONE HCL 1 MG/ML IJ SOLN
0.5000 mg | Freq: Once | INTRAMUSCULAR | Status: AC
  Administered 2019-02-20: 0.5 mg via INTRAVENOUS
  Filled 2019-02-20: qty 1

## 2019-02-20 MED ORDER — METOCLOPRAMIDE HCL 5 MG/ML IJ SOLN
10.0000 mg | INTRAMUSCULAR | Status: AC
  Administered 2019-02-20: 10 mg via INTRAVENOUS
  Filled 2019-02-20: qty 2

## 2019-02-20 MED ORDER — PROCHLORPERAZINE EDISYLATE 10 MG/2ML IJ SOLN
5.0000 mg | Freq: Once | INTRAMUSCULAR | Status: DC
  Filled 2019-02-20: qty 2

## 2019-02-20 NOTE — Discharge Instructions (Addendum)
Your blood work is unchanged compared to prior work-ups.  Given your reassuring imaging over the past few weeks, we advise follow-up with your primary care doctor as well as an OB/GYN.  Take Tylenol for management of abdominal pain.  You may continue with topical heat pads as needed.  You have been prescribed likely just to use for management of nausea in pregnancy.

## 2019-02-20 NOTE — ED Provider Notes (Signed)
[facility] EMERGENCY DEPARTMENT Provider Note   CSN: [id number] Arrival date & time: 02/19/19  1926    History   Chief Complaint Chief Complaint  Patient presents with  . Abdominal Pain    HPI Tonya Davidson is a 28 y.o. female.     28 year old female with a history of bipolar 1 disorder, chronic pain and fibromyalgia, IBS, polysubstance abuse presents to the emergency department in transfer from MAU.  Patient G1P0, currently pregnant, c/o ongoing pain in her RLQ.  States that pain has been constant.  She was given a prescription for Percocet which she finished yesterday.  The pain is stabbing and nonradiating.  She has had some nausea associated with her symptoms.  Was told that she had an ovarian cyst rupture and to come back if her pain worsened.  She has not had any fevers, urinary symptoms, bowel changes.  Abdominal surgical history significant for cholecystectomy.   Abdominal Pain    Past Medical History:  Diagnosis Date  . Abnormal Pap smear    colposcopy  . Anemia   . Anxiety   . Arthritis   . Bipolar 1 disorder (HCC)   . C. difficile diarrhea   . Chlamydia 04/29/2012  . Chronic pain   . Depression   . Fibromyalgia   . HPV in female   . IBS (irritable bowel syndrome)   . Interstitial cystitis   . Kidney stones   . Mononucleosis   . Ovarian cyst 01/2019   right   . Pelvic pain in female 12/14/2014  . PID (acute pelvic inflammatory disease) 04/28/2012  . Ulcer     Patient Active Problem List   Diagnosis Date Noted  . Pelvic pain affecting pregnancy 02/13/2019  . Diarrhea 02/13/2019  . Elevated bilirubin 02/12/2019  . Polysubstance abuse (HCC) 10/13/2018  . 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 Pyelonephritis 09/15/2016  . Right flank pain   . Pelvic pain in female 12/14/2014  . Severe episode of recurrent major depressive disorder (HCC) 10/03/2013  . GAD (generalized anxiety  disorder) 09/30/2013  . PID (acute pelvic inflammatory disease) 04/28/2012  . ANXIETY 05/18/2009  . IBS 05/18/2009    Past Surgical History:  Procedure Laterality Date  . COLPOSCOPY    . INNER EAR SURGERY    . KIDNEY STONE SURGERY    . LAPAROSCOPIC CHOLECYSTECTOMY    . TONSILLECTOMY    . WISDOM TOOTH EXTRACTION       OB History    Gravida  1   Para      Term      Preterm      AB      Living        SAB      TAB      Ectopic      Multiple      Live Births               Home Medications    Prior to Admission medications   Medication Sig Start Date End Date Taking? Authorizing Provider  acetaminophen (TYLENOL) 325 MG tablet Take 2 tablets (650 mg total) by mouth every 6 (six) hours as needed for mild pain or moderate pain. (May buy over the counter) 10/15/18  Yes Aldean Baker, NP  clonazePAM (KLONOPIN) 0.5 MG tablet Take 1 tablet (0.5 mg total) by mouth 2 (two) times daily as needed (anxiety or insomnia). 10/15/18  Yes Aldean Baker, NP  ondansetron (ZOFRAN ODT) 4 MG disintegrating tablet Take 1 tablet (4 mg total) by mouth every 8 (eight) hours as needed for nausea. 11/18/18  Yes Garlon Hatchet, PA-C  simethicone (GAS-X) 80 MG chewable tablet Chew 1 tablet (80 mg total) by mouth every 6 (six) hours as needed for flatulence. 02/13/19  Yes Aviva Signs, CNM  dicyclomine (BENTYL) 20 MG tablet Take 1 tablet (20 mg total) by mouth 2 (two) times daily. 11/18/18   Garlon Hatchet, PA-C  hydrOXYzine (ATARAX/VISTARIL) 25 MG tablet Take 1 tablet (25 mg total) by mouth every 6 (six) hours as needed (anxiety/agitation or CIWA < or = 10). 10/15/18   Aldean Baker, NP    Family History Family History  Problem Relation Age of Onset  . Bipolar disorder Mother   . Cancer Father   . ADD / ADHD Brother   . Cancer Maternal Grandmother   . Cancer Maternal Grandfather   . Cancer Paternal Grandmother   . Cancer Paternal Grandfather     Social History Social History    Tobacco Use  . Smoking status: Current Every Day Smoker    Packs/day: 0.50    Years: 5.00    Pack years: 2.50    Types: Cigarettes  . Smokeless tobacco: Never Used  Substance Use Topics  . Alcohol use: No  . Drug use: Not Currently    Types: Marijuana    Comment: last used 3wks ago      Allergies   Azithromycin; Doxycycline; Prednisone; Bactrim [sulfamethoxazole-trimethoprim]; Flagyl [metronidazole]; Toradol [ketorolac tromethamine]; and Morphine and related   Review of Systems Review of Systems  Gastrointestinal: Positive for abdominal pain.  Ten systems reviewed and are negative for acute change, except as noted in the HPI.    Physical Exam Updated Vital Signs BP 103/70   Pulse 86   Temp 98.4 F (36.9 C) (Oral)   Resp 19   Wt 55.9 kg   LMP 12/01/2018   SpO2 98%   BMI 21.17 kg/m   Physical Exam Vitals signs and nursing note reviewed.  Constitutional:      General: She is not in acute distress.    Appearance: She is well-developed. She is not diaphoretic.     Comments: Patient nontoxic-appearing  HENT:     Head: Normocephalic and atraumatic.  Eyes:     General: No scleral icterus.    Conjunctiva/sclera: Conjunctivae normal.  Neck:     Musculoskeletal: Normal range of motion.  Cardiovascular:     Rate and Rhythm: Normal rate and regular rhythm.     Pulses: Normal pulses.  Pulmonary:     Effort: Pulmonary effort is normal. No respiratory distress.     Comments: Respirations even and unlabored Abdominal:     Comments: There is focal right lower quadrant tenderness, though abdominal exam is fairly benign.  No peritoneal signs, palpable masses, distention.  Musculoskeletal: Normal range of motion.  Skin:    General: Skin is warm and dry.     Coloration: Skin is not pale.     Findings: No erythema or rash.  Neurological:     General: No focal deficit present.     Mental Status: She is alert and oriented to person, place, and time.     Coordination:  Coordination normal.     Comments: GCS 15.  Moving extremities spontaneously.  Psychiatric:        Behavior: Behavior normal.      ED Treatments / Results  Labs (all labs  ordered are listed, but only abnormal results are displayed) Labs Reviewed  URINALYSIS, ROUTINE W REFLEX MICROSCOPIC - Abnormal; Notable for the following components:      Result Value   Ketones, ur 80 (*)    Protein, ur 30 (*)    Bacteria, UA RARE (*)    All other components within normal limits  CBC - Abnormal; Notable for the following components:   HCT 35.6 (*)    All other components within normal limits  BASIC METABOLIC PANEL - Abnormal; Notable for the following components:   Sodium 134 (*)    CO2 20 (*)    All other components within normal limits    EKG None  Radiology No results found.  Procedures Procedures (including critical care time)  Medications Ordered in ED Medications  HYDROmorphone (DILAUDID) injection 0.5 mg (0.5 mg Intravenous Given 02/20/19 0229)  sodium chloride 0.9 % bolus 1,000 mL (1,000 mLs Intravenous New Bag/Given 02/20/19 0236)  metoCLOPramide (REGLAN) injection 10 mg (10 mg Intravenous Given 02/20/19 0254)     Initial Impression / Assessment and Plan / ED Course  I have reviewed the triage vital signs and the nursing notes.  Pertinent labs & imaging results that were available during my care of the patient were reviewed by me and considered in my medical decision making (see chart for details).        2:16 AM Patient presenting for right lower quadrant abdominal pain.  She was transferred to the ED from MAU.  On chart review, patient has a history of chronic pain.  She has, specifically, been seen for right lower quadrant abdominal pain at least 5 times since February.  She had normal CT scans of her abdomen/pelvis on 11/06/2018, 11/18/2018, 12/12/2018.  Was told that she was pregnant when she presented to [facility] at the end of May.  Underwent MRI of her pelvis on  02/10/2019 with normal results.  No evidence of appendicitis.  Her MRI was subsequently repeated at [facility] on 02/17/2019.  Appendix not visualized on this imaging, but there was suspicion for ovarian cyst with fluid along the right pelvic sidewall favoring cyst rupture.  No other inflammatory changes in the RLQ.  She was discharged on Percocet which ran out yesterday.  Transferred from MAU to ensure no other emergent cause of pain.  While patient indicates tenderness in her right lower quadrant, she has no guarding.  Her abdominal exam is fairly benign.  She has no peritoneal signs.  Will repeat CBC and BMP for trending.  If stable, I see no indication for further emergent work-up or imaging.  Suspect that recent MRI findings are incidental and unrelated to the patient's pain.  She will be given 1 dose of pain medication while she is hydrated with 1 L IV fluids.  Requesting medication for nausea which has been ordered.  2:57 AM No leukocytosis today. BMP reassuring. Plan for d/c after IVF complete.  3:52 AM Patient resting comfortably on repeat assessment.  She has been hydrated with 1 L IV fluids.  Conveyed stable blood testing to which patient verbalizes understanding.  She was requesting a prescription for pain medication at discharge.  I have a high suspicion that her pain today is chronic rather than due to ovarian cyst rupture.  Not only is she pregnant with opiates presenting risk for fetal harm, the patient also has an overdose risk score of 650.  Should she be managed in the future with narcotics, this should be prescribed by  her primary care doctor or her OB/GYN.  She was given 1 tablet of Percocet prior to discharge.  Encouraged to continue use of Tylenol and heating pads.  Patient discharged in stable condition.   Final Clinical Impressions(s) / ED Diagnoses   Final diagnoses:  Right lower quadrant abdominal pain    ED Discharge Orders    None       Antony MaduraHumes, Zaylee Cornia, PA-C  02/20/19 0353    Zadie RhineWickline, Donald, MD 02/20/19 [phone]

## 2019-02-20 NOTE — ED Triage Notes (Signed)
Pt present to ED from MAU. C/o abdominal pain. Pt says she was told a few days ago she had a cysts rupture and to come back if her pain worsened. She went to MAU and was sent here d/t severe pain.

## 2019-03-27 ENCOUNTER — Encounter (HOSPITAL_COMMUNITY): Payer: Self-pay

## 2019-03-27 ENCOUNTER — Other Ambulatory Visit: Payer: Self-pay

## 2019-03-27 DIAGNOSIS — F17210 Nicotine dependence, cigarettes, uncomplicated: Secondary | ICD-10-CM | POA: Diagnosis not present

## 2019-03-27 DIAGNOSIS — N10 Acute tubulo-interstitial nephritis: Secondary | ICD-10-CM | POA: Diagnosis not present

## 2019-03-27 DIAGNOSIS — Z20828 Contact with and (suspected) exposure to other viral communicable diseases: Secondary | ICD-10-CM | POA: Diagnosis not present

## 2019-03-27 DIAGNOSIS — M545 Low back pain: Secondary | ICD-10-CM | POA: Diagnosis present

## 2019-03-27 LAB — URINALYSIS, ROUTINE W REFLEX MICROSCOPIC
Bilirubin Urine: NEGATIVE
Glucose, UA: NEGATIVE mg/dL
Ketones, ur: NEGATIVE mg/dL
Leukocytes,Ua: NEGATIVE
Nitrite: NEGATIVE
Protein, ur: NEGATIVE mg/dL
Specific Gravity, Urine: 1.009 (ref 1.005–1.030)
pH: 7 (ref 5.0–8.0)

## 2019-03-27 NOTE — ED Triage Notes (Signed)
Pt arrived stating she was treated for UTI a few weeks ago and given keflex, states symptoms are worse and now having back pain. She reports some blood in her urine earlier today.

## 2019-03-28 ENCOUNTER — Emergency Department (HOSPITAL_COMMUNITY)
Admission: EM | Admit: 2019-03-28 | Discharge: 2019-03-28 | Disposition: A | Discharge status: Other Acute Inpatient Hospital | Payer: Medicaid Other | Attending: Emergency Medicine | Admitting: Emergency Medicine

## 2019-03-28 ENCOUNTER — Emergency Department (HOSPITAL_COMMUNITY): Payer: Medicaid Other

## 2019-03-28 DIAGNOSIS — N12 Tubulo-interstitial nephritis, not specified as acute or chronic: Secondary | ICD-10-CM

## 2019-03-28 LAB — RAPID URINE DRUG SCREEN, HOSP PERFORMED
Amphetamines: NOT DETECTED
Barbiturates: NOT DETECTED
Benzodiazepines: NOT DETECTED
Cocaine: NOT DETECTED
Opiates: NOT DETECTED
Tetrahydrocannabinol: NOT DETECTED

## 2019-03-28 LAB — COMPREHENSIVE METABOLIC PANEL
ALT: 12 U/L (ref 0–44)
AST: 17 U/L (ref 15–41)
Albumin: 4.5 g/dL (ref 3.5–5.0)
Alkaline Phosphatase: 74 U/L (ref 38–126)
Anion gap: 10 (ref 5–15)
BUN: 5 mg/dL — ABNORMAL LOW (ref 6–20)
CO2: 22 mmol/L (ref 22–32)
Calcium: 9.2 mg/dL (ref 8.9–10.3)
Chloride: 101 mmol/L (ref 98–111)
Creatinine, Ser: 0.37 mg/dL — ABNORMAL LOW (ref 0.44–1.00)
GFR calc Af Amer: >60 mL/min (ref >60)
GFR calc non Af Amer: >60 mL/min (ref >60)
Glucose, Bld: 77 mg/dL (ref 70–99)
Potassium: 3.3 mmol/L — ABNORMAL LOW (ref 3.5–5.1)
Sodium: 133 mmol/L — ABNORMAL LOW (ref 135–145)
Total Bilirubin: 1 mg/dL (ref 0.3–1.2)
Total Protein: 8.7 g/dL — ABNORMAL HIGH (ref 6.5–8.1)

## 2019-03-28 LAB — CBC WITH DIFFERENTIAL/PLATELET
Abs Immature Granulocytes: 0.03 10*3/uL (ref 0.00–0.07)
Basophils Absolute: 0.1 10*3/uL (ref 0.0–0.1)
Basophils Relative: 1 %
Eosinophils Absolute: 0.1 10*3/uL (ref 0.0–0.5)
Eosinophils Relative: 1 %
HCT: 43.2 % (ref 36.0–46.0)
Hemoglobin: 14.3 g/dL (ref 12.0–15.0)
Immature Granulocytes: 0 %
Lymphocytes Relative: 27 %
Lymphs Abs: 2.8 10*3/uL (ref 0.7–4.0)
MCH: 31.4 pg (ref 26.0–34.0)
MCHC: 33.1 g/dL (ref 30.0–36.0)
MCV: 94.9 fL (ref 80.0–100.0)
Monocytes Absolute: 0.5 10*3/uL (ref 0.1–1.0)
Monocytes Relative: 5 %
Neutro Abs: 6.7 10*3/uL (ref 1.7–7.7)
Neutrophils Relative %: 66 %
Platelets: 325 10*3/uL (ref 150–400)
RBC: 4.55 MIL/uL (ref 3.87–5.11)
RDW: 13.2 % (ref 11.5–15.5)
WBC: 10.2 10*3/uL (ref 4.0–10.5)
nRBC: 0 % (ref 0.0–0.2)

## 2019-03-28 LAB — SARS CORONAVIRUS 2 BY RT PCR (HOSPITAL ORDER, PERFORMED IN ~~LOC~~ HOSPITAL LAB): SARS Coronavirus 2: NEGATIVE

## 2019-03-28 MED ORDER — MORPHINE SULFATE (PF) 4 MG/ML IV SOLN
4.0000 mg | Freq: Once | INTRAVENOUS | Status: AC
  Administered 2019-03-28: 4 mg via INTRAVENOUS
  Filled 2019-03-28: qty 1

## 2019-03-28 MED ORDER — HYDROMORPHONE HCL 1 MG/ML IJ SOLN
0.5000 mg | Freq: Once | INTRAMUSCULAR | Status: AC
  Administered 2019-03-28: 0.5 mg via INTRAVENOUS
  Filled 2019-03-28: qty 1

## 2019-03-28 MED ORDER — SODIUM CHLORIDE 0.9 % IV SOLN
2.0000 g | Freq: Once | INTRAVENOUS | Status: AC
  Administered 2019-03-28: 2 g via INTRAVENOUS
  Filled 2019-03-28: qty 20

## 2019-03-28 MED ORDER — SODIUM CHLORIDE 0.9 % IV SOLN
Freq: Once | INTRAVENOUS | Status: AC
  Administered 2019-03-28: 06:00:00 via INTRAVENOUS

## 2019-03-28 MED ORDER — SODIUM CHLORIDE 0.9 % IV BOLUS
1000.0000 mL | Freq: Once | INTRAVENOUS | Status: AC
  Administered 2019-03-28: 05:00:00 1000 mL via INTRAVENOUS

## 2019-03-28 MED ORDER — ONDANSETRON HCL 4 MG/2ML IJ SOLN
4.0000 mg | Freq: Once | INTRAMUSCULAR | Status: AC
  Administered 2019-03-28: 4 mg via INTRAVENOUS
  Filled 2019-03-28: qty 2

## 2019-03-28 MED ORDER — SODIUM CHLORIDE 0.9 % IV BOLUS
1000.0000 mL | Freq: Once | INTRAVENOUS | Status: AC
  Administered 2019-03-28: 03:00:00 1000 mL via INTRAVENOUS

## 2019-03-28 NOTE — ED Provider Notes (Addendum)
[facility] Provider Note   CSN: [id number] Arrival date & time: 03/27/19  2206    History   Chief Complaint Chief Complaint  Patient presents with   Urinary Tract Infection    HPI Tonya Davidson is a 28 y.o. female.     HPI   Tonya ApleyLindsay M Wambold is a 28 y.o. female, with a history of anemia, bipolar, STDs, fibromyalgia, polysubstance abuse, presenting to the ED with left back pain beginning 7/9, constant, 9/10, stabbing, shooting pain into suprapubic region with urination.  She states pain is similar to previous kidney stone.  Accompanied by nausea and vomiting and fever of 101.3 F for the last 2 days.  Scant hematuria beginning in the last 24 hours. States she is [redacted] weeks pregnant, G1P0.   States she was seen at [facility] ED on June 24 for abdominal cramping and dysuria, was diagnosed with UTI, and prescribed Keflex 500 mg twice daily for 7 days.  She also underwent pelvic ultrasound which confirmed viable IUP estimated gestation of 8 weeks.  Last tylenol was around midnight this morning. OBGYN: [facility]. Ludwig [facility]ander [facility] [address] [place].  Denies hematemesis, vaginal bleeding, abnormal vaginal discharge, diarrhea, hematochezia/melena, syncope, or any other complaints.    Past Medical History:  Diagnosis Date   Abnormal Pap smear    colposcopy   Anemia    Anxiety    Arthritis    Bipolar 1 disorder (HCC)    C. difficile diarrhea    Chlamydia 04/29/2012   Chronic pain    Depression    Fibromyalgia    HPV in female    IBS (irritable bowel syndrome)    Interstitial cystitis    Kidney stones    Mononucleosis    Ovarian cyst 01/2019   right    Pelvic pain in female 12/14/2014   PID (acute pelvic inflammatory disease) 04/28/2012   Ulcer     Patient Active Problem List   Diagnosis Date Noted   Pelvic pain affecting pregnancy 02/13/2019   Diarrhea 02/13/2019   Elevated bilirubin  02/12/2019   Polysubstance abuse (HCC) 10/13/2018   Major depressive disorder, recurrent severe without psychotic features (HCC) 10/13/2018   Pyelonephritis 09/15/2016   Right flank pain    Pelvic pain in female 12/14/2014   Severe episode of recurrent major depressive disorder (HCC) 10/03/2013   GAD (generalized anxiety disorder) 09/30/2013   PID (acute pelvic inflammatory disease) 04/28/2012   ANXIETY 05/18/2009   IBS 05/18/2009    Past Surgical History:  Procedure Laterality Date   COLPOSCOPY     INNER EAR SURGERY     KIDNEY STONE SURGERY     LAPAROSCOPIC CHOLECYSTECTOMY     TONSILLECTOMY     WISDOM TOOTH EXTRACTION       OB History    Gravida  1   Para      Term      Preterm      AB      Living        SAB      TAB      Ectopic      Multiple      Live Births               Home Medications    Prior to Admission medications   Medication Sig Start Date End Date Taking? Authorizing Provider  clonazePAM (KLONOPIN) 0.5 MG tablet Take 1 tablet (0.5 mg total) by mouth 2 (two) times  daily as needed (anxiety or insomnia). 10/15/18  Yes Aldean BakerSykes, Janet E, NP  Doxylamine-Pyridoxine 10-10 MG TBEC Take 2 tablets by mouth at bedtime as needed (for nausea). 02/20/19  Yes Antony MaduraHumes, Kelly, PA-C  Pediatric Multiple Vit-C-FA (FLINSTONES GUMMIES OMEGA-3 DHA) CHEW Chew 2 tablets by mouth daily.   Yes [provider]  acetaminophen (TYLENOL) 325 MG tablet Take 2 tablets (650 mg total) by mouth every 6 (six) hours as needed for mild pain or moderate pain. (May buy over the counter) Patient not taking: Reported on 03/28/2019 10/15/18   Aldean BakerSykes, Janet E, NP  dicyclomine (BENTYL) 20 MG tablet Take 1 tablet (20 mg total) by mouth 2 (two) times daily. Patient not taking: Reported on 03/28/2019 11/18/18   Garlon HatchetSanders, Lisa M, PA-C  hydrOXYzine (ATARAX/VISTARIL) 25 MG tablet Take 1 tablet (25 mg total) by mouth every 6 (six) hours as needed (anxiety/agitation or CIWA < or =  10). Patient not taking: Reported on 03/28/2019 10/15/18   Aldean BakerSykes, Janet E, NP  ondansetron (ZOFRAN ODT) 4 MG disintegrating tablet Take 1 tablet (4 mg total) by mouth every 8 (eight) hours as needed for nausea. Patient not taking: Reported on 03/28/2019 11/18/18   Garlon HatchetSanders, Lisa M, PA-C  simethicone (GAS-X) 80 MG chewable tablet Chew 1 tablet (80 mg total) by mouth every 6 (six) hours as needed for flatulence. Patient not taking: Reported on 03/28/2019 02/13/19   Aviva SignsWilliams, Marie L, CNM    Family History Family History  Problem Relation Age of Onset   Bipolar disorder Mother    Cancer Father    ADD / ADHD Brother    Cancer Maternal Grandmother    Cancer Maternal Grandfather    Cancer Paternal Grandmother    Cancer Paternal Grandfather     Social History Social History   Tobacco Use   Smoking status: Current Every Day Smoker    Packs/day: 0.50    Years: 5.00    Pack years: 2.50    Types: Cigarettes   Smokeless tobacco: Never Used  Substance Use Topics   Alcohol use: No   Drug use: Not Currently    Types: Marijuana    Comment: last used 3wks ago      Allergies   Azithromycin, Doxycycline, Prednisone, Bactrim [sulfamethoxazole-trimethoprim], Flagyl [metronidazole], Toradol [ketorolac tromethamine], and Morphine and related   Review of Systems Review of Systems  Constitutional: Positive for fever.  Respiratory: Negative for shortness of breath.   Cardiovascular: Negative for chest pain.  Gastrointestinal: Positive for abdominal pain, nausea and vomiting.  Genitourinary: Positive for dysuria and hematuria. Negative for vaginal bleeding and vaginal discharge.  Musculoskeletal: Positive for back pain.  Neurological: Negative for syncope.  All other systems reviewed and are negative.    Physical Exam Updated Vital Signs BP 118/76 (BP Location: Left Arm)    Pulse 72    Temp 98.8 F (37.1 C) (Oral)    Resp 18    LMP 12/01/2018    SpO2 100%   Physical Exam Vitals  signs and nursing note reviewed.  Constitutional:      General: She is in acute distress (pain).     Appearance: She is well-developed. She is not diaphoretic.  HENT:     Head: Normocephalic and atraumatic.     Mouth/Throat:     Mouth: Mucous membranes are moist.     Pharynx: Oropharynx is clear.  Eyes:     Conjunctiva/sclera: Conjunctivae normal.  Neck:     Musculoskeletal: Neck supple.  Cardiovascular:  Rate and Rhythm: Normal rate and regular rhythm.     Pulses: Normal pulses.          Radial pulses are 2+ on the right side and 2+ on the left side.       Posterior tibial pulses are 2+ on the right side and 2+ on the left side.     Heart sounds: Normal heart sounds.     Comments: Tactile temperature in the extremities appropriate and equal bilaterally. Pulmonary:     Effort: Pulmonary effort is normal. No respiratory distress.     Breath sounds: Normal breath sounds.  Abdominal:     Palpations: Abdomen is soft.     Tenderness: There is abdominal tenderness in the suprapubic area. There is left CVA tenderness. There is no guarding.  Musculoskeletal:     Right lower leg: No edema.     Left lower leg: No edema.  Lymphadenopathy:     Cervical: No cervical adenopathy.  Skin:    General: Skin is warm and dry.  Neurological:     Mental Status: She is alert.  Psychiatric:        Mood and Affect: Mood and affect normal.        Speech: Speech normal.        Behavior: Behavior normal.      ED Treatments / Results  Labs (all labs ordered are listed, but only abnormal results are displayed) Labs Reviewed  URINALYSIS, ROUTINE W REFLEX MICROSCOPIC - Abnormal; Notable for the following components:      Result Value   APPearance CLOUDY (*)    Hgb urine dipstick MODERATE (*)    Bacteria, UA RARE (*)    All other components within normal limits  COMPREHENSIVE METABOLIC PANEL - Abnormal; Notable for the following components:   Sodium 133 (*)    Potassium 3.3 (*)    BUN 5 (*)     Creatinine, Ser 0.37 (*)    Total Protein 8.7 (*)    All other components within normal limits  URINE CULTURE  SARS CORONAVIRUS 2 (HOSPITAL ORDER, [facility])  CBC WITH DIFFERENTIAL/PLATELET  RAPID URINE DRUG SCREEN, HOSP PERFORMED    EKG None  Radiology US Renal  Result Date: 03/28/2019 CLINICAL DATA:  Pain EXAM: RENAL / URINARY TRACT ULTRASOUND COMPLETE COMPARISON:  CT dated November 18, 2018 FINDINGS: Right Kidney: Renal measurements: 10.5 x 5.2 x 4 cm = volume: 115 mL . Echogenicity within normal limits. No mass or hydronephrosis visualized. Left Kidney: Renal measurements: 10 x 4.4 x 4.7 cm = volume: 106 mL. Echogenicity within normal limits. No mass or hydronephrosis visualized. Bladder: Appears normal for degree of bladder distention. IMPRESSION: No acute sonographic abnormality detected. Electronically Signed   By: Constance Holster M.D.   On: 03/28/2019 03:31    Procedures Procedures (including critical care time)  Medications Ordered in ED Medications  sodium chloride 0.9 % bolus 1,000 mL (0 mLs Intravenous Stopped 03/28/19 0359)  ondansetron (ZOFRAN) injection 4 mg (4 mg Intravenous Given 03/28/19 0301)  morphine 4 MG/ML injection 4 mg (4 mg Intravenous Given 03/28/19 0301)  sodium chloride 0.9 % bolus 1,000 mL (0 mLs Intravenous Stopped 03/28/19 0613)  morphine 4 MG/ML injection 4 mg (4 mg Intravenous Given 03/28/19 0437)  cefTRIAXone (ROCEPHIN) 2 g in sodium chloride 0.9 % 100 mL IVPB (2 g Intravenous New Bag/Given 03/28/19 0619)  0.9 %  sodium chloride infusion ( Intravenous New Bag/Given 03/28/19 0618)  HYDROmorphone (DILAUDID) injection  0.5 mg (0.5 mg Intravenous Given 03/28/19 0620)     Initial Impression / Assessment and Plan / ED Course  I have reviewed the triage vital signs and the nursing notes.  Pertinent labs & imaging results that were available during my care of the patient were reviewed by me and considered in my medical decision  making (see chart for details).  Clinical Course as of Mar 27 709  Fri Mar 28, 2019  0520 Patient states she has not had any improvement in her pain or symptoms.    [SJ]  0533 Spoke with Harlan StainsSarah White, MD, OBGYN at [facility]. Requests 2g Ceftriaxone and transfer patient for treatment of pyelonephritis.   [SJ]  [id number] Discussed proposed plan for transfer with patient. Patient agrees with plan. Patient states her pain is still 8/10.   [SJ]    Clinical Course User Index [SJ] Yesica Kemler C, PA-C       Patient presents with back pain as well as nausea, vomiting, and fever. Patient is nontoxic appearing, afebrile, not tachycardic, not tachypneic, not hypotensive (patient's blood pressures consistent with previous values), maintains excellent SPO2 on room air.  In review of her work-up on June 24, her ultrasound showed viable IUP estimated gestation 8 weeks.  Ovaries were normal bilaterally.  UA with moderate bacteria and some trace leukocyte esterase.  Wet prep unremarkable.  GC/chlamydia negative.  Renal ultrasound today unremarkable.  Lab work overall reassuring.  She does have mild hypokalemia.  No leukocytosis.  Due to persistent pain in the setting of pregnancy, [facility].  Transfer and admission recommended.  Findings and plan of care discussed with Dutch Quinthris Pollina, MD.   Vitals:   03/28/19 0430 03/28/19 0500 03/28/19 0530 03/28/19 0600  BP: (!) 91/56 96/68 110/67 (!) 106/91  Pulse: 72 71 86 83  Resp:      Temp:      TempSrc:      SpO2: 100% 100% 100% 99%     Final Clinical Impressions(s) / ED Diagnoses   Final diagnoses:  Pyelonephritis    ED Discharge Orders    None       Concepcion LivingJoy, Brondon Wann C, PA-C 03/28/19 0658    Anselm PancoastJoy, Solan Vosler C, PA-C 03/28/19 0710    Gilda CreasePollina, Christopher J, MD 03/28/19 [phone]

## 2019-03-28 NOTE — ED Notes (Signed)
Called transport for pt. Going to [facility].

## 2019-03-28 NOTE — ED Notes (Signed)
Called lab and had urine culture and drug screen added on to prior urine.

## 2019-03-28 NOTE — ED Notes (Signed)
[place] transport present to transport patient to accepting facility. Patient updated on POC. VSS. EMTALA completed. Patient belongings transported with patient.

## 2019-03-29 LAB — URINE CULTURE: Culture: <10000 — AB

## 2019-03-31 ENCOUNTER — Other Ambulatory Visit: Payer: Self-pay

## 2019-03-31 DIAGNOSIS — F17210 Nicotine dependence, cigarettes, uncomplicated: Secondary | ICD-10-CM | POA: Insufficient documentation

## 2019-03-31 DIAGNOSIS — F1210 Cannabis abuse, uncomplicated: Secondary | ICD-10-CM | POA: Diagnosis not present

## 2019-03-31 DIAGNOSIS — R1031 Right lower quadrant pain: Secondary | ICD-10-CM | POA: Insufficient documentation

## 2019-03-31 DIAGNOSIS — G8929 Other chronic pain: Secondary | ICD-10-CM | POA: Diagnosis not present

## 2019-03-31 DIAGNOSIS — F1310 Sedative, hypnotic or anxiolytic abuse, uncomplicated: Secondary | ICD-10-CM | POA: Insufficient documentation

## 2019-03-31 DIAGNOSIS — Z3A11 11 weeks gestation of pregnancy: Secondary | ICD-10-CM | POA: Insufficient documentation

## 2019-03-31 DIAGNOSIS — Z765 Malingerer [conscious simulation]: Secondary | ICD-10-CM | POA: Diagnosis not present

## 2019-03-31 DIAGNOSIS — O26891 Other specified pregnancy related conditions, first trimester: Secondary | ICD-10-CM | POA: Diagnosis not present

## 2019-03-31 DIAGNOSIS — Z79899 Other long term (current) drug therapy: Secondary | ICD-10-CM | POA: Insufficient documentation

## 2019-03-31 DIAGNOSIS — O99321 Drug use complicating pregnancy, first trimester: Secondary | ICD-10-CM | POA: Insufficient documentation

## 2019-03-31 MED ORDER — GENERIC EXTERNAL MEDICATION
10.00 | Status: DC
Start: 2019-03-30 — End: 2019-03-31

## 2019-03-31 MED ORDER — ZOLPIDEM TARTRATE 5 MG PO TABS
5.00 | ORAL_TABLET | ORAL | Status: DC
Start: ? — End: 2019-03-31

## 2019-03-31 MED ORDER — ACETAMINOPHEN 325 MG PO TABS
650.00 | ORAL_TABLET | ORAL | Status: DC
Start: 2019-03-30 — End: 2019-03-31

## 2019-03-31 MED ORDER — CEPHALEXIN 500 MG PO CAPS
500.00 | ORAL_CAPSULE | ORAL | Status: DC
Start: 2019-03-30 — End: 2019-03-31

## 2019-03-31 MED ORDER — HYDROXYZINE HCL 25 MG PO TABS
25.00 | ORAL_TABLET | ORAL | Status: DC
Start: 2019-03-30 — End: 2019-03-31

## 2019-03-31 MED ORDER — CLONAZEPAM 0.5 MG PO TABS
0.50 | ORAL_TABLET | ORAL | Status: DC
Start: 2019-03-30 — End: 2019-03-31

## 2019-03-31 MED ORDER — OXYCODONE HCL 5 MG PO TABS
10.00 | ORAL_TABLET | ORAL | Status: DC
Start: ? — End: 2019-03-31

## 2019-03-31 MED ORDER — GENERIC EXTERNAL MEDICATION
10.00 | Status: DC
Start: ? — End: 2019-03-31

## 2019-03-31 MED ORDER — LACTATED RINGERS IV SOLN
500.00 | INTRAVENOUS | Status: DC
Start: ? — End: 2019-03-31

## 2019-04-01 ENCOUNTER — Other Ambulatory Visit: Payer: Self-pay

## 2019-04-01 ENCOUNTER — Encounter (HOSPITAL_COMMUNITY): Payer: Self-pay | Admitting: Emergency Medicine

## 2019-04-01 ENCOUNTER — Emergency Department (HOSPITAL_COMMUNITY): Payer: Medicaid Other

## 2019-04-01 ENCOUNTER — Emergency Department (HOSPITAL_COMMUNITY)
Admission: EM | Admit: 2019-04-01 | Discharge: 2019-04-01 | Disposition: A | Discharge status: Home / Self Care | Payer: Medicaid Other | Attending: Emergency Medicine | Admitting: Emergency Medicine

## 2019-04-01 DIAGNOSIS — G8929 Other chronic pain: Secondary | ICD-10-CM

## 2019-04-01 DIAGNOSIS — Z765 Malingerer [conscious simulation]: Secondary | ICD-10-CM

## 2019-04-01 DIAGNOSIS — R52 Pain, unspecified: Secondary | ICD-10-CM

## 2019-04-01 HISTORY — DX: Malingerer (conscious simulation): Z76.5

## 2019-04-01 LAB — CBC WITH DIFFERENTIAL/PLATELET
Abs Immature Granulocytes: 0.02 10*3/uL (ref 0.00–0.07)
Basophils Absolute: 0 10*3/uL (ref 0.0–0.1)
Basophils Relative: 1 %
Eosinophils Absolute: 0.1 10*3/uL (ref 0.0–0.5)
Eosinophils Relative: 1 %
HCT: 37.8 % (ref 36.0–46.0)
Hemoglobin: 12.3 g/dL (ref 12.0–15.0)
Immature Granulocytes: 0 %
Lymphocytes Relative: 27 %
Lymphs Abs: 1.8 10*3/uL (ref 0.7–4.0)
MCH: 31.3 pg (ref 26.0–34.0)
MCHC: 32.5 g/dL (ref 30.0–36.0)
MCV: 96.2 fL (ref 80.0–100.0)
Monocytes Absolute: 0.3 10*3/uL (ref 0.1–1.0)
Monocytes Relative: 5 %
Neutro Abs: 4.3 10*3/uL (ref 1.7–7.7)
Neutrophils Relative %: 66 %
Platelets: 259 10*3/uL (ref 150–400)
RBC: 3.93 MIL/uL (ref 3.87–5.11)
RDW: 13 % (ref 11.5–15.5)
WBC: 6.5 10*3/uL (ref 4.0–10.5)
nRBC: 0 % (ref 0.0–0.2)

## 2019-04-01 LAB — URINALYSIS, ROUTINE W REFLEX MICROSCOPIC
Bilirubin Urine: NEGATIVE
Glucose, UA: NEGATIVE mg/dL
Ketones, ur: NEGATIVE mg/dL
Nitrite: NEGATIVE
Protein, ur: NEGATIVE mg/dL
Specific Gravity, Urine: 1.011 (ref 1.005–1.030)
pH: 8 (ref 5.0–8.0)

## 2019-04-01 LAB — BASIC METABOLIC PANEL
Anion gap: 9 (ref 5–15)
BUN: 7 mg/dL (ref 6–20)
CO2: 19 mmol/L — ABNORMAL LOW (ref 22–32)
Calcium: 8.8 mg/dL — ABNORMAL LOW (ref 8.9–10.3)
Chloride: 107 mmol/L (ref 98–111)
Creatinine, Ser: 0.49 mg/dL (ref 0.44–1.00)
GFR calc Af Amer: >60 mL/min (ref >60)
GFR calc non Af Amer: >60 mL/min (ref >60)
Glucose, Bld: 129 mg/dL — ABNORMAL HIGH (ref 70–99)
Potassium: 3.4 mmol/L — ABNORMAL LOW (ref 3.5–5.1)
Sodium: 135 mmol/L (ref 135–145)

## 2019-04-01 LAB — RAPID URINE DRUG SCREEN, HOSP PERFORMED
Amphetamines: NOT DETECTED
Barbiturates: NOT DETECTED
Benzodiazepines: NOT DETECTED
Cocaine: NOT DETECTED
Opiates: NOT DETECTED
Tetrahydrocannabinol: NOT DETECTED

## 2019-04-01 MED ORDER — ACETAMINOPHEN 500 MG PO TABS
1000.0000 mg | ORAL_TABLET | Freq: Once | ORAL | Status: DC
  Filled 2019-04-01: qty 2

## 2019-04-01 MED ORDER — CEPHALEXIN 500 MG PO CAPS: ORAL_CAPSULE | ORAL | 0 refills | Status: AC

## 2019-04-01 NOTE — ED Notes (Addendum)
RN and MD at bedside explaining discharge instructions to pt. Pt raising voice at staff and stating this "this is ridiculous. You didn't even address my stomach problems. This has nothing to do with the baby. This is my stomach." MD explained multiple times the labs and results and follow up care. MD went into details on various medications that would help and be safe for the baby. Pt upset at staff and yelling "so you mean to tell my I wasted 12 fucking hours just for you to not even address the stomach pain." RN went over paperwork and pt interrupted stating "dont even bother, I have taken Keflex 3 times and it never works." Rn again attempted to finish going over the paperwork and pt stated "I dont care to hear the rest. I am not getting that filled anyway."   Pt was ambulatory and alert. No iv

## 2019-04-01 NOTE — ED Notes (Addendum)
Patient states "what the fuck is this Tylenol going to do for me? I have been taking Tylenol all day and it hasn't helped. If she isn't going to do anything for my pain, then [facility] need [facility]." EDP made aware of refusal.

## 2019-04-01 NOTE — ED Notes (Signed)
[place] at bedside. Pt stated "you do realize this is going to hurt and make this worse right"

## 2019-04-01 NOTE — ED Provider Notes (Signed)
[facility]-EMERGENCY DEPT Provider Note   CSN: [id number] Arrival date & time: 03/31/19  2243     History   Chief Complaint Chief Complaint  Patient presents with   Abdominal Pain    HPI Tonya Davidson is a 28 y.o. female.     The history is provided by the patient.  Abdominal Pain Pain location:  RLQ (pelvic and back) Pain quality: aching   Pain radiates to:  Does not radiate Pain severity:  Severe Onset quality:  Gradual Timing:  Constant Progression:  Unchanged Chronicity:  Chronic Context: not alcohol use   Relieved by:  Nothing Worsened by:  Nothing Ineffective treatments:  None tried Associated symptoms: no anorexia, no chest pain, no chills, no constipation, no cough, no dysuria, no fever, no flatus, no nausea, no shortness of breath, no vaginal bleeding and no vomiting   Risk factors: pregnancy   Patient who is G1P0 at 11 weeks and 6 days based on recent ultrasound with chronic pain/ narcotic abuse and h/o drug seeking who presents with ongoing abdominal pain that she states was not managed during her admission at [facility] for presumed pyelonephritis.  She states she was not sent home on antibiotics.  She denies n/v/d.  No f/c/r.  But has ongoing B "kidney pain and groin pain.  No trauma.  No dysuria.  No vaginal bleeding.    Past Medical History:  Diagnosis Date   Abnormal Pap smear    colposcopy   Anemia    Anxiety    Arthritis    Bipolar 1 disorder (HCC)    C. 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   Pyelonephritis 09/15/2016   Right flank pain    Pelvic pain in female 12/14/2014   Severe episode of recurrent major depressive disorder (HCC) 10/03/2013   GAD (generalized anxiety disorder) 09/30/2013   PID (acute pelvic inflammatory disease) 04/28/2012   ANXIETY 05/18/2009   IBS 05/18/2009    Past Surgical History:  Procedure Laterality Date   COLPOSCOPY     INNER EAR SURGERY     KIDNEY STONE SURGERY     LAPAROSCOPIC CHOLECYSTECTOMY     TONSILLECTOMY     WISDOM TOOTH EXTRACTION       OB History    Gravida  1   Para      Term      Preterm      AB      Living        SAB      TAB      Ectopic      Multiple      Live Births               Home Medications    Prior to Admission medications   Medication Sig Start Date End Date  Taking? Authorizing Provider  clonazePAM (KLONOPIN) 0.5 MG tablet Take 1 tablet (0.5 mg total) by mouth 2 (two) times daily as needed (anxiety or insomnia). 10/15/18  Yes Aldean BakerSykes, Janet E, NP  Doxylamine-Pyridoxine 10-10 MG TBEC Take 2 tablets by mouth at bedtime as needed (for nausea). 02/20/19  Yes Antony MaduraHumes, Kelly, PA-C  Pediatric Multiple Vit-C-FA (FLINSTONES GUMMIES OMEGA-3 DHA) CHEW Chew 2 tablets by mouth daily.   Yes [provider]  acetaminophen (TYLENOL) 325 MG tablet Take 2 tablets (650 mg total) by mouth every 6 (six) hours as needed for mild pain or moderate pain. (May buy over the counter) Patient not taking: Reported on 03/28/2019 10/15/18   Aldean BakerSykes, Janet E, NP  cephALEXin [facility](KEFLEX) 500 MG capsule 1 cap po bid x 14 days 04/01/19   Anothony Bursch, MD  dicyclomine (BENTYL) 20 MG tablet Take 1 tablet (20 mg total) by mouth 2 (two) times daily. Patient not taking: Reported on 03/28/2019 11/18/18   Garlon HatchetSanders, Lisa M, PA-C  hydrOXYzine (ATARAX/VISTARIL) 25  MG tablet Take 1 tablet (25 mg total) by mouth every 6 (six) hours as needed (anxiety/agitation or CIWA < or = 10). Patient not taking: Reported on 03/28/2019 10/15/18   Aldean BakerSykes, Janet E, NP  ondansetron (ZOFRAN ODT) 4 MG disintegrating tablet Take 1 tablet (4 mg total) by mouth every 8 (eight) hours as needed for nausea. Patient not taking: Reported on 03/28/2019 11/18/18   Garlon HatchetSanders, Lisa M, PA-C  simethicone (GAS-X) 80 MG chewable tablet Chew 1 tablet (80 mg total) by mouth every 6 (six) hours as needed for flatulence. Patient not taking: Reported on 03/28/2019 02/13/19   Aviva SignsWilliams, Marie L, CNM    Family History Family History  Problem Relation Age of Onset   Bipolar disorder Mother    Cancer Father    ADD / ADHD Brother    Cancer Maternal Grandmother    Cancer Maternal Grandfather    Cancer Paternal Grandmother    Cancer Paternal Grandfather     Social History Social History   Tobacco Use   Smoking status: Current Every Day Smoker    Packs/day: 0.50    Years: 5.00    Pack years: 2.50    Types: Cigarettes   Smokeless tobacco: Never Used  Substance Use Topics   Alcohol use: No   Drug use: Not Currently    Types: Marijuana    Comment: last used 3wks ago      Allergies   Azithromycin, Doxycycline, Prednisone, Bactrim [sulfamethoxazole-trimethoprim], Flagyl [metronidazole], Toradol [ketorolac tromethamine], and Morphine and related   Review of Systems Review of Systems  Constitutional: Negative for activity change, appetite change, chills and fever.  Respiratory: Negative for cough and shortness of breath.   Cardiovascular: Negative for chest pain.  Gastrointestinal: Positive for abdominal pain. Negative for anal bleeding, anorexia, blood in stool, constipation, flatus, nausea and vomiting.  Genitourinary: Positive for pelvic pain. Negative for difficulty urinating, dysuria, flank pain, frequency, genital sores and vaginal bleeding.  All other systems reviewed and are  negative.    Physical Exam Updated Vital Signs BP 108/76 (BP Location: Left Arm)    Pulse (!) 120    Temp 98.6 F (37 C) (Oral)    Resp 16    Ht 5\' 4"  (1.626 m)    Wt 54.7 kg    LMP 12/01/2018    SpO2 99%    BMI 20.72 kg/m   Physical Exam Vitals signs and nursing note reviewed.  Constitutional:      General: She  is not in acute distress.    Appearance: She is normal weight.     Comments: Appears under the influence of substances  HENT:     Head: Normocephalic and atraumatic.     Mouth/Throat:     Mouth: Mucous membranes are moist.  Eyes:     Extraocular Movements: Extraocular movements intact.     Pupils: Pupils are equal, round, and reactive to light.  Cardiovascular:     Rate and Rhythm: Normal rate and regular rhythm.     Heart sounds: Normal heart sounds.  Pulmonary:     Effort: Pulmonary effort is normal.     Breath sounds: Normal breath sounds.  Abdominal:     General: Abdomen is flat and scaphoid.     Palpations: There is no shifting dullness, hepatomegaly or mass.     Tenderness: There is no abdominal tenderness. There is no guarding or rebound. Negative signs include Murphy's sign, Rovsing's sign and McBurney's sign.     Comments: Uterus is just within pelvis  Musculoskeletal: Normal range of motion.  Skin:    General: Skin is warm and dry.     Capillary Refill: Capillary refill takes less than 2 seconds.  Neurological:     General: No focal deficit present.     Mental Status: She is alert and oriented to person, place, and time.     Deep Tendon Reflexes: Reflexes normal.  Psychiatric:        Mood and Affect: Affect is angry.      ED Treatments / Results  Labs (all labs ordered are listed, but only abnormal results are displayed) Results for orders placed or performed during the hospital encounter of 04/01/19  CBC with Differential/Platelet  Result Value Ref Range   WBC 6.5 4.0 - 10.5 K/uL   RBC 3.93 3.87 - 5.11 MIL/uL   Hemoglobin 12.3 12.0 - 15.0  g/dL   HCT 21.337.8 08.636.0 - 57.846.0 %   MCV 96.2 80.0 - 100.0 fL   MCH 31.3 26.0 - 34.0 pg   MCHC 32.5 30.0 - 36.0 g/dL   RDW 46.913.0 62.911.5 - 52.815.5 %   Platelets 259 150 - 400 K/uL   nRBC 0.0 0.0 - 0.2 %   Neutrophils Relative % 66 %   Neutro Abs 4.3 1.7 - 7.7 K/uL   Lymphocytes Relative 27 %   Lymphs Abs 1.8 0.7 - 4.0 K/uL   Monocytes Relative 5 %   Monocytes Absolute 0.3 0.1 - 1.0 K/uL   Eosinophils Relative 1 %   Eosinophils Absolute 0.1 0.0 - 0.5 K/uL   Basophils Relative 1 %   Basophils Absolute 0.0 0.0 - 0.1 K/uL   Immature Granulocytes 0 %   Abs Immature Granulocytes 0.02 0.00 - 0.07 K/uL  Basic metabolic panel  Result Value Ref Range   Sodium 135 135 - 145 mmol/L   Potassium 3.4 (L) 3.5 - 5.1 mmol/L   Chloride 107 98 - 111 mmol/L   CO2 19 (L) 22 - 32 mmol/L   Glucose, Bld 129 (H) 70 - 99 mg/dL   BUN 7 6 - 20 mg/dL   Creatinine, Ser 4.130.49 0.44 - 1.00 mg/dL   Calcium 8.8 (L) 8.9 - 10.3 mg/dL   GFR calc non Af Amer >60 >60 mL/min   GFR calc Af Amer >60 >60 mL/min   Anion gap 9 5 - 15  Urinalysis, Routine w reflex microscopic  Result Value Ref Range   Color, Urine YELLOW YELLOW   APPearance CLEAR  CLEAR   Specific Gravity, Urine 1.011 1.005 - 1.030   pH 8.0 5.0 - 8.0   Glucose, UA NEGATIVE NEGATIVE mg/dL   Hgb urine dipstick MODERATE (A) NEGATIVE   Bilirubin Urine NEGATIVE NEGATIVE   Ketones, ur NEGATIVE NEGATIVE mg/dL   Protein, ur NEGATIVE NEGATIVE mg/dL   Nitrite NEGATIVE NEGATIVE   Leukocytes,Ua TRACE (A) NEGATIVE   RBC / HPF 11-20 0 - 5 RBC/hpf   WBC, UA 0-5 0 - 5 WBC/hpf   Bacteria, UA RARE (A) NONE SEEN   Squamous Epithelial / LPF 6-10 0 - 5   Mucus PRESENT    Hyaline Casts, UA PRESENT   Rapid urine drug screen (hospital performed)  Result Value Ref Range   Opiates NONE DETECTED NONE DETECTED   Cocaine NONE DETECTED NONE DETECTED   Benzodiazepines NONE DETECTED NONE DETECTED   Amphetamines NONE DETECTED NONE DETECTED   Tetrahydrocannabinol NONE DETECTED NONE  DETECTED   Barbiturates NONE DETECTED NONE DETECTED   US Ob Comp Less 14 Wks  Result Date: 04/01/2019 CLINICAL DATA:  Abdominal pain and first-trimester pregnancy EXAM: OBSTETRIC <14 WK [place] AND TRANSVAGINAL OB US DOPPLER ULTRASOUND OF OVARIES TECHNIQUE: Both transabdominal and transvaginal ultrasound examinations were performed for complete evaluation of the gestation as well as the maternal uterus, adnexal regions, and pelvic cul-de-sac. Transvaginal technique was performed to assess early pregnancy. Color and duplex Doppler ultrasound was utilized to evaluate blood flow to the ovaries. COMPARISON:  None. FINDINGS: Intrauterine gestational sac: Single Embryo:  Visualized. Cardiac Activity: Visualized. Heart Rate: 170 bpm CRL:   52.5 mm   11 w 6 d                  [place] EDC: 10/15/2019 Subchorionic hemorrhage:  None visualized. Maternal uterus/adnexae: Normal sized ovaries with normal arterial and venous Doppler flow on both sides. Pulsed Doppler evaluation of both ovaries demonstrates normal appearing low-resistance arterial and venous waveforms. IMPRESSION: Single living intrauterine pregnancy measuring 11 weeks 6 days. No pathologic finding. Normal ovarian blood flow. Electronically Signed   By: Marnee Spring M.D.   On: 04/01/2019 04:57   US Renal  Result Date: 04/01/2019 CLINICAL DATA:  Pain for 1 day.  History of kidney stone EXAM: RENAL / URINARY TRACT ULTRASOUND COMPLETE COMPARISON:  Abdominal MRI 02/10/2019 FINDINGS: Right Kidney: Renal measurements: 10.1 x 3.9 x 4.2 cm = volume: 87 mL . Echogenicity within normal limits. No mass or hydronephrosis visualized. Left Kidney: Renal measurements: 9.8 x 4.8 x 5.4 cm = volume: 133 mL. Echogenicity within normal limits. No mass or hydronephrosis visualized. Bladder: Appears normal for degree of bladder distention. Gravid uterus described separately IMPRESSION: Normal ultrasound of the kidneys. Electronically Signed   By: Marnee Spring M.D.   On: 04/01/2019  04:50   US Renal  Result Date: 03/28/2019 CLINICAL DATA:  Pain EXAM: RENAL / URINARY TRACT ULTRASOUND COMPLETE COMPARISON:  CT dated November 18, 2018 FINDINGS: Right Kidney: Renal measurements: 10.5 x 5.2 x 4 cm = volume: 115 mL . Echogenicity within normal limits. No mass or hydronephrosis visualized. Left Kidney: Renal measurements: 10 x 4.4 x 4.7 cm = volume: 106 mL. Echogenicity within normal limits. No mass or hydronephrosis visualized. Bladder: Appears normal for degree of bladder distention. IMPRESSION: No acute sonographic abnormality detected. Electronically Signed   By: Katherine Mantle M.D.   On: 03/28/2019 03:31   US Pelvic Doppler (torsion R/o Or Mass Arterial Flow)  Result Date: 04/01/2019 CLINICAL DATA:  Abdominal pain and first-trimester pregnancy  EXAM: OBSTETRIC <14 WK [place] AND TRANSVAGINAL OB US DOPPLER ULTRASOUND OF OVARIES TECHNIQUE: Both transabdominal and transvaginal ultrasound examinations were performed for complete evaluation of the gestation as well as the maternal uterus, adnexal regions, and pelvic cul-de-sac. Transvaginal technique was performed to assess early pregnancy. Color and duplex Doppler ultrasound was utilized to evaluate blood flow to the ovaries. COMPARISON:  None. FINDINGS: Intrauterine gestational sac: Single Embryo:  Visualized. Cardiac Activity: Visualized. Heart Rate: 170 bpm CRL:   52.5 mm   11 w 6 d                  [place] EDC: 10/15/2019 Subchorionic hemorrhage:  None visualized. Maternal uterus/adnexae: Normal sized ovaries with normal arterial and venous Doppler flow on both sides. Pulsed Doppler evaluation of both ovaries demonstrates normal appearing low-resistance arterial and venous waveforms. IMPRESSION: Single living intrauterine pregnancy measuring 11 weeks 6 days. No pathologic finding. Normal ovarian blood flow. Electronically Signed   By: Monte Fantasia M.D.   On: 04/01/2019 04:57    Radiology US Ob Comp Less 14 Wks  Result Date:  04/01/2019 CLINICAL DATA:  Abdominal pain and first-trimester pregnancy EXAM: OBSTETRIC <14 WK [place] AND TRANSVAGINAL OB US DOPPLER ULTRASOUND OF OVARIES TECHNIQUE: Both transabdominal and transvaginal ultrasound examinations were performed for complete evaluation of the gestation as well as the maternal uterus, adnexal regions, and pelvic cul-de-sac. Transvaginal technique was performed to assess early pregnancy. Color and duplex Doppler ultrasound was utilized to evaluate blood flow to the ovaries. COMPARISON:  None. FINDINGS: Intrauterine gestational sac: Single Embryo:  Visualized. Cardiac Activity: Visualized. Heart Rate: 170 bpm CRL:   52.5 mm   11 w 6 d                  [place] EDC: 10/15/2019 Subchorionic hemorrhage:  None visualized. Maternal uterus/adnexae: Normal sized ovaries with normal arterial and venous Doppler flow on both sides. Pulsed Doppler evaluation of both ovaries demonstrates normal appearing low-resistance arterial and venous waveforms. IMPRESSION: Single living intrauterine pregnancy measuring 11 weeks 6 days. No pathologic finding. Normal ovarian blood flow. Electronically Signed   By: Monte Fantasia M.D.   On: 04/01/2019 04:57   US Renal  Result Date: 04/01/2019 CLINICAL DATA:  Pain for 1 day.  History of kidney stone EXAM: RENAL / URINARY TRACT ULTRASOUND COMPLETE COMPARISON:  Abdominal MRI 02/10/2019 FINDINGS: Right Kidney: Renal measurements: 10.1 x 3.9 x 4.2 cm = volume: 87 mL . Echogenicity within normal limits. No mass or hydronephrosis visualized. Left Kidney: Renal measurements: 9.8 x 4.8 x 5.4 cm = volume: 133 mL. Echogenicity within normal limits. No mass or hydronephrosis visualized. Bladder: Appears normal for degree of bladder distention. Gravid uterus described separately IMPRESSION: Normal ultrasound of the kidneys. Electronically Signed   By: Monte Fantasia M.D.   On: 04/01/2019 04:50   US Pelvic Doppler (torsion R/o Or Mass Arterial Flow)  Result Date:  04/01/2019 CLINICAL DATA:  Abdominal pain and first-trimester pregnancy EXAM: OBSTETRIC <14 WK [place] AND TRANSVAGINAL OB US DOPPLER ULTRASOUND OF OVARIES TECHNIQUE: Both transabdominal and transvaginal ultrasound examinations were performed for complete evaluation of the gestation as well as the maternal uterus, adnexal regions, and pelvic cul-de-sac. Transvaginal technique was performed to assess early pregnancy. Color and duplex Doppler ultrasound was utilized to evaluate blood flow to the ovaries. COMPARISON:  None. FINDINGS: Intrauterine gestational sac: Single Embryo:  Visualized. Cardiac Activity: Visualized. Heart Rate: 170 bpm CRL:   52.5 mm   11 w 6 d  US EDC: 10/15/2019 Subchorionic hemorrhage:  None visualized. Maternal uterus/adnexae: Normal sized ovaries with normal arterial and venous Doppler flow on both sides. Pulsed Doppler evaluation of both ovaries demonstrates normal appearing low-resistance arterial and venous waveforms. IMPRESSION: Single living intrauterine pregnancy measuring 11 weeks 6 days. No pathologic finding. Normal ovarian blood flow. Electronically Signed   By: Marnee SpringJonathon  Watts M.D.   On: 04/01/2019 04:57    Procedures Procedures (including critical care time)  Medications Ordered in ED Medications  acetaminophen (TYLENOL) tablet 1,000 mg (1,000 mg Oral Refused 04/01/19 0450)   THe patient appears to be under the influence of some substance.    Patient is stating she thinks she has ongoing appendicitis.  I have reviewed notes from [facility] that she said same in June in their ED and had an MRI which was negative.  She is well known to multiple EDs for chronic pain and drug seeking behavior.  Per notes in care everywhere she told [facility] during her admission that she was taking oxycodone that are not prescribed to her.    Case d/w Dr. Harlan StainsSarah White the admitting OB at [email or web address].  Patient was supposed to be discharged on Keflex.  Dr. Cliffton AstersWhite states psychiatry and pain  management were consulted given patient's opioid addiction and chronic pain but Dr. Cliffton AstersWhite states patient refused to comply with those services.  A note will be sent to Dr. Ludwig ClarksZander regarding ER visit and pain seeking behavior.    See nurses note regarding Tylenol that was offered to her that patient refused.      She now states she needs something for her stomach pain. "Tylenol doesn't do anything for that.  It will tear up my stomach.  I don't have cramps."  EDP explained patient was not getting narcotics and that she can follow up with her OB for ongoing pelvic pain.  We have ruled out stones, torsion and any problems with there fetus. The patient has now changed her story and the pain is in the LUQ. I have explained that I discussed her care with the OB team.  I will not be giving her narcotics.  He exam is benign and reassuring I see no signs of acute surgical abdomen.  I find no emergency conditions at this time and believe this is drug seeking behavior.  No indication for admission or transfer at this time.  Given the bacteria in the urine I will start keflex as discussed with Dr. Cliffton AstersWhite of OB.    Final Clinical Impressions(s) / ED Diagnoses   Final diagnoses:  Pain  Drug-seeking behavior   Return for intractable cough, coughing up blood,fevers >100.4 unrelieved by medication, shortness of breath, intractable vomiting, chest pain, shortness of breath, weakness,numbness, changes in speech, facial asymmetry,abdominal pain, passing out,Inability to tolerate liquids or food, cough, altered mental status or any concerns. No signs of systemic illness or infection. The patient is nontoxic-appearing on exam and vital signs are within normal limits.   I have reviewed the triage vital signs and the nursing notes. Pertinent labs &imaging results that were available during my care of the patient were reviewed by me and considered in my medical decision making (see chart for details).  After  history, exam, and medical workup I feel the patient has been appropriately medically screened and is safe for discharge home. Pertinent diagnoses were discussed with the patient. Patient was given return precautions    ED Discharge Orders         Ordered  cephALEXin (KEFLEX) 500 MG capsule     04/01/19 0518           Tc Kapusta, MD 04/01/19 [id number]

## 2019-04-01 NOTE — ED Triage Notes (Signed)
Pt reports having pain in abdomen at umbilicus and radiation to right lower quadrant. Pt reports being [redacted]weeks pregnant.

## 2019-04-01 NOTE — Discharge Instructions (Addendum)
We have discussed your care with Dr. Dema Severin at [facility] are to be on 14 days of antibiotics. We have given you an RX for this medication.

## 2019-04-05 ENCOUNTER — Encounter (HOSPITAL_COMMUNITY): Payer: Self-pay | Admitting: Emergency Medicine

## 2019-04-05 ENCOUNTER — Emergency Department (HOSPITAL_COMMUNITY)
Admission: EM | Admit: 2019-04-05 | Discharge: 2019-04-05 | Disposition: A | Discharge status: Home / Self Care | Payer: Medicaid Other | Attending: Emergency Medicine | Admitting: Emergency Medicine

## 2019-04-05 ENCOUNTER — Other Ambulatory Visit: Payer: Self-pay

## 2019-04-05 DIAGNOSIS — R109 Unspecified abdominal pain: Secondary | ICD-10-CM

## 2019-04-05 DIAGNOSIS — M545 Low back pain, unspecified: Secondary | ICD-10-CM

## 2019-04-05 DIAGNOSIS — R1032 Left lower quadrant pain: Secondary | ICD-10-CM | POA: Insufficient documentation

## 2019-04-05 DIAGNOSIS — F17210 Nicotine dependence, cigarettes, uncomplicated: Secondary | ICD-10-CM | POA: Diagnosis not present

## 2019-04-05 DIAGNOSIS — R319 Hematuria, unspecified: Secondary | ICD-10-CM

## 2019-04-05 LAB — BASIC METABOLIC PANEL
Anion gap: 12 (ref 5–15)
BUN: 11 mg/dL (ref 6–20)
CO2: 19 mmol/L — ABNORMAL LOW (ref 22–32)
Calcium: 9 mg/dL (ref 8.9–10.3)
Chloride: 105 mmol/L (ref 98–111)
Creatinine, Ser: 0.47 mg/dL (ref 0.44–1.00)
GFR calc Af Amer: >60 mL/min (ref >60)
GFR calc non Af Amer: >60 mL/min (ref >60)
Glucose, Bld: 96 mg/dL (ref 70–99)
Potassium: 3.5 mmol/L (ref 3.5–5.1)
Sodium: 136 mmol/L (ref 135–145)

## 2019-04-05 LAB — URINALYSIS, ROUTINE W REFLEX MICROSCOPIC
Bilirubin Urine: NEGATIVE
Glucose, UA: NEGATIVE mg/dL
Ketones, ur: NEGATIVE mg/dL
Nitrite: NEGATIVE
Protein, ur: NEGATIVE mg/dL
RBC / HPF: >50 RBC/hpf — ABNORMAL HIGH (ref 0–5)
Specific Gravity, Urine: 1.018 (ref 1.005–1.030)
pH: 5 (ref 5.0–8.0)

## 2019-04-05 LAB — CBC
HCT: 34 % — ABNORMAL LOW (ref 36.0–46.0)
Hemoglobin: 11.8 g/dL — ABNORMAL LOW (ref 12.0–15.0)
MCH: 32.9 pg (ref 26.0–34.0)
MCHC: 34.7 g/dL (ref 30.0–36.0)
MCV: 94.7 fL (ref 80.0–100.0)
Platelets: 248 10*3/uL (ref 150–400)
RBC: 3.59 MIL/uL — ABNORMAL LOW (ref 3.87–5.11)
RDW: 12.9 % (ref 11.5–15.5)
WBC: 8.5 10*3/uL (ref 4.0–10.5)
nRBC: 0 % (ref 0.0–0.2)

## 2019-04-05 LAB — RAPID URINE DRUG SCREEN, HOSP PERFORMED
Amphetamines: NOT DETECTED
Barbiturates: NOT DETECTED
Benzodiazepines: POSITIVE — AB
Cocaine: NOT DETECTED
Opiates: NOT DETECTED
Tetrahydrocannabinol: NOT DETECTED

## 2019-04-05 NOTE — ED Provider Notes (Signed)
[facility] DEPT Provider Note   CSN: [id number] Arrival date & time: 04/05/19  1609     History   Chief Complaint Chief Complaint  Patient presents with  . Hematuria  . Flank Pain    HPI Tonya Davidson is a 28 y.o. female.     HPI 28 year old female with a history of recurrent back and flank pain presents the emergency department with severe left flank pain.  She has been seen in the emergency department several times including April 01, 2019 and March 28, 2019.  She states recurrent blood in her urine.  She has a prior history of chronic back pain and sciatica for which she used to be managed at a pain clinic on oxycodone.  She reports that she no longer is involved with the pain clinic and no longer takes narcotics.  She is currently [redacted] weeks pregnant.  She underwent renal ultrasound twice in the month of July which demonstrated no obstruction or other abnormalities.  She denies vaginal bleeding.  On July 14 she also underwent pelvic ultrasound Doppler which ruled out torsion and had a ultrasound of her fetus which demonstrated intrauterine pregnancy with appropriate dates and size.  She reports trying Tylenol without improvement in her symptoms.  She reports her left flank pain is severe in nature at this time.  No weakness of her legs   Past Medical History:  Diagnosis Date  . Abnormal Pap smear    colposcopy  . Anemia   . Anxiety   . Arthritis   . Bipolar 1 disorder ([place])   . C. difficile diarrhea   . Chlamydia 04/29/2012  . Chronic pain   . Depression   . Drug-seeking behavior   . Fibromyalgia   . HPV in female   . IBS (irritable bowel syndrome)   . Interstitial cystitis   . Kidney stones   . Mononucleosis   . Ovarian cyst 01/2019   right   . Pelvic pain in female 12/14/2014  . PID (acute pelvic inflammatory disease) 04/28/2012  . Ulcer     Patient Active Problem List   Diagnosis Date Noted  . Pelvic pain affecting pregnancy  02/13/2019  . Diarrhea 02/13/2019  . Elevated bilirubin 02/12/2019  . Polysubstance abuse ([place]) 10/13/2018  . 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 Pyelonephritis 09/15/2016  . Right flank pain   . Pelvic pain in female 12/14/2014  . Severe episode of recurrent major depressive disorder ([place]) 10/03/2013  . GAD (generalized anxiety disorder) 09/30/2013  . PID (acute pelvic inflammatory disease) 04/28/2012  . ANXIETY 05/18/2009  . IBS 05/18/2009    Past Surgical History:  Procedure Laterality Date  . COLPOSCOPY    . INNER EAR SURGERY    . KIDNEY STONE SURGERY    . LAPAROSCOPIC CHOLECYSTECTOMY    . TONSILLECTOMY    . WISDOM TOOTH EXTRACTION       OB History    Gravida  1   Para      Term      Preterm      AB      Living        SAB      TAB      Ectopic      Multiple      Live Births               Home Medications    Prior to Admission medications   Medication Sig  Start Date End Date Taking? Authorizing Provider  acetaminophen (TYLENOL) 325 MG tablet Take 2 tablets (650 mg total) by mouth every 6 (six) hours as needed for mild pain or moderate pain. (May buy over the counter) Patient not taking: Reported on 03/28/2019 10/15/18   Aldean BakerSykes, Janet E, NP  cephALEXin (KEFLEX) 500 MG capsule 1 cap po bid x 14 days 04/01/19   Palumbo, April, MD  clonazePAM (KLONOPIN) 0.5 MG tablet Take 1 tablet (0.5 mg total) by mouth 2 (two) times daily as needed (anxiety or insomnia). 10/15/18   Aldean BakerSykes, Janet E, NP  dicyclomine (BENTYL) 20 MG tablet Take 1 tablet (20 mg total) by mouth 2 (two) times daily. Patient not taking: Reported on 03/28/2019 11/18/18   Garlon HatchetSanders, Lisa M, PA-C  Doxylamine-Pyridoxine 10-10 MG TBEC Take 2 tablets by mouth at bedtime as needed (for nausea). 02/20/19   Antony MaduraHumes, Kelly, PA-C  hydrOXYzine (ATARAX/VISTARIL) 25 MG tablet Take 1 tablet (25 mg total) by mouth every 6 (six) hours as needed (anxiety/agitation or  CIWA < or = 10). Patient not taking: Reported on 03/28/2019 10/15/18   Aldean BakerSykes, Janet E, NP  ondansetron (ZOFRAN ODT) 4 MG disintegrating tablet Take 1 tablet (4 mg total) by mouth every 8 (eight) hours as needed for nausea. Patient not taking: Reported on 03/28/2019 11/18/18   Garlon HatchetSanders, Lisa M, PA-C  Pediatric Multiple Vit-C-FA (FLINSTONES GUMMIES OMEGA-3 DHA) CHEW Chew 2 tablets by mouth daily.    [provider]  simethicone (GAS-X) 80 MG chewable tablet Chew 1 tablet (80 mg total) by mouth every 6 (six) hours as needed for flatulence. Patient not taking: Reported on 03/28/2019 02/13/19   Aviva SignsWilliams, Marie L, CNM    Family History Family History  Problem Relation Age of Onset  . Bipolar disorder Mother   . Cancer Father   . ADD / ADHD Brother   . Cancer Maternal Grandmother   . Cancer Maternal Grandfather   . Cancer Paternal Grandmother   . Cancer Paternal Grandfather     Social History Social History   Tobacco Use  . Smoking status: Current Every Day Smoker    Packs/day: 0.50    Years: 5.00    Pack years: 2.50    Types: Cigarettes  . Smokeless tobacco: Never Used  Substance Use Topics  . Alcohol use: No  . Drug use: Not Currently    Types: Marijuana    Comment: last used 3wks ago      Allergies   Azithromycin, Doxycycline, Prednisone, Bactrim [sulfamethoxazole-trimethoprim], Flagyl [metronidazole], Toradol [ketorolac tromethamine], and Morphine and related   Review of Systems Review of Systems  All other systems reviewed and are negative.    Physical Exam Updated Vital Signs BP 106/70 (BP Location: Left Arm)   Pulse (!) 114   Temp 98.2 F (36.8 C) (Oral)   Resp 17   LMP 12/01/2018   SpO2 100%   Physical Exam Vitals signs and nursing note reviewed.  Constitutional:      General: She is not in acute distress.    Appearance: She is well-developed.  HENT:     Head: Normocephalic and atraumatic.  Neck:     Musculoskeletal: Normal range of motion.   Cardiovascular:     Rate and Rhythm: Normal rate and regular rhythm.     Heart sounds: Normal heart sounds.  Pulmonary:     Effort: Pulmonary effort is normal.     Breath sounds: Normal breath sounds.  Abdominal:     General: There is no  distension.     Palpations: Abdomen is soft.     Tenderness: There is no abdominal tenderness.  Musculoskeletal: Normal range of motion.     Comments: No thoracic or lumbar tenderness.  Paralumbar tenderness on the left without significant spasm.  No rash noted.  Full range of motion of bilateral lower extremity major joints.  Skin:    General: Skin is warm and dry.  Neurological:     Mental Status: She is alert and oriented to person, place, and time.  Psychiatric:        Judgment: Judgment normal.      ED Treatments / Results  Labs (all labs ordered are listed, but only abnormal results are displayed) Labs Reviewed  URINALYSIS, ROUTINE W REFLEX MICROSCOPIC - Abnormal; Notable for the following components:      Result Value   APPearance HAZY (*)    Hgb urine dipstick LARGE (*)    Leukocytes,Ua TRACE (*)    RBC / HPF >50 (*)    Bacteria, UA RARE (*)    All other components within normal limits  RAPID URINE DRUG SCREEN, HOSP PERFORMED - Abnormal; Notable for the following components:   Benzodiazepines POSITIVE (*)    All other components within normal limits  CBC - Abnormal; Notable for the following components:   RBC 3.59 (*)    Hemoglobin 11.8 (*)    HCT 34.0 (*)    All other components within normal limits  BASIC METABOLIC PANEL - Abnormal; Notable for the following components:   CO2 19 (*)    All other components within normal limits  URINE CULTURE    EKG None  Radiology No results found.  Procedures Procedures (including critical care time)  Medications Ordered in ED Medications - No data to display   Initial Impression / Assessment and Plan / ED Course  I have reviewed the triage vital signs and the nursing notes.   Pertinent labs & imaging results that were available during my care of the patient were reviewed by me and considered in my medical decision making (see chart for details).        Renal ultrasound x2, pelvic Doppler, OB ultrasound results from earlier in the month personally reviewed.  Abdominal exam is benign.  No vaginal bleeding.  Hematuria without any white blood cells in her urine.  Urine culture sent.  I recommended outpatient urology and primary care follow-up.  She may benefit from following back up with her pain team to see if they have any options.  Everyone will likely be limited secondary to her history of narcotic misuse and her current pregnancy.  I recommended ongoing use of Tylenol and heat for pain.  I recommended that she try over-the-counter Lidoderm patches as well.  I have also recommended massage therapy and chiropractic management for possible alternative therapies  Final Clinical Impressions(s) / ED Diagnoses   Final diagnoses:  Recurrent low back pain  Left flank pain  Hematuria, unspecified type    ED Discharge Orders    None       Azalia Bilisampos, Araeya Lamb, MD 04/05/19 1743

## 2019-04-05 NOTE — Discharge Instructions (Addendum)
Please call your primary care physician  Take Tylenol as needed for the pain  Take your prenatal vitamin daily  Please call the urologist for follow-up given the recurrent blood in your urine.

## 2019-04-05 NOTE — ED Triage Notes (Signed)
Pt reports that last week she was admitted for kidney stones. Was back here 4 days ago bc still having pains and not feeling any better. States that blood in her urine since yesterday and severe back pains.

## 2019-04-05 NOTE — ED Notes (Signed)
Called lab to add on UDS, spoke with Ulice Dash to add on.

## 2019-04-07 LAB — URINE CULTURE

## 2019-07-08 ENCOUNTER — Inpatient Hospital Stay (HOSPITAL_COMMUNITY)
Admission: AD | Admit: 2019-07-08 | Discharge: 2019-07-08 | Disposition: A | Discharge status: Home / Self Care | Payer: Medicaid Other | Attending: Obstetrics & Gynecology | Admitting: Obstetrics & Gynecology

## 2019-07-08 ENCOUNTER — Inpatient Hospital Stay (HOSPITAL_COMMUNITY): Payer: Medicaid Other

## 2019-07-08 ENCOUNTER — Encounter (HOSPITAL_COMMUNITY): Payer: Self-pay

## 2019-07-08 ENCOUNTER — Other Ambulatory Visit: Payer: Self-pay

## 2019-07-08 DIAGNOSIS — O26892 Other specified pregnancy related conditions, second trimester: Secondary | ICD-10-CM | POA: Diagnosis not present

## 2019-07-08 DIAGNOSIS — R300 Dysuria: Secondary | ICD-10-CM | POA: Diagnosis not present

## 2019-07-08 DIAGNOSIS — N949 Unspecified condition associated with female genital organs and menstrual cycle: Secondary | ICD-10-CM

## 2019-07-08 DIAGNOSIS — O99332 Smoking (tobacco) complicating pregnancy, second trimester: Secondary | ICD-10-CM | POA: Insufficient documentation

## 2019-07-08 DIAGNOSIS — R102 Pelvic and perineal pain: Secondary | ICD-10-CM | POA: Insufficient documentation

## 2019-07-08 DIAGNOSIS — Z3689 Encounter for other specified antenatal screening: Secondary | ICD-10-CM

## 2019-07-08 DIAGNOSIS — M549 Dorsalgia, unspecified: Secondary | ICD-10-CM | POA: Diagnosis not present

## 2019-07-08 DIAGNOSIS — Z3A26 26 weeks gestation of pregnancy: Secondary | ICD-10-CM | POA: Diagnosis not present

## 2019-07-08 DIAGNOSIS — R39859 Costovertebral (angle) tenderness, unspecified side: Secondary | ICD-10-CM

## 2019-07-08 DIAGNOSIS — F17210 Nicotine dependence, cigarettes, uncomplicated: Secondary | ICD-10-CM | POA: Insufficient documentation

## 2019-07-08 LAB — URINALYSIS, ROUTINE W REFLEX MICROSCOPIC
Bilirubin Urine: NEGATIVE
Glucose, UA: NEGATIVE mg/dL
Hgb urine dipstick: NEGATIVE
Ketones, ur: NEGATIVE mg/dL
Leukocytes,Ua: NEGATIVE
Nitrite: NEGATIVE
Protein, ur: NEGATIVE mg/dL
Specific Gravity, Urine: 1.005 (ref 1.005–1.030)
pH: 7 (ref 5.0–8.0)

## 2019-07-08 LAB — WET PREP, GENITAL
Clue Cells Wet Prep HPF POC: NONE SEEN
Sperm: NONE SEEN
Trich, Wet Prep: NONE SEEN
Yeast Wet Prep HPF POC: NONE SEEN

## 2019-07-08 LAB — FETAL FIBRONECTIN: Fetal Fibronectin: NEGATIVE

## 2019-07-08 LAB — CULTURE, OB URINE: Culture: NO GROWTH

## 2019-07-08 MED ORDER — ACETAMINOPHEN 500 MG PO TABS
1000.0000 mg | ORAL_TABLET | Freq: Once | ORAL | Status: AC
  Administered 2019-07-08: 1000 mg via ORAL
  Filled 2019-07-08: qty 2

## 2019-07-08 MED ORDER — COMFORT FIT MATERNITY SUPP SM MISC: 1.0000 [IU] | Freq: Every day | 0 refills | Status: AC | PRN

## 2019-07-08 MED ORDER — CYCLOBENZAPRINE HCL 10 MG PO TABS
10.0000 mg | ORAL_TABLET | Freq: Once | ORAL | Status: AC
  Administered 2019-07-08: 10 mg via ORAL
  Filled 2019-07-08: qty 1

## 2019-07-08 NOTE — Discharge Instructions (Signed)
Round Ligament Pain  The round ligament is a cord of muscle and tissue that helps support the uterus. It can become a source of pain during pregnancy if it becomes stretched or twisted as the baby grows. The pain usually begins in the second trimester (13-28 weeks) of pregnancy, and it can come and go until the baby is delivered. It is not a serious problem, and it does not cause harm to the baby. Round ligament pain is usually a short, sharp, and pinching pain, but it can also be a dull, lingering, and aching pain. The pain is felt in the lower side of the abdomen or in the groin. It usually starts deep in the groin and moves up to the outside of the hip area. The pain may occur when you:  Suddenly change position, such as quickly going from a sitting to standing position.  Roll over in bed.  Cough or sneeze.  Do physical activity. Follow these instructions at home:   Watch your condition for any changes.  When the pain starts, relax. Then try any of these methods to help with the pain: ? Sitting down. ? Flexing your knees up to your abdomen. ? Lying on your side with one pillow under your abdomen and another pillow between your legs. ? Sitting in a warm bath for 15-20 minutes or until the pain goes away.  Take over-the-counter and prescription medicines only as told by your health care provider.  Move slowly when you sit down or stand up.  Avoid long walks if they cause pain.  Stop or reduce your physical activities if they cause pain.  Keep all follow-up visits as told by your health care provider. This is important. Contact a health care provider if:  Your pain does not go away with treatment.  You feel pain in your back that you did not have before.  Your medicine is not helping. Get help right away if:  You have a fever or chills.  You develop uterine contractions.  You have vaginal bleeding.  You have nausea or vomiting.  You have diarrhea.  You have pain  when you urinate. Summary  Round ligament pain is felt in the lower abdomen or groin. It is usually a short, sharp, and pinching pain. It can also be a dull, lingering, and aching pain.  This pain usually begins in the second trimester (13-28 weeks). It occurs because the uterus is stretching with the growing baby, and it is not harmful to the baby.  You may notice the pain when you suddenly change position, when you cough or sneeze, or during physical activity.  Relaxing, flexing your knees to your abdomen, lying on one side, or taking a warm bath may help to get rid of the pain.  Get help from your health care provider if the pain does not go away or if you have vaginal bleeding, nausea, vomiting, diarrhea, or painful urination. This information is not intended to replace advice given to you by your health care provider. Make sure you discuss any questions you have with your health care provider. Document Released: 06/13/2008 Document Revised: 02/20/2018 Document Reviewed: 02/20/2018 Elsevier Patient Education  2020 [employer]. Preterm Labor and Birth Information  The normal length of a pregnancy is 39-41 weeks. Preterm labor is when labor starts before 37 completed weeks of pregnancy. What are the risk factors for preterm labor? Preterm labor is more likely to occur in women who:  Have certain infections during pregnancy such as  a bladder infection, sexually transmitted infection, or infection inside the uterus (chorioamnionitis).  Have a shorter-than-normal cervix.  Have gone into preterm labor before.  Have had surgery on their cervix.  Are younger than age 13 or older than age 30.  Are African American.  Are pregnant with twins or multiple babies (multiple gestation).  Take street drugs or smoke while pregnant.  Do not gain enough weight while pregnant.  Became pregnant shortly after having been pregnant. What are the symptoms of preterm labor? Symptoms of preterm  labor include:  Cramps similar to those that can happen during a menstrual period. The cramps may happen with diarrhea.  Pain in the abdomen or lower back.  Regular uterine contractions that may feel like tightening of the abdomen.  A feeling of increased pressure in the pelvis.  Increased watery or bloody mucus discharge from the vagina.  Water breaking (ruptured amniotic sac). Why is it important to recognize signs of preterm labor? It is important to recognize signs of preterm labor because babies who are born prematurely may not be fully developed. This can put them at an increased risk for:  Long-term (chronic) heart and lung problems.  Difficulty immediately after birth with regulating body systems, including blood sugar, body temperature, heart rate, and breathing rate.  Bleeding in the brain.  Cerebral palsy.  Learning difficulties.  Death. These risks are highest for babies who are born before 34 weeks of pregnancy. How is preterm labor treated? Treatment depends on the length of your pregnancy, your condition, and the health of your baby. It may involve:  Having a stitch (suture) placed in your cervix to prevent your cervix from opening too early (cerclage).  Taking or being given medicines, such as: ? Hormone medicines. These may be given early in pregnancy to help support the pregnancy. ? Medicine to stop contractions. ? Medicines to help mature the babys lungs. These may be prescribed if the risk of delivery is high. ? Medicines to prevent your baby from developing cerebral palsy. If the labor happens before 34 weeks of pregnancy, you may need to stay in the hospital. What should I do if I think I am in preterm labor? If you think that you are going into preterm labor, call your health care provider right away. How can I prevent preterm labor in future pregnancies? To increase your chance of having a full-term pregnancy:  Do not use any tobacco products, such  as cigarettes, chewing tobacco, and e-cigarettes. If you need help quitting, ask your health care provider.  Do not use street drugs or medicines that have not been prescribed to you during your pregnancy.  Talk with your health care provider before taking any herbal supplements, even if you have been taking them regularly.  Make sure you gain a healthy amount of weight during your pregnancy.  Watch for infection. If you think that you might have an infection, get it checked right away.  Make sure to tell your health care provider if you have gone into preterm labor before. This information is not intended to replace advice given to you by your health care provider. Make sure you discuss any questions you have with your health care provider. Document Released: 11/25/2003 Document Revised: 12/27/2018 Document Reviewed: 01/26/2016 Elsevier Patient Education  2020 Elsevier Inc. Abdominal Pain During Pregnancy  Abdominal pain is common during pregnancy, and has many possible causes. Some causes are more serious than others, and sometimes the cause is not known. Abdominal pain can  be a sign that labor is starting. It can also be caused by normal growth and stretching of muscles and ligaments during pregnancy. Always tell your health care provider if you have any abdominal pain. Follow these instructions at home:  Do not have sex or put anything in your vagina until your pain goes away completely.  Get plenty of rest until your pain improves.  Drink enough fluid to keep your urine pale yellow.  Take over-the-counter and prescription medicines only as told by your health care provider.  Keep all follow-up visits as told by your health care provider. This is important. Contact a health care provider if:  Your pain continues or gets worse after resting.  You have lower abdominal pain that: ? Comes and goes at regular intervals. ? Spreads to your back. ? Is similar to menstrual  cramps.  You have pain or burning when you urinate. Get help right away if:  You have a fever or chills.  You have vaginal bleeding.  You are leaking fluid from your vagina.  You are passing tissue from your vagina.  You have vomiting or diarrhea that lasts for more than 24 hours.  Your baby is moving less than usual.  You feel very weak or faint.  You have shortness of breath.  You develop severe pain in your upper abdomen. Summary  Abdominal pain is common during pregnancy, and has many possible causes.  If you experience abdominal pain during pregnancy, tell your health care provider right away.  Follow your health care provider's home care instructions and keep all follow-up visits as directed. This information is not intended to replace advice given to you by your health care provider. Make sure you discuss any questions you have with your health care provider. Document Released: 09/04/2005 Document Revised: 12/23/2018 Document Reviewed: 12/07/2016 Elsevier Patient Education  2020 Elsevier Inc.  Back Pain in Pregnancy Back pain during pregnancy is common. Back pain may be caused by several factors that are related to changes during your pregnancy. Follow these instructions at home: Managing pain, stiffness, and swelling      If directed, for sudden (acute) back pain, put ice on the painful area. ? Put ice in a plastic bag. ? Place a towel between your skin and the bag. ? Leave the ice on for 20 minutes, 2-3 times per day.  If directed, apply heat to the affected area before you exercise. Use the heat source that your health care provider recommends, such as a moist heat pack or a heating pad. ? Place a towel between your skin and the heat source. ? Leave the heat on for 20-30 minutes. ? Remove the heat if your skin turns bright red. This is especially important if you are unable to feel pain, heat, or cold. You may have a greater risk of getting burned.  If  directed, massage the affected area. Activity  Exercise as told by your health care provider. Gentle exercise is the best way to prevent or manage back pain.  Listen to your body when lifting. If lifting hurts, ask for help or bend your knees. This uses your leg muscles instead of your back muscles.  Squat down when picking up something from the floor. Do not bend over.  Only use bed rest for short periods as told by your health care provider. Bed rest should only be used for the most severe episodes of back pain. Standing, sitting, and lying down  Do not stand in one place  for long periods of time.  Use good posture when sitting. Make sure your head rests over your shoulders and is not hanging forward. Use a pillow on your lower back if necessary.  Try sleeping on your side, preferably the left side, with a pregnancy support pillow or 1-2 regular pillows between your legs. ? If you have back pain after a night's rest, your bed may be too soft. ? A firm mattress may provide more support for your back during pregnancy. General instructions  Do not wear high heels.  Eat a healthy diet. Try to gain weight within your health care provider's recommendations.  Use a maternity girdle, elastic sling, or back brace as told by your health care provider.  Take over-the-counter and prescription medicines only as told by your health care provider.  Work with a physical therapist or massage therapist to find ways to manage back pain. Acupuncture or massage therapy may be helpful.  Keep all follow-up visits as told by your health care provider. This is important. Contact a health care provider if:  Your back pain interferes with your daily activities.  You have increasing pain in other parts of your body. Get help right away if:  You develop numbness, tingling, weakness, or problems with the use of your arms or legs.  You develop severe back pain that is not controlled with medicine.  You  have a change in bowel or bladder control.  You develop shortness of breath, dizziness, or you faint.  You develop nausea, vomiting, or sweating.  You have back pain that is a rhythmic, cramping pain similar to labor pains. Labor pain is usually 1-2 minutes apart, lasts for about 1 minute, and involves a bearing down feeling or pressure in your pelvis.  You have back pain and your water breaks or you have vaginal bleeding.  You have back pain or numbness that travels down your leg.  Your back pain developed after you fell.  You develop pain on one side of your back.  You see blood in your urine.  You develop skin blisters in the area of your back pain. Summary  Back pain may be caused by several factors that are related to changes during your pregnancy.  Follow instructions as told by your health care provider for managing pain, stiffness, and swelling.  Exercise as told by your health care provider. Gentle exercise is the best way to prevent or manage back pain.  Take over-the-counter and prescription medicines only as told by your health care provider.  Keep all follow-up visits as told by your health care provider. This is important. This information is not intended to replace advice given to you by your health care provider. Make sure you discuss any questions you have with your health care provider. Document Released: 12/13/2005 Document Revised: 12/24/2018 Document Reviewed: 02/20/2018 Elsevier Patient Education  2020 [facility]     Phone: [phone]  [facility] [place] [facility]  Phone: [phone]  [facility] [place] [facility] [place]  Phone: [place] [facility] [employer] [facility] [place]                           Phone: [place] [facility] [place] [facility]          Phone: [phone]  [facility]     Phone: [phone]  [facility]    Phone: [phone]  [facility]    Phone: [phone]  [facility]    Phone: [phone]  [facility]    Phone: [phone]  [facility]  Phone: [phone]  [facility]               Phone: [phone]  [facility]   Phone: [phone]  [facility]    Phone: [phone]                   PREGNANCY SUPPORT BELT: You are not alone, Seventy-five percent of women have some sort of abdominal or back pain at some point in their pregnancy. Your baby is growing at a fast pace, which means that your whole body is rapidly trying to adjust to the changes. As your uterus grows, your back may start feeling a bit under stress and this can result in back or abdominal pain that can go from mild, and therefore bearable, to severe pains that will not allow you to sit or lay down comfortably, When it comes to dealing with pregnancy-related pains and cramps, some pregnant women usually prefer natural remedies, which the market is filled with nowadays. For example, wearing a pregnancy support belt can help ease and lessen your discomfort and pain. WHAT ARE THE BENEFITS OF WEARING A PREGNANCY SUPPORT BELT? A pregnancy support belt provides support to the lower portion of the belly taking some of the weight of the growing uterus and distributing to the other parts of your body. It is designed make you comfortable and gives you extra support. Over the years, the pregnancy apparel market has been studying the needs and wants of pregnant women and they have come up with the most comfortable pregnancy support belts that woman could ever ask for. In fact, you will no longer have to wear a stretched-out or bulky pregnancy belt that is visible underneath your clothes and makes you feel even  more uncomfortable. Nowadays, a pregnancy support belt is made of comfortable and stretchy materials that will not irritate your skin but will actually make you feel at ease and you will not even notice you are wearing it. They are easy to put on and adjust during the day and can be worn at night for additional support.  BENEFITS:  Relives Back pain  Relieves Abdominal Muscle and Leg Pain  Stabilizes the Pelvic Ring  Offers a Cushioned Abdominal Lift Pad  Relieves pressure on the Sciatic Nerve Within Minutes WHERE TO GET YOUR PREGNANCY BELT: [employer] [phone] @[address]  [address] [place], [place] [id number]

## 2019-07-08 NOTE — MAU Note (Signed)
.  Tonya Davidson is a 28 y.o. at [redacted]w[redacted]d here in MAU reporting: tightness in stomach that comes and goes and burning and spasm when she pees. Also reports slight green vaginal discharge. No VB or LOF. And + Fetal movement  Onset of complaint: 1600 07/07/19 Pain score: 8  FHT:155 Lab orders placed from triage:

## 2019-07-08 NOTE — MAU Provider Note (Signed)
History     CSN: [id number]  Arrival date and time: 07/08/19 9628   First Provider Initiated Contact with Patient 07/08/19 0135      Chief Complaint  Patient presents with  . Contractions   Tonya Davidson is a 28 y.o. G1P0 at [redacted]w[redacted]d who presents to MAU for contractions. Pt does not have a pregnancy support belt at home. Pt reports last intercourse one week ago.  Onset: 400PM Location: abdomen Duration: <24hrs Character: "feels like when you drink a lot of water and you have to stick your stomach out because it hurts," pt also reports intermittent stabbing pain that she reports is "around the sides of my stomach" Aggravating/Associated: none/none Relieving: walking Treatment: none Severity: 8/10  Pt also reports burning and spasm with urination. Pt reports when she has spasms with urination, it usually means she has a kidney stone. Pt reports back pain, but only pain in the lower back.  Pt denies VB, LOF, ctx, decreased FM, vaginal discharge/odor/itching. Pt denies N/V, constipation, diarrhea. Pt denies fever, chills, fatigue, sweating or changes in appetite. Pt denies SOB or chest pain. Pt denies dizziness, HA, light-headedness, weakness.  Problems this pregnancy include: kidney infections. Allergies? Azithromycin, doxycycline, prednisone, bactrim Current medications/supplements? Flinestone gummies, Z-quil at night (last took last night) Prenatal care provider? [facility], next appt November 2020, exact date unknown   OB History    Gravida  1   Para      Term      Preterm      AB      Living        SAB      TAB      Ectopic      Multiple      Live Births              Past Medical History:  Diagnosis Date  . Abnormal Pap smear    colposcopy  . Anemia   . Anxiety   . Arthritis   . Bipolar 1 disorder ([place])   . C. difficile diarrhea   . Chlamydia 04/29/2012  . Chronic pain   . Depression   . Drug-seeking behavior   . Fibromyalgia   .  HPV in female   . IBS (irritable bowel syndrome)   . Interstitial cystitis   . Kidney stones   . Mononucleosis   . Ovarian cyst 01/2019   right   . Pelvic pain in female 12/14/2014  . PID (acute pelvic inflammatory disease) 04/28/2012  . Ulcer     Past Surgical History:  Procedure Laterality Date  . COLPOSCOPY    . INNER EAR SURGERY    . KIDNEY STONE SURGERY    . LAPAROSCOPIC CHOLECYSTECTOMY    . TONSILLECTOMY    . WISDOM TOOTH EXTRACTION      Family History  Problem Relation Age of Onset  . Bipolar disorder Mother   . Cancer Father   . ADD / ADHD Brother   . Cancer Maternal Grandmother   . Cancer Maternal Grandfather   . Cancer Paternal Grandmother   . Cancer Paternal Grandfather     Social History   Tobacco Use  . Smoking status: Current Every Day Smoker    Packs/day: 0.50    Years: 5.00    Pack years: 2.50    Types: Cigarettes  . Smokeless tobacco: Never Used  Substance Use Topics  . Alcohol use: No  . Drug use: Not Currently    Types: Marijuana  Comment: last used 3wks ago     Allergies:  Allergies  Allergen Reactions  . Azithromycin Other (See Comments)    GI upset and abdominal pain  . Doxycycline Nausea And Vomiting    Pt states she gets violently sick   . Prednisone Other (See Comments)    Impacts Bipolar symptoms   . Bactrim [Sulfamethoxazole-Trimethoprim] Swelling  . Flagyl [Metronidazole] Nausea And Vomiting  . Toradol [Ketorolac Tromethamine] Other (See Comments)    Makes arms tingly  . Morphine And Related Palpitations    Minor hives - pt is okay to take, hasn't needed benadryl in past    Medications Prior to Admission  Medication Sig Dispense Refill Last Dose  . Pediatric Multiple Vit-C-FA (FLINSTONES GUMMIES OMEGA-3 DHA) CHEW Chew 2 tablets by mouth daily.   07/07/2019 at 1500  . acetaminophen (TYLENOL) 325 MG tablet Take 2 tablets (650 mg total) by mouth every 6 (six) hours as needed for mild pain or moderate pain. (May buy over the  counter) (Patient not taking: Reported on 03/28/2019) 1 tablet 0   . cephALEXin (KEFLEX) 500 MG capsule 1 cap po bid x 14 days 28 capsule 0   . clonazePAM (KLONOPIN) 0.5 MG tablet Take 1 tablet (0.5 mg total) by mouth 2 (two) times daily as needed (anxiety or insomnia). 10 tablet 0   . dicyclomine (BENTYL) 20 MG tablet Take 1 tablet (20 mg total) by mouth 2 (two) times daily. (Patient not taking: Reported on 03/28/2019) 20 tablet 0   . Doxylamine-Pyridoxine 10-10 MG TBEC Take 2 tablets by mouth at bedtime as needed (for nausea). 20 tablet 0   . hydrOXYzine (ATARAX/VISTARIL) 25 MG tablet Take 1 tablet (25 mg total) by mouth every 6 (six) hours as needed (anxiety/agitation or CIWA < or = 10). (Patient not taking: Reported on 03/28/2019) 30 tablet 0   . ondansetron (ZOFRAN ODT) 4 MG disintegrating tablet Take 1 tablet (4 mg total) by mouth every 8 (eight) hours as needed for nausea. (Patient not taking: Reported on 03/28/2019) 10 tablet 0   . simethicone (GAS-X) 80 MG chewable tablet Chew 1 tablet (80 mg total) by mouth every 6 (six) hours as needed for flatulence. (Patient not taking: Reported on 03/28/2019) 30 tablet 0     Review of Systems  Constitutional: Negative for chills, diaphoresis, fatigue and fever.  Eyes: Negative for visual disturbance.  Respiratory: Negative for shortness of breath.   Cardiovascular: Negative for chest pain.  Gastrointestinal: Positive for abdominal pain. Negative for constipation, diarrhea, nausea and vomiting.  Genitourinary: Positive for dysuria. Negative for flank pain, frequency, pelvic pain, urgency, vaginal bleeding and vaginal discharge.  Musculoskeletal: Positive for back pain.  Neurological: Negative for dizziness, weakness, light-headedness and headaches.   Physical Exam   Blood pressure 107/75, pulse (!) 107, temperature 98.2 F (36.8 C), temperature source Oral, resp. rate 16, height 5\' 4"  (1.626 m), weight 58.9 kg, last menstrual period  12/01/2018.  Patient Vitals for the past 24 hrs:  BP Temp Temp src Pulse Resp Height Weight  07/08/19 0112 107/75 98.2 F (36.8 C) Oral (!) 107 16 5\' 4"  (1.626 m) 58.9 kg   Physical Exam  Constitutional: She is oriented to person, place, and time. She appears well-developed and well-nourished. No distress.  HENT:  Head: Normocephalic and atraumatic.  Respiratory: Effort normal.  GI: Soft. She exhibits no distension and no mass. There is abdominal tenderness (mild tenderness, bilaterally, on sides of abdomen). There is CVA tenderness (right side). There is  no rebound and no guarding.  Neurological: She is alert and oriented to person, place, and time.  Skin: Skin is warm and dry. She is not diaphoretic.  Psychiatric: She has a normal mood and affect. Her behavior is normal. Judgment and thought content normal.   Results for orders placed or performed during the hospital encounter of 07/08/19 (from the past 24 hour(s))  Urinalysis, Routine w reflex microscopic     Status: Abnormal   Collection Time: 07/08/19  2:07 AM  Result Value Ref Range   Color, Urine STRAW (A) YELLOW   APPearance CLEAR CLEAR   Specific Gravity, Urine 1.005 1.005 - 1.030   pH 7.0 5.0 - 8.0   Glucose, UA NEGATIVE NEGATIVE mg/dL   Hgb urine dipstick NEGATIVE NEGATIVE   Bilirubin Urine NEGATIVE NEGATIVE   Ketones, ur NEGATIVE NEGATIVE mg/dL   Protein, ur NEGATIVE NEGATIVE mg/dL   Nitrite NEGATIVE NEGATIVE   Leukocytes,Ua NEGATIVE NEGATIVE  Wet prep, genital     Status: Abnormal   Collection Time: 07/08/19  2:07 AM  Result Value Ref Range   Yeast Wet Prep HPF POC NONE SEEN NONE SEEN   Trich, Wet Prep NONE SEEN NONE SEEN   Clue Cells Wet Prep HPF POC NONE SEEN NONE SEEN   WBC, Wet Prep HPF POC FEW (A) NONE SEEN   Sperm NONE SEEN     US Renal  Result Date: 07/08/2019 CLINICAL DATA:  CV tenderness. Pregnant patient at [redacted] weeks gestation. History of renal calculi. EXAM: RENAL / URINARY TRACT ULTRASOUND COMPLETE  COMPARISON:  CT 11/18/2018, renal ultrasound 04/01/2019 FINDINGS: Right Kidney: Renal measurements: 11.2 x 5 x 5 cm = volume: 145 mL. Echogenicity within normal limits. No shadowing calculi. No mass or hydronephrosis visualized. Left Kidney: Renal measurements: 10.5 x 4.8 x 5.1 cm = volume: 138 mL. Echogenicity within normal limits. No shadowing calculi. No mass or hydronephrosis visualized. Bladder: Appears normal for degree of bladder distention. Both ureteral jets are visualized. Other: Gravid uterus incidentally noted. IMPRESSION: Normal renal ultrasound. Electronically Signed   By: Narda Rutherford M.D.   On: 07/08/2019 02:51    MAU Course  Procedures  MDM -suspect RLP, r/o PTL -no ctx on monitor, mild tenderness on exam bilaterally on sides of abdomen -CE: long/closed/posterior -Flexeril  and Tylenol  given for pain, pt reports medication did not improve symptoms -fFN: pending at time of discharge, pt reports she could not stay due to pet care issues, will send results via MyChart message. -WetPrep: few WBCs, otherwise WNL -GC/CT collected -EFM: reactive       -baseline: 150       -variability: moderate       -accels: present, 15x15       -decels: absent       -TOCO: no ctx  -possible UTI vs. kidney stone -+CVA tenderness on right side -UA: straw, otherwise WNL -urine culture sent -US Renal: normal renal US  -called and spoke with Dr. Despina Hidden . Per Dr. Despina Hidden, nothing to do at this time, have patient drink plenty of fluids and return to MAU if symptoms worsen. -pt discharged to home in stable condition  Orders Placed This Encounter  Procedures  . Culture, OB Urine    Standing Status:   Standing    Number of Occurrences:   1  . Wet prep, genital    Standing Status:   Standing    Number of Occurrences:   1  . US RENAL    Standing Status:  Standing    Number of Occurrences:   1    Order Specific Question:   Symptom/Reason for Exam    Answer:   Costovertebral  angle tenderness [[id number]]  . Urinalysis, Routine w reflex microscopic    Standing Status:   Standing    Number of Occurrences:   1  . Fetal fibronectin    Standing Status:   Standing    Number of Occurrences:   1  . Discharge patient    Order Specific Question:   Discharge disposition    Answer:   01-Home or Self Care [1]    Order Specific Question:   Discharge patient date    Answer:   07/08/2019   Meds ordered this encounter  Medications  . acetaminophen (TYLENOL) tablet 1,000 mg  . cyclobenzaprine (FLEXERIL) tablet 10 mg  . Elastic Bandages & Supports (COMFORT FIT MATERNITY SUPP SM) MISC    Sig: 1 Units by Does not apply route daily as needed.    Dispense:  1 each    Refill:  0    Order Specific Question:   Supervising Provider    Answer:   Duane Lope H [[id number]]   Assessment and Plan   1. Round ligament pain   2. Costovertebral angle tenderness   3. [redacted] weeks gestation of pregnancy   4. NST (non-stress test) reactive   5. Dysuria during pregnancy in second trimester    Allergies as of 07/08/2019      Reactions   Azithromycin Other (See Comments)   GI upset and abdominal pain   Doxycycline Nausea And Vomiting   Pt states she gets violently sick    Prednisone Other (See Comments)   Impacts Bipolar symptoms    Bactrim [sulfamethoxazole-trimethoprim] Swelling   Flagyl [metronidazole] Nausea And Vomiting   Toradol [ketorolac Tromethamine] Other (See Comments)   Makes arms tingly   Morphine And Related Palpitations   Minor hives - pt is okay to take, hasn't needed benadryl in past      Medication List    TAKE these medications   acetaminophen 325 MG tablet Commonly known as: TYLENOL Take 2 tablets (650 mg total) by mouth every 6 (six) hours as needed for mild pain or moderate pain. (May buy over the counter)   cephALEXin 500 MG capsule Commonly known as: Keflex 1 cap po bid x 14 days   clonazePAM 0.5 MG tablet Commonly known as: KLONOPIN Take 1 tablet (0.5 mg  total) by mouth 2 (two) times daily as needed (anxiety or insomnia).   Comfort Fit Maternity Supp Sm Misc 1 Units by Does not apply route daily as needed.   dicyclomine 20 MG tablet Commonly known as: BENTYL Take 1 tablet (20 mg total) by mouth 2 (two) times daily.   Doxylamine-Pyridoxine 10-10 MG Tbec Take 2 tablets by mouth at bedtime as needed (for nausea).   Flinstones Gummies Omega-3 DHA Chew Chew 2 tablets by mouth daily.   hydrOXYzine 25 MG tablet Commonly known as: ATARAX/VISTARIL Take 1 tablet (25 mg total) by mouth every 6 (six) hours as needed (anxiety/agitation or CIWA < or = 10).   ondansetron 4 MG disintegrating tablet Commonly known as: Zofran ODT Take 1 tablet (4 mg total) by mouth every 8 (eight) hours as needed for nausea.   simethicone 80 MG chewable tablet Commonly known as: Gas-X Chew 1 tablet (80 mg total) by mouth every 6 (six) hours as needed for flatulence.      -will call with culture results,  if positive -RX pregnancy belt with instructions -pt advised to schedule f/u with OB this week -declines new referral to urology, despite saying that she has difficulty getting an appointment with them -list of OB providers given, pt requesting [facility] -stric pyelonephritis/kidney stone/VB/pain/return MAU precautions given -pt discharged to home in stable condition  Joni Reiningicole E Nugent 07/08/2019, 3:32 AM

## 2019-07-10 LAB — GC/CHLAMYDIA PROBE AMP (~~LOC~~) NOT AT ARMC
Chlamydia: NEGATIVE
Comment: NEGATIVE
Comment: NORMAL
Neisseria Gonorrhea: NEGATIVE

## 2019-07-25 ENCOUNTER — Encounter (HOSPITAL_COMMUNITY): Payer: Self-pay | Admitting: Emergency Medicine

## 2019-07-25 ENCOUNTER — Emergency Department (HOSPITAL_COMMUNITY)
Admission: EM | Admit: 2019-07-25 | Discharge: 2019-07-25 | Disposition: A | Discharge status: Home / Self Care | Payer: Medicaid Other | Attending: Emergency Medicine | Admitting: Emergency Medicine

## 2019-07-25 ENCOUNTER — Other Ambulatory Visit: Payer: Self-pay

## 2019-07-25 DIAGNOSIS — R319 Hematuria, unspecified: Secondary | ICD-10-CM | POA: Diagnosis not present

## 2019-07-25 DIAGNOSIS — Z79899 Other long term (current) drug therapy: Secondary | ICD-10-CM | POA: Insufficient documentation

## 2019-07-25 DIAGNOSIS — R300 Dysuria: Secondary | ICD-10-CM

## 2019-07-25 DIAGNOSIS — F17210 Nicotine dependence, cigarettes, uncomplicated: Secondary | ICD-10-CM | POA: Diagnosis not present

## 2019-07-25 DIAGNOSIS — Z765 Malingerer [conscious simulation]: Secondary | ICD-10-CM | POA: Diagnosis not present

## 2019-07-25 LAB — URINALYSIS, ROUTINE W REFLEX MICROSCOPIC
Bilirubin Urine: NEGATIVE
Glucose, UA: NEGATIVE mg/dL
Ketones, ur: NEGATIVE mg/dL
Leukocytes,Ua: NEGATIVE
Nitrite: NEGATIVE
Protein, ur: NEGATIVE mg/dL
Specific Gravity, Urine: 1.009 (ref 1.005–1.030)
pH: 6 (ref 5.0–8.0)

## 2019-07-25 LAB — PREGNANCY, URINE: Preg Test, Ur: POSITIVE — AB

## 2019-07-25 MED ORDER — LIDOCAINE 5 % EX PTCH: 1.0000 | MEDICATED_PATCH | CUTANEOUS | 0 refills | Status: AC

## 2019-07-25 MED ORDER — LIDOCAINE 5 % EX PTCH
1.0000 | MEDICATED_PATCH | CUTANEOUS | Status: DC
  Filled 2019-07-25: qty 1

## 2019-07-25 MED ORDER — ACETAMINOPHEN 500 MG PO TABS
1000.0000 mg | ORAL_TABLET | Freq: Once | ORAL | Status: DC
  Filled 2019-07-25: qty 2

## 2019-07-25 MED ORDER — NITROFURANTOIN MONOHYD MACRO 100 MG PO CAPS: 100.0000 mg | ORAL_CAPSULE | Freq: Two times a day (BID) | ORAL | 0 refills | Status: AC

## 2019-07-25 MED ORDER — NITROFURANTOIN MONOHYD MACRO 100 MG PO CAPS
100.0000 mg | ORAL_CAPSULE | Freq: Once | ORAL | Status: DC
  Filled 2019-07-25: qty 1

## 2019-07-25 NOTE — ED Notes (Signed)
Pt stated that she doesn't understand why the doctor won't give her anything more for pain, the Tylenol and Lidocaine do not work. Pt also stated that Dr. Randal Buba was a "total bitch" and there was something wrong with her, she should not judge her based on her past.

## 2019-07-25 NOTE — ED Notes (Signed)
Pt refused medication and would like her discharge paperwork

## 2019-07-25 NOTE — ED Triage Notes (Signed)
Patient is [redacted] weeks pregnant. Patient complaining of pain when urinating, lower right back pain, and blood in urine. Patient states it started Sunday.

## 2019-07-25 NOTE — ED Provider Notes (Addendum)
[facility]-EMERGENCY DEPT Provider Note   CSN: [id number] Arrival date & time: 07/25/19  0007     History   Chief Complaint Chief Complaint  Patient presents with  . Dysuria  . Back Pain    HPI Tonya Davidson is a 28 y.o. female.     The history is provided by the patient.  Dysuria Pain quality:  Burning Pain severity:  Mild Onset quality:  Gradual Duration:  5 days Timing:  Constant Progression:  Unchanged Chronicity:  Recurrent Recent urinary tract infections: yes   Relieved by:  Nothing Worsened by:  Nothing Ineffective treatments: a couple of days of macrobid. Urinary symptoms: hematuria   Urinary symptoms: no discolored urine   Urinary symptoms comment:  Hematuria has been going on since July and the patient has not seen a urologist.  She had a CT scan at the end of March that did not have any stones in the ureters or kidneys.   Associated symptoms: no abdominal pain, no fever, no flank pain, no nausea, no vaginal discharge and no vomiting   Risk factors: pregnant   Risk factors: no hx of urolithiasis, no renal disease and no single kidney   Patient is G1 P 0 at 28 weeks who presents for dysuria and B low back pain and ongoing hematuria since July.  She has been seen for this in the ED and by OB not urology but had a negative renal and GU ultrasound for this issue last week.  She denies vaginal bleeding and discharge.   Past Medical History:  Diagnosis Date  . Abnormal Pap smear    colposcopy  . Anemia   . Anxiety   . Arthritis   . Bipolar 1 disorder (HCC)   . C. difficile diarrhea   . Chlamydia 04/29/2012  . Chronic pain   . Depression   . Drug-seeking behavior   . Fibromyalgia   . HPV in female   . IBS (irritable bowel syndrome)   . Interstitial cystitis   . Kidney stones   . Mononucleosis   . Ovarian cyst 01/2019   right   . Pelvic pain in female 12/14/2014  . PID (acute pelvic inflammatory disease) 04/28/2012  . Ulcer      Patient Active Problem List   Diagnosis Date Noted  . Pelvic pain affecting pregnancy 02/13/2019  . Diarrhea 02/13/2019  . Elevated bilirubin 02/12/2019  . Polysubstance abuse (HCC) 10/13/2018  . 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 Pyelonephritis 09/15/2016  . Right flank pain   . Pelvic pain in female 12/14/2014  . Severe episode of recurrent major depressive disorder (HCC) 10/03/2013  . GAD (generalized anxiety disorder) 09/30/2013  . PID (acute pelvic inflammatory disease) 04/28/2012  . ANXIETY 05/18/2009  . IBS 05/18/2009    Past Surgical History:  Procedure Laterality Date  . COLPOSCOPY    . INNER EAR SURGERY    . KIDNEY STONE SURGERY    . LAPAROSCOPIC CHOLECYSTECTOMY    . TONSILLECTOMY    . WISDOM TOOTH EXTRACTION       OB History    Gravida  1   Para      Term      Preterm      AB      Living        SAB      TAB      Ectopic      Multiple      Live  Births               Home Medications    Prior to Admission medications   Medication Sig Start Date End Date Taking? Authorizing Provider  Pediatric Multiple Vit-C-FA (FLINSTONES GUMMIES OMEGA-3 DHA) CHEW Chew 2 tablets by mouth daily.   Yes [provider]  acetaminophen (TYLENOL) 325 MG tablet Take 2 tablets (650 mg total) by mouth every 6 (six) hours as needed for mild pain or moderate pain. (May buy over the counter) Patient not taking: Reported on 03/28/2019 10/15/18   Aldean BakerSykes, Janet E, NP  cephALEXin (KEFLEX) 500 MG capsule 1 cap po bid x 14 days Patient not taking: Reported on 07/25/2019 04/01/19   Barbar Brede, MD  clonazePAM (KLONOPIN) 0.5 MG tablet Take 1 tablet (0.5 mg total) by mouth 2 (two) times daily as needed (anxiety or insomnia). Patient not taking: Reported on 07/25/2019 10/15/18   Aldean BakerSykes, Janet E, NP  dicyclomine (BENTYL) 20 MG tablet Take 1 tablet (20 mg total) by mouth 2 (two) times daily. Patient not taking: Reported  on 03/28/2019 11/18/18   Garlon HatchetSanders, Lisa M, PA-C  Doxylamine-Pyridoxine 10-10 MG TBEC Take 2 tablets by mouth at bedtime as needed (for nausea). Patient not taking: Reported on 07/25/2019 02/20/19   Antony MaduraHumes, Kelly, PA-C  Elastic Bandages & Supports (COMFORT FIT MATERNITY SUPP SM) MISC 1 Units by Does not apply route daily as needed. 07/08/19   Nugent, Odie SeraNicole E, NP  hydrOXYzine (ATARAX/VISTARIL) 25 MG tablet Take 1 tablet (25 mg total) by mouth every 6 (six) hours as needed (anxiety/agitation or CIWA < or = 10). Patient not taking: Reported on 03/28/2019 10/15/18   Aldean BakerSykes, Janet E, NP  ondansetron (ZOFRAN ODT) 4 MG disintegrating tablet Take 1 tablet (4 mg total) by mouth every 8 (eight) hours as needed for nausea. Patient not taking: Reported on 03/28/2019 11/18/18   Garlon HatchetSanders, Lisa M, PA-C  simethicone (GAS-X) 80 MG chewable tablet Chew 1 tablet (80 mg total) by mouth every 6 (six) hours as needed for flatulence. Patient not taking: Reported on 03/28/2019 02/13/19   Aviva SignsWilliams, Marie L, CNM    Family History Family History  Problem Relation Age of Onset  . Bipolar disorder Mother   . Cancer Father   . ADD / ADHD Brother   . Cancer Maternal Grandmother   . Cancer Maternal Grandfather   . Cancer Paternal Grandmother   . Cancer Paternal Grandfather     Social History Social History   Tobacco Use  . Smoking status: Current Every Day Smoker    Packs/day: 0.50    Years: 5.00    Pack years: 2.50    Types: Cigarettes  . Smokeless tobacco: Never Used  Substance Use Topics  . Alcohol use: No  . Drug use: Not Currently    Types: Marijuana    Comment: last used 3wks ago      Allergies   Azithromycin, Doxycycline, Prednisone, Bactrim [sulfamethoxazole-trimethoprim], Flagyl [metronidazole], Toradol [ketorolac tromethamine], and Morphine and related   Review of Systems Review of Systems  Constitutional: Negative for fever.  HENT: Negative for congestion.   Eyes: Negative for visual disturbance.   Respiratory: Negative for chest tightness.   Cardiovascular: Negative for chest pain.  Gastrointestinal: Negative for abdominal pain, nausea and vomiting.  Genitourinary: Positive for dysuria. Negative for flank pain, pelvic pain, vaginal bleeding, vaginal discharge and vaginal pain.  Musculoskeletal: Negative for arthralgias.  Neurological: Negative for dizziness.  Psychiatric/Behavioral: Negative for agitation.  All other systems reviewed and  are negative.    Physical Exam Updated Vital Signs BP (!) 105/59   Pulse 94   Temp 98.1 F (36.7 C) (Oral)   Resp 17   Ht 5\' 4"  (1.626 m)   Wt 58.5 kg   LMP 12/01/2018   SpO2 95%   BMI 22.14 kg/m   Physical Exam Vitals signs and nursing note reviewed.  Constitutional:      Appearance: She is normal weight.     Comments: Appears high  HENT:     Head: Normocephalic and atraumatic.     Nose: Nose normal.  Eyes:     Conjunctiva/sclera: Conjunctivae normal.     Pupils: Pupils are equal, round, and reactive to light.  Neck:     Musculoskeletal: Normal range of motion and neck supple.  Cardiovascular:     Rate and Rhythm: Normal rate and regular rhythm.     Pulses: Normal pulses.     Heart sounds: Normal heart sounds.  Pulmonary:     Effort: Pulmonary effort is normal.  Abdominal:     Tenderness: There is no abdominal tenderness. There is no guarding or rebound.     Comments: Gravid, palpable fetal movement  Musculoskeletal: Normal range of motion.  Skin:    General: Skin is warm and dry.     Capillary Refill: Capillary refill takes less than 2 seconds.  Neurological:     General: No focal deficit present.     Mental Status: She is alert and oriented to person, place, and time.  Psychiatric:        Thought Content: Thought content normal.      ED Treatments / Results  Labs (all labs ordered are listed, but only abnormal results are displayed) Results for orders placed or performed during the hospital encounter of  07/25/19  Urinalysis, Routine w reflex microscopic  Result Value Ref Range   Color, Urine YELLOW YELLOW   APPearance CLEAR CLEAR   Specific Gravity, Urine 1.009 1.005 - 1.030   pH 6.0 5.0 - 8.0   Glucose, UA NEGATIVE NEGATIVE mg/dL   Hgb urine dipstick MODERATE (A) NEGATIVE   Bilirubin Urine NEGATIVE NEGATIVE   Ketones, ur NEGATIVE NEGATIVE mg/dL   Protein, ur NEGATIVE NEGATIVE mg/dL   Nitrite NEGATIVE NEGATIVE   Leukocytes,Ua NEGATIVE NEGATIVE   RBC / HPF 6-10 0 - 5 RBC/hpf   WBC, UA 0-5 0 - 5 WBC/hpf   Bacteria, UA RARE (A) NONE SEEN   Squamous Epithelial / LPF 0-5 0 - 5   Mucus PRESENT   Pregnancy, urine  Result Value Ref Range   Preg Test, Ur POSITIVE (A) NEGATIVE   US Renal  Result Date: 07/08/2019 CLINICAL DATA:  CV tenderness. Pregnant patient at [redacted] weeks gestation. History of renal calculi. EXAM: RENAL / URINARY TRACT ULTRASOUND COMPLETE COMPARISON:  CT 11/18/2018, renal ultrasound 04/01/2019 FINDINGS: Right Kidney: Renal measurements: 11.2 x 5 x 5 cm = volume: 145 mL. Echogenicity within normal limits. No shadowing calculi. No mass or hydronephrosis visualized. Left Kidney: Renal measurements: 10.5 x 4.8 x 5.1 cm = volume: 138 mL. Echogenicity within normal limits. No shadowing calculi. No mass or hydronephrosis visualized. Bladder: Appears normal for degree of bladder distention. Both ureteral jets are visualized. Other: Gravid uterus incidentally noted. IMPRESSION: Normal renal ultrasound. Electronically Signed   By: Keith Rake M.D.   On: 07/08/2019 02:51    Radiology No results found.  Procedures Procedures (including critical care time)  Medications Ordered in ED Medications  acetaminophen (TYLENOL)  tablet 1,000 mg (has no administration in time range)  lidocaine (LIDODERM) 5 % 1 patch (has no administration in time range)  nitrofurantoin (macrocrystal-monohydrate) (MACROBID) capsule 100 mg (has no administration in time range)    Fetal heart 135  Initial  Impression / Assessment and Plan / ED Course  Give a negative CT scan and ultrasound stones are very unlikely.  I believe she needs to follow up with urology.  I have written for macrobid and lidoderm.  I have instructed close follow up with her OB whose name she can't remember.  Fetal heart tones were obtained and were normal.  She denies abdominal pain and contractions.  These issues appear to be somewhat chronic in nature and I suspect she is attempting to obtain narcotics as she has refused the above medications. She has been seen for drug seeking in the past and this is most certainly today's problem.  She will not be getting these medications from me.    Tonya Davidson was evaluated in Emergency Department on 07/25/2019 for the symptoms described in the history of present illness. She was evaluated in the context of the global COVID-19 pandemic, which necessitated consideration that the patient might be at risk for infection with the SARS-CoV-2 virus that causes COVID-19. Institutional protocols and algorithms that pertain to the evaluation of patients at risk for COVID-19 are in a state of rapid change based on information released by regulatory bodies including the CDC and federal and state organizations. These policies and algorithms were followed during the patient's care in the ED.   Final Clinical Impressions(s) / ED Diagnoses   Return for weakness, numbness, changes in vision or speech, fevers >100.4 unrelieved by medication, shortness of breath, intractable vomiting, or diarrhea, abdominal pain, Inability to tolerate liquids or food, cough, altered mental status or any concerns. No signs of systemic illness or infection. The patient is nontoxic-appearing on exam and vital signs are within normal limits.   I have reviewed the triage vital signs and the nursing notes. Pertinent labs &imaging results that were available during my care of the patient were reviewed by me and considered in my  medical decision making (see chart for details).  After history, exam, and medical workup I feel the patient has been appropriately medically screened and is safe for discharge home. Pertinent diagnoses were discussed with the patient. Patient was given return precautions    Christianne Zacher, MD 07/25/19 Hessie Dibble, Shaunee Mulkern, MD 07/25/19 [id number]

## 2019-07-26 LAB — URINE CULTURE
Culture: <10000 — AB
Special Requests: NORMAL

## 2019-08-23 ENCOUNTER — Inpatient Hospital Stay (HOSPITAL_COMMUNITY)
Admission: AD | Admit: 2019-08-23 | Discharge: 2019-08-23 | Discharge status: Against Medical Advice | Payer: Medicaid Other | Attending: Obstetrics & Gynecology | Admitting: Obstetrics & Gynecology

## 2019-08-23 ENCOUNTER — Encounter (HOSPITAL_COMMUNITY): Payer: Self-pay

## 2019-08-23 ENCOUNTER — Other Ambulatory Visit: Payer: Self-pay

## 2019-08-23 DIAGNOSIS — M1990 Unspecified osteoarthritis, unspecified site: Secondary | ICD-10-CM | POA: Insufficient documentation

## 2019-08-23 DIAGNOSIS — O99333 Smoking (tobacco) complicating pregnancy, third trimester: Secondary | ICD-10-CM | POA: Diagnosis not present

## 2019-08-23 DIAGNOSIS — R109 Unspecified abdominal pain: Secondary | ICD-10-CM | POA: Insufficient documentation

## 2019-08-23 DIAGNOSIS — Z3A32 32 weeks gestation of pregnancy: Secondary | ICD-10-CM | POA: Diagnosis not present

## 2019-08-23 DIAGNOSIS — O26893 Other specified pregnancy related conditions, third trimester: Secondary | ICD-10-CM | POA: Diagnosis not present

## 2019-08-23 DIAGNOSIS — M797 Fibromyalgia: Secondary | ICD-10-CM | POA: Diagnosis not present

## 2019-08-23 DIAGNOSIS — D649 Anemia, unspecified: Secondary | ICD-10-CM | POA: Insufficient documentation

## 2019-08-23 DIAGNOSIS — O99013 Anemia complicating pregnancy, third trimester: Secondary | ICD-10-CM | POA: Diagnosis not present

## 2019-08-23 DIAGNOSIS — F17210 Nicotine dependence, cigarettes, uncomplicated: Secondary | ICD-10-CM | POA: Diagnosis not present

## 2019-08-23 LAB — URINALYSIS, ROUTINE W REFLEX MICROSCOPIC
Bilirubin Urine: NEGATIVE
Glucose, UA: NEGATIVE mg/dL
Hgb urine dipstick: NEGATIVE
Ketones, ur: NEGATIVE mg/dL
Nitrite: NEGATIVE
Protein, ur: 30 mg/dL — AB
Specific Gravity, Urine: 1.031 — ABNORMAL HIGH (ref 1.005–1.030)
pH: 5 (ref 5.0–8.0)

## 2019-08-23 LAB — RAPID URINE DRUG SCREEN, HOSP PERFORMED
Amphetamines: NOT DETECTED
Barbiturates: NOT DETECTED
Benzodiazepines: NOT DETECTED
Cocaine: NOT DETECTED
Opiates: NOT DETECTED
Tetrahydrocannabinol: NOT DETECTED

## 2019-08-23 NOTE — MAU Note (Signed)
Tonya Davidson is a 28 y.o. at [redacted]w[redacted]d here in MAU reporting: back pain for 3 days, got worse today. Pain is mostly constant, states pain stops when she is walking. Took tylenol and ibuprofen with no relief. No bleeding or LOF. Having increased vaginal discharge, no odor or itching. +FM  Onset of complaint: 3 days  Pain score: 9/10  Vitals:   08/23/19 1725  BP: (!) 109/59  Pulse: (!) 101  Resp: 18  Temp: 98 F (36.7 C)  SpO2: 100%     FHT: +FM  Lab orders placed from triage: UA

## 2019-08-23 NOTE — MAU Provider Note (Signed)
Chief Complaint:  Back Pain and Vaginal Discharge   First Provider Initiated Contact with Patient 08/23/19 1745      HPI: Tonya Davidson is a 28 y.o. G1P0 at 267w2d with prenatal care at [facility] with documented hx chronic pain, fibromyalgia, anxiety, polysubstance abuse who presents to maternity admissions reporting back pain x 3 days. The pain is a constant stabbing pain on both sides of her low back.  There is associated mild abdominal cramping. There are no other symptoms. She has taken Tylenol and ibuprofen in the last 24 hours without relief.  Of note, she reports a positive COVID test with mild symptoms on 08/12/19.  She denies any symptoms for 5-6 days.    She reports good fetal movement.    HPI  Past Medical History: Past Medical History:  Diagnosis Date  . Abnormal Pap smear    colposcopy  . Anemia   . Anxiety   . Arthritis   . Bipolar 1 disorder (HCC)   . C. difficile diarrhea   . Chlamydia 04/29/2012  . Chronic pain   . Depression   . Drug-seeking behavior   . Fibromyalgia   . HPV in female   . IBS (irritable bowel syndrome)   . Interstitial cystitis   . Kidney stones   . Mononucleosis   . Ovarian cyst 01/2019   right   . Pelvic pain in female 12/14/2014  . PID (acute pelvic inflammatory disease) 04/28/2012  . Ulcer     Past obstetric history: OB History  Gravida Para Term Preterm AB Living  1            SAB TAB Ectopic Multiple Live Births               # Outcome Date GA Lbr Len/2nd Weight Sex Delivery Anes PTL Lv  1 Current             Past Surgical History: Past Surgical History:  Procedure Laterality Date  . COLPOSCOPY    . INNER EAR SURGERY    . KIDNEY STONE SURGERY    . LAPAROSCOPIC CHOLECYSTECTOMY    . TONSILLECTOMY    . WISDOM TOOTH EXTRACTION      Family History: Family History  Problem Relation Age of Onset  . Bipolar disorder Mother   . Cancer Father   . ADD / ADHD Brother   . Cancer Maternal Grandmother   . Cancer  Maternal Grandfather   . Cancer Paternal Grandmother   . Cancer Paternal Grandfather     Social History: Social History   Tobacco Use  . Smoking status: Current Every Day Smoker    Packs/day: 0.50    Years: 5.00    Pack years: 2.50    Types: Cigarettes  . Smokeless tobacco: Never Used  Substance Use Topics  . Alcohol use: No  . Drug use: Not Currently    Types: Marijuana    Comment: last used 3wks ago     Allergies:  Allergies  Allergen Reactions  . Azithromycin Other (See Comments)    GI upset and abdominal pain  . Doxycycline Nausea And Vomiting    Pt states she gets violently sick   . Prednisone Other (See Comments) and Anaphylaxis    Impacts Bipolar symptoms  "makes me crazy"  . Bactrim [Sulfamethoxazole-Trimethoprim] Swelling  . Flagyl [Metronidazole] Nausea And Vomiting  . Toradol [Ketorolac Tromethamine] Other (See Comments)    Makes arms tingly  . Morphine And Related Palpitations  Minor hives - pt is okay to take, hasn't needed benadryl in past    Meds:  No medications prior to admission.    ROS:  Review of Systems  Constitutional: Negative for chills, fatigue and fever.  Eyes: Negative for visual disturbance.  Respiratory: Negative for shortness of breath.   Cardiovascular: Negative for chest pain.  Gastrointestinal: Positive for abdominal pain. Negative for nausea and vomiting.  Genitourinary: Negative for difficulty urinating, dysuria, flank pain, pelvic pain, vaginal bleeding, vaginal discharge and vaginal pain.  Musculoskeletal: Positive for back pain.  Neurological: Negative for dizziness and headaches.  Psychiatric/Behavioral: Negative.      I have reviewed patient's Past Medical Hx, Surgical Hx, Family Hx, Social Hx, medications and allergies.   Physical Exam   Patient Vitals for the past 24 hrs:  BP Temp Temp src Pulse Resp SpO2 Height Weight  08/23/19 1725 (!) 109/59 98 F (36.7 C) Oral (!) 101 18 100 % - -  08/23/19 1722 - - - -  - - 5\' 4"  (1.626 m) 62.3 kg   Constitutional: Well-developed, well-nourished female in no acute distress.  Cardiovascular: normal rate Respiratory: normal effort GI: Abd soft, non-tender, gravid appropriate for gestational age.  MS: Extremities nontender, no edema, normal ROM Neurologic: Alert and oriented x 4.  GU: Mild CVA tenderness on left   Effacement (%): Thick Cervical Position: Posterior Exam by:: [email or web address], CNM  FHT:  Baseline 150, moderate variability, accelerations present, no decelerations Contractions: Rare, mild to palpation   Labs: Results for orders placed or performed during the hospital encounter of 08/23/19 (from the past 24 hour(s))  Urinalysis, Routine w reflex microscopic     Status: Abnormal   Collection Time: 08/23/19  5:32 PM  Result Value Ref Range   Color, Urine YELLOW YELLOW   APPearance CLOUDY (A) CLEAR   Specific Gravity, Urine 1.031 (H) 1.005 - 1.030   pH 5.0 5.0 - 8.0   Glucose, UA NEGATIVE NEGATIVE mg/dL   Hgb urine dipstick NEGATIVE NEGATIVE   Bilirubin Urine NEGATIVE NEGATIVE   Ketones, ur NEGATIVE NEGATIVE mg/dL   Protein, ur 30 (A) NEGATIVE mg/dL   Nitrite NEGATIVE NEGATIVE   Leukocytes,Ua TRACE (A) NEGATIVE   RBC / HPF 0-5 0 - 5 RBC/hpf   WBC, UA 6-10 0 - 5 WBC/hpf   Bacteria, UA RARE (A) NONE SEEN   Squamous Epithelial / LPF 11-20 0 - 5   Mucus PRESENT    Amorphous Crystal PRESENT   Urine rapid drug screen (hosp performed)not at [facility]     Status: None   Collection Time: 08/23/19  5:56 PM  Result Value Ref Range   Opiates NONE DETECTED NONE DETECTED   Cocaine NONE DETECTED NONE DETECTED   Benzodiazepines NONE DETECTED NONE DETECTED   Amphetamines NONE DETECTED NONE DETECTED   Tetrahydrocannabinol NONE DETECTED NONE DETECTED   Barbiturates NONE DETECTED NONE DETECTED      Imaging:  No results found.  MAU Course/MDM: Orders Placed This Encounter  Procedures  . Urinalysis, Routine w reflex microscopic  . Urine  rapid drug screen (hosp performed)not at [facility]    No orders of the defined types were placed in this encounter.    NST reviewed and reactive No acute abdomen, no CVA tenderness, pt with hx of chronic back pain  Cervix closed, no evidence of preterm labor CBC ordered   Pt requested pain medication.  With no evidence of UTI or hematuria suggestive of renal calculi, and no CVA tenderness or acute  abdomen on exam, offered pt heating pad and/or Tylenol.   Pt to contact her OB/Gyn provider or PCP to discuss back pain Pt left AMA prior to lab draw for CBC   Fatima Blank Certified Nurse-Midwife 08/24/2019 2:09 PM

## 2019-08-23 NOTE — MAU Note (Signed)
Pt pressed call light and requested an update on the POC. Informed pt that per Amedeo Gory CNM we are waiting on labs to be drawn/resulted and that she has been offered a heat pack for her back pain. Pt states that she could just take tylenol at home. Informed pt of rights to care and that she can sign out AMA at any time. Pt requesting to sign out AMA, form provided and this RN witnesses signature. Leftwich Baltazar Najjar CNM informed.

## 2019-09-12 ENCOUNTER — Encounter (HOSPITAL_COMMUNITY): Payer: Self-pay | Admitting: Family Medicine

## 2019-09-12 ENCOUNTER — Inpatient Hospital Stay (HOSPITAL_COMMUNITY)
Admission: AD | Admit: 2019-09-12 | Discharge: 2019-09-13 | Disposition: A | Discharge status: Home / Self Care | Payer: Medicaid Other | Source: Ambulatory Visit | Attending: Family Medicine | Admitting: Family Medicine

## 2019-09-12 ENCOUNTER — Other Ambulatory Visit: Payer: Self-pay

## 2019-09-12 DIAGNOSIS — O99333 Smoking (tobacco) complicating pregnancy, third trimester: Secondary | ICD-10-CM | POA: Insufficient documentation

## 2019-09-12 DIAGNOSIS — Z881 Allergy status to other antibiotic agents status: Secondary | ICD-10-CM | POA: Insufficient documentation

## 2019-09-12 DIAGNOSIS — Z885 Allergy status to narcotic agent status: Secondary | ICD-10-CM | POA: Insufficient documentation

## 2019-09-12 DIAGNOSIS — Z0371 Encounter for suspected problem with amniotic cavity and membrane ruled out: Secondary | ICD-10-CM | POA: Insufficient documentation

## 2019-09-12 DIAGNOSIS — F419 Anxiety disorder, unspecified: Secondary | ICD-10-CM | POA: Insufficient documentation

## 2019-09-12 DIAGNOSIS — Z809 Family history of malignant neoplasm, unspecified: Secondary | ICD-10-CM | POA: Insufficient documentation

## 2019-09-12 DIAGNOSIS — Z888 Allergy status to other drugs, medicaments and biological substances status: Secondary | ICD-10-CM | POA: Insufficient documentation

## 2019-09-12 DIAGNOSIS — Z3A35 35 weeks gestation of pregnancy: Secondary | ICD-10-CM | POA: Insufficient documentation

## 2019-09-12 DIAGNOSIS — G8929 Other chronic pain: Secondary | ICD-10-CM | POA: Insufficient documentation

## 2019-09-12 DIAGNOSIS — F319 Bipolar disorder, unspecified: Secondary | ICD-10-CM | POA: Insufficient documentation

## 2019-09-12 DIAGNOSIS — Z818 Family history of other mental and behavioral disorders: Secondary | ICD-10-CM | POA: Insufficient documentation

## 2019-09-12 DIAGNOSIS — F17210 Nicotine dependence, cigarettes, uncomplicated: Secondary | ICD-10-CM | POA: Insufficient documentation

## 2019-09-12 DIAGNOSIS — O99343 Other mental disorders complicating pregnancy, third trimester: Secondary | ICD-10-CM | POA: Insufficient documentation

## 2019-09-12 DIAGNOSIS — O99891 Other specified diseases and conditions complicating pregnancy: Secondary | ICD-10-CM | POA: Insufficient documentation

## 2019-09-12 DIAGNOSIS — O479 False labor, unspecified: Secondary | ICD-10-CM

## 2019-09-12 NOTE — MAU Note (Signed)
Patient reports to MAU stating a few days ago she was in the bathtub 2 nights ago and 30 min after she had a gush of fluid.   Pt reports cramping today. That goes from her lower back to her pelvis. Pt reports she goes to [facility] for care. Pt reports she has taken tylenol and a shower and is still having pain and they suggested she come in. +FM. No bleeding.   Pt is unsure why she is going to a high risk clinic but she thinks it is possibly due to a hx of seizures and kidney infections.   Pt reports taking 2 extra strength tylenol every 3-4 hours today pt reports doing this 4x.   108/64 BP 98Temp 94 Pulse  0 pain currently 7/10 when she ctx.

## 2019-09-13 DIAGNOSIS — Z888 Allergy status to other drugs, medicaments and biological substances status: Secondary | ICD-10-CM | POA: Diagnosis not present

## 2019-09-13 DIAGNOSIS — O99891 Other specified diseases and conditions complicating pregnancy: Secondary | ICD-10-CM | POA: Diagnosis not present

## 2019-09-13 DIAGNOSIS — F319 Bipolar disorder, unspecified: Secondary | ICD-10-CM | POA: Diagnosis not present

## 2019-09-13 DIAGNOSIS — Z881 Allergy status to other antibiotic agents status: Secondary | ICD-10-CM | POA: Diagnosis not present

## 2019-09-13 DIAGNOSIS — O99333 Smoking (tobacco) complicating pregnancy, third trimester: Secondary | ICD-10-CM | POA: Diagnosis not present

## 2019-09-13 DIAGNOSIS — Z3A35 35 weeks gestation of pregnancy: Secondary | ICD-10-CM

## 2019-09-13 DIAGNOSIS — F17210 Nicotine dependence, cigarettes, uncomplicated: Secondary | ICD-10-CM | POA: Diagnosis not present

## 2019-09-13 DIAGNOSIS — O4703 False labor before 37 completed weeks of gestation, third trimester: Secondary | ICD-10-CM | POA: Diagnosis not present

## 2019-09-13 DIAGNOSIS — O99343 Other mental disorders complicating pregnancy, third trimester: Secondary | ICD-10-CM | POA: Diagnosis not present

## 2019-09-13 DIAGNOSIS — Z0371 Encounter for suspected problem with amniotic cavity and membrane ruled out: Secondary | ICD-10-CM | POA: Diagnosis not present

## 2019-09-13 DIAGNOSIS — Z818 Family history of other mental and behavioral disorders: Secondary | ICD-10-CM | POA: Diagnosis not present

## 2019-09-13 DIAGNOSIS — F419 Anxiety disorder, unspecified: Secondary | ICD-10-CM | POA: Diagnosis not present

## 2019-09-13 DIAGNOSIS — G8929 Other chronic pain: Secondary | ICD-10-CM | POA: Diagnosis not present

## 2019-09-13 DIAGNOSIS — Z809 Family history of malignant neoplasm, unspecified: Secondary | ICD-10-CM | POA: Diagnosis not present

## 2019-09-13 DIAGNOSIS — Z885 Allergy status to narcotic agent status: Secondary | ICD-10-CM | POA: Diagnosis not present

## 2019-09-13 LAB — URINALYSIS, ROUTINE W REFLEX MICROSCOPIC
Bilirubin Urine: NEGATIVE
Glucose, UA: NEGATIVE mg/dL
Hgb urine dipstick: NEGATIVE
Ketones, ur: NEGATIVE mg/dL
Nitrite: NEGATIVE
Protein, ur: NEGATIVE mg/dL
Specific Gravity, Urine: 1.004 — ABNORMAL LOW (ref 1.005–1.030)
pH: 7 (ref 5.0–8.0)

## 2019-09-13 LAB — WET PREP, GENITAL
Clue Cells Wet Prep HPF POC: NONE SEEN
Sperm: NONE SEEN
Trich, Wet Prep: NONE SEEN
Yeast Wet Prep HPF POC: NONE SEEN

## 2019-09-13 LAB — POCT FERN TEST: POCT Fern Test: NEGATIVE

## 2019-09-13 NOTE — MAU Provider Note (Signed)
History     CSN: [id number]  Arrival date and time: 09/12/19 2310   First Provider Initiated Contact with Patient 09/13/19 0009      Chief Complaint  Patient presents with  . Contractions  . Rupture of Membranes   HPI  Tonya Davidson is a 28 yo G1P0 at [redacted]w[redacted]d EGA (with [facility] at [facility]) who is presenting today for contractions/cramping. She reports that they started today around 2 PM, and are irregular and sporadic, happening every 10-30 minutes. She has been taking tylenol for the pain, and has also tried taking a warm shower. She does say that she has not been drinking a lot of water recently.  She reports that a couple days ago, 30 minutes after a bath, she felt a large gush of fluid. It was clear, but she has not had leaking since then. She did change her underwear at that time, and the underwear remained dry. She has had a few episodes where she thinks that she had large amounts of vaginal discharge coming out.  She reports that she has not had sexual intercourse in 6-7 months. She denies any vaginal pain, bleeding, or dysuria. She is feeling her baby move.  OB History    Gravida  1   Para      Term      Preterm      AB      Living        SAB      TAB      Ectopic      Multiple      Live Births              Past Medical History:  Diagnosis Date  . Abnormal Pap smear    colposcopy  . Anemia   . Anxiety   . Arthritis   . Bipolar 1 disorder (HCC)   . C. difficile diarrhea   . Chlamydia 04/29/2012  . Chronic pain   . Depression   . Drug-seeking behavior   . Fibromyalgia   . HPV in female   . IBS (irritable bowel syndrome)   . Interstitial cystitis   . Kidney stones   . Mononucleosis   . Ovarian cyst 01/2019   right   . Pelvic pain in female 12/14/2014  . PID (acute pelvic inflammatory disease) 04/28/2012  . Ulcer     Past Surgical History:  Procedure Laterality Date  . COLPOSCOPY    . INNER EAR SURGERY    . KIDNEY STONE SURGERY    .  LAPAROSCOPIC CHOLECYSTECTOMY    . TONSILLECTOMY    . WISDOM TOOTH EXTRACTION      Family History  Problem Relation Age of Onset  . Bipolar disorder Mother   . Cancer Father   . ADD / ADHD Brother   . Cancer Maternal Grandmother   . Cancer Maternal Grandfather   . Cancer Paternal Grandmother   . Cancer Paternal Grandfather     Social History   Tobacco Use  . Smoking status: Current Every Day Smoker    Packs/day: 0.50    Years: 5.00    Pack years: 2.50    Types: Cigarettes  . Smokeless tobacco: Never Used  Substance Use Topics  . Alcohol use: No  . Drug use: Not Currently    Types: Marijuana    Comment: no tused for 1 year    Allergies:  Allergies  Allergen Reactions  . Azithromycin Other (See Comments)    GI upset and abdominal  pain  . Doxycycline Nausea And Vomiting    Pt states she gets violently sick   . Prednisone Other (See Comments) and Anaphylaxis    Impacts Bipolar symptoms  "makes me crazy"  . Bactrim [Sulfamethoxazole-Trimethoprim] Swelling  . Flagyl [Metronidazole] Nausea And Vomiting  . Toradol [Ketorolac Tromethamine] Other (See Comments)    Makes arms tingly  . Morphine And Related Palpitations    Minor hives - pt is okay to take, hasn't needed benadryl in past    Medications Prior to Admission  Medication Sig Dispense Refill Last Dose  . nitrofurantoin, macrocrystal-monohydrate, (MACROBID) 100 MG capsule Take 1 capsule (100 mg total) by mouth 2 (two) times daily. X 7 days 14 capsule 0 09/11/2019 at Unknown time  . Pediatric Multiple Vit-C-FA (FLINSTONES GUMMIES OMEGA-3 DHA) CHEW Chew 2 tablets by mouth daily.   09/12/2019 at Unknown time  . acetaminophen (TYLENOL) 325 MG tablet Take 2 tablets (650 mg total) by mouth every 6 (six) hours as needed for mild pain or moderate pain. (May buy over the counter) (Patient not taking: Reported on 03/28/2019) 1 tablet 0   . cephALEXin (KEFLEX) 500 MG capsule 1 cap po bid x 14 days (Patient not taking: Reported  on 07/25/2019) 28 capsule 0   . clonazePAM (KLONOPIN) 0.5 MG tablet Take 1 tablet (0.5 mg total) by mouth 2 (two) times daily as needed (anxiety or insomnia). (Patient not taking: Reported on 07/25/2019) 10 tablet 0   . dicyclomine (BENTYL) 20 MG tablet Take 1 tablet (20 mg total) by mouth 2 (two) times daily. (Patient not taking: Reported on 03/28/2019) 20 tablet 0   . Doxylamine-Pyridoxine 10-10 MG TBEC Take 2 tablets by mouth at bedtime as needed (for nausea). (Patient not taking: Reported on 07/25/2019) 20 tablet 0   . Elastic Bandages & Supports (COMFORT FIT MATERNITY SUPP SM) MISC 1 Units by Does not apply route daily as needed. 1 each 0   . hydrOXYzine (ATARAX/VISTARIL) 25 MG tablet Take 1 tablet (25 mg total) by mouth every 6 (six) hours as needed (anxiety/agitation or CIWA < or = 10). (Patient not taking: Reported on 03/28/2019) 30 tablet 0   . lidocaine (LIDODERM) 5 % Place 1 patch onto the skin daily. Remove & Discard patch within 12 hours or as directed by MD 10 patch 0   . ondansetron (ZOFRAN ODT) 4 MG disintegrating tablet Take 1 tablet (4 mg total) by mouth every 8 (eight) hours as needed for nausea. (Patient not taking: Reported on 03/28/2019) 10 tablet 0   . simethicone (GAS-X) 80 MG chewable tablet Chew 1 tablet (80 mg total) by mouth every 6 (six) hours as needed for flatulence. (Patient not taking: Reported on 03/28/2019) 30 tablet 0     Review of Systems  All other systems reviewed and are negative.  Physical Exam   Blood pressure 108/64, pulse 94, temperature 98 F (36.7 C), temperature source Oral, resp. rate 19, last menstrual period 12/01/2018.  Physical Exam  Nursing note and vitals reviewed. Constitutional: She is oriented to person, place, and time. She appears well-developed and well-nourished.  No facial grimacing or visible signs of pain with uterine contractions on TOCO  HENT:  Head: Normocephalic and atraumatic.  Eyes: Pupils are equal, round, and reactive to light.  Conjunctivae and EOM are normal.  Cardiovascular: Normal rate, regular rhythm and normal heart sounds.  Respiratory: Effort normal and breath sounds normal.  GI: Soft. She exhibits no distension. There is no abdominal tenderness. There is no  rebound and no guarding.  Gravid, cephalic presentation  Genitourinary:    Genitourinary Comments: Thick white discharge in the vaginal vault. Normal mucosa. No pooling on exam. Initial SVE: FT/thick/-3   Musculoskeletal:        General: Normal range of motion.     Cervical back: Normal range of motion and neck supple.  Neurological: She is alert and oriented to person, place, and time. She has normal reflexes.  Skin: Skin is warm and dry.  Psychiatric: She has a normal mood and affect. Her behavior is normal. Judgment and thought content normal.    MAU Course  Procedures  MDM -no pooling on sterile speculum exam, fern negative -r/o preterm labor -GC/CT, wet prep, UA  NST -baseline: 135 -variability: moderate -accels: 15x15 -decels: absent -interpretation: reactive TOCO: irregular CTX with baseline uterine irritability, 7-15 minutes apart  Results for orders placed or performed during the hospital encounter of 09/12/19 (from the past 24 hour(s))  Urinalysis, Routine w reflex microscopic     Status: Abnormal   Collection Time: 09/13/19 12:13 AM  Result Value Ref Range   Color, Urine STRAW (A) YELLOW   APPearance HAZY (A) CLEAR   Specific Gravity, Urine 1.004 (L) 1.005 - 1.030   pH 7.0 5.0 - 8.0   Glucose, UA NEGATIVE NEGATIVE mg/dL   Hgb urine dipstick NEGATIVE NEGATIVE   Bilirubin Urine NEGATIVE NEGATIVE   Ketones, ur NEGATIVE NEGATIVE mg/dL   Protein, ur NEGATIVE NEGATIVE mg/dL   Nitrite NEGATIVE NEGATIVE   Leukocytes,Ua MODERATE (A) NEGATIVE   RBC / HPF 0-5 0 - 5 RBC/hpf   WBC, UA 11-20 0 - 5 WBC/hpf   Bacteria, UA MANY (A) NONE SEEN   Squamous Epithelial / LPF 11-20 0 - 5   Mucus PRESENT   Wet prep, genital     Status:  Abnormal   Collection Time: 09/13/19 12:26 AM   Specimen: PATH Cytology Cervicovaginal Ancillary Only  Result Value Ref Range   Yeast Wet Prep HPF POC NONE SEEN NONE SEEN   Trich, Wet Prep NONE SEEN NONE SEEN   Clue Cells Wet Prep HPF POC NONE SEEN NONE SEEN   WBC, Wet Prep HPF POC MANY (A) NONE SEEN   Sperm NONE SEEN    Recheck 0130: -SVE: FT/Thick/-3 -no cervical change -uterine CTX sporadic and irregular, most likely [facility] as patient only feels mild cramping.  Assessment and Plan  28 yo G1P0 at [redacted]w[redacted]d EGA c/o leakage of fluid and contractions -Not in labor -Fern/pooling negative, membranes intact -Most likely Braxton Hicks contractions, contractions did settle down once she started drinking water -DC to home with return precautions   Crispin Vogel L [place] 09/13/2019, 12:25 AM

## 2019-09-13 NOTE — Discharge Instructions (Signed)
Braxton Hicks Contractions Contractions of the uterus can occur throughout pregnancy, but they are not always a sign that you are in labor. You may have practice contractions called Braxton Hicks contractions. These false labor contractions are sometimes confused with true labor. What are Braxton Hicks contractions? Braxton Hicks contractions are tightening movements that occur in the muscles of the uterus before labor. Unlike true labor contractions, these contractions do not result in opening (dilation) and thinning of the cervix. Toward the end of pregnancy (32-34 weeks), Braxton Hicks contractions can happen more often and may become stronger. These contractions are sometimes difficult to tell apart from true labor because they can be very uncomfortable. You should not feel embarrassed if you go to the hospital with false labor. Sometimes, the only way to tell if you are in true labor is for your health care provider to look for changes in the cervix. The health care provider will do a physical exam and may monitor your contractions. If you are not in true labor, the exam should show that your cervix is not dilating and your water has not broken. If there are no other health problems associated with your pregnancy, it is completely safe for you to be sent home with false labor. You may continue to have Braxton Hicks contractions until you go into true labor. How to tell the difference between true labor and false labor True labor  Contractions last 30-70 seconds.  Contractions become very regular.  Discomfort is usually felt in the top of the uterus, and it spreads to the lower abdomen and low back.  Contractions do not go away with walking.  Contractions usually become more intense and increase in frequency.  The cervix dilates and gets thinner. False labor  Contractions are usually shorter and not as strong as true labor contractions.  Contractions are usually irregular.  Contractions  are often felt in the front of the lower abdomen and in the groin.  Contractions may go away when you walk around or change positions while lying down.  Contractions get weaker and are shorter-lasting as time goes on.  The cervix usually does not dilate or become thin. Follow these instructions at home:   Take over-the-counter and prescription medicines only as told by your health care provider.  Keep up with your usual exercises and follow other instructions from your health care provider.  Eat and drink lightly if you think you are going into labor.  If Braxton Hicks contractions are making you uncomfortable: ? Change your position from lying down or resting to walking, or change from walking to resting. ? Sit and rest in a tub of warm water. ? Drink enough fluid to keep your urine pale yellow. Dehydration may cause these contractions. ? Do slow and deep breathing several times an hour.  Keep all follow-up prenatal visits as told by your health care provider. This is important. Contact a health care provider if:  You have a fever.  You have continuous pain in your abdomen. Get help right away if:  Your contractions become stronger, more regular, and closer together.  You have fluid leaking or gushing from your vagina.  You pass blood-tinged mucus (bloody show).  You have bleeding from your vagina.  You have low back pain that you never had before.  You feel your baby's head pushing down and causing pelvic pressure.  Your baby is not moving inside you as much as it used to. Summary  Contractions that occur before labor are   called Braxton Hicks contractions, false labor, or practice contractions.  Braxton Hicks contractions are usually shorter, weaker, farther apart, and less regular than true labor contractions. True labor contractions usually become progressively stronger and regular, and they become more frequent.  Manage discomfort from [facility] contractions  by changing position, resting in a warm bath, drinking plenty of water, or practicing deep breathing. This information is not intended to replace advice given to you by your health care provider. Make sure you discuss any questions you have with your health care provider. Document Released: 01/18/2017 Document Revised: 08/17/2017 Document Reviewed: 01/18/2017 Elsevier Patient Education  [employer].   Signs and Symptoms of Labor Labor is your body's natural process of moving your baby, placenta, and umbilical cord out of your uterus. The process of labor usually starts when your baby is full-term, between 61 and 40 weeks of pregnancy. How will I know when I am close to going into labor? As your body prepares for labor and the birth of your baby, you may notice the following symptoms in the weeks and days before true labor starts:  Having a strong desire to get your home ready to receive your new baby. This is called nesting. Nesting may be a sign that labor is approaching, and it may occur several weeks before birth. Nesting may involve cleaning and organizing your home.  Passing a small amount of thick, bloody mucus out of your vagina (normal bloody show or losing your mucus plug). This may happen more than a week before labor begins, or it might occur right before labor begins as the opening of the cervix starts to widen (dilate). For some women, the entire mucus plug passes at once. For others, smaller portions of the mucus plug may gradually pass over several days.  Your baby moving (dropping) lower in your pelvis to get into position for birth (lightening). When this happens, you may feel more pressure on your bladder and pelvic bone and less pressure on your ribs. This may make it easier to breathe. It may also cause you to need to urinate more often and have problems with bowel movements.  Having "practice contractions" (Braxton Hicks contractions) that occur at irregular (unevenly  spaced) intervals that are more than 10 minutes apart. This is also called false labor. False labor contractions are common after exercise or sexual activity, and they will stop if you change position, rest, or drink fluids. These contractions are usually mild and do not get stronger over time. They may feel like: ? A backache or back pain. ? Mild cramps, similar to menstrual cramps. ? Tightening or pressure in your abdomen. Other early symptoms that labor may be starting soon include:  Nausea or loss of appetite.  Diarrhea.  Having a sudden burst of energy, or feeling very tired.  Mood changes.  Having trouble sleeping. How will I know when labor has begun? Signs that true labor has begun may include:  Having contractions that come at regular (evenly spaced) intervals and increase in intensity. This may feel like more intense tightening or pressure in your abdomen that moves to your back. ? Contractions may also feel like rhythmic pain in your upper thighs or back that comes and goes at regular intervals. ? For first-time mothers, this change in intensity of contractions often occurs at a more gradual pace. ? Women who have given birth before may notice a more rapid progression of contraction changes.  Having a feeling of pressure in the vaginal  area.  Your water breaking (rupture of membranes). This is when the sac of fluid that surrounds your baby breaks. When this happens, you will notice fluid leaking from your vagina. This may be clear or blood-tinged. Labor usually starts within 24 hours of your water breaking, but it may take longer to begin. ? Some women notice this as a gush of fluid. ? Others notice that their underwear repeatedly becomes damp. Follow these instructions at home:   When labor starts, or if your water breaks, call your health care provider or nurse care line. Based on your situation, they will determine when you should go in for an exam.  When you are in  early labor, you may be able to rest and manage symptoms at home. Some strategies to try at home include: ? Breathing and relaxation techniques. ? Taking a warm bath or shower. ? Listening to music. ? Using a heating pad on the lower back for pain. If you are directed to use heat:  Place a towel between your skin and the heat source.  Leave the heat on for 20-30 minutes.  Remove the heat if your skin turns bright red. This is especially important if you are unable to feel pain, heat, or cold. You may have a greater risk of getting burned. Get help right away if:  You have painful, regular contractions that are 5 minutes apart or less.  Labor starts before you are [redacted] weeks along in your pregnancy.  You have a fever.  You have a headache that does not go away.  You have bright red blood coming from your vagina.  You do not feel your baby moving.  You have a sudden onset of: ? Severe headache with vision problems. ? Nausea, vomiting, or diarrhea. ? Chest pain or shortness of breath. These symptoms may be an emergency. If your health care provider recommends that you go to the hospital or birth center where you plan to deliver, do not drive yourself. Have someone else drive you, or call emergency services (911 in the [place] Summary  Labor is your body's natural process of moving your baby, placenta, and umbilical cord out of your uterus.  The process of labor usually starts when your baby is full-term, between 29 and 40 weeks of pregnancy.  When labor starts, or if your water breaks, call your health care provider or nurse care line. Based on your situation, they will determine when you should go in for an exam. This information is not intended to replace advice given to you by your health care provider. Make sure you discuss any questions you have with your health care provider. Document Released: 02/09/2017 Document Revised: 06/04/2017 Document Reviewed: 02/09/2017 Elsevier  Patient Education  2020 [employer].

## 2019-09-14 LAB — CULTURE, OB URINE: Culture: NO GROWTH

## 2019-09-15 LAB — GC/CHLAMYDIA PROBE AMP (~~LOC~~) NOT AT ARMC
Chlamydia: NEGATIVE
Comment: NEGATIVE
Comment: NORMAL
Neisseria Gonorrhea: NEGATIVE

## 2019-09-21 ENCOUNTER — Inpatient Hospital Stay (HOSPITAL_COMMUNITY)
Admission: AD | Admit: 2019-09-21 | Discharge: 2019-09-21 | Disposition: A | Discharge status: Home / Self Care | Payer: Medicaid Other | Attending: Obstetrics and Gynecology | Admitting: Obstetrics and Gynecology

## 2019-09-21 ENCOUNTER — Other Ambulatory Visit: Payer: Self-pay

## 2019-09-21 ENCOUNTER — Encounter (HOSPITAL_COMMUNITY): Payer: Self-pay | Admitting: Obstetrics and Gynecology

## 2019-09-21 DIAGNOSIS — Z3A36 36 weeks gestation of pregnancy: Secondary | ICD-10-CM

## 2019-09-21 DIAGNOSIS — R519 Headache, unspecified: Secondary | ICD-10-CM | POA: Diagnosis not present

## 2019-09-21 DIAGNOSIS — Z881 Allergy status to other antibiotic agents status: Secondary | ICD-10-CM | POA: Insufficient documentation

## 2019-09-21 DIAGNOSIS — Z885 Allergy status to narcotic agent status: Secondary | ICD-10-CM | POA: Insufficient documentation

## 2019-09-21 DIAGNOSIS — O99891 Other specified diseases and conditions complicating pregnancy: Secondary | ICD-10-CM | POA: Diagnosis not present

## 2019-09-21 DIAGNOSIS — M549 Dorsalgia, unspecified: Secondary | ICD-10-CM | POA: Diagnosis not present

## 2019-09-21 DIAGNOSIS — O99333 Smoking (tobacco) complicating pregnancy, third trimester: Secondary | ICD-10-CM | POA: Insufficient documentation

## 2019-09-21 DIAGNOSIS — O26893 Other specified pregnancy related conditions, third trimester: Secondary | ICD-10-CM | POA: Diagnosis not present

## 2019-09-21 DIAGNOSIS — Z888 Allergy status to other drugs, medicaments and biological substances status: Secondary | ICD-10-CM | POA: Diagnosis not present

## 2019-09-21 DIAGNOSIS — F17210 Nicotine dependence, cigarettes, uncomplicated: Secondary | ICD-10-CM | POA: Insufficient documentation

## 2019-09-21 DIAGNOSIS — R609 Edema, unspecified: Secondary | ICD-10-CM | POA: Diagnosis present

## 2019-09-21 LAB — COMPREHENSIVE METABOLIC PANEL
ALT: 14 U/L (ref 0–44)
AST: 18 U/L (ref 15–41)
Albumin: 2.8 g/dL — ABNORMAL LOW (ref 3.5–5.0)
Alkaline Phosphatase: 172 U/L — ABNORMAL HIGH (ref 38–126)
Anion gap: 9 (ref 5–15)
BUN: 7 mg/dL (ref 6–20)
CO2: 22 mmol/L (ref 22–32)
Calcium: 8.3 mg/dL — ABNORMAL LOW (ref 8.9–10.3)
Chloride: 105 mmol/L (ref 98–111)
Creatinine, Ser: 0.63 mg/dL (ref 0.44–1.00)
GFR calc Af Amer: >60 mL/min (ref >60)
GFR calc non Af Amer: >60 mL/min (ref >60)
Glucose, Bld: 75 mg/dL (ref 70–99)
Potassium: 3.8 mmol/L (ref 3.5–5.1)
Sodium: 136 mmol/L (ref 135–145)
Total Bilirubin: 0.3 mg/dL (ref 0.3–1.2)
Total Protein: 5.9 g/dL — ABNORMAL LOW (ref 6.5–8.1)

## 2019-09-21 LAB — URINALYSIS, ROUTINE W REFLEX MICROSCOPIC
Bilirubin Urine: NEGATIVE
Glucose, UA: NEGATIVE mg/dL
Hgb urine dipstick: NEGATIVE
Ketones, ur: NEGATIVE mg/dL
Nitrite: NEGATIVE
Protein, ur: 30 mg/dL — AB
Specific Gravity, Urine: 1.018 (ref 1.005–1.030)
pH: 6 (ref 5.0–8.0)

## 2019-09-21 LAB — CBC
HCT: 34.2 % — ABNORMAL LOW (ref 36.0–46.0)
Hemoglobin: 11.4 g/dL — ABNORMAL LOW (ref 12.0–15.0)
MCH: 33.4 pg (ref 26.0–34.0)
MCHC: 33.3 g/dL (ref 30.0–36.0)
MCV: 100.3 fL — ABNORMAL HIGH (ref 80.0–100.0)
Platelets: 318 10*3/uL (ref 150–400)
RBC: 3.41 MIL/uL — ABNORMAL LOW (ref 3.87–5.11)
RDW: 11.9 % (ref 11.5–15.5)
WBC: 12.3 10*3/uL — ABNORMAL HIGH (ref 4.0–10.5)
nRBC: 0 % (ref 0.0–0.2)

## 2019-09-21 LAB — PROTEIN / CREATININE RATIO, URINE
Creatinine, Urine: 110.87 mg/dL
Protein Creatinine Ratio: 0.22 mg/mg{Cre} — ABNORMAL HIGH (ref 0.00–0.15)
Total Protein, Urine: 24 mg/dL

## 2019-09-21 MED ORDER — ACETAMINOPHEN 325 MG PO TABS
650.0000 mg | ORAL_TABLET | Freq: Once | ORAL | Status: AC
  Administered 2019-09-21: 650 mg via ORAL
  Filled 2019-09-21: qty 2

## 2019-09-21 MED ORDER — CYCLOBENZAPRINE HCL 10 MG PO TABS: 10.0000 mg | ORAL_TABLET | Freq: Three times a day (TID) | ORAL | 0 refills | Status: DC | PRN

## 2019-09-21 MED ORDER — CYCLOBENZAPRINE HCL 10 MG PO TABS
5.0000 mg | ORAL_TABLET | Freq: Once | ORAL | Status: AC
  Administered 2019-09-21: 5 mg via ORAL
  Filled 2019-09-21: qty 1

## 2019-09-21 NOTE — MAU Provider Note (Signed)
History     CSN: [id number]  Arrival date and time: 09/21/19 2001   First Provider Initiated Contact with Patient 09/21/19 2040      Chief Complaint  Patient presents with  . swelling hands, feet, legs, ankles  . Headache   HPI  Tonya Davidson is a 29 yo G1P0 at [redacted]w[redacted]d EGA (with [facility] at [facility]- has not been to an appointment in 3 weeks due to transportation issues) who is presenting today for a headache and intermittent leg and wrist swelling. She notices swelling in her legs after working on her feet all day, and yesterday noticed some swelling of her wrists, which has since resolved. She denies any numbness or tingling. When she contacted her OB office, they were concerned for pre-eclampsia, so she was referred here.  She denies SOB, RUQ pain, or vision changes. Her headache has improved with tylenol.  She also reports right-sided back pain, especially noticeable when she is at work and on her feet. She reports it feels tight, like she pulled a muscle. She denies dysuria, frequency, urgency, or hematuria.  She also reports occasional Braxton-Hicks contractions.  OB History    Gravida  1   Para      Term      Preterm      AB      Living        SAB      TAB      Ectopic      Multiple      Live Births              Past Medical History:  Diagnosis Date  . Abnormal Pap smear    colposcopy  . Anemia   . Anxiety   . Arthritis   . Bipolar 1 disorder (HCC)   . C. difficile diarrhea   . Chlamydia 04/29/2012  . Chronic pain   . Depression   . Drug-seeking behavior   . Fibromyalgia   . HPV in female   . IBS (irritable bowel syndrome)   . Interstitial cystitis   . Kidney stones   . Mononucleosis   . Ovarian cyst 01/2019   right   . Pelvic pain in female 12/14/2014  . PID (acute pelvic inflammatory disease) 04/28/2012  . Ulcer     Past Surgical History:  Procedure Laterality Date  . COLPOSCOPY    . INNER EAR SURGERY    . KIDNEY STONE SURGERY    .  LAPAROSCOPIC CHOLECYSTECTOMY    . TONSILLECTOMY    . WISDOM TOOTH EXTRACTION      Family History  Problem Relation Age of Onset  . Bipolar disorder Mother   . Cancer Father   . ADD / ADHD Brother   . Cancer Maternal Grandmother   . Cancer Maternal Grandfather   . Cancer Paternal Grandmother   . Cancer Paternal Grandfather     Social History   Tobacco Use  . Smoking status: Current Every Day Smoker    Packs/day: 0.50    Years: 5.00    Pack years: 2.50    Types: Cigarettes  . Smokeless tobacco: Never Used  Substance Use Topics  . Alcohol use: No  . Drug use: Not Currently    Types: Marijuana    Comment: no tused for 1 year    Allergies:  Allergies  Allergen Reactions  . Azithromycin Other (See Comments)    GI upset and abdominal pain  . Doxycycline Nausea And Vomiting    Pt states  she gets violently sick   . Prednisone Other (See Comments) and Anaphylaxis    Impacts Bipolar symptoms  "makes me crazy"  . Bactrim [Sulfamethoxazole-Trimethoprim] Swelling  . Flagyl [Metronidazole] Nausea And Vomiting  . Toradol [Ketorolac Tromethamine] Other (See Comments)    Makes arms tingly  . Morphine And Related Palpitations    Minor hives - pt is okay to take, hasn't needed benadryl in past    Medications Prior to Admission  Medication Sig Dispense Refill Last Dose  . acetaminophen (TYLENOL) 325 MG tablet Take 2 tablets (650 mg total) by mouth every 6 (six) hours as needed for mild pain or moderate pain. (May buy over the counter) (Patient not taking: Reported on 03/28/2019) 1 tablet 0   . cephALEXin (KEFLEX) 500 MG capsule 1 cap po bid x 14 days (Patient not taking: Reported on 07/25/2019) 28 capsule 0   . clonazePAM (KLONOPIN) 0.5 MG tablet Take 1 tablet (0.5 mg total) by mouth 2 (two) times daily as needed (anxiety or insomnia). (Patient not taking: Reported on 07/25/2019) 10 tablet 0   . dicyclomine (BENTYL) 20 MG tablet Take 1 tablet (20 mg total) by mouth 2 (two) times daily.  (Patient not taking: Reported on 03/28/2019) 20 tablet 0   . Doxylamine-Pyridoxine 10-10 MG TBEC Take 2 tablets by mouth at bedtime as needed (for nausea). (Patient not taking: Reported on 07/25/2019) 20 tablet 0   . Elastic Bandages & Supports (COMFORT FIT MATERNITY SUPP SM) MISC 1 Units by Does not apply route daily as needed. 1 each 0   . hydrOXYzine (ATARAX/VISTARIL) 25 MG tablet Take 1 tablet (25 mg total) by mouth every 6 (six) hours as needed (anxiety/agitation or CIWA < or = 10). (Patient not taking: Reported on 03/28/2019) 30 tablet 0   . lidocaine (LIDODERM) 5 % Place 1 patch onto the skin daily. Remove & Discard patch within 12 hours or as directed by MD 10 patch 0   . nitrofurantoin, macrocrystal-monohydrate, (MACROBID) 100 MG capsule Take 1 capsule (100 mg total) by mouth 2 (two) times daily. X 7 days 14 capsule 0   . ondansetron (ZOFRAN ODT) 4 MG disintegrating tablet Take 1 tablet (4 mg total) by mouth every 8 (eight) hours as needed for nausea. (Patient not taking: Reported on 03/28/2019) 10 tablet 0   . Pediatric Multiple Vit-C-FA (FLINSTONES GUMMIES OMEGA-3 DHA) CHEW Chew 2 tablets by mouth daily.     . simethicone (GAS-X) 80 MG chewable tablet Chew 1 tablet (80 mg total) by mouth every 6 (six) hours as needed for flatulence. (Patient not taking: Reported on 03/28/2019) 30 tablet 0     Review of Systems  All other systems reviewed and are negative.  Physical Exam   Blood pressure 102/61, pulse 88, temperature 98.2 F (36.8 C), temperature source Oral, resp. rate 16, last menstrual period 12/01/2018.  Physical Exam  Nursing note and vitals reviewed. Constitutional: She is oriented to person, place, and time. She appears well-developed and well-nourished.  HENT:  Head: Normocephalic and atraumatic.  Eyes: Pupils are equal, round, and reactive to light. Conjunctivae and EOM are normal.  Cardiovascular: Normal rate, regular rhythm, normal heart sounds and intact distal pulses.   Respiratory: Effort normal and breath sounds normal.  GI: Soft. Bowel sounds are normal. She exhibits no distension and no mass. There is no abdominal tenderness. There is no rebound and no guarding.  Gravid  Musculoskeletal:        General: No edema. Normal range of motion.  Cervical back: Normal range of motion and neck supple.     Comments: No swelling in all four extremities. Hypertonicity of the right quadratus lumborum palpated.  Neurological: She is alert and oriented to person, place, and time. She has normal reflexes.  Skin: Skin is warm and dry.  Psychiatric: She has a normal mood and affect. Her behavior is normal. Judgment and thought content normal.    MAU Course  Procedures  MDM -BP normal, but due to headache will rule/out pre-eclampsia -CMP, CBC, P:Cr -Back pain most likely musculoskeletal- UA to r/o infection  NST -baseline: 135 -variability: moderate -accels: 15x15 -decels: absent -interpretation: reactive  Results for orders placed or performed during the hospital encounter of 09/21/19 (from the past 24 hour(s))  Urinalysis, Routine w reflex microscopic     Status: Abnormal   Collection Time: 09/21/19  8:38 PM  Result Value Ref Range   Color, Urine YELLOW YELLOW   APPearance CLOUDY (A) CLEAR   Specific Gravity, Urine 1.018 1.005 - 1.030   pH 6.0 5.0 - 8.0   Glucose, UA NEGATIVE NEGATIVE mg/dL   Hgb urine dipstick NEGATIVE NEGATIVE   Bilirubin Urine NEGATIVE NEGATIVE   Ketones, ur NEGATIVE NEGATIVE mg/dL   Protein, ur 30 (A) NEGATIVE mg/dL   Nitrite NEGATIVE NEGATIVE   Leukocytes,Ua LARGE (A) NEGATIVE   RBC / HPF 0-5 0 - 5 RBC/hpf   WBC, UA 11-20 0 - 5 WBC/hpf   Bacteria, UA MANY (A) NONE SEEN   Squamous Epithelial / LPF 21-50 0 - 5   Mucus PRESENT   Protein / creatinine ratio, urine     Status: Abnormal   Collection Time: 09/21/19  8:38 PM  Result Value Ref Range   Creatinine, Urine 110.87 mg/dL   Total Protein, Urine 24 mg/dL   Protein  Creatinine Ratio 0.22 (H) 0.00 - 0.15 mg/mg[Cre]  Comprehensive metabolic panel     Status: Abnormal   Collection Time: 09/21/19  8:54 PM  Result Value Ref Range   Sodium 136 135 - 145 mmol/L   Potassium 3.8 3.5 - 5.1 mmol/L   Chloride 105 98 - 111 mmol/L   CO2 22 22 - 32 mmol/L   Glucose, Bld 75 70 - 99 mg/dL   BUN 7 6 - 20 mg/dL   Creatinine, Ser 0.63 0.44 - 1.00 mg/dL   Calcium 8.3 (L) 8.9 - 10.3 mg/dL   Total Protein 5.9 (L) 6.5 - 8.1 g/dL   Albumin 2.8 (L) 3.5 - 5.0 g/dL   AST 18 15 - 41 U/L   ALT 14 0 - 44 U/L   Alkaline Phosphatase 172 (H) 38 - 126 U/L   Total Bilirubin 0.3 0.3 - 1.2 mg/dL   GFR calc non Af Amer >60 >60 mL/min   GFR calc Af Amer >60 >60 mL/min   Anion gap 9 5 - 15  CBC     Status: Abnormal   Collection Time: 09/21/19  8:54 PM  Result Value Ref Range   WBC 12.3 (H) 4.0 - 10.5 K/uL   RBC 3.41 (L) 3.87 - 5.11 MIL/uL   Hemoglobin 11.4 (L) 12.0 - 15.0 g/dL   HCT 34.2 (L) 36.0 - 46.0 %   MCV 100.3 (H) 80.0 - 100.0 fL   MCH 33.4 26.0 - 34.0 pg   MCHC 33.3 30.0 - 36.0 g/dL   RDW 11.9 11.5 - 15.5 %   Platelets 318 150 - 400 K/uL   nRBC 0.0 0.0 - 0.2 %   REASSESSMENT: -HA  resolved -Back pain improved  Assessment and Plan  29 yo G1P0 at 33.3 EGA with back pain and HA -back pain most likely MSK in nature- flexeril sent -HA resolved with tylenol -Labwork reassuring, patient does not have Pre-E -UA shows some bacteria but no nitrites- patient recently on antibiotics, so will send for culture  -DC home with return precautions -Advised to follow up with [facility] for her care  Kieley Akter L Yuriana Gaal 09/21/2019, 8:58 PM

## 2019-09-21 NOTE — Discharge Instructions (Signed)

## 2019-09-21 NOTE — MAU Note (Signed)
Patient states " I should probably just leave. I think I'm over reacting. I hate [facility]."

## 2019-09-21 NOTE — MAU Note (Addendum)
Patient presents to MAU c/o dull headache. Denies visual disturbances. Denies feeling SOB +FM, denies vaginal bleeding or LOF.  Patient states " I just feel weird, I have back pain on the right side and I'm cramping." Patient reports taking Tylenol and "it hasn't done anything." Patient reports swollen feet/ankles/hands. No swelling noted upon examination.

## 2019-09-23 LAB — CULTURE, OB URINE: Culture: <10000 — AB

## 2019-10-20 ENCOUNTER — Ambulatory Visit: Payer: Medicaid Other | Attending: Internal Medicine

## 2019-10-20 DIAGNOSIS — Z20822 Contact with and (suspected) exposure to covid-19: Secondary | ICD-10-CM

## 2019-10-21 LAB — NOVEL CORONAVIRUS, NAA: SARS-CoV-2, NAA: NOT DETECTED

## 2019-12-01 ENCOUNTER — Other Ambulatory Visit: Payer: Self-pay

## 2019-12-01 ENCOUNTER — Encounter (HOSPITAL_COMMUNITY): Payer: Self-pay

## 2019-12-01 ENCOUNTER — Emergency Department (HOSPITAL_COMMUNITY)
Admission: EM | Admit: 2019-12-01 | Discharge: 2019-12-02 | Disposition: A | Discharge status: Home / Self Care | Payer: Medicaid Other | Attending: Emergency Medicine | Admitting: Emergency Medicine

## 2019-12-01 DIAGNOSIS — Z79899 Other long term (current) drug therapy: Secondary | ICD-10-CM | POA: Insufficient documentation

## 2019-12-01 DIAGNOSIS — R519 Headache, unspecified: Secondary | ICD-10-CM | POA: Insufficient documentation

## 2019-12-01 DIAGNOSIS — F17210 Nicotine dependence, cigarettes, uncomplicated: Secondary | ICD-10-CM | POA: Diagnosis not present

## 2019-12-01 NOTE — ED Triage Notes (Signed)
Recently finished abx tx for dental abscess.

## 2019-12-02 ENCOUNTER — Emergency Department (HOSPITAL_COMMUNITY): Payer: Medicaid Other

## 2019-12-02 ENCOUNTER — Encounter (HOSPITAL_COMMUNITY): Payer: Self-pay

## 2019-12-02 LAB — CBC WITH DIFFERENTIAL/PLATELET
Abs Immature Granulocytes: 0.01 10*3/uL (ref 0.00–0.07)
Basophils Absolute: 0.1 10*3/uL (ref 0.0–0.1)
Basophils Relative: 1 %
Eosinophils Absolute: 0.2 10*3/uL (ref 0.0–0.5)
Eosinophils Relative: 3 %
HCT: 41.3 % (ref 36.0–46.0)
Hemoglobin: 13.4 g/dL (ref 12.0–15.0)
Immature Granulocytes: 0 %
Lymphocytes Relative: 37 %
Lymphs Abs: 2.7 10*3/uL (ref 0.7–4.0)
MCH: 32.2 pg (ref 26.0–34.0)
MCHC: 32.4 g/dL (ref 30.0–36.0)
MCV: 99.3 fL (ref 80.0–100.0)
Monocytes Absolute: 0.5 10*3/uL (ref 0.1–1.0)
Monocytes Relative: 6 %
Neutro Abs: 3.9 10*3/uL (ref 1.7–7.7)
Neutrophils Relative %: 53 %
Platelets: 328 10*3/uL (ref 150–400)
RBC: 4.16 MIL/uL (ref 3.87–5.11)
RDW: 11.4 % — ABNORMAL LOW (ref 11.5–15.5)
WBC: 7.3 10*3/uL (ref 4.0–10.5)
nRBC: 0 % (ref 0.0–0.2)

## 2019-12-02 LAB — BASIC METABOLIC PANEL
Anion gap: 7 (ref 5–15)
BUN: 17 mg/dL (ref 6–20)
CO2: 27 mmol/L (ref 22–32)
Calcium: 9.4 mg/dL (ref 8.9–10.3)
Chloride: 104 mmol/L (ref 98–111)
Creatinine, Ser: 0.72 mg/dL (ref 0.44–1.00)
GFR calc Af Amer: >60 mL/min (ref >60)
GFR calc non Af Amer: >60 mL/min (ref >60)
Glucose, Bld: 94 mg/dL (ref 70–99)
Potassium: 4.1 mmol/L (ref 3.5–5.1)
Sodium: 138 mmol/L (ref 135–145)

## 2019-12-02 LAB — I-STAT BETA HCG BLOOD, ED (MC, WL, AP ONLY): I-stat hCG, quantitative: <5 m[IU]/mL (ref <5)

## 2019-12-02 MED ORDER — SODIUM CHLORIDE 0.9 % IV BOLUS
1000.0000 mL | Freq: Once | INTRAVENOUS | Status: AC
  Administered 2019-12-02: 1000 mL via INTRAVENOUS

## 2019-12-02 MED ORDER — CYCLOBENZAPRINE HCL 10 MG PO TABS: 10.0000 mg | ORAL_TABLET | Freq: Two times a day (BID) | ORAL | 0 refills | Status: AC | PRN

## 2019-12-02 MED ORDER — EXCEDRIN MIGRAINE 250-250-65 MG PO TABS: 1.0000 | ORAL_TABLET | Freq: Four times a day (QID) | ORAL | 0 refills | Status: AC | PRN

## 2019-12-02 MED ORDER — SODIUM CHLORIDE (PF) 0.9 % IJ SOLN
INTRAMUSCULAR | Status: AC
  Filled 2019-12-02: qty 50

## 2019-12-02 MED ORDER — IOHEXOL 300 MG/ML  SOLN
75.0000 mL | Freq: Once | INTRAMUSCULAR | Status: AC | PRN
  Administered 2019-12-02: 75 mL via INTRAVENOUS

## 2019-12-02 MED ORDER — METOCLOPRAMIDE HCL 5 MG/ML IJ SOLN
10.0000 mg | Freq: Once | INTRAMUSCULAR | Status: AC
  Administered 2019-12-02: 10 mg via INTRAVENOUS
  Filled 2019-12-02: qty 2

## 2019-12-02 NOTE — ED Notes (Addendum)
Patient states she is feeling anxious. States it feels like she is about to pass out. Pt removed all monitoring devices from her person. PA made aware. Will continue to monitor.

## 2019-12-02 NOTE — ED Notes (Signed)
IV team at bedside 

## 2019-12-02 NOTE — ED Notes (Signed)
Lab called to recollect BMP. CBC is being processed.

## 2019-12-02 NOTE — Discharge Instructions (Signed)
Take the prescribed medication as directed.   Follow-up with your primary care doctor.  If headache still not improving, you may ultimately need referral to neurology even with negative head CT from today.  I have attached their office information if needed. Return to the ED for new or worsening symptoms.

## 2019-12-02 NOTE — ED Provider Notes (Signed)
[facility] DEPT Provider Note   CSN: [id number] Arrival date & time: 12/01/19  2207     History Chief Complaint  Patient presents with  . Migraine    Tonya Davidson is a 29 y.o. female.  The history is provided by the patient and medical records.   29 y.o. F with hx of anemia, arthritis, bipolar disorder, chronic pain, fibromyalgia, presenting to the ED with headache.  Patient reports she has been having a headache for the past 8 weeks or so.  Initially mild, now more severe but unchanged location along left jaw and radiating into top of her head.  States she had her daughter on 10/08/19 that was without complication.  She did have epidural, saw OB-GYN for follow-up visit and not felt to be related to that-- was give fioricet and magnesium bug has since run out.  Was also tested for COVID which was negative.  She did see her dentist who did x-rays and told her she had a dental infection in one of her left upper molars.  She tried course of amoxicillin then course of augmentin but has not had any significant relief or change in her pain.  States for the past 24 hours she has been vomiting because her head hurts so bad.  She denies any dizziness, confusion, numbness, weakness, blurred vision, trouble walking, changes in speech, etc.  No falls or head trauma.  Reports fever yesterday around 102F (afebrile here).  She is not on anticoagulation.  She has continued OTC meds without relief.  She is pumping but not breastfeeding, baby is exclusively formula fed.  Patient reports she did call her dentist office today and was sent here with concern of disseminated infection into her brain.  Past Medical History:  Diagnosis Date  . Abnormal Pap smear    colposcopy  . Anemia   . Anxiety   . Arthritis   . Bipolar 1 disorder ([place])   . C. difficile diarrhea   . Chlamydia 04/29/2012  . Chronic pain   . Depression   . Drug-seeking behavior   . Fibromyalgia   . HPV in  female   . IBS (irritable bowel syndrome)   . Interstitial cystitis   . Kidney stones   . Mononucleosis   . Ovarian cyst 01/2019   right   . Pelvic pain in female 12/14/2014  . PID (acute pelvic inflammatory disease) 04/28/2012  . Ulcer     Patient Active Problem List   Diagnosis Date Noted  . Pelvic pain affecting pregnancy 02/13/2019  . Diarrhea 02/13/2019  . Elevated bilirubin 02/12/2019  . Polysubstance abuse ([place]) 10/13/2018  . 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 Pyelonephritis 09/15/2016  . Right flank pain   . Pelvic pain in female 12/14/2014  . Severe episode of recurrent major depressive disorder ([place]) 10/03/2013  . GAD (generalized anxiety disorder) 09/30/2013  . PID (acute pelvic inflammatory disease) 04/28/2012  . ANXIETY 05/18/2009  . IBS 05/18/2009    Past Surgical History:  Procedure Laterality Date  . COLPOSCOPY    . INNER EAR SURGERY    . KIDNEY STONE SURGERY    . LAPAROSCOPIC CHOLECYSTECTOMY    . TONSILLECTOMY    . WISDOM TOOTH EXTRACTION       OB History    Gravida  1   Para      Term      Preterm      AB  Living        SAB      TAB      Ectopic      Multiple      Live Births              Family History  Problem Relation Age of Onset  . Bipolar disorder Mother   . Cancer Father   . ADD / ADHD Brother   . Cancer Maternal Grandmother   . Cancer Maternal Grandfather   . Cancer Paternal Grandmother   . Cancer Paternal Grandfather     Social History   Tobacco Use  . Smoking status: Current Every Day Smoker    Packs/day: 0.50    Years: 5.00    Pack years: 2.50    Types: Cigarettes  . Smokeless tobacco: Never Used  Substance Use Topics  . Alcohol use: No  . Drug use: Not Currently    Types: Marijuana    Comment: no tused for 1 year    Home Medications Prior to Admission medications   Medication Sig Start Date End Date Taking? Authorizing Provider    acetaminophen (TYLENOL) 500 MG tablet Take 1,000 mg by mouth every 6 (six) hours as needed for mild pain.   Yes [provider]  ibuprofen (ADVIL) 200 MG tablet Take 800 mg by mouth every 6 (six) hours as needed for moderate pain.   Yes [provider]  naproxen sodium (ALEVE) 220 MG tablet Take 220 mg by mouth daily as needed (pain).   Yes [provider]  Prenatal Vit-Fe Fumarate-FA (PRENATAL MULTIVITAMIN) TABS tablet Take 1 tablet by mouth daily at 12 noon.   Yes [provider]  acetaminophen (TYLENOL) 325 MG tablet Take 2 tablets (650 mg total) by mouth every 6 (six) hours as needed for mild pain or moderate pain. (May buy over the counter) Patient not taking: Reported on 03/28/2019 10/15/18   Aldean Baker, NP  cephALEXin (KEFLEX) 500 MG capsule 1 cap po bid x 14 days Patient not taking: Reported on 07/25/2019 04/01/19   Palumbo, April, MD  clonazePAM (KLONOPIN) 0.5 MG tablet Take 1 tablet (0.5 mg total) by mouth 2 (two) times daily as needed (anxiety or insomnia). Patient not taking: Reported on 07/25/2019 10/15/18   Aldean Baker, NP  cyclobenzaprine (FLEXERIL) 10 MG tablet Take 1 tablet (10 mg total) by mouth 3 (three) times daily as needed for muscle spasms. Patient not taking: Reported on 12/02/2019 09/21/19   Sparacino, Hailey L, DO  dicyclomine (BENTYL) 20 MG tablet Take 1 tablet (20 mg total) by mouth 2 (two) times daily. Patient not taking: Reported on 03/28/2019 11/18/18   Garlon Hatchet, PA-C  Doxylamine-Pyridoxine 10-10 MG TBEC Take 2 tablets by mouth at bedtime as needed (for nausea). Patient not taking: Reported on 07/25/2019 02/20/19   Antony Madura, PA-C  Elastic Bandages & Supports (COMFORT FIT MATERNITY SUPP SM) MISC 1 Units by Does not apply route daily as needed. 07/08/19   Nugent, Odie Sera, NP  hydrOXYzine (ATARAX/VISTARIL) 25 MG tablet Take 1 tablet (25 mg total) by mouth every 6 (six) hours as needed (anxiety/agitation or CIWA < or =  10). Patient not taking: Reported on 03/28/2019 10/15/18   Aldean Baker, NP  lidocaine (LIDODERM) 5 % Place 1 patch onto the skin daily. Remove & Discard patch within 12 hours or as directed by MD Patient not taking: Reported on 12/02/2019 07/25/19   Palumbo, April, MD  nitrofurantoin, macrocrystal-monohydrate, (MACROBID) 100 MG  capsule Take 1 capsule (100 mg total) by mouth 2 (two) times daily. X 7 days Patient not taking: Reported on 12/02/2019 07/25/19   Palumbo, April, MD  ondansetron (ZOFRAN ODT) 4 MG disintegrating tablet Take 1 tablet (4 mg total) by mouth every 8 (eight) hours as needed for nausea. Patient not taking: Reported on 03/28/2019 11/18/18   Garlon Hatchet, PA-C  simethicone (GAS-X) 80 MG chewable tablet Chew 1 tablet (80 mg total) by mouth every 6 (six) hours as needed for flatulence. Patient not taking: Reported on 03/28/2019 02/13/19   Aviva Signs, CNM    Allergies    Azithromycin, Doxycycline, Morphine, Prednisone, Bactrim [sulfamethoxazole-trimethoprim], Flagyl [metronidazole], Toradol [ketorolac tromethamine], and Morphine and related  Review of Systems   Review of Systems  Neurological: Positive for headaches.  All other systems reviewed and are negative.   Physical Exam Updated Vital Signs BP 118/79   Pulse 80   Temp (!) 97.1 F (36.2 C) (Oral)   Resp 18   Ht 5\' 4"  (1.626 m)   Wt 54 kg   LMP 12/01/2018   SpO2 97%   BMI 20.43 kg/m   Physical Exam Vitals and nursing note reviewed.  Constitutional:      General: She is not in acute distress.    Appearance: She is well-developed. She is not diaphoretic.  HENT:     Head: Normocephalic and atraumatic.     Right Ear: External ear normal.     Left Ear: External ear normal.     Mouth/Throat:     Comments: Teeth largely in good dentition, left upper molar without signs of discrete abscess or significant decay, surrounding gingiva normal in appearance, handling secretions appropriately, no trismus, no facial  or neck swelling, normal phonation without stridor Eyes:     Conjunctiva/sclera: Conjunctivae normal.     Pupils: Pupils are equal, round, and reactive to light.  Neck:     Comments: No rigidity, no meningismus Cardiovascular:     Rate and Rhythm: Normal rate and regular rhythm.     Heart sounds: Normal heart sounds. No murmur.  Pulmonary:     Effort: Pulmonary effort is normal. No respiratory distress.     Breath sounds: Normal breath sounds. No wheezing or rhonchi.  Abdominal:     General: Bowel sounds are normal.     Palpations: Abdomen is soft.     Tenderness: There is no abdominal tenderness. There is no guarding.  Musculoskeletal:        General: Normal range of motion.     Cervical back: Full passive range of motion without pain, normal range of motion and neck supple. No rigidity.  Skin:    General: Skin is warm and dry.     Findings: No rash.  Neurological:     Mental Status: She is alert and oriented to person, place, and time.     Cranial Nerves: No cranial nerve deficit.     Sensory: No sensory deficit.     Motor: No tremor or seizure activity.     Comments: AAOx3, answering questions and following commands appropriately; equal strength UE and LE bilaterally; CN grossly intact; moves all extremities appropriately without ataxia; no focal neuro deficits or facial asymmetry appreciated  Psychiatric:        Behavior: Behavior normal.        Thought Content: Thought content normal.     ED Results / Procedures / Treatments   Labs (all labs ordered are listed, but only abnormal  results are displayed) Labs Reviewed  CBC WITH DIFFERENTIAL/PLATELET - Abnormal; Notable for the following components:      Result Value   RDW 11.4 (*)    All other components within normal limits  BASIC METABOLIC PANEL  I-STAT BETA HCG BLOOD, ED ([facility], [facility], [facility] ONLY)    EKG None  Radiology CT HEAD W & WO CONTRAST  Result Date: 12/02/2019 CLINICAL DATA:  Headache and dental infection.  EXAM: CT HEAD WITHOUT AND WITH CONTRAST TECHNIQUE: Contiguous axial images were obtained from the base of the skull through the vertex without and with intravenous contrast CONTRAST:  75mL OMNIPAQUE IOHEXOL 300 MG/ML  SOLN COMPARISON:  None. FINDINGS: Brain: There is no mass, hemorrhage or extra-axial collection. The size and configuration of the ventricles and extra-axial CSF spaces are normal. The brain parenchyma is normal, without acute or chronic infarction. No abnormal enhancement. Vascular: No abnormal hyperdensity of the major intracranial arteries or dural venous sinuses. No intracranial atherosclerosis. Skull: The visualized skull base, calvarium and extracranial soft tissues are normal. Sinuses/Orbits: No fluid levels or advanced mucosal thickening of the visualized paranasal sinuses. No mastoid or middle ear effusion. The orbits are normal. IMPRESSION: Normal head CT. Electronically Signed   By: Deatra RobinsonKevin  Herman M.D.   On: 12/02/2019 03:18    Procedures Procedures (including critical care time)  Medications Ordered in ED Medications  sodium chloride 0.9 % bolus 1,000 mL (0 mLs Intravenous Stopped 12/02/19 0319)  metoCLOPramide (REGLAN) injection 10 mg (10 mg Intravenous Given 12/02/19 0156)  iohexol (OMNIPAQUE) 300 MG/ML solution 75 mL (75 mLs Intravenous Contrast Given 12/02/19 0257)  sodium chloride (PF) 0.9 % injection (  Given 12/02/19 0319)    ED Course  I have reviewed the triage vital signs and the nursing notes.  Pertinent labs & imaging results that were available during my care of the patient were reviewed by me and considered in my medical decision making (see chart for details).    MDM Rules/Calculators/A&P  29 year old female presenting to the ED with headache for the past 8-week.  Has since had a baby with epidural, has had appropriate follow-up with OB/GYN and did not feel was related to this.  She transiently had some relief with Fioricet.  Has also seen dentist and  diagnosed with left upper dental infection, but continues to have pain despite 2 rounds of antibiotics.  She is afebrile and nontoxic in appearance here.  She does not have any focal neurologic deficits.  She does not have any signs or symptoms on exam that are clinically concerning for meningitis.  Also without evidence of acute dental abscess on exam.  No facial or neck swelling, no concerning symptoms for Ludwig's angina.  Reports she spoke with her dentist today who sent her here to rule out infection spreading into her brain.  Given her reassuring neurologic exam, I have low suspicion for this but given persistent nature of headache for several weeks will obtain imaging and labs.    3:26 AM Labs reassuring.  CT head w/ and w/out contrast normal.  Patient reassessed, states she feels anxious after the reglan but actually appears quite calm to me-- laying on stretcher with eyes closed, no tachycardia, elevated BP, or other concerning symptoms.  No evidence of dystonia, akathesia, tardive dyskinesia, or other EPS.  We have discussed results.  We have discussed other possible causes of her headache including possibly TMJ.  She does report some pain and occasional popping when opening/closing her mouth.  Can try  muscle relaxer.  Her OB/GYN did recommend possible referral to neurology which I discussed with patient again, she states "if my CT was normal, I am not going to go".  She is requesting to leave at this time which seems appropriate.  Will have her follow-up with PCP, has upcoming appt with dentist as well for tooth extraction.  She may return here for any new/acute changes.  Final Clinical Impression(s) / ED Diagnoses Final diagnoses:  Bad headache    Rx / DC Orders ED Discharge Orders         Ordered    cyclobenzaprine (FLEXERIL) 10 MG tablet  2 times daily PRN     12/02/19 0331    aspirin-acetaminophen-caffeine (EXCEDRIN MIGRAINE) 250-250-65 MG tablet  Every 6 hours PRN     12/02/19 0331            Garlon Hatchet, PA-C 12/02/19 0414    Nira Conn, MD 12/02/19 [phone]

## 2019-12-02 NOTE — ED Notes (Addendum)
Patient stated she is not waiting for her discharge paperwork and denied getting vital signs at time of exit. States "Ill just look at [facility] or something, Im not waiting." Patient ambulatory, gait steady, no signs of obvious distress.

## 2020-04-17 ENCOUNTER — Emergency Department (HOSPITAL_COMMUNITY)
Admission: EM | Admit: 2020-04-17 | Discharge: 2020-04-18 | Disposition: A | Discharge status: Home / Self Care | Payer: Medicaid Other | Attending: Emergency Medicine | Admitting: Emergency Medicine

## 2020-04-17 ENCOUNTER — Encounter (HOSPITAL_COMMUNITY): Payer: Self-pay

## 2020-04-17 DIAGNOSIS — F17210 Nicotine dependence, cigarettes, uncomplicated: Secondary | ICD-10-CM | POA: Diagnosis not present

## 2020-04-17 DIAGNOSIS — I880 Nonspecific mesenteric lymphadenitis: Secondary | ICD-10-CM | POA: Diagnosis not present

## 2020-04-17 DIAGNOSIS — R1031 Right lower quadrant pain: Secondary | ICD-10-CM | POA: Diagnosis present

## 2020-04-17 LAB — CBC
HCT: 38.5 % (ref 36.0–46.0)
Hemoglobin: 12.4 g/dL (ref 12.0–15.0)
MCH: 31.1 pg (ref 26.0–34.0)
MCHC: 32.2 g/dL (ref 30.0–36.0)
MCV: 96.5 fL (ref 80.0–100.0)
Platelets: 295 10*3/uL (ref 150–400)
RBC: 3.99 MIL/uL (ref 3.87–5.11)
RDW: 12 % (ref 11.5–15.5)
WBC: 7.8 10*3/uL (ref 4.0–10.5)
nRBC: 0 % (ref 0.0–0.2)

## 2020-04-17 LAB — URINALYSIS, ROUTINE W REFLEX MICROSCOPIC
Bilirubin Urine: NEGATIVE
Glucose, UA: NEGATIVE mg/dL
Hgb urine dipstick: NEGATIVE
Ketones, ur: NEGATIVE mg/dL
Leukocytes,Ua: NEGATIVE
Nitrite: NEGATIVE
Protein, ur: NEGATIVE mg/dL
Specific Gravity, Urine: 1.021 (ref 1.005–1.030)
pH: 6 (ref 5.0–8.0)

## 2020-04-17 LAB — BASIC METABOLIC PANEL
Anion gap: 9 (ref 5–15)
BUN: 21 mg/dL — ABNORMAL HIGH (ref 6–20)
CO2: 25 mmol/L (ref 22–32)
Calcium: 8.4 mg/dL — ABNORMAL LOW (ref 8.9–10.3)
Chloride: 105 mmol/L (ref 98–111)
Creatinine, Ser: 0.89 mg/dL (ref 0.44–1.00)
GFR calc Af Amer: >60 mL/min (ref >60)
GFR calc non Af Amer: >60 mL/min (ref >60)
Glucose, Bld: 97 mg/dL (ref 70–99)
Potassium: 4.1 mmol/L (ref 3.5–5.1)
Sodium: 139 mmol/L (ref 135–145)

## 2020-04-17 NOTE — ED Notes (Signed)
Pt step outside 

## 2020-04-17 NOTE — ED Triage Notes (Signed)
Pt arrives to ED w/ c/o 9/10 R/LLQ abdominal pain that started 5 days ago. Pt had US done last night which confirmed ovarian cyst. Pt endorses n/d, denies v. Pt denies vaginal bleeding.

## 2020-04-18 ENCOUNTER — Emergency Department (HOSPITAL_COMMUNITY): Payer: Medicaid Other

## 2020-04-18 MED ORDER — IOHEXOL 300 MG/ML  SOLN
100.0000 mL | Freq: Once | INTRAMUSCULAR | Status: AC | PRN
  Administered 2020-04-18: 100 mL via INTRAVENOUS

## 2020-04-18 MED ORDER — SODIUM CHLORIDE 0.9 % IV BOLUS
1000.0000 mL | Freq: Once | INTRAVENOUS | Status: AC
  Administered 2020-04-18: 1000 mL via INTRAVENOUS

## 2020-04-18 MED ORDER — OXYCODONE-ACETAMINOPHEN 5-325 MG PO TABS: 1.0000 | ORAL_TABLET | Freq: Four times a day (QID) | ORAL | 0 refills | Status: AC | PRN

## 2020-04-18 MED ORDER — ONDANSETRON 4 MG PO TBDP: 4.0000 mg | ORAL_TABLET | Freq: Three times a day (TID) | ORAL | 0 refills | Status: AC | PRN

## 2020-04-18 MED ORDER — HYDROMORPHONE HCL 1 MG/ML IJ SOLN
1.0000 mg | Freq: Once | INTRAMUSCULAR | Status: AC
  Administered 2020-04-18: 1 mg via INTRAVENOUS
  Filled 2020-04-18: qty 1

## 2020-04-18 MED ORDER — ONDANSETRON HCL 4 MG/2ML IJ SOLN
4.0000 mg | Freq: Once | INTRAMUSCULAR | Status: AC
  Administered 2020-04-18: 4 mg via INTRAVENOUS
  Filled 2020-04-18: qty 2

## 2020-04-18 MED ORDER — MORPHINE SULFATE (PF) 4 MG/ML IV SOLN
4.0000 mg | Freq: Once | INTRAVENOUS | Status: AC
  Administered 2020-04-18: 4 mg via INTRAVENOUS
  Filled 2020-04-18: qty 1

## 2020-04-18 NOTE — ED Notes (Signed)
Patient's boyfriend at bedside is providing patient with a ride home.

## 2020-04-18 NOTE — Discharge Instructions (Addendum)
  Antiinflammatory medications: Take 600 mg of ibuprofen every 6 hours or 440 mg (over the counter dose) to 500 mg (prescription dose) of naproxen every 12 hours for the next 3 days. After this time, these medications may be used as needed for pain. Take these medications with food to avoid upset stomach. Choose only one of these medications, do not take them together. Acetaminophen (generic for Tylenol): Should you continue to have additional pain while taking the ibuprofen or naproxen, you may add in acetaminophen as needed. Your daily total maximum amount of acetaminophen from all sources should be limited to 4000mg /day for persons without liver problems, or 2000mg /day for those with liver problems. Percocet: May take Percocet (oxycodone-acetaminophen) as needed for severe pain.   Do not drive or perform other dangerous activities while taking this medication as it can cause drowsiness as well as changes in reaction time and judgement.  Please note that each pill of Percocet contains 325 mg of acetaminophen (generic for Tylenol) and the above dosage limits apply. Avoid breast-feeding while taking narcotics.  They can cause sedation in infants. Nausea/vomiting: Use the ondansetron (generic for Zofran) for nausea or vomiting.  This medication may not prevent all vomiting or nausea, but can help facilitate better hydration. Things that can help with nausea/vomiting also include peppermint/menthol candies, vitamin B12, and ginger.  Follow-up: Recommend follow-up with gastroenterology on this matter.  Follow-up with primary care provider is also recommended.  Return: Return to the emergency department for worsening pain, spreading pain, fever along with the pain, passing out, uncontrolled vomiting, or any other major concerns.

## 2020-04-18 NOTE — ED Provider Notes (Signed)
[facility] EMERGENCY DEPARTMENT Provider Note   CSN: [id number] Arrival date & time: 04/17/20  1817     History Chief Complaint  Patient presents with  . Abdominal Pain    Tonya Davidson is a 29 y.o. female.  HPI      Tonya Davidson is a 29 y.o. female, with a history of bipolar, opioid abuse, ovarian cyst, PID, presenting to the ED with abdominal pain for the last 6 days.  Initially, pain to both lower quadrants of abdomen, now localized in RLQ, cramping and burning.  Accompanied by nausea and diarrhea. Spontaneous vaginal delivery January 2021.  Intermittently breast-feeding.  States she has been clean from narcotics for the past year. She was evaluated in the ED in [facility], told she had a ovarian cyst, prescribed Toradol, and told to follow-up with OB/GYN.  Denies fever, vomiting, hematochezia/melena, chest pain, shortness of breath, cough, urinary symptoms, abnormal vaginal discharge, vaginal bleeding, or any other complaints.  Past Medical History:  Diagnosis Date  . Abnormal Pap smear    colposcopy  . Anemia   . Anxiety   . Arthritis   . Bipolar 1 disorder (HCC)   . C. difficile diarrhea   . Chlamydia 04/29/2012  . Chronic pain   . Depression   . Drug-seeking behavior   . Fibromyalgia   . HPV in female   . IBS (irritable bowel syndrome)   . Interstitial cystitis   . Kidney stones   . Mononucleosis   . Ovarian cyst 01/2019   right   . Pelvic pain in female 12/14/2014  . PID (acute pelvic inflammatory disease) 04/28/2012  . Ulcer     Patient Active Problem List   Diagnosis Date Noted  . Pelvic pain affecting pregnancy 02/13/2019  . Diarrhea 02/13/2019  . Elevated bilirubin 02/12/2019  . Polysubstance abuse (HCC) 10/13/2018  . 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 Pyelonephritis 09/15/2016  . Right flank pain   . Pelvic pain in female 12/14/2014  . Severe episode of recurrent  major depressive disorder (HCC) 10/03/2013  . GAD (generalized anxiety disorder) 09/30/2013  . PID (acute pelvic inflammatory disease) 04/28/2012  . ANXIETY 05/18/2009  . IBS 05/18/2009    Past Surgical History:  Procedure Laterality Date  . COLPOSCOPY    . INNER EAR SURGERY    . KIDNEY STONE SURGERY    . LAPAROSCOPIC CHOLECYSTECTOMY    . TONSILLECTOMY    . WISDOM TOOTH EXTRACTION       OB History    Gravida  1   Para      Term      Preterm      AB      Living        SAB      TAB      Ectopic      Multiple      Live Births              Family History  Problem Relation Age of Onset  . Bipolar disorder Mother   . Cancer Father   . ADD / ADHD Brother   . Cancer Maternal Grandmother   . Cancer Maternal Grandfather   . Cancer Paternal Grandmother   . Cancer Paternal Grandfather     Social History   Tobacco Use  . Smoking status: Current Every Day Smoker    Packs/day: 0.50    Years: 5.00    Pack years: 2.50  Types: Cigarettes  . Smokeless tobacco: Never Used  Substance Use Topics  . Alcohol use: No  . Drug use: Not Currently    Types: Marijuana    Comment: no tused for 1 year    Home Medications Prior to Admission medications   Medication Sig Start Date End Date Taking? Authorizing Provider  acetaminophen (TYLENOL) 325 MG tablet Take 2 tablets (650 mg total) by mouth every 6 (six) hours as needed for mild pain or moderate pain. (May buy over the counter) Patient not taking: Reported on 03/28/2019 10/15/18   Aldean Baker, NP  acetaminophen (TYLENOL) 500 MG tablet Take 1,000 mg by mouth every 6 (six) hours as needed for mild pain.    [provider]  aspirin-acetaminophen-caffeine (EXCEDRIN MIGRAINE) [phone] MG tablet Take 1 tablet by mouth every 6 (six) hours as needed for headache. 12/02/19   Garlon Hatchet, PA-C  cephALEXin (KEFLEX) 500 MG capsule 1 cap po bid x 14 days Patient not taking: Reported on 07/25/2019 04/01/19    Palumbo, April, MD  clonazePAM (KLONOPIN) 0.5 MG tablet Take 1 tablet (0.5 mg total) by mouth 2 (two) times daily as needed (anxiety or insomnia). Patient not taking: Reported on 07/25/2019 10/15/18   Aldean Baker, NP  cyclobenzaprine (FLEXERIL) 10 MG tablet Take 1 tablet (10 mg total) by mouth 2 (two) times daily as needed (jaw pain). 12/02/19   Garlon Hatchet, PA-C  dicyclomine (BENTYL) 20 MG tablet Take 1 tablet (20 mg total) by mouth 2 (two) times daily. Patient not taking: Reported on 03/28/2019 11/18/18   Garlon Hatchet, PA-C  Doxylamine-Pyridoxine 10-10 MG TBEC Take 2 tablets by mouth at bedtime as needed (for nausea). Patient not taking: Reported on 07/25/2019 02/20/19   Antony Madura, PA-C  Elastic Bandages & Supports (COMFORT FIT MATERNITY SUPP SM) MISC 1 Units by Does not apply route daily as needed. 07/08/19   Nugent, Odie Sera, NP  hydrOXYzine (ATARAX/VISTARIL) 25 MG tablet Take 1 tablet (25 mg total) by mouth every 6 (six) hours as needed (anxiety/agitation or CIWA < or = 10). Patient not taking: Reported on 03/28/2019 10/15/18   Aldean Baker, NP  ibuprofen (ADVIL) 200 MG tablet Take 800 mg by mouth every 6 (six) hours as needed for moderate pain.    [provider]  lidocaine (LIDODERM) 5 % Place 1 patch onto the skin daily. Remove & Discard patch within 12 hours or as directed by MD Patient not taking: Reported on 12/02/2019 07/25/19   Palumbo, April, MD  naproxen sodium (ALEVE) 220 MG tablet Take 220 mg by mouth daily as needed (pain).    [provider]  nitrofurantoin, macrocrystal-monohydrate, (MACROBID) 100 MG capsule Take 1 capsule (100 mg total) by mouth 2 (two) times daily. X 7 days Patient not taking: Reported on 12/02/2019 07/25/19   Palumbo, April, MD  ondansetron (ZOFRAN ODT) 4 MG disintegrating tablet Take 1 tablet (4 mg total) by mouth every 8 (eight) hours as needed for nausea or vomiting. 04/18/20   Angelgabriel Willmore C, PA-C  oxyCODONE-acetaminophen (PERCOCET/ROXICET)  5-325 MG tablet Take 1 tablet by mouth every 6 (six) hours as needed for severe pain. 04/18/20   Bonny Vanleeuwen, Hillard Danker, PA-C  Prenatal Vit-Fe Fumarate-FA (PRENATAL MULTIVITAMIN) TABS tablet Take 1 tablet by mouth daily at 12 noon.    [provider]  simethicone (GAS-X) 80 MG chewable tablet Chew 1 tablet (80 mg total) by mouth every 6 (six) hours as needed for flatulence. Patient not taking:  Reported on 03/28/2019 02/13/19   Aviva SignsWilliams, Marie L, CNM    Allergies    Azithromycin, Doxycycline, Prednisone, Bactrim [sulfamethoxazole-trimethoprim], Flagyl [metronidazole], and Toradol [ketorolac tromethamine]  Review of Systems   Review of Systems  Constitutional: Negative for chills and fever.  Respiratory: Negative for cough and shortness of breath.   Cardiovascular: Negative for chest pain.  Gastrointestinal: Positive for abdominal pain, diarrhea and nausea. Negative for blood in stool and vomiting.  Genitourinary: Negative for difficulty urinating, dysuria, flank pain, vaginal bleeding and vaginal discharge.  Musculoskeletal: Negative for back pain.  Neurological: Negative for syncope.  All other systems reviewed and are negative.   Physical Exam Updated Vital Signs BP 108/71 (BP Location: Left Arm)   Pulse 80   Temp 98.3 F (36.8 C)   Resp 17   Ht 5\' 1"  (1.549 m)   Wt 54 kg   SpO2 100%   BMI 22.48 kg/m   Physical Exam Vitals and nursing note reviewed.  Constitutional:      General: She is not in acute distress.    Appearance: She is well-developed. She is not diaphoretic.  HENT:     Head: Normocephalic and atraumatic.     Mouth/Throat:     Mouth: Mucous membranes are moist.     Pharynx: Oropharynx is clear.  Eyes:     Conjunctiva/sclera: Conjunctivae normal.  Cardiovascular:     Rate and Rhythm: Normal rate and regular rhythm.     Pulses: Normal pulses.          Radial pulses are 2+ on the right side and 2+ on the left side.       Posterior tibial pulses are 2+ on the  right side and 2+ on the left side.     Heart sounds: Normal heart sounds.     Comments: Tactile temperature in the extremities appropriate and equal bilaterally. Pulmonary:     Effort: Pulmonary effort is normal. No respiratory distress.     Breath sounds: Normal breath sounds.  Abdominal:     Palpations: Abdomen is soft.     Tenderness: There is abdominal tenderness in the right lower quadrant. There is guarding. Positive signs include Rovsing's sign and McBurney's sign.  Musculoskeletal:     Cervical back: Neck supple.     Right lower leg: No edema.     Left lower leg: No edema.  Lymphadenopathy:     Cervical: No cervical adenopathy.  Skin:    General: Skin is warm and dry.  Neurological:     Mental Status: She is alert.  Psychiatric:        Mood and Affect: Mood and affect normal.        Speech: Speech normal.        Behavior: Behavior normal.     ED Results / Procedures / Treatments   Labs (all labs ordered are listed, but only abnormal results are displayed) Labs Reviewed  BASIC METABOLIC PANEL - Abnormal; Notable for the following components:      Result Value   BUN 21 (*)    Calcium 8.4 (*)    All other components within normal limits  CBC  URINALYSIS, ROUTINE W REFLEX MICROSCOPIC    EKG None  Radiology CT ABDOMEN PELVIS W CONTRAST  Result Date: 04/18/2020 CLINICAL DATA:  RIGHT lower quadrant pain. EXAM: CT ABDOMEN AND PELVIS WITH CONTRAST TECHNIQUE: Multidetector CT imaging of the abdomen and pelvis was performed using the standard protocol following bolus administration of intravenous contrast. CONTRAST:  OMNIPAQUE IOHEXOL 300 MG/ML  SOLN COMPARISON:  CT abdomen dated 04/16/2020. FINDINGS: Lower chest: No acute abnormality. Hepatobiliary: No focal liver abnormality is seen. Status post cholecystectomy. No biliary dilatation. Pancreas: Unremarkable. No pancreatic ductal dilatation or surrounding inflammatory changes. Spleen: Normal in size without focal  abnormality. Adrenals/Urinary Tract: Adrenal glands appear normal. Kidneys appear normal without mass, stone or hydronephrosis. No perinephric fluid. No ureteral or bladder calculi identified. Bladder is decompressed. Stomach/Bowel: No dilated large or small bowel loops. No evidence of bowel wall inflammation. Moderate amount of stool and gas throughout the nondistended colon. The appendix is not convincingly seen (perhaps seen with a normal appearance on sagittal series 7, image 47) but there are no inflammatory changes about the cecum to suggest acute appendicitis. No convincing evidence of bowel wall inflammation. However, fluid is present within the distal small bowel which can be an indication of underlying enteritis. Stomach is unremarkable, partially decompressed. Vascular/Lymphatic: Mildly prominent lymph nodes in the RIGHT lower quadrant mesentery. No enlarged lymph nodes seen. No vascular abnormality. Reproductive: The uterus and adnexal regions are unremarkable. No adnexal mass or free fluid. Other: No free fluid or abscess collection. No free intraperitoneal air. Musculoskeletal: No acute or significant osseous findings. IMPRESSION: 1. Distal small bowel is now mildly distended with fluid and air which can be a secondary indication of underlying enteritis of infectious or inflammatory nature. 2. Mildly prominent lymph nodes in the RIGHT lower quadrant mesentery, suspicious for an associated mild mesenteric adenitis. 3. No evidence of appendicitis. 4. Remainder of the abdomen and pelvis is unremarkable. No ovarian/adnexal mass or free fluid. No bowel obstruction. No renal or ureteral calculi. Electronically Signed   By: Bary Richard M.D.   On: 04/18/2020 13:28      US Pelvic Complete transabdominal, transvaginal with Doppler performed early morning 7/31: EXAM:  TRANSABDOMINAL AND TRANSVAGINAL ULTRASOUND OF PELVIS   TECHNIQUE:  Both transabdominal and transvaginal ultrasound examinations of the    pelvis were performed. Transabdominal technique was performed for  global imaging of the pelvis including uterus, ovaries, adnexal  regions, and pelvic cul-de-sac. It was necessary to proceed with  endovaginal exam following the transabdominal exam to visualize the  ovaries.   COMPARISON: CT same day   FINDINGS:  Uterus   Measurements: 8.1 x 3.7 x 5.3 cm = volume: 83 mL. No fibroids or  other mass visualized.   Endometrium   Thickness: 15.3 mm. No focal abnormality visualized.   Right ovary   Measurements: 2.5 x 2.4 x 3.3 cm = volume: 10.3 mL. There is an  anechoic simplecyst/dominant follicle within the right ovary  measuring 2.1 x 1.5 cm. Normal Doppler flow seen.   Left ovary   Measurements: 3.0 x 2.0 x 1.9 cm = volume: 5.9 mL. Normal  appearance/no adnexal mass.   Other findings   No abnormal free fluid.   IMPRESSION:  Normal appearing uterus.   Dominant follicle/simple ovarian cyst within the right ovary.    Electronically Signed  By: Jonna Clark M.D.  On: 04/17/2020 01:37    EXAM:  CT ABDOMEN AND PELVIS WITHOUT CONTRAST   TECHNIQUE:  Multidetector CT imaging of the abdomen and pelvis was performed  following the standard protocol without IV contrast.   COMPARISON: 11/18/2018   FINDINGS:  Lower chest: No acute abnormality.   Hepatobiliary: No focal liver abnormality is seen. Status post  cholecystectomy. No biliary dilatation.   Pancreas: Unremarkable. No pancreatic ductal dilatation or  surrounding inflammatory changes.   Spleen:  Normal in size without focal abnormality.   Adrenals/Urinary Tract: Adrenal glands are within normal limits. The  kidneys are well visualized bilaterally. No renal calculi or urinary  tract obstructive changes are noted. The bladder is partially  distended.   Stomach/Bowel: Appendix is not well appreciated on today's exam. No  inflammatory changes to suggest appendicitis are noted. The colon  and small  bowel appear within normal limits. No gastric abnormality  is seen.   Vascular/Lymphatic: No significant vascular findings are present. No  enlarged abdominal or pelvic lymph nodes.   Reproductive: Uterus is unremarkable. Bilateral ovarian cystic  change is noted.   Other: No abdominal wall hernia or abnormality. No abdominopelvic  ascites.   Musculoskeletal: No acute or significant osseous findings.   IMPRESSION:  No acute abnormality noted.    Electronically Signed  By: Alcide Clever M.D.  On: 04/16/2020 22:34    Procedures Procedures (including critical care time)  Medications Ordered in ED Medications  sodium chloride 0.9 % bolus 1,000 mL (0 mLs Intravenous Stopped 04/18/20 1444)  morphine 4 MG/ML injection 4 mg (4 mg Intravenous Given 04/18/20 1223)  ondansetron (ZOFRAN) injection 4 mg (4 mg Intravenous Given 04/18/20 1222)  iohexol (OMNIPAQUE) 300 MG/ML solution 100 mL (100 mLs Intravenous Contrast Given 04/18/20 1242)  HYDROmorphone (DILAUDID) injection 1 mg (1 mg Intravenous Given 04/18/20 1455)    ED Course  I have reviewed the triage vital signs and the nursing notes.  Pertinent labs & imaging results that were available during my care of the patient were reviewed by me and considered in my medical decision making (see chart for details).  Clinical Course as of Apr 18 1716  Wynelle Link Apr 18, 2020  1144 Consistent with previous values in the chart.  BP: 96/69 [SJ]    Clinical Course User Index [SJ] Obadiah Dennard, Hillard Danker, PA-C   MDM Rules/Calculators/A&P                          Patient presents with persistent abdominal pain.  This pain has moved and localized to the right lower quadrant. Patient is nontoxic appearing, afebrile, not tachycardic, not tachypneic, not hypotensive, maintains excellent SPO2 on room air. She is in apparent pain and discomfort on exam.  She is quite tender in the right lower quadrant.  I have reviewed the patient's chart to obtain more  information.   Patient's pain was constant at the time of her pelvic ultrasound morning of 7/31 and the ultrasound did not show any ovarian blood flow abnormalities.  This significantly lowers my suspicion for torsion.  I reviewed and interpreted the patient's labs and radiological studies. No leukocytosis.  Urine without any abnormalities. CT with evidence of enteritis and mesenteric adenitis.  I do think this fits the patient's clinical picture.    I had a long discussion patient regarding management of her pain, especially with her history.  She voices understanding that the treatment for mesenteric adenitis is NSAIDs, however, she is concerned about the amount of pain she is experiencing. Her significant other was present in the room for this discussion.  Shared decision making discussion was had regarding short course of oral opiates for management of her pain.  The patient was given instructions for home care as well as return precautions. Patient voices understanding of these instructions, accepts the plan, and is comfortable with discharge.  Findings and plan of care discussed with Cherlynn Perches, MD.   Patient was seen at the  [facility] ED in [facility] 7/30.  I reviewed the provider notes and lab and imaging results.  Here the pertinent findings: Pelvic exam was performed with no noted CMT.  GC/chlamydia was negative.  Wet prep was negative. Ultrasound showed simple ovarian cyst with good ovarian blood flow. Noncontrast abdominal CT was unremarkable, however, appendix was not visualized.   Vitals:   04/18/20 1215 04/18/20 1430 04/18/20 1445 04/18/20 1512  BP: 101/65 100/67 111/81   Pulse: 76 79    Resp: 16 16    Temp:    97.9 F (36.6 C)  TempSrc:    Oral  SpO2: 99% 99%    Weight:      Height:          Final Clinical Impression(s) / ED Diagnoses Final diagnoses:  Mesenteric adenitis    Rx / DC Orders ED Discharge Orders         Ordered    oxyCODONE-acetaminophen  (PERCOCET/ROXICET) 5-325 MG tablet  Every 6 hours PRN     Discontinue  Reprint     04/18/20 1506    ondansetron (ZOFRAN ODT) 4 MG disintegrating tablet  Every 8 hours PRN     Discontinue  Reprint     04/18/20 1506           Deepti Gunawan, Hillard Danker, PA-C 04/18/20 1727    Sabino Donovan, MD 04/18/20 2111

## 2020-05-21 ENCOUNTER — Other Ambulatory Visit: Payer: Self-pay | Admitting: Physician Assistant

## 2020-05-21 DIAGNOSIS — G44229 Chronic tension-type headache, not intractable: Secondary | ICD-10-CM

## 2020-05-21 DIAGNOSIS — J310 Chronic rhinitis: Secondary | ICD-10-CM

## 2020-06-02 ENCOUNTER — Other Ambulatory Visit: Payer: Self-pay

## 2020-06-02 ENCOUNTER — Ambulatory Visit
Admission: RE | Admit: 2020-06-02 | Discharge: 2020-06-02 | Disposition: A | Discharge status: Home / Self Care | Payer: Medicaid Other | Source: Ambulatory Visit | Attending: Physician Assistant | Admitting: Physician Assistant

## 2020-06-02 DIAGNOSIS — G44229 Chronic tension-type headache, not intractable: Secondary | ICD-10-CM

## 2020-06-02 DIAGNOSIS — J310 Chronic rhinitis: Secondary | ICD-10-CM

## 2020-06-03 ENCOUNTER — Emergency Department (HOSPITAL_COMMUNITY)
Admission: EM | Admit: 2020-06-03 | Discharge: 2020-06-04 | Disposition: A | Discharge status: Home / Self Care | Payer: Medicaid Other | Attending: Emergency Medicine | Admitting: Emergency Medicine

## 2020-06-03 ENCOUNTER — Encounter (HOSPITAL_COMMUNITY): Payer: Self-pay

## 2020-06-03 DIAGNOSIS — R1030 Lower abdominal pain, unspecified: Secondary | ICD-10-CM | POA: Diagnosis not present

## 2020-06-03 DIAGNOSIS — R110 Nausea: Secondary | ICD-10-CM | POA: Diagnosis not present

## 2020-06-03 DIAGNOSIS — F17210 Nicotine dependence, cigarettes, uncomplicated: Secondary | ICD-10-CM | POA: Insufficient documentation

## 2020-06-03 DIAGNOSIS — R1031 Right lower quadrant pain: Secondary | ICD-10-CM | POA: Insufficient documentation

## 2020-06-03 LAB — COMPREHENSIVE METABOLIC PANEL
ALT: 17 U/L (ref 0–44)
AST: 18 U/L (ref 15–41)
Albumin: 4.3 g/dL (ref 3.5–5.0)
Alkaline Phosphatase: 87 U/L (ref 38–126)
Anion gap: 10 (ref 5–15)
BUN: 20 mg/dL (ref 6–20)
CO2: 26 mmol/L (ref 22–32)
Calcium: 9.1 mg/dL (ref 8.9–10.3)
Chloride: 103 mmol/L (ref 98–111)
Creatinine, Ser: 0.8 mg/dL (ref 0.44–1.00)
GFR calc Af Amer: >60 mL/min (ref >60)
GFR calc non Af Amer: >60 mL/min (ref >60)
Glucose, Bld: 126 mg/dL — ABNORMAL HIGH (ref 70–99)
Potassium: 3.6 mmol/L (ref 3.5–5.1)
Sodium: 139 mmol/L (ref 135–145)
Total Bilirubin: 1.5 mg/dL — ABNORMAL HIGH (ref 0.3–1.2)
Total Protein: 7.1 g/dL (ref 6.5–8.1)

## 2020-06-03 LAB — CBC
HCT: 38.9 % (ref 36.0–46.0)
Hemoglobin: 12.6 g/dL (ref 12.0–15.0)
MCH: 31 pg (ref 26.0–34.0)
MCHC: 32.4 g/dL (ref 30.0–36.0)
MCV: 95.8 fL (ref 80.0–100.0)
Platelets: 270 10*3/uL (ref 150–400)
RBC: 4.06 MIL/uL (ref 3.87–5.11)
RDW: 12 % (ref 11.5–15.5)
WBC: 8.3 10*3/uL (ref 4.0–10.5)
nRBC: 0 % (ref 0.0–0.2)

## 2020-06-03 LAB — I-STAT BETA HCG BLOOD, ED (MC, WL, AP ONLY): I-stat hCG, quantitative: <5 m[IU]/mL (ref <5)

## 2020-06-03 LAB — LIPASE, BLOOD: Lipase: 33 U/L (ref 11–51)

## 2020-06-03 NOTE — ED Triage Notes (Signed)
Pt arrives to ED w/ c/o 10/10 RLQ abdominal pain that started at 0300 today. Pt seen here recently for ovarian cyst. Pt endorses n/v, and tenderness at RLQ.

## 2020-06-04 ENCOUNTER — Emergency Department (HOSPITAL_COMMUNITY): Payer: Medicaid Other

## 2020-06-04 LAB — URINALYSIS, ROUTINE W REFLEX MICROSCOPIC
Bilirubin Urine: NEGATIVE
Glucose, UA: NEGATIVE mg/dL
Hgb urine dipstick: NEGATIVE
Ketones, ur: NEGATIVE mg/dL
Leukocytes,Ua: NEGATIVE
Nitrite: NEGATIVE
Protein, ur: NEGATIVE mg/dL
Specific Gravity, Urine: 1.029 (ref 1.005–1.030)
pH: 5 (ref 5.0–8.0)

## 2020-06-04 MED ORDER — ONDANSETRON 4 MG PO TBDP: 4.0000 mg | ORAL_TABLET | Freq: Once | ORAL | Status: DC

## 2020-06-04 MED ORDER — METOCLOPRAMIDE HCL 10 MG PO TABS: 5.0000 mg | ORAL_TABLET | Freq: Once | ORAL | Status: DC

## 2020-06-04 MED ORDER — ACETAMINOPHEN 500 MG PO TABS: 1000.0000 mg | ORAL_TABLET | Freq: Once | ORAL | Status: DC

## 2020-06-04 MED ORDER — HYDROCODONE-ACETAMINOPHEN 5-325 MG PO TABS
1.0000 | ORAL_TABLET | Freq: Once | ORAL | Status: AC
  Administered 2020-06-04: 1 via ORAL
  Filled 2020-06-04: qty 1

## 2020-06-04 MED ORDER — ACETAMINOPHEN 325 MG PO TABS
650.0000 mg | ORAL_TABLET | Freq: Once | ORAL | Status: AC
  Administered 2020-06-04: 650 mg via ORAL
  Filled 2020-06-04: qty 2

## 2020-06-04 MED ORDER — ONDANSETRON HCL 4 MG PO TABS
4.0000 mg | ORAL_TABLET | Freq: Once | ORAL | Status: AC
  Administered 2020-06-04: 4 mg via ORAL
  Filled 2020-06-04: qty 1

## 2020-06-04 NOTE — ED Provider Notes (Signed)
[facility] EMERGENCY DEPARTMENT Provider Note   CSN: [id number] Arrival date & time: 06/03/20  2131     History Chief Complaint  Patient presents with  . Tonya Davidson    Tonya Davidson is a 29 y.o. female has medical history of bipolar 1 disorder, substance abuse, PID, renal colic, interstitial cystitis, mesenteric adenitis presents the ED with complaint of right lower quadrant Davidson.  She states this started relatively suddenly approximately 36 hours prior to presentation, initially colicky and then became constant nature 10/10 in severity for which she presented yesterday and given long wait times has been in the waiting room of the last 18 hours.  States that Davidson has not improved at all, is associated with some nausea and she is providing heat with some improvement.  On chart review the patient has presented with similar complaints many times in the last 2 years, has received numerous imaging studies of the abdomen and pelvis including multiple CT scans with contrast, has been multiple diagnoses most, which seems to be mesenteric adenitis which is nonspecific as well as ovarian cyst.  The history is provided by the patient.  Tonya Davidson Davidson location:  RLQ Davidson quality: cramping and sharp   Davidson radiates to:  R leg and suprapubic region Davidson severity:  Severe Onset quality:  Sudden Duration:  36 hours Timing:  Constant Progression:  Unchanged Chronicity:  New Context: awakening from sleep   Associated symptoms: nausea   Associated symptoms: no chest Davidson, no chills, no cough, no dysuria, no fever, no shortness of breath and no vomiting        Past Medical History:  Diagnosis Date  . Abnormal Pap smear    colposcopy  . Anemia   . Anxiety   . Arthritis   . Bipolar 1 disorder (HCC)   . C. difficile diarrhea   . Chlamydia 04/29/2012  . Chronic Davidson   . Depression   . Drug-seeking behavior   . Fibromyalgia   . HPV in female   . IBS (irritable  bowel syndrome)   . Interstitial cystitis   . Kidney stones   . Mononucleosis   . Ovarian cyst 01/2019   right   . Pelvic Davidson in female 12/14/2014  . PID (acute pelvic inflammatory disease) 04/28/2012  . Ulcer     Patient Active Problem List   Diagnosis Date Noted  . Pelvic Davidson affecting pregnancy 02/13/2019  . Diarrhea 02/13/2019  . Elevated bilirubin 02/12/2019  . Polysubstance abuse (HCC) 10/13/2018  . 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 Pyelonephritis 09/15/2016  . Right flank Davidson   . Pelvic Davidson in female 12/14/2014  . Severe episode of recurrent major depressive disorder (HCC) 10/03/2013  . GAD (generalized anxiety disorder) 09/30/2013  . PID (acute pelvic inflammatory disease) 04/28/2012  . ANXIETY 05/18/2009  . IBS 05/18/2009    Past Surgical History:  Procedure Laterality Date  . COLPOSCOPY    . INNER EAR SURGERY    . KIDNEY STONE SURGERY    . LAPAROSCOPIC CHOLECYSTECTOMY    . TONSILLECTOMY    . WISDOM TOOTH EXTRACTION       OB History    Gravida  1   Para      Term      Preterm      AB      Living        SAB      TAB  Ectopic      Multiple      Live Births              Family History  Problem Relation Age of Onset  . Bipolar disorder Mother   . Cancer Father   . ADD / ADHD Brother   . Cancer Maternal Grandmother   . Cancer Maternal Grandfather   . Cancer Paternal Grandmother   . Cancer Paternal Grandfather     Social History   Tobacco Use  . Smoking status: Current Every Day Smoker    Packs/day: 0.50    Years: 5.00    Pack years: 2.50    Types: Cigarettes  . Smokeless tobacco: Never Used  Substance Use Topics  . Alcohol use: No  . Drug use: Not Currently    Types: Marijuana    Comment: no tused for 1 year    Home Medications Prior to Admission medications   Medication Sig Start Date End Date Taking? Authorizing Provider  acetaminophen (TYLENOL) 325 MG  tablet Take 2 tablets (650 mg total) by mouth every 6 (six) hours as needed for mild Davidson or moderate Davidson. (May buy over the counter) Patient not taking: Reported on 03/28/2019 10/15/18   Aldean Baker, NP  acetaminophen (TYLENOL) 500 MG tablet Take 1,000 mg by mouth every 6 (six) hours as needed for mild Davidson.    [provider]  aspirin-acetaminophen-caffeine (EXCEDRIN MIGRAINE) [phone] MG tablet Take 1 tablet by mouth every 6 (six) hours as needed for headache. 12/02/19   Garlon Hatchet, PA-C  cephALEXin (KEFLEX) 500 MG capsule 1 cap po bid x 14 days Patient not taking: Reported on 07/25/2019 04/01/19   Palumbo, April, MD  clonazePAM (KLONOPIN) 0.5 MG tablet Take 1 tablet (0.5 mg total) by mouth 2 (two) times daily as needed (anxiety or insomnia). Patient not taking: Reported on 07/25/2019 10/15/18   Aldean Baker, NP  cyclobenzaprine (FLEXERIL) 10 MG tablet Take 1 tablet (10 mg total) by mouth 2 (two) times daily as needed (jaw Davidson). 12/02/19   Garlon Hatchet, PA-C  dicyclomine (BENTYL) 20 MG tablet Take 1 tablet (20 mg total) by mouth 2 (two) times daily. Patient not taking: Reported on 03/28/2019 11/18/18   Garlon Hatchet, PA-C  Doxylamine-Pyridoxine 10-10 MG TBEC Take 2 tablets by mouth at bedtime as needed (for nausea). Patient not taking: Reported on 07/25/2019 02/20/19   Antony Madura, PA-C  Elastic Bandages & Supports (COMFORT FIT MATERNITY SUPP SM) MISC 1 Units by Does not apply route daily as needed. 07/08/19   Nugent, Odie Sera, NP  hydrOXYzine (ATARAX/VISTARIL) 25 MG tablet Take 1 tablet (25 mg total) by mouth every 6 (six) hours as needed (anxiety/agitation or CIWA < or = 10). Patient not taking: Reported on 03/28/2019 10/15/18   Aldean Baker, NP  ibuprofen (ADVIL) 200 MG tablet Take 800 mg by mouth every 6 (six) hours as needed for moderate Davidson.    [provider]  lidocaine (LIDODERM) 5 % Place 1 patch onto the skin daily. Remove & Discard patch within 12 hours or as  directed by MD Patient not taking: Reported on 12/02/2019 07/25/19   Palumbo, April, MD  naproxen sodium (ALEVE) 220 MG tablet Take 220 mg by mouth daily as needed (Davidson).    [provider]  nitrofurantoin, macrocrystal-monohydrate, (MACROBID) 100 MG capsule Take 1 capsule (100 mg total) by mouth 2 (two) times daily. X 7 days Patient not taking: Reported on 12/02/2019 07/25/19  Palumbo, April, MD  ondansetron (ZOFRAN ODT) 4 MG disintegrating tablet Take 1 tablet (4 mg total) by mouth every 8 (eight) hours as needed for nausea or vomiting. 04/18/20   Joy, Shawn C, PA-C  oxyCODONE-acetaminophen (PERCOCET/ROXICET) 5-325 MG tablet Take 1 tablet by mouth every 6 (six) hours as needed for severe Davidson. 04/18/20   Joy, Hillard Danker, PA-C  Prenatal Vit-Fe Fumarate-FA (PRENATAL MULTIVITAMIN) TABS tablet Take 1 tablet by mouth daily at 12 noon.    [provider]  simethicone (GAS-X) 80 MG chewable tablet Chew 1 tablet (80 mg total) by mouth every 6 (six) hours as needed for flatulence. Patient not taking: Reported on 03/28/2019 02/13/19   Aviva Signs, CNM    Allergies    Azithromycin, Doxycycline, Prednisone, Bactrim [sulfamethoxazole-trimethoprim], Flagyl [metronidazole], and Toradol [ketorolac tromethamine]  Review of Systems   Review of Systems  Constitutional: Negative for chills and fever.  HENT: Negative for facial swelling and voice change.   Eyes: Negative for redness and visual disturbance.  Respiratory: Negative for cough and shortness of breath.   Cardiovascular: Negative for chest Davidson and palpitations.  Gastrointestinal: Positive for Tonya Davidson and nausea. Negative for vomiting.  Genitourinary: Negative for difficulty urinating and dysuria.  Musculoskeletal: Negative for gait problem and joint swelling.  Skin: Negative for rash and wound.  Neurological: Negative for dizziness and headaches.  Psychiatric/Behavioral: Negative for confusion and suicidal ideas.    Physical  Exam Updated Vital Signs BP 104/68 (BP Location: Right Arm)   Pulse 84   Temp 98.2 F (36.8 C) (Oral)   Resp 16   SpO2 100%   Physical Exam Constitutional:      General: She is not in acute distress. HENT:     Head: Normocephalic and atraumatic.     Mouth/Throat:     Mouth: Mucous membranes are moist.     Pharynx: Oropharynx is clear.  Eyes:     General: No scleral icterus.    Pupils: Pupils are equal, round, and reactive to light.  Cardiovascular:     Rate and Rhythm: Normal rate and regular rhythm.     Pulses: Normal pulses.  Pulmonary:     Effort: Pulmonary effort is normal. No respiratory distress.  Tonya:     General: There is no distension.     Tenderness: There is Tonya tenderness in the right lower quadrant and suprapubic area. There is no right CVA tenderness or left CVA tenderness. Negative signs include Rovsing's sign, McBurney's sign, psoas sign and obturator sign.  Musculoskeletal:        General: No tenderness or deformity.     Cervical back: Normal range of motion and neck supple.  Neurological:     General: No focal deficit present.     Mental Status: She is alert and oriented to person, place, and time.  Psychiatric:        Mood and Affect: Mood normal.        Behavior: Behavior normal.     ED Results / Procedures / Treatments   Labs (all labs ordered are listed, but only abnormal results are displayed) Labs Reviewed  COMPREHENSIVE METABOLIC PANEL - Abnormal; Notable for the following components:      Result Value   Glucose, Bld 126 (*)    Total Bilirubin 1.5 (*)    All other components within normal limits  URINALYSIS, ROUTINE W REFLEX MICROSCOPIC - Abnormal; Notable for the following components:   APPearance HAZY (*)    All other components  within normal limits  LIPASE, BLOOD  CBC  I-STAT BETA HCG BLOOD, ED ([facility], [facility], [facility] ONLY)    EKG None  Radiology US PELVIC COMPLETE W TRANSVAGINAL AND TORSION R/O  Result Date:  06/04/2020 CLINICAL DATA:  Right lower quadrant pelvic Davidson 3 p.m. today EXAM: TRANSABDOMINAL AND TRANSVAGINAL ULTRASOUND OF PELVIS DOPPLER ULTRASOUND OF OVARIES TECHNIQUE: Both transabdominal and transvaginal ultrasound examinations of the pelvis were performed. Transabdominal technique was performed for global imaging of the pelvis including uterus, ovaries, adnexal regions, and pelvic cul-de-sac. It was necessary to proceed with endovaginal exam following the transabdominal exam to visualize the ovaries. Color and duplex Doppler ultrasound was utilized to evaluate blood flow to the ovaries. COMPARISON:  ultrasound pelvis 04/01/2019. CT abdomen pelvis 11/18/2018 FINDINGS: Uterus Measurements: 8.7 x 4.1 x 4.4 cm = volume: 82 mL. No fibroids or other mass visualized. Endometrium Thickness: 12 mm.  No focal abnormality visualized. Right ovary Measurements: 3.3 x 2.2 x 2.8 cm = volume: 11 mL. Normal appearance/no adnexal mass. A dominant follicle is identified within the right ovary. Left ovary Measurements: 2.6 x 2 x 2 cm = volume: 5.5 mL. Normal appearance/no adnexal mass. Pulsed Doppler evaluation of both ovaries demonstrates normal low-resistance arterial and venous waveforms. Other findings No abnormal free fluid. IMPRESSION: Unremarkable pelvic ultrasound. Electronically Signed   By: Tish Frederickson M.D.   On: 06/04/2020 18:47    Procedures Procedures (including critical care time)  Medications Ordered in ED Medications  acetaminophen (TYLENOL) tablet 650 mg (650 mg Oral Given 06/04/20 1800)  HYDROcodone-acetaminophen (NORCO/VICODIN) 5-325 MG per tablet 1 tablet (1 tablet Oral Given 06/04/20 1800)  ondansetron (ZOFRAN) tablet 4 mg (4 mg Oral Given 06/04/20 1800)    ED Course  I have reviewed the triage vital signs and the nursing notes.  Pertinent labs & imaging results that were available during my care of the patient were reviewed by me and considered in my medical decision making (see chart for  details).    MDM Rules/Calculators/A&P                           Differential diagnosis considered: Appendicitis, ovarian torsion, ovarian cyst, renal colic, pyelonephritis, bowel distraction, intra-Tonya abscess or infection, mesenteric adenitis  Patient presents to ED with right lower quadrant Davidson that started approximately 36 hours prior to rotation initially colicky and now constant, sharp and severe.  Exam is significant for right lower quadrant tenderness with some lesser suprapubic tenderness, no significant rebound.  Her vitals are notably within normal limits and triage labs reviewed by myself, entirely unremarkable, no blood in the urine making renal colic less likely, no significant leukocytosis and the absence of fevers makes infectious etiologies somewhat less likely.  Given the patient has had numerous CT studies of the same region for similar complaints, feel that the likely came with a risk of these multiple CT studies is concerning and without leukocytosis or fevers, have a very low suspicion for appendicitis at this time.  Given that she has had ovarian cysts diagnosed multiple thrombotically on that side, she is at high risk for torsion and will obtain pelvic ultrasound.  Pelvic ultrasound within normal limits, exam remains reassuring with some although small amount of improvement with Norco and Tylenol.  Reassuring work-up and exam, numerous prior exams and reassuring imaging, I feel the patient stable for discharge home.  Very strict return precautions including fevers, severe worsening of Davidson, peritonitis explained to the patient expressed  understanding and agreement.  Counseled to follow closely with PCP for reassessment and she expressed understanding.  Labs reviewed and interpreted by myself with significant findings above. Imaging reviewed by myself and interpreted by radiologist.  Case and plan above discussed with my attending Dr. Particia NearingHaviland  Final Clinical  Impression(s) / ED Diagnoses Final diagnoses:  RLQ Tonya Davidson    Rx / DC Orders ED Discharge Orders    None     Labs, studies and imaging reviewed by myself and considered in medical decision making if ordered. Imaging interpreted by radiology. Pt was discussed with my attending, Dr. Particia NearingHaviland  Electronically signed by:  Christiane HaJonathan Redding9/17/20218:05 PM       Tonya Davidson, Maher Shon, MD 06/04/20 Gillis Santa2005    Haviland, Julie, MD 06/04/20 2047

## 2021-04-30 IMAGING — MR MRI PELVIS WITHOUT CONTRAST
15 of 19 series · 33 of 48 positions shown · non-contrast
Comparison: Pelvic ultrasound 02/16/2019

CLINICAL DATA: Right lower quadrant pain.  Eleven weeks pregnant.

EXAM:
MRI ABDOMEN AND PELVIS WITHOUT CONTRAST
TECHNIQUE: Multiplanar multisequence MR imaging of the abdomen and pelvis was
performed. No intravenous contrast was administered.

[Series 3: cor haste · coronal · 4.0mm · 1.12mm/px · 1 of 34 slices shown (1 of 2)]
[im 1/34]
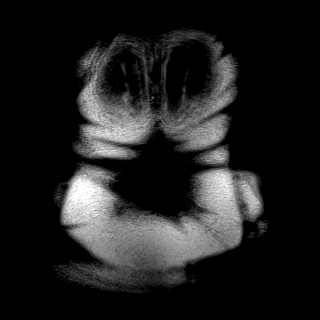

[Series 4: cor haste fs · coronal · 4.0mm · 1.12mm/px · 1 of 34 slices shown (1 of 2)]
[im 1/34]
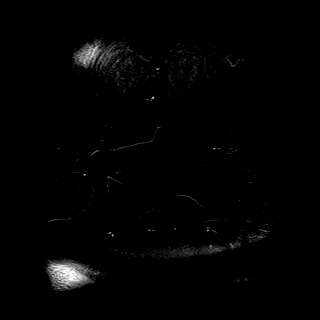

[Series 5: bSSFP · coronal · 4.0mm · 0.70mm/px · 2 of 34 slices shown (1 of 5)]
[im 1/34]
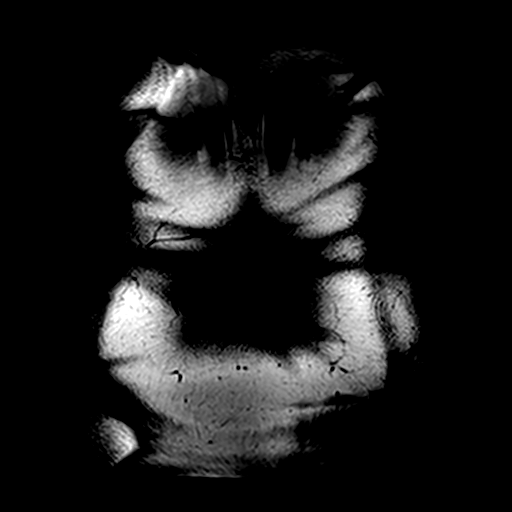
[im 34/34]
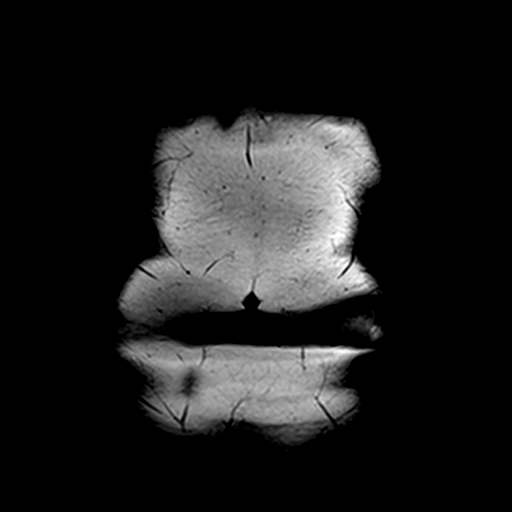

[Series 6: cor haste fs · coronal · 4.0mm · 1.12mm/px · 2 of 34 slices shown (2 of 2)]
[im 1/34]
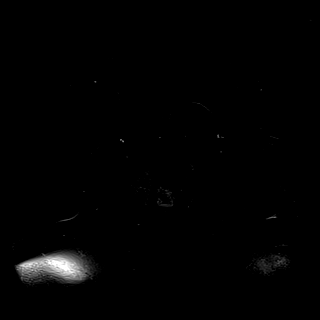
[im 34/34]
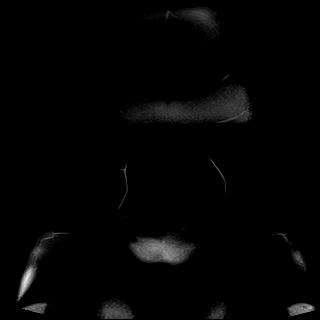

[Series 7: cor haste · coronal · 4.0mm · 1.12mm/px · 2 of 34 slices shown (2 of 2)]
[im 1/34]
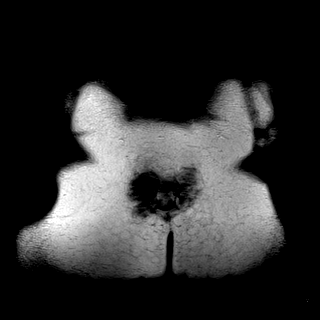
[im 34/34]
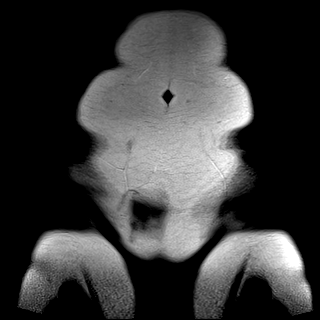

[Series 8: bSSFP · coronal · 4.0mm · 0.70mm/px · 2 of 34 slices shown (2 of 5)]
[im 1/34]
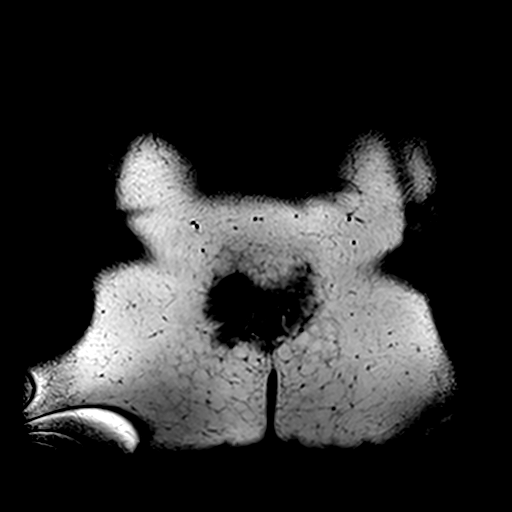
[im 34/34]
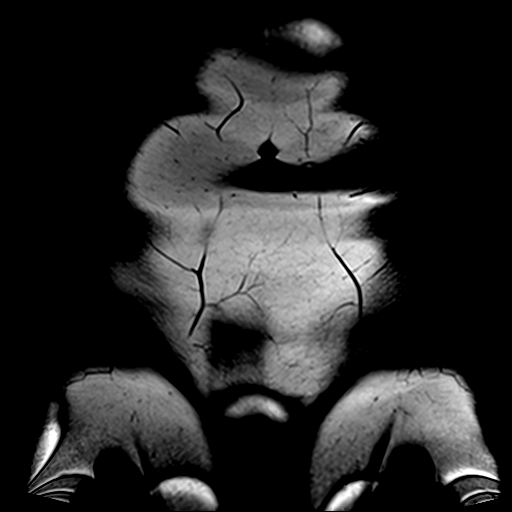

[Series 9: ax haste · axial · 5.0mm · 1.19mm/px · z∈[-181,+17]mm · 2 of 34 slices shown (1 of 2)]
[im 1/34]
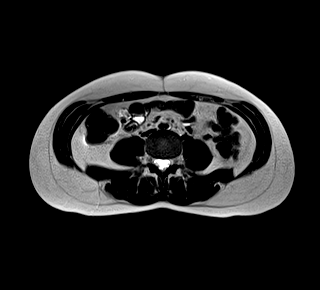
[im 34/34]
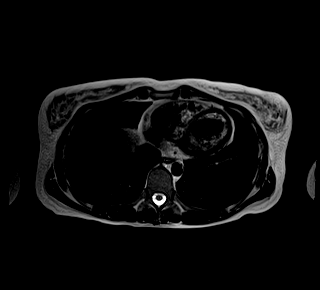

[Series 10: ax haste · axial · 5.0mm · 1.19mm/px · z∈[-390,-180]mm · 2 of 36 slices shown (2 of 2)]
[im 1/36]
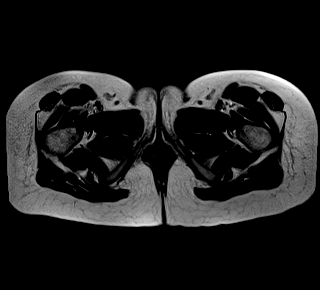
[im 36/36]
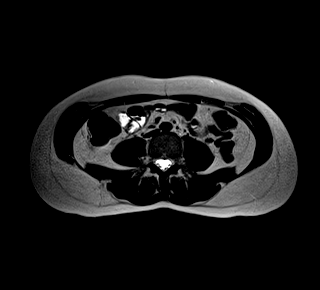

[Series 11: ax haste_comp · axial · 5.0mm · 1.19mm/px · z∈[-390,+17]mm · 4 of 68 slices shown]
[im 1/68]
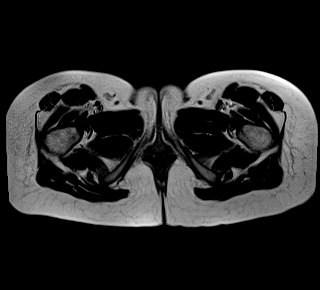
[im 23/68]
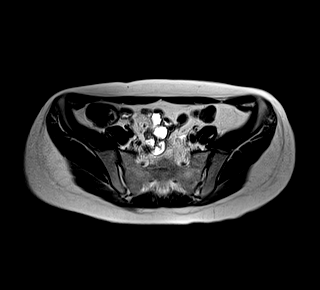
[im 45/68]
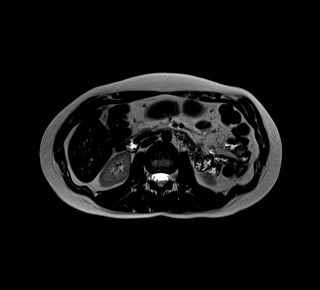
[im 68/68]
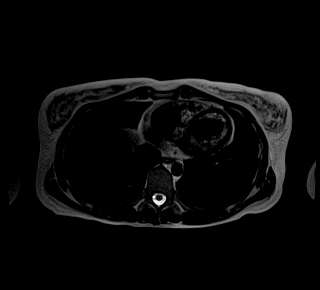

[Series 13: T2 fat-sat · axial · 5.0mm · 1.06mm/px · z∈[-181,+17]mm · 2 of 34 slices shown (1 of 3)]
[im 1/34]
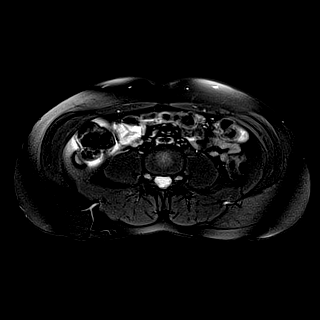
[im 34/34]
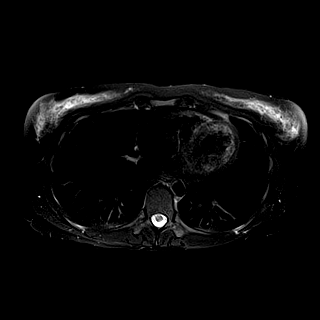

[Series 15: T2 fat-sat · axial · 5.0mm · 1.06mm/px · z∈[-390,-180]mm · 2 of 36 slices shown (2 of 3)]
[im 1/36]
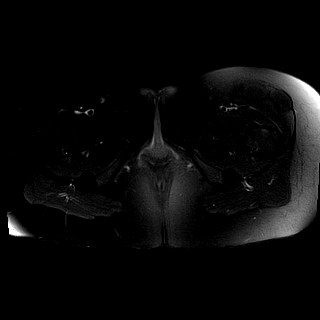
[im 36/36]
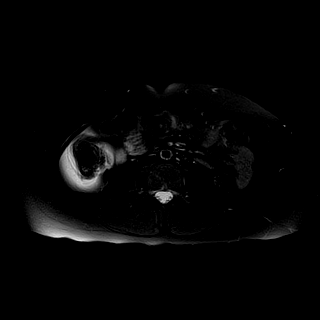

[Series 16: T2 fat-sat · axial · 5.0mm · 1.06mm/px · z∈[-390,+17]mm · 4 of 68 slices shown (3 of 3)]
[im 1/68]
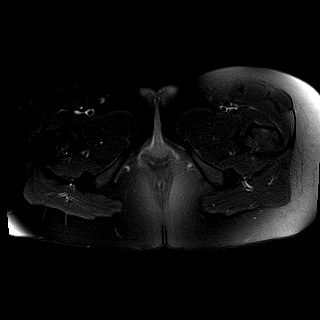
[im 23/68]
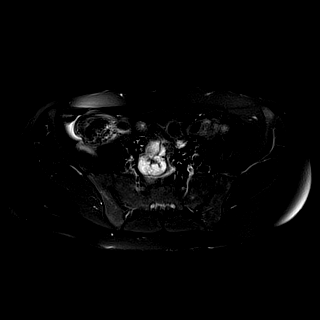
[im 45/68]
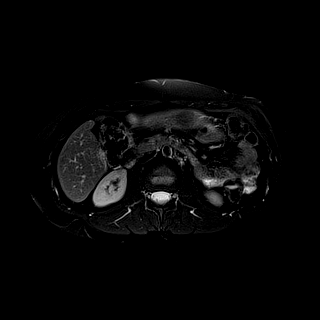
[im 68/68]
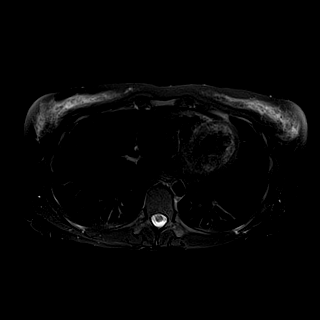

[Series 17: bSSFP · axial · 5.0mm · 0.66mm/px · z∈[-181,+17]mm · 2 of 34 slices shown (3 of 5)]
[im 1/34]
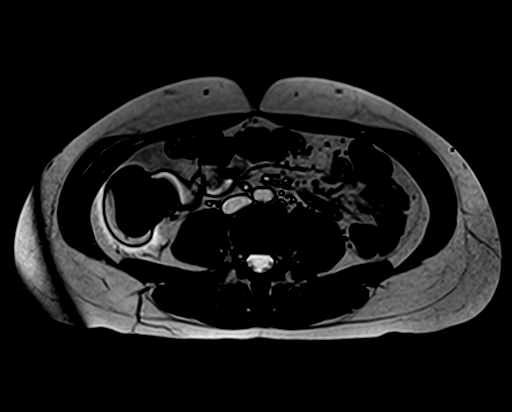
[im 34/34]
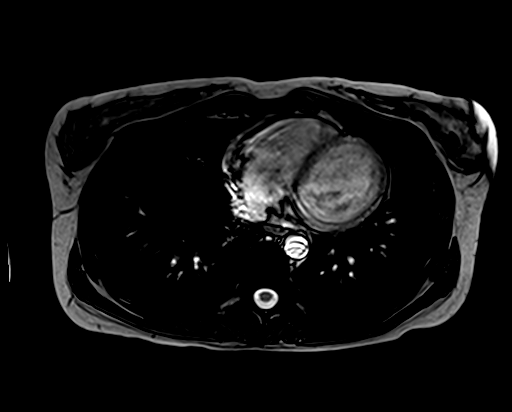

[Series 18: bSSFP · axial · 5.0mm · 0.66mm/px · z∈[-390,-180]mm · 2 of 36 slices shown (4 of 5)]
[im 1/36]
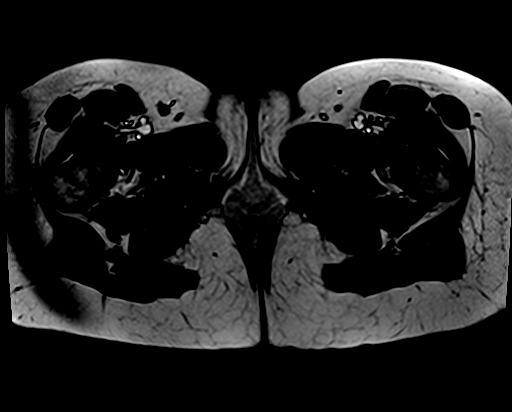
[im 36/36]
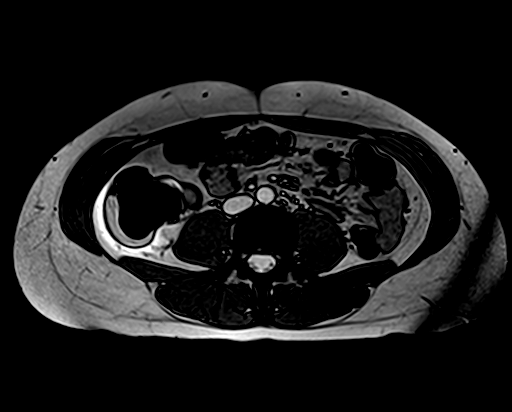

[Series 19: bSSFP · axial · 5.0mm · 0.66mm/px · z∈[-390,-121]mm · 3 of 68 slices shown (5 of 5)]
[im 1/68]
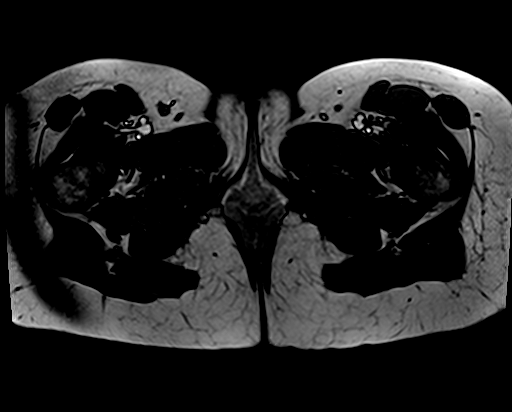
[im 23/68]
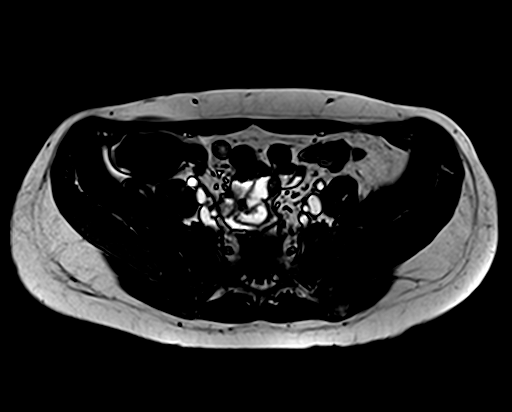
[im 45/68]
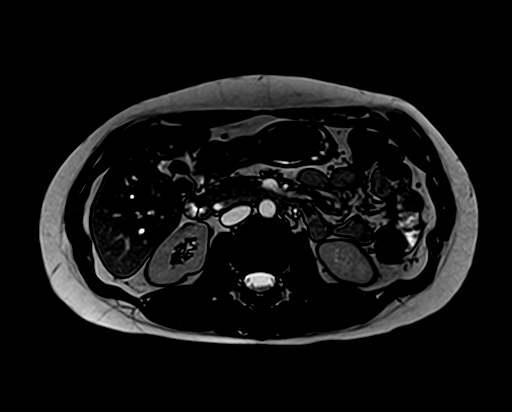

[33 of 48 positions shown; findings below may reference images not displayed]

FINDINGS: COMBINED FINDINGS FOR BOTH MR ABDOMEN AND PELVIS

Lower chest: Normal

Hepatobiliary: Liver signal is normal. No biliary dilatation. The
gallbladder is absent. Normal common bile duct.

Pancreas: Normal pancreatic signal and contours. Normal pancreatic
duct.

Spleen:  Normal

Adrenals/Urinary Tract: Normal adrenal glands and kidneys.
Unremarkable urinary bladder.

Stomach/Bowel: There is a large amount of gas in the cecum and
ascending colon, causing magnetic susceptibility effect and
degrading fat suppression. The appendix is not visualized. No
dilated small bowel. No visible right lower quadrant inflammation.

Vascular/Lymphatic:  Normal

Reproductive: Early intrauterine pregnancy. There is an 11 mm cyst
near the lower pole of the right ovary that appears in continuity
with small amount of right pelvic fluid tracking into the posterior
cul-de-sac. There is mild right ovarian edema.

Other:  None

Musculoskeletal: Normal marrow signal.
IMPRESSION: 1. Nonvisualization of the appendix. The right lower quadrant is
partially obscured by magnetic susceptibility effect from gas within
the cecum and ascending colon.
2. Small right ovarian cyst in continuity with fluid tracking along
the right pelvic sidewall into the posterior cul-de-sac, with mild
right ovarian edema, possibly indicating recently ruptured ovarian
cyst.
3. Early intrauterine pregnancy.

## 2021-05-12 ENCOUNTER — Ambulatory Visit: Payer: 59 | Admitting: Obstetrics and Gynecology

## 2021-06-03 ENCOUNTER — Ambulatory Visit: Payer: 59 | Admitting: Obstetrics and Gynecology

## 2021-06-03 DIAGNOSIS — Z0289 Encounter for other administrative examinations: Secondary | ICD-10-CM

## 2021-07-22 ENCOUNTER — Ambulatory Visit: Payer: Medicaid Other | Admitting: Obstetrics and Gynecology
